# Patient Record
Sex: Female | Born: 1975 | Race: Black or African American | Hispanic: No | Marital: Single | Smoking: Never smoker
Health system: Southern US, Community
[De-identification: ages and names within clinical notes are randomized; demographics above are authoritative.]

## PROBLEM LIST (undated history)

## (undated) DIAGNOSIS — D259 Leiomyoma of uterus, unspecified: Secondary | ICD-10-CM

## (undated) DIAGNOSIS — O24419 Gestational diabetes mellitus in pregnancy, unspecified control: Secondary | ICD-10-CM

## (undated) DIAGNOSIS — K5790 Diverticulosis of intestine, part unspecified, without perforation or abscess without bleeding: Secondary | ICD-10-CM

## (undated) DIAGNOSIS — Z5189 Encounter for other specified aftercare: Secondary | ICD-10-CM

## (undated) DIAGNOSIS — E119 Type 2 diabetes mellitus without complications: Secondary | ICD-10-CM

## (undated) DIAGNOSIS — D649 Anemia, unspecified: Secondary | ICD-10-CM

## (undated) HISTORY — DX: Diverticulosis of intestine, part unspecified, without perforation or abscess without bleeding: K57.90

## (undated) HISTORY — PX: CERVICAL CERCLAGE: SHX1329

## (undated) HISTORY — DX: Type 2 diabetes mellitus without complications: E11.9

## (undated) HISTORY — PX: OTHER SURGICAL HISTORY: SHX169

## (undated) HISTORY — DX: Encounter for other specified aftercare: Z51.89

## (undated) HISTORY — PX: DILATION AND CURETTAGE OF UTERUS: SHX78

## (undated) HISTORY — PX: TOOTH EXTRACTION: SUR596

---

## 1998-02-13 ENCOUNTER — Other Ambulatory Visit: Admission: RE | Admit: 1998-02-13 | Discharge: 1998-02-13 | Payer: Self-pay | Admitting: Obstetrics

## 1999-04-04 ENCOUNTER — Other Ambulatory Visit: Admission: RE | Admit: 1999-04-04 | Discharge: 1999-04-04 | Payer: Self-pay | Admitting: Obstetrics

## 2000-04-13 ENCOUNTER — Other Ambulatory Visit: Admission: RE | Admit: 2000-04-13 | Discharge: 2000-04-13 | Payer: Self-pay | Admitting: Obstetrics

## 2001-07-22 ENCOUNTER — Inpatient Hospital Stay (HOSPITAL_COMMUNITY): Admission: AD | Admit: 2001-07-22 | Discharge: 2001-07-22 | Payer: Self-pay | Admitting: Obstetrics

## 2003-05-21 ENCOUNTER — Inpatient Hospital Stay (HOSPITAL_COMMUNITY): Admission: AD | Admit: 2003-05-21 | Discharge: 2003-05-21 | Payer: Self-pay | Admitting: Obstetrics

## 2003-06-04 ENCOUNTER — Inpatient Hospital Stay (HOSPITAL_COMMUNITY): Admission: AD | Admit: 2003-06-04 | Discharge: 2003-06-04 | Payer: Self-pay | Admitting: Obstetrics

## 2003-06-15 ENCOUNTER — Inpatient Hospital Stay (HOSPITAL_COMMUNITY): Admission: AD | Admit: 2003-06-15 | Discharge: 2003-06-17 | Payer: Self-pay | Admitting: Obstetrics

## 2003-06-30 ENCOUNTER — Inpatient Hospital Stay (HOSPITAL_COMMUNITY): Admission: AD | Admit: 2003-06-30 | Discharge: 2003-07-02 | Payer: Self-pay | Admitting: Obstetrics

## 2003-08-02 ENCOUNTER — Inpatient Hospital Stay (HOSPITAL_COMMUNITY): Admission: AD | Admit: 2003-08-02 | Discharge: 2003-08-07 | Payer: Self-pay | Admitting: Obstetrics

## 2003-08-06 ENCOUNTER — Encounter (INDEPENDENT_AMBULATORY_CARE_PROVIDER_SITE_OTHER): Payer: Self-pay | Admitting: *Deleted

## 2003-09-19 ENCOUNTER — Ambulatory Visit (HOSPITAL_COMMUNITY): Admission: RE | Admit: 2003-09-19 | Discharge: 2003-09-19 | Payer: Self-pay

## 2005-11-06 ENCOUNTER — Emergency Department (HOSPITAL_COMMUNITY): Admission: EM | Admit: 2005-11-06 | Discharge: 2005-11-07 | Payer: Self-pay | Admitting: Emergency Medicine

## 2006-01-18 ENCOUNTER — Ambulatory Visit (HOSPITAL_COMMUNITY): Admission: RE | Admit: 2006-01-18 | Discharge: 2006-01-18 | Payer: Self-pay | Admitting: Obstetrics

## 2006-05-20 ENCOUNTER — Inpatient Hospital Stay (HOSPITAL_COMMUNITY): Admission: AD | Admit: 2006-05-20 | Discharge: 2006-05-20 | Payer: Self-pay | Admitting: Obstetrics and Gynecology

## 2006-06-25 ENCOUNTER — Ambulatory Visit (HOSPITAL_COMMUNITY): Admission: RE | Admit: 2006-06-25 | Discharge: 2006-06-25 | Payer: Self-pay | Admitting: Obstetrics

## 2006-07-01 ENCOUNTER — Emergency Department (HOSPITAL_COMMUNITY): Admission: EM | Admit: 2006-07-01 | Discharge: 2006-07-01 | Payer: Self-pay | Admitting: Emergency Medicine

## 2006-07-13 ENCOUNTER — Ambulatory Visit (HOSPITAL_COMMUNITY): Admission: RE | Admit: 2006-07-13 | Discharge: 2006-07-13 | Payer: Self-pay | Admitting: Obstetrics

## 2006-10-01 ENCOUNTER — Inpatient Hospital Stay (HOSPITAL_COMMUNITY): Admission: AD | Admit: 2006-10-01 | Discharge: 2006-10-04 | Payer: Self-pay | Admitting: Obstetrics

## 2006-12-25 ENCOUNTER — Inpatient Hospital Stay (HOSPITAL_COMMUNITY): Admission: AD | Admit: 2006-12-25 | Discharge: 2006-12-28 | Payer: Self-pay | Admitting: Obstetrics

## 2006-12-25 ENCOUNTER — Encounter (INDEPENDENT_AMBULATORY_CARE_PROVIDER_SITE_OTHER): Payer: Self-pay | Admitting: Obstetrics

## 2007-01-19 ENCOUNTER — Inpatient Hospital Stay (HOSPITAL_COMMUNITY): Admission: AD | Admit: 2007-01-19 | Discharge: 2007-01-19 | Payer: Self-pay | Admitting: Obstetrics

## 2007-04-19 ENCOUNTER — Ambulatory Visit (HOSPITAL_COMMUNITY): Admission: RE | Admit: 2007-04-19 | Discharge: 2007-04-19 | Payer: Self-pay | Admitting: Obstetrics

## 2009-04-22 ENCOUNTER — Emergency Department (HOSPITAL_COMMUNITY): Admission: EM | Admit: 2009-04-22 | Discharge: 2009-04-22 | Payer: Self-pay | Admitting: Family Medicine

## 2010-01-15 ENCOUNTER — Ambulatory Visit (HOSPITAL_COMMUNITY)
Admission: RE | Admit: 2010-01-15 | Discharge: 2010-01-15 | Payer: Self-pay | Source: Home / Self Care | Attending: Obstetrics | Admitting: Obstetrics

## 2010-03-04 ENCOUNTER — Emergency Department (HOSPITAL_COMMUNITY)
Admission: EM | Admit: 2010-03-04 | Discharge: 2010-03-04 | Payer: Self-pay | Source: Home / Self Care | Admitting: Emergency Medicine

## 2010-06-17 NOTE — Discharge Summary (Signed)
[name], [name]               ACCOUNT NO.:  [id number]   MEDICAL RECORD NO.:  [id number]          PATIENT TYPE:  INP   LOCATION:  9101                          [facility]ILITY[facility]  [facility]   PHYSICIAN:  Kathreen Cosier, M.D.DATE OF BIRTH:  1975/08/07   DATE OF ADMISSION:  12/25/2006  DATE OF DISCHARGE:  12/28/2006                               DISCHARGE SUMMARY   HISTORY OF PRESENT ILLNESS:  The patient is a 35 year old gravida 4,  para 1-0-3-1, [facility] January 04, 2007.  She had a cerclage during this  pregnancy and she also had gestational diabetes.  She was on glyburide  2.5 h.s. her sugars were normal.  She was brought in for induction  because of her diabetes.  Cervix was 2-3 cm, 70% and vertex floating.  She was started on low-dose Pitocin and it was noted that she started  having the decelerations that ranged between 80-120.  She was delivered  by C-section.  Her cord pH was 7.06 and her baby weight 6 pounds 14  ounces from the OP position.  Female Apgar 2 and 7.  The placenta was  fundal and sent to pathology.  She had large posterior myoma.  The  patient did well postoperatively.  On day two, her CBG was 119 and she  was discharged home on postoperative day #3, ambulatory, on a regular  diet, to see me in one week for a CBG check.  Her hemoglobin was 10.4.  She was discharged home on the third postoperative day on ferrous  sulfate and Tylox.   DISCHARGE DIAGNOSIS:  Status post primary low transverse cesarean  section because of nonreassuring fetal heart rate tracing.           ______________________________  Kathreen Cosier, M.D.     BAM/MEDQ  D:  01/12/2007  T:  01/12/2007  Job:  [id number]

## 2010-06-17 NOTE — Op Note (Signed)
[name], [name] NO.:  [id number]   MEDICAL RECORD NO.:  [id number]          PATIENT TYPE:  INP   LOCATION:  9101                          [facility]   PHYSICIAN:  Kathreen Cosier, M.D.DATE OF BIRTH:  1975/07/23   DATE OF PROCEDURE:  12/25/2006  DATE OF DISCHARGE:                               OPERATIVE REPORT   PREOPERATIVE DIAGNOSES:  1. Failure to progress in labor.  2. Slow fetal heart rate prior to the C-section.   POSTOPERATIVE DIAGNOSES:  1. Failure to progress in labor.  2. Slow fetal heart rate prior to the C-section.  3. Large posterior myoma.   SURGEON:  Kathreen Cosier, M.D.   ANESTHESIA:  Epidural.   PROCEDURE:  The patient placed on the operating table in supine  position.  Abdomen prepped and draped.  Bladder emptied with Foley  catheter.  Transverse suprapubic incision made, carried down through  rectus fascia.  Fascia cleanly incised length of incision.  The recti  muscles retracted laterally.  Peritoneum incised longitudinally.  Transverse incision made in the visceral peritoneum above the bladder.  Bladder mobilized inferiorly.  Transverse lower uterine incision made.  The patient delivered from the LOA position of a female, Apgar 2 and 7,  weighing 6 pounds 14 ounces, cord pH of 7.06.  The team was in  attendance.  The fluid was clear.  The placenta was fundal, removed  manually and sent to pathology.  There was a large posterior myoma  impinging on the uterine cavity.  Uterine cavity cleaned with dry laps.  Uterine incision closed in one layer with continuous suture of #1  chromic.  Hemostasis satisfactory.  Bladder flap reattached with 2-0  chromic.  Uterus well contracted.  Tubes and ovaries normal.  Abdomen  closed in layers, peritoneum with continuous suture of O chromic.  Fascia with continuous suture of #1 Dexon and skin closed with  subcuticular stitch of 4-0 Monocryl.     ______________________________  Kathreen Cosier, M.D.     BAM/MEDQ  D:  12/25/2006  T:  12/27/2006  Job:  [id number]

## 2010-06-17 NOTE — H&P (Signed)
[name], [name] NO.:  [id number]   MEDICAL RECORD NO.:  [id number]          PATIENT TYPE:  INP   LOCATION:  9101                          [facility]   PHYSICIAN:  Kathreen Cosier, M.D.DATE OF BIRTH:  1975-04-01   DATE OF ADMISSION:  12/25/2006  DATE OF DISCHARGE:                              HISTORY & PHYSICAL   The patient is a 35 year old gravida 4, para 1-0-3-1, [facility] January 04, 2007.  She had a cerclage with this pregnancy ,and this was removed at  34 weeks.  She is also a gestational diabetic on glyburide 2.5, and her  sugars were normal.  She was brought in for induction, because of her  diabetes, cervix 2 to 3 cm, 70% with a vertex floating at 9:50 a.m.  She  was started on low-dose Pitocin, and at mid-day, she spontaneously  ruptured her membranes, and at 1:30 p.m. she was 4 cm, 80% vertex minus,  2 to -3 station.  IUPC was inserted that time, and she was continued on  Pitocin.  By 3:30 p.m. she was 7 cm, 100% vertex -2, with a compound  presentation with the arm presenting beside, on the head of the head.  The vertex was at zero station.  By 5:10 p.m. she started having some  variables with contractions which were mild.  She was started on amnio  infusion.  By 8:30 p.m. after having been 7 cm for 5 hours, in adequate  labor, it was decided she would be delivered by C-section for failure to  progress in labor, the fetal heart which was running a normal baseline  of 140.  When she was taken back to the operating room, dropped to 80  beats per minute and then resumed to 120 and then dropped again to 80  prior to the C-section.   PHYSICAL EXAM:  GENERAL:  Revealed a well-developed female in labor.  HEENT:  Negative.  LUNGS:  Clear.  HEART:  Regular rhythm.  No murmurs, no gallops.  BREASTS:  No masses.  ABDOMEN:  Current estimated fetal weight 7 pounds.  PELVIC:  As described above.  EXTREMITIES:  Negative.     ______________________________  Kathreen Cosier, M.D.     BAM/MEDQ  D:  12/25/2006  T:  12/26/2006  Job:  [id number]

## 2010-06-17 NOTE — H&P (Signed)
[name], [name]               ACCOUNT NO.:  [id number]   MEDICAL RECORD NO.:  [id number]          PATIENT TYPE:  INP   LOCATION:  9159                          [facility]  [facility]   PHYSICIAN:  Roseanna Rainbow, M.D.DATE OF BIRTH:  04-09-1975   DATE OF ADMISSION:  10/01/2006  DATE OF DISCHARGE:                              HISTORY & PHYSICAL   CHIEF COMPLAINT:  The patient is a 35 year old gravida 4, para 1-0-2-1  with an estimated date of confinement 12/3, with an intrauterine  pregnancy at 26+ weeks complaining of uterine contractions.   HISTORY OF PRESENT ILLNESS:  Please see the above. The patient had a  prophylactic cerclage placed in the second trimester of pregnancy. OB  risk factors: Cervical insufficiency, history of second-trimester lost  twin gestation with PPROM at 62 weeks' gestation. Fibroid uterus.  Gestational diabetes, diet controlled. The patient reports urinary  frequency, but she denies urgency or dysuria. She also denies abnormal  vaginal discharge or leakage of fluid. She denies any vaginal bleeding.  She reports compliance with pelvic rest.   PAST OBSTETRICAL HISTORY:  In 1994, there was an early spontaneous  abortion, in 1998, she was delivered of a female 6 pounds 7 ounces. Had  at 39 weeks vaginal delivery, no complications. In 2005, there was a  twin gestation complicated by PPROM and pre-term delivery at 21 weeks.   PAST GYN HISTORY:  Noncontributory.   PAST MEDICAL HISTORY:  Broken shoulder.   PAST SURGICAL HISTORY:  She denies.   SOCIAL HISTORY:  No tobacco, ethanol or drug use.   PHYSICAL EXAMINATION:  VITAL SIGNS: Stable, afebrile. Fetal heart  tracing reassuring. Tocodynamometer. Uterine contraction every 2  minutes.  PELVIC: On speculum exam the cervix appears closed and long. There is  scant white discharge. GC, Chlamydia, DNA probes were sent as well as a  wet prep. On digital exam the cervix feels closed and long. There is no  significant lower-uterine segment development.   ASSESSMENT:  Intrauterine pregnancy at 26+ weeks with cervical  insufficiency, a cerclage in situ with pre-term contractions, poor  obstetrical history, gestational diabetes diet controlled.   PLAN:  1. Admission.  2. Tocolysis.  3. Ultrasound.  4. Bed rest.  5. Betamethasone.  6. Parenteral antibiotics.  7. ADA diet.      Roseanna Rainbow, M.D.  Electronically Signed     LAJ/MEDQ  D:  10/01/2006  T:  10/01/2006  Job:  [id number]

## 2010-06-17 NOTE — Op Note (Signed)
[name], [name]               ACCOUNT NO.:  [id number]   MEDICAL RECORD NO.:  [id number]          PATIENT TYPE:  AMB   LOCATION:  SDC                           [facility]   PHYSICIAN:  Kathreen Cosier, M.D.DATE OF BIRTH:  1975-06-24   DATE OF PROCEDURE:  DATE OF DISCHARGE:                               OPERATIVE REPORT   PREOPERATIVE DIAGNOSIS:  Incompetent cervix.   PROCEDURE:  Cervical cerclage.   DESCRIPTION OF PROCEDURE:  After the spinal administered with the  patient lithotomy position, perineum and vagina prepped and draped,  bladder emptied with straight catheter.  Weighted speculum placed in the  vagina. Starting at 12 o'clock a #5 Mersilene band was used to place  McDonald cerclage.  This was done without difficulty and tied at 6  o'clock. The patient tolerated procedure well and was taken to recovery  room in good condition.           ______________________________  Kathreen Cosier, M.D.     BAM/MEDQ  D:  07/13/2006  T:  07/13/2006  Job:  [id number]

## 2010-06-19 ENCOUNTER — Other Ambulatory Visit (HOSPITAL_COMMUNITY): Payer: Self-pay | Admitting: Obstetrics

## 2010-06-20 NOTE — Discharge Summary (Signed)
NAMEROSHAWN, LACINA                         ACCOUNT NO.:  [id number]   MEDICAL RECORD NO.:  [id number]                   PATIENT TYPE:  INP   LOCATION:  9302                                 [facility]   PHYSICIAN:  Kathreen Cosier, M.D.           DATE OF BIRTH:  Mar 13, 1975   DATE OF ADMISSION:  08/02/2003  DATE OF DISCHARGE:                                 DISCHARGE SUMMARY   HOSPITAL COURSE:  The patient is a 35 year old gravida 3 para 1-0-1-1 with  [facility] December 14, 2003 pregnant with twins. The patient states that in day  prior to admission she had a gush of fluid and fluid was running down her  legs.  On admission pelvic exam, speculum exam revealed no fluid in the  vagina.  The cervix was 2 cm, 80%, and twin A was -3 station.  Ultrasound  revealed normal fluid around twin B and no fluid around twin A.  She was [redacted]  weeks pregnant, kept on bedrest.  She passed twin A on the morning of July 2  and the baby was nonviable, weight 435 g; it was a female.  She was kept on  bedrest with the explanation to not deliver twin B and try to see if twin B  would stay intrauterine until viable.  However, on the night of July 3 she  started contracting and started leaking fluid at 4:57 a.m. on July 4 and had  a normal delivery at 5:18 a.m., Apgars 0, of a female.  It was foul smelling.  The placentas delivered at 5:50 a.m. and sent to pathology.  The patient was  seen in consult by the psychiatrist on July 4 who felt that she was  depressed because of lack of support system.  He recommended Lexapro.  When  the patient was seen on July 5, postpartum day #1, hemoglobin was 10.6 and  white count 10.3.  The patient did not want to take Lexapro but she wanted  to try some Xanax.  She was discharged home on Cleocin 300 p.o. q.6h. for 7  days, Tylenol No. 3 for pain, and Xanax 0.5 t.i.d. p.r.n.   DISCHARGE DIAGNOSES:  1. Status post second trimester spontaneous abortion of twin gestation.  2.  Premature ruptured membranes.                                               Kathreen Cosier, M.D.    BAM/MEDQ  D:  08/07/2003  T:  08/07/2003  Job:  [id number]

## 2010-06-20 NOTE — Discharge Summary (Signed)
NAMEVAN, EHLERT               ACCOUNT NO.:  [id number]   MEDICAL RECORD NO.:  [id number]          PATIENT TYPE:  INP   LOCATION:  9159                          [facility]   PHYSICIAN:  Kathreen Cosier, M.D.DATE OF BIRTH:  02/11/1975   DATE OF ADMISSION:  10/01/2006  DATE OF DISCHARGE:  10/04/2006                               DISCHARGE SUMMARY   The patient is a 35 year old female at [redacted] weeks pregnant, [facility] January 04, 2007.  She was admitted because of premature contractions.   HOSPITAL COURSE:  She was given magnesium sulfate 4 gram loading and 2  grams per hour.  She was also diabetic, and was on Glyburide 2.5 mg  daily.  She did well and stopped contracting and was discharged home on  September 1, to see me in two weeks.   DISCHARGE DIAGNOSIS:  Status post premature contractions at 26 weeks.           ______________________________  Kathreen Cosier, M.D.     BAM/MEDQ  D:  11/03/2006  T:  11/03/2006  Job:  [id number]

## 2010-06-20 NOTE — Discharge Summary (Signed)
[name], [name]                           ACCOUNT NO.:  [id number]   MEDICAL RECORD NO.:  [id number]                   PATIENT TYPE:  INP   LOCATION:  9309                                 [facility]   PHYSICIAN:  Charles A. Clearance Coots, M.D.             DATE OF BIRTH:  08-Feb-1975   DATE OF ADMISSION:  06/15/2003  DATE OF DISCHARGE:  06/17/2003                                 DISCHARGE SUMMARY   ADMITTING DIAGNOSES:  1. Fourteen weeks gestation.  2. Lower abdominal pain.  3. Twins.   DISCHARGE DIAGNOSES:  1. Fourteen weeks gestation.  2. Lower abdominal pain.  3. Twins.  4. Status post bedrest and observation.  5. Discharged home undelivered at [redacted] weeks gestation much improved in good     condition.   REASON FOR ADMISSION:  A 35 year old G3 P1 black female at [redacted] weeks  gestation with twins complained of lower abdominal bilateral inguinal pains  and vomiting usually after taking her vitamins.  She had no significant  nausea.   PAST MEDICAL HISTORY:  Surgery:  None.  Illnesses:  None.   MEDICATIONS:  Prenatal vitamins.   ALLERGIES:  No known drug allergies.   SOCIAL HISTORY:  Single.  Negative tobacco, alcohol, or recreational drug  use.   FAMILY HISTORY:  Noncontributory.   PHYSICAL EXAMINATION:  GENERAL:  Well-nourished, well-developed black female  in no acute distress.  VITAL SIGNS:  Temperature 98.2, pulse 96, respiratory rate 18, blood  pressure 114/82.  HEENT:  Normal.  LUNGS:  Clear to auscultation bilaterally.  HEART:  Regular rate and rhythm.  ABDOMEN:  Soft, nontender.  PELVIC:  Cervix long, closed.   ADMITTING LABORATORY VALUES:  Hemoglobin 13.3, hematocrit 39.7.  Comprehensive metabolic panel was within normal limits except for a slightly  elevated AST of 72.  Urinalysis revealed a specific gravity of 1.020.  Wet  prep revealed moderate white blood cells.   HOSPITAL COURSE:  The patient was admitted and started on IV antibiotic  therapy and  supportive management.  She responded quite well to therapy and  by hospital day  #2 was feeling much better and was therefore discharged home.   DISCHARGE DISPOSITION:  1. Medications:  Continue prenatal vitamins, clindamycin 300 mg twice a day     for 7 days was prescribed.  2. Routine written obstetrical discharge instructions were given.  3. The patient is to follow up with Dr. Gaynell Face in the office on the     following Tuesday.                                               Charles A. Clearance Coots, M.D.    CAH/MEDQ  D:  06/22/2003  T:  06/22/2003  Job:  [id number]

## 2010-06-25 ENCOUNTER — Ambulatory Visit (HOSPITAL_COMMUNITY)
Admission: RE | Admit: 2010-06-25 | Discharge: 2010-06-25 | Disposition: A | Discharge status: Home / Self Care | Payer: Medicaid Other | Source: Ambulatory Visit | Attending: Obstetrics | Admitting: Obstetrics

## 2010-06-25 ENCOUNTER — Other Ambulatory Visit (HOSPITAL_COMMUNITY): Payer: Self-pay

## 2010-06-25 DIAGNOSIS — D251 Intramural leiomyoma of uterus: Secondary | ICD-10-CM | POA: Insufficient documentation

## 2010-06-25 DIAGNOSIS — D250 Submucous leiomyoma of uterus: Secondary | ICD-10-CM | POA: Insufficient documentation

## 2010-11-11 LAB — CBC
HCT: 29.8 — ABNORMAL LOW
HCT: 38.3
Hemoglobin: 10.4 — ABNORMAL LOW
Hemoglobin: 13.3
MCHC: 34.8
MCHC: 35
MCV: 89.8
MCV: 90.3
Platelets: 150
Platelets: 178
RBC: 3.3 — ABNORMAL LOW
RBC: 4.27
RDW: 14.6
RDW: 14.9
WBC: 5.1
WBC: 9.7

## 2010-11-11 LAB — RPR: RPR Ser Ql: NONREACTIVE

## 2010-11-14 LAB — CBC
HCT: 36.6
Hemoglobin: 12.6
MCHC: 34.3
MCV: 87.8
Platelets: 190
RBC: 4.17
RDW: 15.6 — ABNORMAL HIGH
WBC: 8.9

## 2010-11-14 LAB — URINALYSIS, DIPSTICK ONLY
Bilirubin Urine: NEGATIVE
Glucose, UA: NEGATIVE
Hgb urine dipstick: NEGATIVE
Ketones, ur: >80 — AB
Leukocytes, UA: NEGATIVE
Nitrite: NEGATIVE
Protein, ur: NEGATIVE
Specific Gravity, Urine: 1.02
Urobilinogen, UA: 0.2
pH: 6

## 2010-11-14 LAB — WET PREP, GENITAL
Clue Cells Wet Prep HPF POC: NONE SEEN
Trich, Wet Prep: NONE SEEN
Yeast Wet Prep HPF POC: NONE SEEN

## 2010-11-14 LAB — GC/CHLAMYDIA PROBE AMP, GENITAL
Chlamydia, DNA Probe: NEGATIVE
GC Probe Amp, Genital: NEGATIVE

## 2010-11-14 LAB — RPR: RPR Ser Ql: NONREACTIVE

## 2010-11-20 LAB — CBC
HCT: 33.3 — ABNORMAL LOW
Hemoglobin: 10.9 — ABNORMAL LOW
MCHC: 32.6
MCV: 81.7
Platelets: 214
RBC: 4.08
RDW: 19.4 — ABNORMAL HIGH
WBC: 6.4

## 2011-02-22 ENCOUNTER — Encounter (HOSPITAL_COMMUNITY): Payer: Self-pay | Admitting: *Deleted

## 2011-02-22 ENCOUNTER — Emergency Department (INDEPENDENT_AMBULATORY_CARE_PROVIDER_SITE_OTHER)
Admission: EM | Admit: 2011-02-22 | Discharge: 2011-02-22 | Disposition: A | Discharge status: Home / Self Care | Payer: Self-pay | Source: Home / Self Care

## 2011-02-22 DIAGNOSIS — J069 Acute upper respiratory infection, unspecified: Secondary | ICD-10-CM

## 2011-02-22 MED ORDER — SODIUM CHLORIDE 0.65 % NA SOLN: 1.0000 | NASAL | Status: DC | PRN

## 2011-02-22 NOTE — ED Provider Notes (Signed)
History     CSN: [id number]  Arrival date & time 02/22/11  1318   None     Chief Complaint  Patient presents with  . URI    (Consider location/radiation/quality/duration/timing/severity/associated sxs/prior treatment) HPI Comments: Mother here with 2 daughters with same sx for a week.   Patient is a 36 y.o. female presenting with URI. The history is provided by the patient.  URI The primary symptoms include headaches, sore throat and cough. Primary symptoms do not include fever, ear pain or wheezing. The current episode started 6 to 7 days ago. This is a new problem. The problem has been gradually improving.  The headache began more than 2 days ago.  The sore throat began more than 2 days ago. The sore throat has been gradually improving since its onset. The sore throat is mild in intensity.  The cough began 6 to 7 days ago. The cough is non-productive.  Symptoms associated with the illness include sinus pressure, congestion and rhinorrhea. The illness is not associated with chills.    Past Medical History  Diagnosis Date  . Asthma     Past Surgical History  Procedure Date  . Arm surgery   . Dilation and curettage of uterus   . Cesarean section     No family history on file.  History  Substance Use Topics  . Smoking status: Never Smoker   . Smokeless tobacco: Not on file  . Alcohol Use: No    OB History    Grav Para Term Preterm Abortions TAB SAB Ect Mult Living                  Review of Systems  Constitutional: Negative for fever and chills.  HENT: Positive for congestion, sore throat, rhinorrhea, postnasal drip and sinus pressure. Negative for ear pain.   Respiratory: Positive for cough. Negative for wheezing.   Cardiovascular: Negative for chest pain.  Neurological: Positive for headaches.    Allergies  Review of patient's allergies indicates no known allergies.  Home Medications   Current Outpatient Rx  Name Route Sig Dispense Refill  .  ALBUTEROL IN Inhalation Inhale into the lungs as needed.    . SODIUM CHLORIDE 0.65 % NA SOLN Nasal Place 1 spray into the nose as needed for congestion. 15 mL 12    BP 114/74  Pulse 77  Temp(Src) 98.9 F (37.2 C) (Oral)  Resp 16  SpO2 100%  LMP 02/15/2011  Physical Exam  Constitutional: She appears well-developed and well-nourished. No distress.  HENT:  Right Ear: Tympanic membrane, external ear and ear canal normal.  Left Ear: Tympanic membrane, external ear and ear canal normal.  Nose: Mucosal edema and rhinorrhea present.  Mouth/Throat: Oropharynx is clear and moist.  Cardiovascular: Normal rate and regular rhythm.   Pulmonary/Chest: Effort normal and breath sounds normal.    ED Course  Procedures (including critical care time)  Labs Reviewed - No data to display No results found.   1. Upper respiratory infection       MDM          Cathlyn Parsons, NP 02/22/11 2035

## 2011-02-22 NOTE — ED Notes (Signed)
C/O scratchy throat, runny nose, productive cough x 1.5 wks.  States has improved since last week.  Has been taking Delsym and using cough drops.

## 2011-02-24 NOTE — ED Provider Notes (Signed)
Medical screening examination/treatment/procedure(s) were performed by non-physician practitioner and as supervising physician I was immediately available for consultation/collaboration.   Jyles Sontag DOUGLAS MD.    Neomi Laidler Douglas Ikeya Brockel, MD 02/24/11 0815 

## 2012-03-29 ENCOUNTER — Inpatient Hospital Stay (HOSPITAL_COMMUNITY)
Admission: AD | Admit: 2012-03-29 | Discharge: 2012-03-29 | Disposition: A | Discharge status: Home / Self Care | Payer: Self-pay | Source: Ambulatory Visit | Attending: Obstetrics | Admitting: Obstetrics

## 2012-04-22 ENCOUNTER — Emergency Department (HOSPITAL_COMMUNITY)
Admission: EM | Admit: 2012-04-22 | Discharge: 2012-04-22 | Discharge status: Against Medical Advice | Payer: Self-pay | Attending: Emergency Medicine | Admitting: Emergency Medicine

## 2012-04-22 ENCOUNTER — Encounter (HOSPITAL_COMMUNITY): Payer: Self-pay | Admitting: Emergency Medicine

## 2012-04-22 DIAGNOSIS — J45909 Unspecified asthma, uncomplicated: Secondary | ICD-10-CM | POA: Insufficient documentation

## 2012-04-22 DIAGNOSIS — R109 Unspecified abdominal pain: Secondary | ICD-10-CM | POA: Insufficient documentation

## 2012-04-22 LAB — COMPREHENSIVE METABOLIC PANEL
ALT: 9 U/L (ref 0–35)
AST: 16 U/L (ref 0–37)
Albumin: 3.9 g/dL (ref 3.5–5.2)
Alkaline Phosphatase: 37 U/L — ABNORMAL LOW (ref 39–117)
BUN: 10 mg/dL (ref 6–23)
CO2: 24 mEq/L (ref 19–32)
Calcium: 9.3 mg/dL (ref 8.4–10.5)
Chloride: 106 mEq/L (ref 96–112)
Creatinine, Ser: 0.8 mg/dL (ref 0.50–1.10)
GFR calc Af Amer: >90 mL/min (ref >90)
GFR calc non Af Amer: >90 mL/min (ref >90)
Glucose, Bld: 87 mg/dL (ref 70–99)
Potassium: 3.3 mEq/L — ABNORMAL LOW (ref 3.5–5.1)
Sodium: 142 mEq/L (ref 135–145)
Total Bilirubin: 1 mg/dL (ref 0.3–1.2)
Total Protein: 7 g/dL (ref 6.0–8.3)

## 2012-04-22 LAB — CBC WITH DIFFERENTIAL/PLATELET
Basophils Absolute: 0 10*3/uL (ref 0.0–0.1)
Basophils Relative: 1 % (ref 0–1)
Eosinophils Absolute: 0.1 10*3/uL (ref 0.0–0.7)
Eosinophils Relative: 3 % (ref 0–5)
HCT: 23.8 % — ABNORMAL LOW (ref 36.0–46.0)
Hemoglobin: 6.7 g/dL — CL (ref 12.0–15.0)
Lymphocytes Relative: 49 % — ABNORMAL HIGH (ref 12–46)
Lymphs Abs: 2 10*3/uL (ref 0.7–4.0)
MCH: 18 pg — ABNORMAL LOW (ref 26.0–34.0)
MCHC: 28.2 g/dL — ABNORMAL LOW (ref 30.0–36.0)
MCV: 63.8 fL — ABNORMAL LOW (ref 78.0–100.0)
Monocytes Absolute: 0.2 10*3/uL (ref 0.1–1.0)
Monocytes Relative: 6 % (ref 3–12)
Neutro Abs: 1.6 10*3/uL — ABNORMAL LOW (ref 1.7–7.7)
Neutrophils Relative %: 41 % — ABNORMAL LOW (ref 43–77)
Platelets: 216 10*3/uL (ref 150–400)
RBC: 3.73 MIL/uL — ABNORMAL LOW (ref 3.87–5.11)
RDW: 18.1 % — ABNORMAL HIGH (ref 11.5–15.5)
WBC: 3.9 10*3/uL — ABNORMAL LOW (ref 4.0–10.5)

## 2012-04-22 NOTE — ED Notes (Signed)
Lab called to report critical Hgb of 6.7, pt no longer in department

## 2012-04-22 NOTE — ED Notes (Signed)
PT. REPORTS INTERMITTENT RIGHT ABDOMINAL PAIN FOR SEVERAL DAYS , DENIES NAUSEA/VOMITTING OR DIARRHEA .

## 2012-05-02 ENCOUNTER — Encounter (HOSPITAL_COMMUNITY): Payer: Self-pay

## 2012-05-02 ENCOUNTER — Inpatient Hospital Stay (HOSPITAL_COMMUNITY)
Admission: AD | Admit: 2012-05-02 | Discharge: 2012-05-02 | Disposition: A | Discharge status: Home / Self Care | Payer: Self-pay | Source: Ambulatory Visit | Attending: Obstetrics | Admitting: Obstetrics

## 2012-05-02 DIAGNOSIS — R1031 Right lower quadrant pain: Secondary | ICD-10-CM | POA: Insufficient documentation

## 2012-05-02 DIAGNOSIS — B9689 Other specified bacterial agents as the cause of diseases classified elsewhere: Secondary | ICD-10-CM | POA: Insufficient documentation

## 2012-05-02 DIAGNOSIS — N949 Unspecified condition associated with female genital organs and menstrual cycle: Secondary | ICD-10-CM | POA: Insufficient documentation

## 2012-05-02 DIAGNOSIS — A499 Bacterial infection, unspecified: Secondary | ICD-10-CM | POA: Insufficient documentation

## 2012-05-02 DIAGNOSIS — N390 Urinary tract infection, site not specified: Secondary | ICD-10-CM | POA: Insufficient documentation

## 2012-05-02 DIAGNOSIS — N760 Acute vaginitis: Secondary | ICD-10-CM | POA: Insufficient documentation

## 2012-05-02 HISTORY — DX: Gestational diabetes mellitus in pregnancy, unspecified control: O24.419

## 2012-05-02 HISTORY — DX: Leiomyoma of uterus, unspecified: D25.9

## 2012-05-02 LAB — URINE MICROSCOPIC-ADD ON

## 2012-05-02 LAB — URINALYSIS, ROUTINE W REFLEX MICROSCOPIC
Bilirubin Urine: NEGATIVE
Glucose, UA: NEGATIVE mg/dL
Hgb urine dipstick: NEGATIVE
Ketones, ur: NEGATIVE mg/dL
Nitrite: POSITIVE — AB
Protein, ur: 30 mg/dL — AB
Specific Gravity, Urine: >1.03 — ABNORMAL HIGH (ref 1.005–1.030)
Urobilinogen, UA: 1 mg/dL (ref 0.0–1.0)
pH: 6 (ref 5.0–8.0)

## 2012-05-02 LAB — GC/CHLAMYDIA PROBE AMP
CT Probe RNA: NEGATIVE
GC Probe RNA: NEGATIVE

## 2012-05-02 LAB — WET PREP, GENITAL
Trich, Wet Prep: NONE SEEN
Yeast Wet Prep HPF POC: NONE SEEN

## 2012-05-02 LAB — POCT PREGNANCY, URINE: Preg Test, Ur: NEGATIVE

## 2012-05-02 MED ORDER — METRONIDAZOLE 500 MG PO TABS: 500.0000 mg | ORAL_TABLET | Freq: Two times a day (BID) | ORAL | Status: DC

## 2012-05-02 MED ORDER — SULFAMETHOXAZOLE-TRIMETHOPRIM 800-160 MG PO TABS: 1.0000 | ORAL_TABLET | Freq: Two times a day (BID) | ORAL | Status: DC

## 2012-05-02 NOTE — MAU Note (Signed)
Right side/abdominal pain, comes & goes, not sure when it happens or when it started. Vaginal discharge off & on, doesn't know when started, looks yellow on panty liners. Strong smelling urine for a few weeks.

## 2012-05-02 NOTE — MAU Provider Note (Signed)
History     CSN: [id number]  Arrival date and time: 05/02/12 0211   None     Chief Complaint  Patient presents with  . Abdominal Pain  . Vaginal Discharge   HPI This is a 37 y.o. female who presents with c/o vague RLQ pain for a few weeks. No bleeding abnormalities. Has seen Dr Gaynell Face within the year but has not told him about this problem. Has some suspicion she might have a UTI.  Vaginal discharge off and on. Known hx small fibroids.   RN Note: Right side/abdominal pain, comes & goes, not sure when it happens or when it started. Vaginal discharge off & on, doesn't know when started, looks yellow on panty liners. Strong smelling urine for a few weeks.       OB History   Grav Para Term Preterm Abortions TAB SAB Ect Mult Living                  Past Medical History  Diagnosis Date  . Asthma     Past Surgical History  Procedure Laterality Date  . Arm surgery    . Dilation and curettage of uterus    . Cesarean section      No family history on file.  History  Substance Use Topics  . Smoking status: Never Smoker   . Smokeless tobacco: Not on file  . Alcohol Use: No    Allergies:  Allergies  Allergen Reactions  . Shellfish Allergy Itching and Swelling    Throat swells   . Other Itching and Swelling    Hazelnut    Prescriptions prior to admission  Medication Sig Dispense Refill  . ALBUTEROL IN Inhale into the lungs as needed.      . sodium chloride (OCEAN) 0.65 % nasal spray Place 1 spray into the nose as needed for congestion.  15 mL  12    Review of Systems  Constitutional: Negative for fever and malaise/fatigue.  Gastrointestinal: Positive for abdominal pain. Negative for nausea, vomiting, diarrhea and constipation.  Genitourinary: Negative for dysuria.       Strong smell to urine    Physical Exam   Blood pressure 129/70, pulse 79, temperature 98.6 F (37 C), temperature source Oral, resp. rate 16, height 5' 4.5" (1.638 m), weight 167 lb 9.6  oz (76.023 kg), last menstrual period 04/19/2012.  Physical Exam  Constitutional: She is oriented to person, place, and time. She appears well-developed and well-nourished. No distress.  HENT:  Head: Normocephalic.  Cardiovascular: Normal rate.   Respiratory: Effort normal.  GI: Soft. She exhibits no distension and no mass. There is tenderness (slight tenderness to lower pelvis). There is no rebound and no guarding.  Genitourinary: Vaginal discharge (frothy white/yellow) found.  Uterus small, irregular, c/w fibroids Cervix long/closed  Musculoskeletal: Normal range of motion. She exhibits no edema.  Neurological: She is alert and oriented to person, place, and time.  Skin: Skin is warm and dry.  Psychiatric: She has a normal mood and affect.    MAU Course  Procedures  MDM GC/Chlamydia, Wet prep, UA. Results for orders placed during the hospital encounter of 05/02/12 (from the past 72 hour(s))  URINALYSIS, ROUTINE W REFLEX MICROSCOPIC     Status: Abnormal   Collection Time    05/02/12  2:21 AM      Result Value Range   Color, Urine YELLOW  YELLOW   APPearance HAZY (*) CLEAR   Specific Gravity, Urine >1.030 (*) 1.005 - 1.030  pH 6.0  5.0 - 8.0   Glucose, UA NEGATIVE  NEGATIVE mg/dL   Hgb urine dipstick NEGATIVE  NEGATIVE   Bilirubin Urine NEGATIVE  NEGATIVE   Ketones, ur NEGATIVE  NEGATIVE mg/dL   Protein, ur 30 (*) NEGATIVE mg/dL   Urobilinogen, UA 1.0  0.0 - 1.0 mg/dL   Nitrite POSITIVE (*) NEGATIVE   Leukocytes, UA SMALL (*) NEGATIVE  URINE MICROSCOPIC-ADD ON     Status: Abnormal   Collection Time    05/02/12  2:21 AM      Result Value Range   Squamous Epithelial / LPF RARE  RARE   WBC, UA 3-6  <3 WBC/hpf   RBC / HPF 7-10  <3 RBC/hpf   Bacteria, UA FEW (*) RARE  POCT PREGNANCY, URINE     Status: None   Collection Time    05/02/12  2:30 AM      Result Value Range   Preg Test, Ur NEGATIVE  NEGATIVE   Comment:            THE SENSITIVITY OF THIS     METHODOLOGY IS  >24 mIU/mL  WET PREP, GENITAL     Status: Abnormal   Collection Time    05/02/12  3:05 AM      Result Value Range   Yeast Wet Prep HPF POC NONE SEEN  NONE SEEN   Trich, Wet Prep NONE SEEN  NONE SEEN   Clue Cells Wet Prep HPF POC MANY (*) NONE SEEN   WBC, Wet Prep HPF POC FEW (*) NONE SEEN   Comment: MANY BACTERIA SEEN    Assessment and Plan  A:  Pelvic Pain, probably due to UTI      Probable UTI      BV  P:  Discharge home       Rx Septra DS       Rx Flagyl      Follow up with Dr Raynelle Jan 05/02/2012, 2:41 AM

## 2012-05-03 LAB — URINE CULTURE: Colony Count: >=100000

## 2013-11-15 ENCOUNTER — Encounter (HOSPITAL_COMMUNITY): Payer: Self-pay | Admitting: Emergency Medicine

## 2013-11-15 ENCOUNTER — Emergency Department (HOSPITAL_COMMUNITY)
Admission: EM | Admit: 2013-11-15 | Discharge: 2013-11-15 | Disposition: A | Discharge status: Home / Self Care | Payer: Medicaid Other | Attending: Emergency Medicine | Admitting: Emergency Medicine

## 2013-11-15 ENCOUNTER — Emergency Department (HOSPITAL_COMMUNITY): Payer: Medicaid Other

## 2013-11-15 DIAGNOSIS — J45909 Unspecified asthma, uncomplicated: Secondary | ICD-10-CM | POA: Insufficient documentation

## 2013-11-15 DIAGNOSIS — S6991XA Unspecified injury of right wrist, hand and finger(s), initial encounter: Secondary | ICD-10-CM | POA: Diagnosis not present

## 2013-11-15 DIAGNOSIS — R55 Syncope and collapse: Secondary | ICD-10-CM | POA: Diagnosis not present

## 2013-11-15 DIAGNOSIS — S0093XA Contusion of unspecified part of head, initial encounter: Secondary | ICD-10-CM | POA: Insufficient documentation

## 2013-11-15 DIAGNOSIS — T07XXXA Unspecified multiple injuries, initial encounter: Secondary | ICD-10-CM

## 2013-11-15 DIAGNOSIS — Z79899 Other long term (current) drug therapy: Secondary | ICD-10-CM | POA: Insufficient documentation

## 2013-11-15 DIAGNOSIS — Z8742 Personal history of other diseases of the female genital tract: Secondary | ICD-10-CM | POA: Diagnosis not present

## 2013-11-15 DIAGNOSIS — S1091XA Abrasion of unspecified part of neck, initial encounter: Secondary | ICD-10-CM | POA: Insufficient documentation

## 2013-11-15 LAB — CYTOLOGY - PAP

## 2013-11-15 MED ORDER — HYDROCODONE-ACETAMINOPHEN 5-325 MG PO TABS
1.0000 | ORAL_TABLET | Freq: Once | ORAL | Status: AC
  Administered 2013-11-15: 1 via ORAL
  Filled 2013-11-15: qty 1

## 2013-11-15 MED ORDER — HYDROCODONE-ACETAMINOPHEN 5-325 MG PO TABS: 1.0000 | ORAL_TABLET | ORAL | Status: DC | PRN

## 2013-11-15 NOTE — ED Notes (Signed)
Declined W/C at D/C and was escorted to lobby by RN. 

## 2013-11-15 NOTE — Progress Notes (Signed)
Orthopedic Tech Progress Note Patient Details:  [name] 26-Feb-1975 [id number]  Ortho Devices Type of Ortho Device: Finger splint Ortho Device/Splint Location: rue Ortho Device/Splint Interventions: Application   Hildred Priest 11/15/2013, 10:32 PM
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
## 2013-11-15 NOTE — Discharge Instructions (Signed)
Abrasion An abrasion is a cut or scrape of the skin. Abrasions do not extend through all layers of the skin and most heal within 10 days. It is important to care for your abrasion properly to prevent infection. CAUSES  Most abrasions are caused by falling on, or gliding across, the ground or other surface. When your skin rubs on something, the outer and inner layer of skin rubs off, causing an abrasion. DIAGNOSIS  Your caregiver will be able to diagnose an abrasion during a physical exam.  TREATMENT  Your treatment depends on how large and deep the abrasion is. Generally, your abrasion will be cleaned with water and a mild soap to remove any dirt or debris. An antibiotic ointment may be put over the abrasion to prevent an infection. A bandage (dressing) may be wrapped around the abrasion to keep it from getting dirty.  You may need a tetanus shot if:  You cannot remember when you had your last tetanus shot.  You have never had a tetanus shot.  The injury broke your skin. If you get a tetanus shot, your arm may swell, get red, and feel warm to the touch. This is common and not a problem. If you need a tetanus shot and you choose not to have one, there is a rare chance of getting tetanus. Sickness from tetanus can be serious.  HOME CARE INSTRUCTIONS   If a dressing was applied, change it at least once a day or as directed by your caregiver. If the bandage sticks, soak it off with warm water.   Wash the area with water and a mild soap to remove all the ointment 2 times a day. Rinse off the soap and pat the area dry with a clean towel.   Reapply any ointment as directed by your caregiver. This will help prevent infection and keep the bandage from sticking. Use gauze over the wound and under the dressing to help keep the bandage from sticking.   Change your dressing right away if it becomes wet or dirty.   Only take over-the-counter or prescription medicines for pain, discomfort, or fever as  directed by your caregiver.   Follow up with your caregiver within 24-48 hours for a wound check, or as directed. If you were not given a wound-check appointment, look closely at your abrasion for redness, swelling, or pus. These are signs of infection. SEEK IMMEDIATE MEDICAL CARE IF:   You have increasing pain in the wound.   You have redness, swelling, or tenderness around the wound.   You have pus coming from the wound.   You have a fever or persistent symptoms for more than 2-3 days.  You have a fever and your symptoms suddenly get worse.  You have a bad smell coming from the wound or dressing.  MAKE SURE YOU:   Understand these instructions.  Will watch your condition.  Will get help right away if you are not doing well or get worse. Document Released: 10/29/2004 Document Revised: 01/06/2012 Document Reviewed: 12/23/2010 [facility] Patient Information 2015 [place], [place]. This information is not intended to replace advice given to you by your health care provider. Make sure you discuss any questions you have with your health care provider.  Assault, General Assault includes any behavior, whether intentional or reckless, which results in bodily injury to another person and/or damage to property. Included in this would be any behavior, intentional or reckless, that by its nature would be understood (interpreted) by a reasonable person as  intent to harm another person or to damage his/her property. Threats may be oral or written. They may be communicated through regular mail, computer, fax, or phone. These threats may be direct or implied. FORMS OF ASSAULT INCLUDE:  Physically assaulting a person. This includes physical threats to inflict physical harm as well as:  Slapping.  Hitting.  Poking.  Kicking.  Punching.  Pushing.  Arson.  Sabotage.  Equipment vandalism.  Damaging or destroying property.  Throwing or hitting objects.  Displaying a weapon or an  object that appears to be a weapon in a threatening manner.  Carrying a firearm of any kind.  Using a weapon to harm someone.  Using greater physical size/strength to intimidate another.  Making intimidating or threatening gestures.  Bullying.  Hazing.  Intimidating, threatening, hostile, or abusive language directed toward another person.  It communicates the intention to engage in violence against that person. And it leads a reasonable person to expect that violent behavior may occur.  Stalking another person. IF IT HAPPENS AGAIN:  Immediately call for emergency help (911 in [place]  If someone poses clear and immediate danger to you, seek legal authorities to have a protective or restraining order put in place.  Less threatening assaults can at least be reported to authorities. STEPS TO TAKE IF A SEXUAL ASSAULT HAS HAPPENED  Go to an area of safety. This may include a shelter or staying with a friend. Stay away from the area where you have been attacked. A large percentage of sexual assaults are caused by a friend, relative or associate.  If medications were given by your caregiver, take them as directed for the full length of time prescribed.  Only take over-the-counter or prescription medicines for pain, discomfort, or fever as directed by your caregiver.  If you have come in contact with a sexual disease, find out if you are to be tested again. If your caregiver is concerned about the HIV/AIDS virus, he/she may require you to have continued testing for several months.  For the protection of your privacy, test results can not be given over the phone. Make sure you receive the results of your test. If your test results are not back during your visit, make an appointment with your caregiver to find out the results. Do not assume everything is normal if you have not heard from your caregiver or the medical facility. It is important for you to follow up on all of your test  results.  File appropriate papers with authorities. This is important in all assaults, even if it has occurred in a family or by a friend. SEEK MEDICAL CARE IF:  You have new problems because of your injuries.  You have problems that may be because of the medicine you are taking, such as:  Rash.  Itching.  Swelling.  Trouble breathing.  You develop belly (abdominal) pain, feel sick to your stomach (nausea) or are vomiting.  You begin to run a temperature.  You need supportive care or referral to a rape crisis center. These are centers with trained personnel who can help you get through this ordeal. SEEK IMMEDIATE MEDICAL CARE IF:  You are afraid of being threatened, beaten, or abused. In [place] call 911.  You receive new injuries related to abuse.  You develop severe pain in any area injured in the assault or have any change in your condition that concerns you.  You faint or lose consciousness.  You develop chest pain or shortness of breath. Document  Released: 01/19/2005 Document Revised: 04/13/2011 Document Reviewed: 09/07/2007 [facility] Patient Information 2015 [place], [place]. This information is not intended to replace advice given to you by your health care provider. Make sure you discuss any questions you have with your health care provider. Tendon Injury Tendons are strong, cordlike structures that connect muscle to bone. Tendons are made up of woven fibers, like a rope. A tendon injury is a tear (rupture) of the tendon. The rupture may be partial (only a few of the fibers in your tendon rupture) or complete (your entire tendon ruptures). CAUSES  Tendon injuries can be caused by high-stress activities, such as sports. They also can be caused by a repetitive injury or by a single injury from an excessive, rapid force. SYMPTOMS  Symptoms of tendon injury include pain when you move the joint close to the tendon. Other symptoms are swelling, redness, and warmth. DIAGNOSIS    Tendon injuries often can be diagnosed by physical exam. However, sometimes an X-ray exam or advanced imaging, such as magnetic resonance imaging (MRI), is necessary to determine the extent of the injury. TREATMENT  Partial tendon ruptures often can be treated with immobilization. A splint, bandage, or removable brace usually is used to immobilize the injured tendon. Most injured tendons need to be immobilized for 1-2 months before they are completely healed. Complete tendon ruptures may require surgical reattachment. Document Released: 02/27/2004 Document Revised: 01/08/2011 Document Reviewed: 04/12/2011 [facility] Patient Information 2015 [place], [place]. This information is not intended to replace advice given to you by your health care provider. Make sure you discuss any questions you have with your health care provider. Cryotherapy Cryotherapy means treatment with cold. Ice or gel packs can be used to reduce both pain and swelling. Ice is the most helpful within the first 24 to 48 hours after an injury or flare-up from overusing a muscle or joint. Sprains, strains, spasms, burning pain, shooting pain, and aches can all be eased with ice. Ice can also be used when recovering from surgery. Ice is effective, has very few side effects, and is safe for most people to use. PRECAUTIONS  Ice is not a safe treatment option for people with:  Raynaud phenomenon. This is a condition affecting small blood vessels in the extremities. Exposure to cold may cause your problems to return.  Cold hypersensitivity. There are many forms of cold hypersensitivity, including:  Cold urticaria. Red, itchy hives appear on the skin when the tissues begin to warm after being iced.  Cold erythema. This is a red, itchy rash caused by exposure to cold.  Cold hemoglobinuria. Red blood cells break down when the tissues begin to warm after being iced. The hemoglobin that carry oxygen are passed into the urine because they cannot  combine with blood proteins fast enough.  Numbness or altered sensitivity in the area being iced. If you have any of the following conditions, do not use ice until you have discussed cryotherapy with your caregiver:  Heart conditions, such as arrhythmia, angina, or chronic heart disease.  High blood pressure.  Healing wounds or open skin in the area being iced.  Current infections.  Rheumatoid arthritis.  Poor circulation.  Diabetes. Ice slows the blood flow in the region it is applied. This is beneficial when trying to stop inflamed tissues from spreading irritating chemicals to surrounding tissues. However, if you expose your skin to cold temperatures for too long or without the proper protection, you can damage your skin or nerves. Watch for signs of skin  damage due to cold. HOME CARE INSTRUCTIONS Follow these tips to use ice and cold packs safely.  Place a dry or damp towel between the ice and skin. A damp towel will cool the skin more quickly, so you may need to shorten the time that the ice is used.  For a more rapid response, add gentle compression to the ice.  Ice for no more than 10 to 20 minutes at a time. The bonier the area you are icing, the less time it will take to get the benefits of ice.  Check your skin after 5 minutes to make sure there are no signs of a poor response to cold or skin damage.  Rest 20 minutes or more between uses.  Once your skin is numb, you can end your treatment. You can test numbness by very lightly touching your skin. The touch should be so light that you do not see the skin dimple from the pressure of your fingertip. When using ice, most people will feel these normal sensations in this order: cold, burning, aching, and numbness.  Do not use ice on someone who cannot communicate their responses to pain, such as small children or people with dementia. HOW TO MAKE AN ICE PACK Ice packs are the most common way to use ice therapy. Other methods  include ice massage, ice baths, and cryosprays. Muscle creams that cause a cold, tingly feeling do not offer the same benefits that ice offers and should not be used as a substitute unless recommended by your caregiver. To make an ice pack, do one of the following:  Place crushed ice or a bag of frozen vegetables in a sealable plastic bag. Squeeze out the excess air. Place this bag inside another plastic bag. Slide the bag into a pillowcase or place a damp towel between your skin and the bag.  Mix 3 parts water with 1 part rubbing alcohol. Freeze the mixture in a sealable plastic bag. When you remove the mixture from the freezer, it will be slushy. Squeeze out the excess air. Place this bag inside another plastic bag. Slide the bag into a pillowcase or place a damp towel between your skin and the bag. SEEK MEDICAL CARE IF:  You develop white spots on your skin. This may give the skin a blotchy (mottled) appearance.  Your skin turns blue or pale.  Your skin becomes waxy or hard.  Your swelling gets worse. MAKE SURE YOU:   Understand these instructions.  Will watch your condition.  Will get help right away if you are not doing well or get worse. Document Released: 09/15/2010 Document Revised: 06/05/2013 Document Reviewed: 09/15/2010 [facility] Patient Information 2015 [place], [place]. This information is not intended to replace advice given to you by your health care provider. Make sure you discuss any questions you have with your health care provider.
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
## 2013-11-15 NOTE — ED Provider Notes (Signed)
Medical screening examination/treatment/procedure(s) were performed by non-physician practitioner and as supervising physician I was immediately available for consultation/collaboration.   EKG Interpretation None       Orlie Dakin, MD 11/15/13 2348

## 2013-11-20 ENCOUNTER — Other Ambulatory Visit (HOSPITAL_COMMUNITY): Payer: Self-pay | Admitting: Obstetrics

## 2013-11-20 DIAGNOSIS — Z1231 Encounter for screening mammogram for malignant neoplasm of breast: Secondary | ICD-10-CM

## 2013-11-28 ENCOUNTER — Ambulatory Visit (HOSPITAL_COMMUNITY): Payer: Medicaid Other | Attending: Obstetrics

## 2013-12-04 ENCOUNTER — Encounter (HOSPITAL_COMMUNITY): Payer: Self-pay | Admitting: Emergency Medicine

## 2014-01-23 ENCOUNTER — Ambulatory Visit (HOSPITAL_COMMUNITY)
Admission: RE | Admit: 2014-01-23 | Discharge: 2014-01-23 | Disposition: A | Discharge status: Home / Self Care | Payer: Medicaid Other | Source: Ambulatory Visit | Attending: Obstetrics | Admitting: Obstetrics

## 2014-01-23 DIAGNOSIS — Z1231 Encounter for screening mammogram for malignant neoplasm of breast: Secondary | ICD-10-CM

## 2014-03-10 ENCOUNTER — Inpatient Hospital Stay (HOSPITAL_COMMUNITY)
Admission: AD | Admit: 2014-03-10 | Discharge: 2014-03-10 | Disposition: A | Discharge status: Home / Self Care | Payer: Medicaid Other | Source: Ambulatory Visit | Attending: Obstetrics | Admitting: Obstetrics

## 2014-03-10 ENCOUNTER — Encounter (HOSPITAL_COMMUNITY): Payer: Self-pay | Admitting: *Deleted

## 2014-03-10 ENCOUNTER — Inpatient Hospital Stay (HOSPITAL_COMMUNITY): Payer: Medicaid Other

## 2014-03-10 DIAGNOSIS — R1031 Right lower quadrant pain: Secondary | ICD-10-CM | POA: Diagnosis not present

## 2014-03-10 DIAGNOSIS — N390 Urinary tract infection, site not specified: Secondary | ICD-10-CM | POA: Diagnosis not present

## 2014-03-10 DIAGNOSIS — D259 Leiomyoma of uterus, unspecified: Secondary | ICD-10-CM

## 2014-03-10 DIAGNOSIS — R109 Unspecified abdominal pain: Secondary | ICD-10-CM | POA: Diagnosis present

## 2014-03-10 LAB — WET PREP, GENITAL
Trich, Wet Prep: NONE SEEN
Yeast Wet Prep HPF POC: NONE SEEN

## 2014-03-10 LAB — CBC
HCT: 27.9 % — ABNORMAL LOW (ref 36.0–46.0)
Hemoglobin: 8.1 g/dL — ABNORMAL LOW (ref 12.0–15.0)
MCH: 20.5 pg — ABNORMAL LOW (ref 26.0–34.0)
MCHC: 29 g/dL — ABNORMAL LOW (ref 30.0–36.0)
MCV: 70.6 fL — ABNORMAL LOW (ref 78.0–100.0)
Platelets: 210 10*3/uL (ref 150–400)
RBC: 3.95 MIL/uL (ref 3.87–5.11)
RDW: 16.7 % — ABNORMAL HIGH (ref 11.5–15.5)
WBC: 11.3 10*3/uL — ABNORMAL HIGH (ref 4.0–10.5)

## 2014-03-10 LAB — URINALYSIS, ROUTINE W REFLEX MICROSCOPIC
Bilirubin Urine: NEGATIVE
Glucose, UA: NEGATIVE mg/dL
Ketones, ur: NEGATIVE mg/dL
Nitrite: POSITIVE — AB
Protein, ur: NEGATIVE mg/dL
Specific Gravity, Urine: 1.025 (ref 1.005–1.030)
Urobilinogen, UA: 1 mg/dL (ref 0.0–1.0)
pH: 6 (ref 5.0–8.0)

## 2014-03-10 LAB — URINE MICROSCOPIC-ADD ON

## 2014-03-10 LAB — POCT PREGNANCY, URINE: Preg Test, Ur: NEGATIVE

## 2014-03-10 MED ORDER — IBUPROFEN 800 MG PO TABS: 800.0000 mg | ORAL_TABLET | Freq: Three times a day (TID) | ORAL | Status: DC

## 2014-03-10 MED ORDER — NITROFURANTOIN MONOHYD MACRO 100 MG PO CAPS: 100.0000 mg | ORAL_CAPSULE | Freq: Two times a day (BID) | ORAL | Status: DC

## 2014-03-10 MED ORDER — NITROFURANTOIN MONOHYD MACRO 100 MG PO CAPS
100.0000 mg | ORAL_CAPSULE | Freq: Once | ORAL | Status: AC
  Administered 2014-03-10: 100 mg via ORAL
  Filled 2014-03-10: qty 1

## 2014-03-10 MED ORDER — KETOROLAC TROMETHAMINE 60 MG/2ML IM SOLN
60.0000 mg | Freq: Once | INTRAMUSCULAR | Status: AC
  Administered 2014-03-10: 60 mg via INTRAMUSCULAR
  Filled 2014-03-10: qty 2

## 2014-03-10 NOTE — MAU Provider Note (Signed)
History     CSN: [id number]  Arrival date and time: 03/10/14 1733   First Provider Initiated Contact with Patient 03/10/14 1808      Chief Complaint  Patient presents with  . Abdominal Pain   HPI Pt is not pregnant [id number], pt of Dr. Marcheta Grammes with c/o of sudden onset of right lower quadrant and diffuse lower abd pain. Pt denies nausea or vomiting.  Pt has known fibroids.  Pt is sexually active with new partner of less than one year.  Pt is not using anything for contraception.  Pt has vaginal discharge with odor.   Pt has RCM that she says are not that heavy. Pt has not taken anything for the pain but does not want anything at this time.  Past Medical History  Diagnosis Date  . Asthma   . Uterine fibroid   . Gestational diabetes     Past Surgical History  Procedure Laterality Date  . Arm surgery    . Dilation and curettage of uterus    . Cesarean section      Family History  Problem Relation Age of Onset  . Hypertension Mother   . Hypertension Brother   . Hypertension Maternal Uncle     History  Substance Use Topics  . Smoking status: Never Smoker   . Smokeless tobacco: Not on file  . Alcohol Use: No    Allergies:  Allergies  Allergen Reactions  . Shellfish Allergy Itching and Swelling    Throat swells   . Other Itching and Swelling    Hazelnut    Prescriptions prior to admission  Medication Sig Dispense Refill Last Dose  . albuterol (PROVENTIL HFA;VENTOLIN HFA) 108 (90 BASE) MCG/ACT inhaler Inhale 1 puff into the lungs every 6 (six) hours as needed for wheezing or shortness of breath.   rescue  . HYDROcodone-acetaminophen (NORCO/VICODIN) 5-325 MG per tablet Take 1-2 tablets by mouth every 4 (four) hours as needed for moderate pain. (Patient not taking: Reported on 03/10/2014) 15 tablet 0   . sodium chloride (OCEAN) 0.65 % nasal spray Place 1 spray into the nose as needed for congestion. (Patient not taking: Reported on 03/10/2014) 15 mL 12     Review of  Systems  Constitutional: Negative for fever and chills.  Gastrointestinal: Positive for abdominal pain. Negative for nausea, vomiting, diarrhea and constipation.  Genitourinary: Negative for dysuria and urgency.   Physical Exam   Blood pressure 126/75, pulse 93, temperature 99.5 F (37.5 C), resp. rate 18, last menstrual period 02/22/2014.  Physical Exam  Nursing note and vitals reviewed. Constitutional: She is oriented to person, place, and time. She appears well-developed and well-nourished. No distress.  HENT:  Head: Normocephalic.  Eyes: Pupils are equal, round, and reactive to light.  Neck: Normal range of motion. Neck supple.  Cardiovascular: Normal rate.   Respiratory: Effort normal.  GI: Soft.  Genitourinary:  Mod amount of watery brown discharge in vault; cervix parous, NT; uterus enlarged to 18-20 week size filling pelvic cavity- firm irregular, NT  Musculoskeletal: Normal range of motion.  Neurological: She is alert and oriented to person, place, and time.  Skin: Skin is warm and dry.  Psychiatric: She has a normal mood and affect.    MAU Course  Procedures Results for orders placed or performed during the hospital encounter of 03/10/14 (from the past 24 hour(s))  Urinalysis, Routine w reflex microscopic     Status: Abnormal   Collection Time: 03/10/14  5:45 PM  Result Value  Ref Range   Color, Urine YELLOW YELLOW   APPearance CLEAR CLEAR   Specific Gravity, Urine 1.025 1.005 - 1.030   pH 6.0 5.0 - 8.0   Glucose, UA NEGATIVE NEGATIVE mg/dL   Hgb urine dipstick TRACE (A) NEGATIVE   Bilirubin Urine NEGATIVE NEGATIVE   Ketones, ur NEGATIVE NEGATIVE mg/dL   Protein, ur NEGATIVE NEGATIVE mg/dL   Urobilinogen, UA 1.0 0.0 - 1.0 mg/dL   Nitrite POSITIVE (A) NEGATIVE   Leukocytes, UA LARGE (A) NEGATIVE  Urine microscopic-add on     Status: Abnormal   Collection Time: 03/10/14  5:45 PM  Result Value Ref Range   Squamous Epithelial / LPF FEW (A) RARE   WBC, UA TOO  NUMEROUS TO COUNT <3 WBC/hpf   RBC / HPF 0-2 <3 RBC/hpf   Bacteria, UA MANY (A) RARE   Urine-Other MUCOUS PRESENT   Pregnancy, urine POC     Status: None   Collection Time: 03/10/14  6:03 PM  Result Value Ref Range   Preg Test, Ur NEGATIVE NEGATIVE  CBC     Status: Abnormal   Collection Time: 03/10/14  6:20 PM  Result Value Ref Range   WBC 11.3 (H) 4.0 - 10.5 K/uL   RBC 3.95 3.87 - 5.11 MIL/uL   Hemoglobin 8.1 (L) 12.0 - 15.0 g/dL   HCT 27.9 (L) 36.0 - 46.0 %   MCV 70.6 (L) 78.0 - 100.0 fL   MCH 20.5 (L) 26.0 - 34.0 pg   MCHC 29.0 (L) 30.0 - 36.0 g/dL   RDW 16.7 (H) 11.5 - 15.5 %   Platelets 210 150 - 400 K/uL  Wet prep, genital     Status: Abnormal   Collection Time: 03/10/14  6:20 PM  Result Value Ref Range   Yeast Wet Prep HPF POC NONE SEEN NONE SEEN   Trich, Wet Prep NONE SEEN NONE SEEN   Clue Cells Wet Prep HPF POC FEW (A) NONE SEEN   WBC, Wet Prep HPF POC FEW (A) NONE SEEN  urine culture pending Toradol 60mg  IM given in MAU US Transvaginal Non-ob  03/10/2014   CLINICAL DATA:  Right lower quadrant pelvic pain. History of fibroids.  EXAM: TRANSABDOMINAL AND TRANSVAGINAL ULTRASOUND OF PELVIS  TECHNIQUE: Both transabdominal and transvaginal ultrasound examinations of the pelvis were performed. Transabdominal technique was performed for global imaging of the pelvis including uterus, ovaries, adnexal regions, and pelvic cul-de-sac. It was necessary to proceed with endovaginal exam following the transabdominal exam to visualize the endometrium and ovaries.  COMPARISON:  06/25/2010  FINDINGS: Uterus  Measurements: 16.8 x 13.2 x 10.1 cm. Anteverted, anteflexed. Multiple fibroids are noted as follows:  Dominant midbody submucosal/ intramural central fibroid measuring 8.5 x 7.8 x 6.5 cm.  Right lateral intramural fibroid, 3.9 x 3.8 x 3.6 cm.  Posterior uterine fundal intramural fibroid, 3.9 x 3.5 x 3.1 cm.  Endometrium  Thickness: Obscured by presumed fibroid.  Not discretely measurable.   Right ovary  Measurements: 4.3 x 4.0 x 3.2 cm. Normal appearance/no adnexal mass. Seen transabdominally only  Left ovary  Measurements: 4.7 x 2.5 x 1.9 cm. Normal appearance/no adnexal mass. Seen transabdominally only.  Other findings  No free fluid.  IMPRESSION: Uterine fibroids as above, the largest of which is located within the central uterus with a probable dominant submucosal component. However, other endometrial abnormalities such as hyperplasia/ neoplasia or less likely polyp could appear similar. Consider pelvic MRI with contrast on a nonemergent outpatient basis for better delineation of fibroid location  and to determine whether the patient is a candidate for noninvasive treatment options.   Electronically Signed   By: Conchita Paris M.D.   On: 03/10/2014 20:26   US Pelvis Complete  03/10/2014   CLINICAL DATA:  Right lower quadrant pelvic pain. History of fibroids.  EXAM: TRANSABDOMINAL AND TRANSVAGINAL ULTRASOUND OF PELVIS  TECHNIQUE: Both transabdominal and transvaginal ultrasound examinations of the pelvis were performed. Transabdominal technique was performed for global imaging of the pelvis including uterus, ovaries, adnexal regions, and pelvic cul-de-sac. It was necessary to proceed with endovaginal exam following the transabdominal exam to visualize the endometrium and ovaries.  COMPARISON:  06/25/2010  FINDINGS: Uterus  Measurements: 16.8 x 13.2 x 10.1 cm. Anteverted, anteflexed. Multiple fibroids are noted as follows:  Dominant midbody submucosal/ intramural central fibroid measuring 8.5 x 7.8 x 6.5 cm.  Right lateral intramural fibroid, 3.9 x 3.8 x 3.6 cm.  Posterior uterine fundal intramural fibroid, 3.9 x 3.5 x 3.1 cm.  Endometrium  Thickness: Obscured by presumed fibroid.  Not discretely measurable.  Right ovary  Measurements: 4.3 x 4.0 x 3.2 cm. Normal appearance/no adnexal mass. Seen transabdominally only  Left ovary  Measurements: 4.7 x 2.5 x 1.9 cm. Normal appearance/no adnexal mass.  Seen transabdominally only.  Other findings  No free fluid.  IMPRESSION: Uterine fibroids as above, the largest of which is located within the central uterus with a probable dominant submucosal component. However, other endometrial abnormalities such as hyperplasia/ neoplasia or less likely polyp could appear similar. Consider pelvic MRI with contrast on a nonemergent outpatient basis for better delineation of fibroid location and to determine whether the patient is a candidate for noninvasive treatment options.   Electronically Signed   By: Conchita Paris M.D.   On: 03/10/2014 20:26  Macrobid one dose in MAU Discussed with dr. Ruthann Cancer- will have pt f/u with him next week  Assessment and Plan  UTI- Macrobid BID for 10days Fibroids- f/u with dr. Ruthann Cancer- Ibuprofen 800mg  Rx Sherolyn Buba 03/10/2014, 6:09 PM

## 2014-03-10 NOTE — MAU Note (Signed)
Pt reports she started having RLQ pain last night. Stated she could hardly move last night with it. Pain a little better today but it still hurts.

## 2014-03-12 LAB — GC/CHLAMYDIA PROBE AMP (~~LOC~~) NOT AT ARMC
Chlamydia: NEGATIVE
Neisseria Gonorrhea: NEGATIVE

## 2014-03-13 LAB — URINE CULTURE
Colony Count: >=100000
Special Requests: NORMAL

## 2014-03-28 ENCOUNTER — Other Ambulatory Visit: Payer: Self-pay | Admitting: Obstetrics

## 2014-03-29 NOTE — Addendum Note (Signed)
Addended by: Gracy Racer A on: 03/29/2014 07:07 AM   Modules accepted: Orders

## 2014-04-06 ENCOUNTER — Emergency Department (INDEPENDENT_AMBULATORY_CARE_PROVIDER_SITE_OTHER)
Admission: EM | Admit: 2014-04-06 | Discharge: 2014-04-06 | Disposition: A | Discharge status: Home / Self Care | Payer: Medicaid Other | Source: Home / Self Care | Attending: Family Medicine | Admitting: Family Medicine

## 2014-04-06 ENCOUNTER — Encounter (HOSPITAL_COMMUNITY): Payer: Self-pay | Admitting: Emergency Medicine

## 2014-04-06 DIAGNOSIS — M79662 Pain in left lower leg: Secondary | ICD-10-CM
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
## 2014-04-06 NOTE — ED Notes (Signed)
See provider's note

## 2014-05-11 NOTE — H&P (Signed)
NAME:  [name], [name]                    ACCOUNT NO.:  MEDICAL RECORD NO.:  [id number]  LOCATION:                                 FACILITY:  PHYSICIAN:  Frederico Hamman, M.D.DATE OF BIRTH:  [birth year]  DATE OF ADMISSION: DATE OF DISCHARGE:                             HISTORY & PHYSICAL   Patient is a 39 year old, gravida 4, para 2-0-2-2, who had a long history of myomas, and she has been treated with iron which she does not take on a consistent basis.  She is anemic.  She has a period once a month, heavy, and the patient says she wants to have more children so she is in for a myomectomy.  PAST HISTORY:  Anemia with iron treatment; Pap smear, atypical cells.  SURGICAL HISTORY:  Negative.  SOCIAL HISTORY:  Negative.  SYSTEM REVIEW:  Noncontributory.  PHYSICAL EXAM:  GENERAL:  Well-developed female in no distress. HEENT:  Negative. LUNGS:  Clear to P and A.  HEART:  Regular rhythm.  No murmurs, no gallops. BREASTS:  Negative. ABDOMEN:  Mass arising from the pelvis with multiple myomas on ultrasound and her Pap smear is negative.  Vagina, external genitalia normal. EXTREMITIES:  Negative.          ______________________________ Frederico Hamman, M.D.     BAM/MEDQ  D:  05/11/2014  T:  05/11/2014  Job:  [id number]

## 2014-05-16 ENCOUNTER — Inpatient Hospital Stay (HOSPITAL_COMMUNITY): Admission: RE | Admit: 2014-05-16 | Payer: Medicaid Other | Source: Ambulatory Visit | Admitting: Obstetrics

## 2014-05-16 SURGERY — MYOMECTOMY, ABDOMINAL APPROACH
Anesthesia: General

## 2014-08-21 LAB — CYTOLOGY - PAP

## 2014-11-01 ENCOUNTER — Inpatient Hospital Stay (HOSPITAL_COMMUNITY)
Admission: AD | Admit: 2014-11-01 | Discharge: 2014-11-01 | Disposition: A | Discharge status: Home / Self Care | Payer: Medicaid Other | Source: Ambulatory Visit | Attending: Obstetrics | Admitting: Obstetrics

## 2014-11-01 ENCOUNTER — Encounter (HOSPITAL_COMMUNITY): Payer: Self-pay | Admitting: *Deleted

## 2014-11-01 DIAGNOSIS — B373 Candidiasis of vulva and vagina: Secondary | ICD-10-CM | POA: Diagnosis not present

## 2014-11-01 DIAGNOSIS — B9689 Other specified bacterial agents as the cause of diseases classified elsewhere: Secondary | ICD-10-CM

## 2014-11-01 DIAGNOSIS — N760 Acute vaginitis: Secondary | ICD-10-CM | POA: Insufficient documentation

## 2014-11-01 DIAGNOSIS — A499 Bacterial infection, unspecified: Secondary | ICD-10-CM | POA: Diagnosis not present

## 2014-11-01 DIAGNOSIS — B3731 Acute candidiasis of vulva and vagina: Secondary | ICD-10-CM

## 2014-11-01 DIAGNOSIS — N898 Other specified noninflammatory disorders of vagina: Secondary | ICD-10-CM | POA: Diagnosis present

## 2014-11-01 LAB — WET PREP, GENITAL
Trich, Wet Prep: NONE SEEN
Yeast Wet Prep HPF POC: NONE SEEN

## 2014-11-01 LAB — URINALYSIS, ROUTINE W REFLEX MICROSCOPIC
Bilirubin Urine: NEGATIVE
Glucose, UA: NEGATIVE mg/dL
Ketones, ur: NEGATIVE mg/dL
Leukocytes, UA: NEGATIVE
Nitrite: NEGATIVE
Protein, ur: 100 mg/dL — AB
Specific Gravity, Urine: >1.03 — ABNORMAL HIGH (ref 1.005–1.030)
Urobilinogen, UA: 0.2 mg/dL (ref 0.0–1.0)
pH: 6 (ref 5.0–8.0)

## 2014-11-01 LAB — URINE MICROSCOPIC-ADD ON

## 2014-11-01 LAB — POCT PREGNANCY, URINE: Preg Test, Ur: NEGATIVE

## 2014-11-01 MED ORDER — FLUCONAZOLE 150 MG PO TABS: 150.0000 mg | ORAL_TABLET | ORAL | Status: DC

## 2014-11-01 MED ORDER — METRONIDAZOLE 500 MG PO TABS: 500.0000 mg | ORAL_TABLET | Freq: Two times a day (BID) | ORAL | Status: DC

## 2014-11-01 NOTE — Discharge Instructions (Signed)
Bacterial Vaginosis °Bacterial vaginosis is a vaginal infection that occurs when the normal balance of bacteria in the vagina is disrupted. It results from an overgrowth of certain bacteria. This is the most common vaginal infection in women of childbearing age. Treatment is important to prevent complications, especially in pregnant women, as it can cause a premature delivery. °CAUSES  °Bacterial vaginosis is caused by an increase in harmful bacteria that are normally present in smaller amounts in the vagina. Several different kinds of bacteria can cause bacterial vaginosis. However, the reason that the condition develops is not fully understood. °RISK FACTORS °Certain activities or behaviors can put you at an increased risk of developing bacterial vaginosis, including: °· Having a new sex partner or multiple sex partners. °· Douching. °· Using an intrauterine device (IUD) for contraception. °Women do not get bacterial vaginosis from toilet seats, bedding, swimming pools, or contact with objects around them. °SIGNS AND SYMPTOMS  °Some women with bacterial vaginosis have no signs or symptoms. Common symptoms include: °· Grey vaginal discharge. °· A fishlike odor with discharge, especially after sexual intercourse. °· Itching or burning of the vagina and vulva. °· Burning or pain with urination. °DIAGNOSIS  °Your health care provider will take a medical history and examine the vagina for signs of bacterial vaginosis. A sample of vaginal fluid may be taken. Your health care provider will look at this sample under a microscope to check for bacteria and abnormal cells. A vaginal pH test may also be done.  °TREATMENT  °Bacterial vaginosis may be treated with antibiotic medicines. These may be given in the form of a pill or a vaginal cream. A second round of antibiotics may be prescribed if the condition comes back after treatment.  °HOME CARE INSTRUCTIONS  °· Only take over-the-counter or prescription medicines as  directed by your health care provider. °· If antibiotic medicine was prescribed, take it as directed. Make sure you finish it even if you start to feel better. °· Do not have sex until treatment is completed. °· Tell all sexual partners that you have a vaginal infection. They should see their health care provider and be treated if they have problems, such as a mild rash or itching. °· Practice safe sex by using condoms and only having one sex partner. °SEEK MEDICAL CARE IF:  °· Your symptoms are not improving after 3 days of treatment. °· You have increased discharge or pain. °· You have a fever. °MAKE SURE YOU:  °· Understand these instructions. °· Will watch your condition. °· Will get help right away if you are not doing well or get worse. °FOR MORE INFORMATION  °Centers for Disease Control and Prevention, Division of STD Prevention: www.cdc.gov/std °American Sexual Health Association (ASHA): www.ashastd.org  °Document Released: 01/19/2005 Document Revised: 11/09/2012 Document Reviewed: 08/31/2012 °ExitCare® Patient Information ©2015 [employer]. This information is not intended to replace advice given to you by your health care provider. Make sure you discuss any questions you have with your health care provider. ° °Vaginitis °Vaginitis is an inflammation of the vagina. It is most often caused by a change in the normal balance of the bacteria and yeast that live in the vagina. This change in balance causes an overgrowth of certain bacteria or yeast, which causes the inflammation. There are different types of vaginitis, but the most common types are: °· Bacterial vaginosis. °· Yeast infection (candidiasis). °· Trichomoniasis vaginitis. This is a sexually transmitted infection (STI). °· Viral vaginitis. °· Atropic vaginitis. °· Allergic vaginitis. °  CAUSES  °The cause depends on the type of vaginitis. Vaginitis can be caused by: °· Bacteria (bacterial vaginosis). °· Yeast (yeast infection). °· A parasite  (trichomoniasis vaginitis) °· A virus (viral vaginitis). °· Low hormone levels (atrophic vaginitis). Low hormone levels can occur during pregnancy, breastfeeding, or after menopause. °· Irritants, such as bubble baths, scented tampons, and feminine sprays (allergic vaginitis). °Other factors can change the normal balance of the yeast and bacteria that live in the vagina. These include: °· Antibiotic medicines. °· Poor hygiene. °· Diaphragms, vaginal sponges, spermicides, birth control pills, and intrauterine devices (IUD). °· Sexual intercourse. °· Infection. °· Uncontrolled diabetes. °· A weakened immune system. °SYMPTOMS  °Symptoms can vary depending on the cause of the vaginitis. Common symptoms include: °· Abnormal vaginal discharge. °¨ The discharge is white, gray, or yellow with bacterial vaginosis. °¨ The discharge is thick, white, and cheesy with a yeast infection. °¨ The discharge is frothy and yellow or greenish with trichomoniasis. °· A bad vaginal odor. °¨ The odor is fishy with bacterial vaginosis. °· Vaginal itching, pain, or swelling. °· Painful intercourse. °· Pain or burning when urinating. °Sometimes, there are no symptoms. °TREATMENT  °Treatment will vary depending on the type of infection.  °· Bacterial vaginosis and trichomoniasis are often treated with antibiotic creams or pills. °· Yeast infections are often treated with antifungal medicines, such as vaginal creams or suppositories. °· Viral vaginitis has no cure, but symptoms can be treated with medicines that relieve discomfort. Your sexual partner should be treated as well. °· Atrophic vaginitis may be treated with an estrogen cream, pill, suppository, or vaginal ring. If vaginal dryness occurs, lubricants and moisturizing creams may help. You may be told to avoid scented soaps, sprays, or douches. °· Allergic vaginitis treatment involves quitting the use of the product that is causing the problem. Vaginal creams can be used to treat the  symptoms. °HOME CARE INSTRUCTIONS  °· Take all medicines as directed by your caregiver. °· Keep your genital area clean and dry. Avoid soap and only rinse the area with water. °· Avoid douching. It can remove the healthy bacteria in the vagina. °· Do not use tampons or have sexual intercourse until your vaginitis has been treated. Use sanitary pads while you have vaginitis. °· Wipe from front to back. This avoids the spread of bacteria from the rectum to the vagina. °· Let air reach your genital area. °¨ Wear cotton underwear to decrease moisture buildup. °¨ Avoid wearing underwear while you sleep until your vaginitis is gone. °¨ Avoid tight pants and underwear or nylons without a cotton panel. °¨ Take off wet clothing (especially bathing suits) as soon as possible. °· Use mild, non-scented products. Avoid using irritants, such as: °¨ Scented feminine sprays. °¨ Fabric softeners. °¨ Scented detergents. °¨ Scented tampons. °¨ Scented soaps or bubble baths. °· Practice safe sex and use condoms. Condoms may prevent the spread of trichomoniasis and viral vaginitis. °SEEK MEDICAL CARE IF:  °· You have abdominal pain. °· You have a fever or persistent symptoms for more than 2-3 days. °· You have a fever and your symptoms suddenly get worse. °Document Released: 11/16/2006 Document Revised: 10/14/2011 Document Reviewed: 07/02/2011 °ExitCare® Patient Information ©2015 [employer]. This information is not intended to replace advice given to you by your health care provider. Make sure you discuss any questions you have with your health care provider. ° °

## 2014-11-01 NOTE — MAU Provider Note (Signed)
Chief Complaint: Vaginal Itching   First Provider Initiated Contact with Patient 11/01/14 1031     SUBJECTIVE HPI: [name] is a 39 y.o. [id number] female who presents to [facility] Admissions reporting vaginal discharge. Started several weeks ago. Was seen in the office, had pelvic exam done and swabs obtained. Treated with a three-day yeast medicine. Doesn't know the name. No relief of symptoms. Call the office and was prescribed ketoconazole cream to use on her vulva. Still no relief in symptoms. Aim to maternity admissions because she is continuing to have vaginal discharge. Did not notify Dr. Ruthann Cancer of ongoing symptoms.   Location: Vagina Quality: Vaginal discharge and irritation Severity: 0/10 on pain scale Duration: Several weeks Context: None Timing: Constant Modifying factors: No improvement with 2 different medications. Associated signs and symptoms: Negative for fever, chills, abdominal pain, intermenstrual bleeding, vaginal odor or dyspareunia.  Has douched and used external and internal vaginal cleansing products in the past few months. Sexually active and consistently uses condoms.  Past Medical History  Diagnosis Date  . Asthma   . Uterine fibroid   . Gestational diabetes    OB History  Gravida Para Term Preterm AB SAB TAB Ectopic Multiple Living  4 3 2 1 1 1    2     # Outcome Date GA Lbr Len/2nd Weight Sex Delivery Anes PTL Lv  4 SAB           3 Term     F CS-LTranv   Y     Comments: compound presentation  2 Term      Vag-Spont   Y  1 Preterm  [redacted]w[redacted]d       ND     Comments: twins     Past Surgical History  Procedure Laterality Date  . Arm surgery    . Dilation and curettage of uterus    . Cesarean section     Social History   Social History  . Marital Status: Single    Spouse Name: N/A  . Number of Children: N/A  . Years of Education: N/A   Occupational History  . Not on file.   Social History Main Topics  . Smoking status: Never Smoker   .  Smokeless tobacco: Not on file  . Alcohol Use: No  . Drug Use: No  . Sexual Activity: Yes    Birth Control/ Protection: None, Condom   Other Topics Concern  . Not on file   Social History Narrative   No current facility-administered medications on file prior to encounter.   Current Outpatient Prescriptions on File Prior to Encounter  Medication Sig Dispense Refill  . albuterol (PROVENTIL HFA;VENTOLIN HFA) 108 (90 BASE) MCG/ACT inhaler Inhale 1 puff into the lungs every 6 (six) hours as needed for wheezing or shortness of breath.    Marland Kitchen HYDROcodone-acetaminophen (NORCO/VICODIN) 5-325 MG per tablet Take 1-2 tablets by mouth every 4 (four) hours as needed for moderate pain. (Patient not taking: Reported on 03/10/2014) 15 tablet 0  . ibuprofen (ADVIL,MOTRIN) 800 MG tablet Take 1 tablet (800 mg total) by mouth 3 (three) times daily. (Patient not taking: Reported on 11/01/2014) 21 tablet 0  . nitrofurantoin, macrocrystal-monohydrate, (MACROBID) 100 MG capsule Take 1 capsule (100 mg total) by mouth 2 (two) times daily. (Patient not taking: Reported on 11/01/2014) 10 capsule 0  . sodium chloride (OCEAN) 0.65 % nasal spray Place 1 spray into the nose as needed for congestion. (Patient not taking: Reported on 03/10/2014) 15 mL 12  Allergies  Allergen Reactions  . Shellfish Allergy Itching and Swelling    Throat swells   . Other Itching and Swelling    Hazelnut    I have reviewed the past Medical Hx, Surgical Hx, Social Hx, Allergies and Medications.   Review of Systems  Constitutional: Negative for fever and chills.  Gastrointestinal: Negative for abdominal pain.  Genitourinary: Positive for vaginal discharge. Negative for dysuria, vaginal bleeding and vaginal pain.    OBJECTIVE Patient Vitals for the past 24 hrs:  BP Temp Temp src Pulse Resp Height Weight  11/01/14 0955 119/62 mmHg 98.7 F (37.1 C) Oral 75 18 5' 5.5" (1.664 m) 170 lb 12.8 oz (77.474 kg)   Constitutional: Well-developed,  well-nourished female in no acute distress.  Cardiovascular: normal rate Respiratory: normal rate and effort.  GI: Abd soft, non-tender. MS: Extremities nontender, no edema, normal ROM Neurologic: Alert and oriented x 4.  GU: Neg CVAT.  SPECULUM EXAM: NEFG, combination of thick, white, odorless, curd-like discharge and thin, watery, yellow discharge, no blood noted, cervix clean except for increased vascularity at 10:00.  BIMANUAL: cervix closed; uterus normal size, no adnexal tenderness or masses. No CMT.  LAB RESULTS Results for orders placed or performed during the hospital encounter of 11/01/14 (from the past 24 hour(s))  Urinalysis, Routine w reflex microscopic (not at [facility])     Status: Abnormal   Collection Time: 11/01/14  9:57 AM  Result Value Ref Range   Color, Urine YELLOW YELLOW   APPearance CLEAR CLEAR   Specific Gravity, Urine >1.030 (H) 1.005 - 1.030   pH 6.0 5.0 - 8.0   Glucose, UA NEGATIVE NEGATIVE mg/dL   Hgb urine dipstick TRACE (A) NEGATIVE   Bilirubin Urine NEGATIVE NEGATIVE   Ketones, ur NEGATIVE NEGATIVE mg/dL   Protein, ur 100 (A) NEGATIVE mg/dL   Urobilinogen, UA 0.2 0.0 - 1.0 mg/dL   Nitrite NEGATIVE NEGATIVE   Leukocytes, UA NEGATIVE NEGATIVE  Urine microscopic-add on     Status: Abnormal   Collection Time: 11/01/14  9:57 AM  Result Value Ref Range   Squamous Epithelial / LPF FEW (A) RARE   RBC / HPF 0-2 <3 RBC/hpf   Bacteria, UA RARE RARE  Pregnancy, urine POC     Status: None   Collection Time: 11/01/14 10:01 AM  Result Value Ref Range   Preg Test, Ur NEGATIVE NEGATIVE  Wet prep, genital     Status: Abnormal   Collection Time: 11/01/14 10:40 AM  Result Value Ref Range   Yeast Wet Prep HPF POC NONE SEEN NONE SEEN   Trich, Wet Prep NONE SEEN NONE SEEN   Clue Cells Wet Prep HPF POC MODERATE (A) NONE SEEN   WBC, Wet Prep HPF POC FEW (A) NONE SEEN    IMAGING No results found.  MAU COURSE Wet prep, GC/chlamydia cultures, UA, UPT.  Clinical  diagnosis of yeast infection. Offered patient Diflucan. Patient nervous about taking medications without ask a Dr. Ruthann Cancer. Called Dr. Ruthann Cancer, but he was in a C-section. Will prescribe Diflucan for yeast and Flagyl for BV and have patient call his office to discuss medications if she is still not comfortable starting them.  MDM 39 year old female with vaginal discharge is not resolved with 2 different yeast infection treatment. No evidence of PID. Will try different treatment for yeast and also treat for BV. No emergent condition apparent at this time.  ASSESSMENT 1. BV (bacterial vaginosis)   2. Vaginal yeast infection     PLAN Discharge  home in stable condition. Pelvic rest 1 week      Follow-up Information    Follow up with MARSHALL,BERNARD A, MD.   Specialty:  Obstetrics and Gynecology   Why:  a needed if no improvement in 1 week    Contact information:   [address] [place] [address] [phone]        Medication List    STOP taking these medications        HYDROcodone-acetaminophen 5-325 MG tablet  Commonly known as:  NORCO/VICODIN     ibuprofen 800 MG tablet  Commonly known as:  ADVIL,MOTRIN     ketoconazole 2 % cream  Commonly known as:  NIZORAL     nitrofurantoin (macrocrystal-monohydrate) 100 MG capsule  Commonly known as:  MACROBID     sodium chloride 0.65 % nasal spray  Commonly known as:  OCEAN      TAKE these medications        albuterol 108 (90 BASE) MCG/ACT inhaler  Commonly known as:  PROVENTIL HFA;VENTOLIN HFA  Inhale 1 puff into the lungs every 6 (six) hours as needed for wheezing or shortness of breath.     fluconazole 150 MG tablet  Commonly known as:  DIFLUCAN  Take 1 tablet (150 mg total) by mouth every 3 (three) days. For three doses     metroNIDAZOLE 500 MG tablet  Commonly known as:  FLAGYL  Take 1 tablet (500 mg total) by mouth 2 (two) times daily.       [place], [place] 11/01/2014  11:49  AM

## 2014-11-01 NOTE — MAU Note (Signed)
Pt reports vaginal discomfort itching and odor for several weeks. Went to Dr. Ruthann Cancer  And sis not find anything did 3 day yeast treatment. Still having sypmtoms and then given some ketoconazol cream on the outside. Still feeling symptomatic.

## 2014-11-02 ENCOUNTER — Ambulatory Visit (HOSPITAL_COMMUNITY): Admission: RE | Admit: 2014-11-02 | Payer: Medicaid Other | Source: Ambulatory Visit

## 2014-11-02 ENCOUNTER — Other Ambulatory Visit (HOSPITAL_COMMUNITY): Payer: Self-pay | Admitting: Obstetrics

## 2014-11-02 ENCOUNTER — Ambulatory Visit (HOSPITAL_COMMUNITY)
Admission: RE | Admit: 2014-11-02 | Discharge: 2014-11-02 | Disposition: A | Discharge status: Home / Self Care | Payer: Medicaid Other | Source: Ambulatory Visit | Attending: Obstetrics | Admitting: Obstetrics

## 2014-11-02 DIAGNOSIS — Z1231 Encounter for screening mammogram for malignant neoplasm of breast: Secondary | ICD-10-CM

## 2014-11-02 LAB — GC/CHLAMYDIA PROBE AMP (~~LOC~~) NOT AT ARMC
Chlamydia: NEGATIVE
Neisseria Gonorrhea: NEGATIVE

## 2015-05-07 LAB — CYTOLOGY - PAP: Pap Smear: NEGATIVE

## 2015-05-09 ENCOUNTER — Other Ambulatory Visit (HOSPITAL_COMMUNITY): Payer: Self-pay | Admitting: Obstetrics

## 2015-05-09 DIAGNOSIS — D259 Leiomyoma of uterus, unspecified: Secondary | ICD-10-CM

## 2015-05-13 ENCOUNTER — Encounter: Payer: Self-pay | Admitting: Hematology and Oncology

## 2015-05-16 ENCOUNTER — Ambulatory Visit (HOSPITAL_COMMUNITY): Payer: Medicaid Other

## 2015-05-23 ENCOUNTER — Ambulatory Visit (HOSPITAL_COMMUNITY)
Admission: RE | Admit: 2015-05-23 | Discharge: 2015-05-23 | Disposition: A | Discharge status: Home / Self Care | Payer: Medicaid Other | Source: Ambulatory Visit | Attending: Obstetrics | Admitting: Obstetrics

## 2015-05-23 DIAGNOSIS — D252 Subserosal leiomyoma of uterus: Secondary | ICD-10-CM | POA: Insufficient documentation

## 2015-05-23 DIAGNOSIS — N83202 Unspecified ovarian cyst, left side: Secondary | ICD-10-CM | POA: Diagnosis not present

## 2015-05-23 DIAGNOSIS — D250 Submucous leiomyoma of uterus: Secondary | ICD-10-CM | POA: Insufficient documentation

## 2015-05-23 DIAGNOSIS — D259 Leiomyoma of uterus, unspecified: Secondary | ICD-10-CM

## 2015-05-27 ENCOUNTER — Encounter: Payer: Self-pay | Admitting: Hematology and Oncology

## 2015-05-27 ENCOUNTER — Ambulatory Visit (HOSPITAL_BASED_OUTPATIENT_CLINIC_OR_DEPARTMENT_OTHER): Payer: Medicaid Other | Admitting: Hematology and Oncology

## 2015-05-27 ENCOUNTER — Ambulatory Visit (HOSPITAL_BASED_OUTPATIENT_CLINIC_OR_DEPARTMENT_OTHER): Payer: Medicaid Other

## 2015-05-27 ENCOUNTER — Telehealth: Payer: Self-pay | Admitting: Hematology and Oncology

## 2015-05-27 VITALS — BP 143/62 | HR 68 | Temp 98.2°F | Resp 18 | Ht 65.5 in | Wt 175.0 lb

## 2015-05-27 DIAGNOSIS — D509 Iron deficiency anemia, unspecified: Secondary | ICD-10-CM | POA: Diagnosis present

## 2015-05-27 LAB — COMPREHENSIVE METABOLIC PANEL
ALT: 11 U/L (ref 0–55)
AST: 17 U/L (ref 5–34)
Albumin: 3.7 g/dL (ref 3.5–5.0)
Alkaline Phosphatase: 44 U/L (ref 40–150)
Anion Gap: 7 mEq/L (ref 3–11)
BUN: 9.9 mg/dL (ref 7.0–26.0)
CO2: 29 mEq/L (ref 22–29)
Calcium: 9.8 mg/dL (ref 8.4–10.4)
Chloride: 105 mEq/L (ref 98–109)
Creatinine: 0.8 mg/dL (ref 0.6–1.1)
EGFR: >90 mL/min/{1.73_m2} (ref >90)
Glucose: 108 mg/dl (ref 70–140)
Potassium: 4 mEq/L (ref 3.5–5.1)
Sodium: 141 mEq/L (ref 136–145)
Total Bilirubin: 0.76 mg/dL (ref 0.20–1.20)
Total Protein: 7.3 g/dL (ref 6.4–8.3)

## 2015-05-27 LAB — CBC & DIFF AND RETIC
BASO%: 1.7 % (ref 0.0–2.0)
Basophils Absolute: 0.1 10*3/uL (ref 0.0–0.1)
EOS%: 4.9 % (ref 0.0–7.0)
Eosinophils Absolute: 0.2 10*3/uL (ref 0.0–0.5)
HCT: 32.5 % — ABNORMAL LOW (ref 34.8–46.6)
HGB: 9.2 g/dL — ABNORMAL LOW (ref 11.6–15.9)
Immature Retic Fract: 7 % (ref 1.60–10.00)
LYMPH%: 43.1 % (ref 14.0–49.7)
MCH: 21.4 pg — ABNORMAL LOW (ref 25.1–34.0)
MCHC: 28.2 g/dL — ABNORMAL LOW (ref 31.5–36.0)
MCV: 75.8 fL — ABNORMAL LOW (ref 79.5–101.0)
MONO#: 0.2 10*3/uL (ref 0.1–0.9)
MONO%: 7.2 % (ref 0.0–14.0)
NEUT#: 1.5 10*3/uL (ref 1.5–6.5)
NEUT%: 43.1 % (ref 38.4–76.8)
Platelets: 349 10*3/uL (ref 145–400)
RBC: 4.29 10*6/uL (ref 3.70–5.45)
RDW: 32.1 % — ABNORMAL HIGH (ref 11.2–14.5)
Retic %: 3.22 % — ABNORMAL HIGH (ref 0.70–2.10)
Retic Ct Abs: 138.14 10*3/uL — ABNORMAL HIGH (ref 33.70–90.70)
WBC: 3.5 10*3/uL — ABNORMAL LOW (ref 3.9–10.3)
lymph#: 1.5 10*3/uL (ref 0.9–3.3)

## 2015-05-27 LAB — LACTATE DEHYDROGENASE: LDH: 188 U/L (ref 125–245)

## 2015-05-27 MED ORDER — FERROUS SULFATE 325 (65 FE) MG PO TBEC: 325.0000 mg | DELAYED_RELEASE_TABLET | Freq: Two times a day (BID) | ORAL | Status: DC

## 2015-05-27 NOTE — Assessment & Plan Note (Addendum)
Differential diagnosis of the severe microcytic anemia is iron deficiency versus sideroblastic anemia versus anemia of chronic disease. In order to be thorough I would like to get  Hemoglobin electrophoresis, iron studies, D-63 folic acid, reticulocyte count. If there are no clear-cut evidence of iron deficiency, then we might have to perform a bone marrow biopsy for evaluation of sideroblastic anemia.  Lab work reviewed: Today's hemoglobin has improved from 4.9 on 05/08/2015 to 9.2 and MCV 75. We are waiting for today's signs studies. If she is profoundly iron deficient, I might consider giving her IV iron therapy. If she is only mildly iron deficient, then oral iron therapy would be adequate.  I called and discussed the case with Dr. Ruthann Cancer referring physician and discussed the plan. Return to clinic in 3 months for follow-up blood work and evaluation.

## 2015-05-27 NOTE — Telephone Encounter (Signed)
Patient sent back to lab. No f/u given at this time.

## 2015-05-27 NOTE — Progress Notes (Signed)
[facility] CONSULT NOTE  Patient Care Team: Frederico Hamman, MD as PCP - General (Obstetrics and Gynecology)  CHIEF COMPLAINTS/PURPOSE OF CONSULTATION:  Microcytic anemia  HISTORY OF PRESENTING ILLNESS:  [name] 40 y.o. female is here because of severe anemia diagnosis. It appears that the patient has had chronic anemia going back to several years. At least in our records patient was found to have anemia in 2008 with a hemoglobin of 10.5, in March 2014 hemoglobin was 6.7, in February 2016 hemoglobin was 8.1, and on 05/08/2015 the hemoglobin was 4.9. She was taking oral iron intermittently. Since the new diagnosis of anemia she is now taking it religiously. She reports that she does not have any shortness of breath or palpitations or dizziness. She does have chronic mild fatigue. She denies any blood in the stool or any heavy menstrual cycles. She has not had any colonoscopy or upper endoscopy.  I reviewed her records extensively and collaborated the history with the patient.  MEDICAL HISTORY:  Past Medical History  Diagnosis Date  . Asthma   . Uterine fibroid   . Gestational diabetes     SURGICAL HISTORY: Past Surgical History  Procedure Laterality Date  . Arm surgery    . Dilation and curettage of uterus    . Cesarean section      SOCIAL HISTORY: Social History   Social History  . Marital Status: Single    Spouse Name: N/A  . Number of Children: N/A  . Years of Education: N/A   Occupational History  . Not on file.   Social History Main Topics  . Smoking status: Never Smoker   . Smokeless tobacco: Not on file  . Alcohol Use: No  . Drug Use: No  . Sexual Activity: Yes    Birth Control/ Protection: None, Condom   Other Topics Concern  . Not on file   Social History Narrative    FAMILY HISTORY: Family History  Problem Relation Age of Onset  . Hypertension Mother   . Hypertension Brother   . Hypertension Maternal Uncle      ALLERGIES:  is allergic to shellfish allergy and other.  MEDICATIONS:  Current Outpatient Prescriptions  Medication Sig Dispense Refill  . ferrous sulfate 325 (65 FE) MG EC tablet Take 1 tablet (325 mg total) by mouth 2 (two) times daily.  3   No current facility-administered medications for this visit.    REVIEW OF SYSTEMS:   Constitutional: Denies fevers, chills or abnormal night sweats, complains of fatigue Eyes: Denies blurriness of vision, double vision or watery eyes Ears, nose, mouth, throat, and face: Denies mucositis or sore throat Respiratory: Denies cough, dyspnea or wheezes Cardiovascular: Denies palpitation, chest discomfort or lower extremity swelling Gastrointestinal:  Denies nausea, heartburn or change in bowel habits Skin: Denies abnormal skin rashes Lymphatics: Denies new lymphadenopathy or easy bruising Neurological:Denies numbness, tingling or new weaknesses Behavioral/Psych: Mood is stable, no new changes   All other systems were reviewed with the patient and are negative.  PHYSICAL EXAMINATION: ECOG PERFORMANCE STATUS: 1 - Symptomatic but completely ambulatory  Filed Vitals:   05/27/15 1350  BP: 143/62  Pulse: 68  Temp: 98.2 F (36.8 C)  Resp: 18   Filed Weights   05/27/15 1350  Weight: 175 lb (79.379 kg)    GENERAL:alert, no distress and comfortable SKIN: skin color, texture, turgor are normal, no rashes or significant lesions EYES: normal, conjunctiva are pink and non-injected, sclera clear OROPHARYNX:no exudate, no erythema  and lips, buccal mucosa, and tongue normal  NECK: supple, thyroid normal size, non-tender, without nodularity LYMPH:  no palpable lymphadenopathy in the cervical, axillary or inguinal LUNGS: clear to auscultation and percussion with normal breathing effort HEART: regular rate & rhythm and no murmurs and no lower extremity edema ABDOMEN:abdomen soft, non-tender and normal bowel sounds, no  hepatosplenomegaly Musculoskeletal:no cyanosis of digits and no clubbing  PSYCH: alert & oriented x 3 with fluent speech NEURO: no focal motor/sensory deficits  LABORATORY DATA:  I have reviewed the data as listed Lab Results  Component Value Date   WBC 3.5* 05/27/2015   HGB 9.2* 05/27/2015   HCT 32.5* 05/27/2015   MCV 75.8* 05/27/2015   PLT 349 05/27/2015   Lab Results  Component Value Date   NA 141 05/27/2015   K 4.0 05/27/2015   CL 106 04/22/2012   CO2 29 05/27/2015   RADIOGRAPHIC STUDIES: I have personally reviewed the radiological reports and agreed with the findings in the report.  ASSESSMENT AND PLAN:  Microcytic hypochromic anemia Differential diagnosis of the severe microcytic anemia is iron deficiency versus sideroblastic anemia versus anemia of chronic disease. In order to be thorough I would like to get  Hemoglobin electrophoresis, iron studies, F-42 folic acid, reticulocyte count. If there are no clear-cut evidence of iron deficiency, then we might have to perform a bone marrow biopsy for evaluation of sideroblastic anemia.  Lab work reviewed: Today's hemoglobin has improved from 4.9 on 05/08/2015 to 9.2 and MCV 75. We are waiting for today's iron studies. If she is profoundly iron deficient, I might consider giving her IV iron therapy. If she is only mildly iron deficient, then oral iron therapy would be adequate. Reticulocyte count is appropriately elevated suggesting good bone marrow recovery.  I called and discussed the case with Dr. Ruthann Cancer, referring physician and discussed the plan. I would like to consult gastroenterology for evaluation of chronic recurrent iron deficiency anemia.  Return to clinic in 3 months for follow-up blood work and evaluation.   All questions were answered. The patient knows to call the clinic with any problems, questions or concerns.    Rulon Eisenmenger, MD 05/27/2015

## 2015-05-28 ENCOUNTER — Telehealth: Payer: Self-pay | Admitting: Hematology and Oncology

## 2015-05-28 LAB — HEMOGLOBINOPATHY EVALUATION
HGB C: 0 %
HGB S: 0 %
Hemoglobin A2 Quantitation: 2.2 % (ref 0.7–3.1)
Hemoglobin F Quantitation: 0.7 % (ref 0.0–2.0)
Hgb A: 97.1 % (ref 94.0–98.0)

## 2015-05-28 LAB — IRON AND TIBC
%SAT: 54 % (ref 21–57)
Iron: 194 ug/dL — ABNORMAL HIGH (ref 41–142)
TIBC: 358 ug/dL (ref 236–444)
UIBC: 164 ug/dL (ref 120–384)

## 2015-05-28 LAB — FOLATE: Folate: >20 ng/mL

## 2015-05-28 LAB — HAPTOGLOBIN: Haptoglobin: 69 mg/dL (ref 34–200)

## 2015-05-28 LAB — VITAMIN B12: Vitamin B12: 867 pg/mL (ref 211–946)

## 2015-05-28 LAB — FERRITIN: Ferritin: 11 ng/ml (ref 9–269)

## 2015-05-28 NOTE — Telephone Encounter (Signed)
Spoke with patient to confirm 3 month fu appt 7/24 per pof

## 2015-08-04 ENCOUNTER — Encounter (HOSPITAL_COMMUNITY): Payer: Self-pay

## 2015-08-04 ENCOUNTER — Emergency Department (HOSPITAL_COMMUNITY)
Admission: EM | Admit: 2015-08-04 | Discharge: 2015-08-04 | Disposition: A | Discharge status: Home / Self Care | Payer: Medicaid Other | Attending: Emergency Medicine | Admitting: Emergency Medicine

## 2015-08-04 DIAGNOSIS — N946 Dysmenorrhea, unspecified: Secondary | ICD-10-CM | POA: Diagnosis not present

## 2015-08-04 DIAGNOSIS — J45909 Unspecified asthma, uncomplicated: Secondary | ICD-10-CM | POA: Insufficient documentation

## 2015-08-04 DIAGNOSIS — R1013 Epigastric pain: Secondary | ICD-10-CM

## 2015-08-04 DIAGNOSIS — E876 Hypokalemia: Secondary | ICD-10-CM | POA: Diagnosis not present

## 2015-08-04 LAB — COMPREHENSIVE METABOLIC PANEL
ALT: 16 U/L (ref 14–54)
AST: 38 U/L (ref 15–41)
Albumin: 3.4 g/dL — ABNORMAL LOW (ref 3.5–5.0)
Alkaline Phosphatase: 39 U/L (ref 38–126)
Anion gap: 7 (ref 5–15)
BUN: 7 mg/dL (ref 6–20)
CO2: 24 mmol/L (ref 22–32)
Calcium: 9.4 mg/dL (ref 8.9–10.3)
Chloride: 109 mmol/L (ref 101–111)
Creatinine, Ser: 0.67 mg/dL (ref 0.44–1.00)
GFR calc Af Amer: >60 mL/min (ref >60)
GFR calc non Af Amer: >60 mL/min (ref >60)
Glucose, Bld: 131 mg/dL — ABNORMAL HIGH (ref 65–99)
Potassium: 3.4 mmol/L — ABNORMAL LOW (ref 3.5–5.1)
Sodium: 140 mmol/L (ref 135–145)
Total Bilirubin: 0.5 mg/dL (ref 0.3–1.2)
Total Protein: 6.4 g/dL — ABNORMAL LOW (ref 6.5–8.1)

## 2015-08-04 LAB — CBC WITH DIFFERENTIAL/PLATELET
Basophils Absolute: 0 10*3/uL (ref 0.0–0.1)
Basophils Relative: 1 %
Eosinophils Absolute: 0.2 10*3/uL (ref 0.0–0.7)
Eosinophils Relative: 3 %
HCT: 40.2 % (ref 36.0–46.0)
Hemoglobin: 13 g/dL (ref 12.0–15.0)
Lymphocytes Relative: 28 %
Lymphs Abs: 1.2 10*3/uL (ref 0.7–4.0)
MCH: 28.8 pg (ref 26.0–34.0)
MCHC: 32.3 g/dL (ref 30.0–36.0)
MCV: 88.9 fL (ref 78.0–100.0)
Monocytes Absolute: 0.2 10*3/uL (ref 0.1–1.0)
Monocytes Relative: 5 %
Neutro Abs: 2.8 10*3/uL (ref 1.7–7.7)
Neutrophils Relative %: 63 %
Platelets: 180 10*3/uL (ref 150–400)
RBC: 4.52 MIL/uL (ref 3.87–5.11)
RDW: 14.4 % (ref 11.5–15.5)
WBC: 4.4 10*3/uL (ref 4.0–10.5)

## 2015-08-04 LAB — I-STAT TROPONIN, ED
Troponin i, poc: 0 ng/mL (ref 0.00–0.08)
Troponin i, poc: 0 ng/mL (ref 0.00–0.08)

## 2015-08-04 LAB — I-STAT BETA HCG BLOOD, ED (MC, WL, AP ONLY): I-stat hCG, quantitative: <5 m[IU]/mL (ref <5)

## 2015-08-04 LAB — LIPASE, BLOOD: Lipase: 29 U/L (ref 11–51)

## 2015-08-04 MED ORDER — POTASSIUM CHLORIDE CRYS ER 20 MEQ PO TBCR
40.0000 meq | EXTENDED_RELEASE_TABLET | Freq: Once | ORAL | Status: AC
Start: 2015-08-04 — End: 2015-08-04
  Administered 2015-08-04: 40 meq via ORAL
  Filled 2015-08-04: qty 2

## 2015-08-04 MED ORDER — ACETAMINOPHEN 325 MG PO TABS
650.0000 mg | ORAL_TABLET | Freq: Once | ORAL | Status: AC
  Administered 2015-08-04: 650 mg via ORAL
  Filled 2015-08-04: qty 2

## 2015-08-04 MED ORDER — GI COCKTAIL ~~LOC~~
30.0000 mL | Freq: Once | ORAL | Status: AC
  Administered 2015-08-04: 30 mL via ORAL
  Filled 2015-08-04: qty 30

## 2015-08-04 NOTE — Discharge Instructions (Signed)
Stay on clear liquids such as juices Jell-O or broth for the next 24 hours. Take Prilosec OTC as needed for discomfort. If discomfort in your abdomen continues, it should be be looked into further. Call the [facility] to get a primary care physician. Return if concerned for any reason

## 2015-08-04 NOTE — ED Provider Notes (Addendum)
CSN: [id number]     Arrival date & time 08/04/15  0808 History   First MD Initiated Contact with Patient 08/04/15 [phone]     Chief Complaint  Patient presents with  . epigastric pain      (Consider location/radiation/quality/duration/timing/severity/associated sxs/prior Treatment) HPI ComPlains of epigastric pain onset upon awakening 7:30 AM today. Discomfort is constant. Not made better or worse by anything. No other associated symptoms. No treatment prior to coming here. Nothing makes symptoms better or worse. Moderate at present has never had similar discomfort before. Last ate a chicken wrap and chips at 10:30 PM yesterday. No other associated symptoms. Last bowel movement yesterday, normal. Currently on menses. No urinary symptoms no fever no dyspnea. No nausea or vomiting Past Medical History  Diagnosis Date  . Asthma   . Uterine fibroid   . Gestational diabetes    Past Surgical History  Procedure Laterality Date  . Arm surgery    . Dilation and curettage of uterus    . Cesarean section     Family History  Problem Relation Age of Onset  . Hypertension Mother   . Hypertension Brother   . Hypertension Maternal Uncle    Social History  Substance Use Topics  . Smoking status: Never Smoker   . Smokeless tobacco: None  . Alcohol Use: No   OB History    Gravida Para Term Preterm AB TAB SAB Ectopic Multiple Living   4 3 2 1 1  1   2      Review of Systems  Constitutional: Negative.   HENT: Negative.   Respiratory: Negative.   Cardiovascular: Negative.   Gastrointestinal: Positive for abdominal pain.  Genitourinary:       Currently on menses  Musculoskeletal: Negative.   Skin: Negative.   Neurological: Negative.   Psychiatric/Behavioral: Negative.   All other systems reviewed and are negative.     Allergies  Shellfish allergy and Other  Home Medications   Prior to Admission medications   Medication Sig Start Date End Date Taking? Authorizing Provider  ferrous  sulfate 325 (65 FE) MG EC tablet Take 1 tablet (325 mg total) by mouth 2 (two) times daily. 05/27/15   Nicholas Lose, MD   BP 137/81 mmHg  Pulse 80  Temp(Src) 98.6 F (37 C)  Resp 18  Ht 5\' 4"  (1.626 m)  Wt 176 lb 3.2 oz (79.924 kg)  BMI 30.23 kg/m2  SpO2 100% Physical Exam  Constitutional: She appears well-developed and well-nourished. No distress.  HENT:  Head: Normocephalic and atraumatic.  Eyes: Conjunctivae are normal. Pupils are equal, round, and reactive to light.  Neck: Neck supple. No tracheal deviation present. No thyromegaly present.  Cardiovascular: Normal rate and regular rhythm.   No murmur heard. Pulmonary/Chest: Effort normal and breath sounds normal.  Abdominal: Soft. Bowel sounds are normal. She exhibits no distension. There is tenderness.  Minimally tender at epigastrium, right upper quadrant and left upper quadrant no guarding rigidity or rebound negative Murphy sign  Musculoskeletal: Normal range of motion. She exhibits no edema or tenderness.  Neurological: She is alert. Coordination normal.  Skin: Skin is warm and dry. No rash noted.  Psychiatric: She has a normal mood and affect.  Nursing note and vitals reviewed.   ED Course  Procedures (including critical care time) Labs Review Labs Reviewed - No data to display  Imaging Review No results found. I have personally reviewed and evaluated these images and lab results as part of my medical decision-making.  EKG Interpretation   Date/Time:  Sunday August 04 2015 08:12:03 EDT Ventricular Rate:  91 PR Interval:  166 QRS Duration: 82 QT Interval:  370 QTC Calculation: 455 R Axis:   49 Text Interpretation:  Normal sinus rhythm with sinus arrhythmia Right  atrial enlargement Borderline ECG No old tracing to compare Confirmed by  Shyonna Carlin  MD, Walden Statz [phone]) on 08/04/2015 8:31:07 AM     9:20 AM epigastric discomfort improved after treatment with GI cocktail. She now complains of menstrual cramps. Tylenol  ordered. 12:15 PM patient asymptomatic. Resting comfortably. Results for orders placed or performed during the hospital encounter of 08/04/15  Comprehensive metabolic panel  Result Value Ref Range   Sodium 140 135 - 145 mmol/L   Potassium 3.4 (L) 3.5 - 5.1 mmol/L   Chloride 109 101 - 111 mmol/L   CO2 24 22 - 32 mmol/L   Glucose, Bld 131 (H) 65 - 99 mg/dL   BUN 7 6 - 20 mg/dL   Creatinine, Ser 0.67 0.44 - 1.00 mg/dL   Calcium 9.4 8.9 - 10.3 mg/dL   Total Protein 6.4 (L) 6.5 - 8.1 g/dL   Albumin 3.4 (L) 3.5 - 5.0 g/dL   AST 38 15 - 41 U/L   ALT 16 14 - 54 U/L   Alkaline Phosphatase 39 38 - 126 U/L   Total Bilirubin 0.5 0.3 - 1.2 mg/dL   GFR calc non Af Amer >60 >60 mL/min   GFR calc Af Amer >60 >60 mL/min   Anion gap 7 5 - 15  CBC with Differential/Platelet  Result Value Ref Range   WBC 4.4 4.0 - 10.5 K/uL   RBC 4.52 3.87 - 5.11 MIL/uL   Hemoglobin 13.0 12.0 - 15.0 g/dL   HCT 40.2 36.0 - 46.0 %   MCV 88.9 78.0 - 100.0 fL   MCH 28.8 26.0 - 34.0 pg   MCHC 32.3 30.0 - 36.0 g/dL   RDW 14.4 11.5 - 15.5 %   Platelets 180 150 - 400 K/uL   Neutrophils Relative % 63 %   Neutro Abs 2.8 1.7 - 7.7 K/uL   Lymphocytes Relative 28 %   Lymphs Abs 1.2 0.7 - 4.0 K/uL   Monocytes Relative 5 %   Monocytes Absolute 0.2 0.1 - 1.0 K/uL   Eosinophils Relative 3 %   Eosinophils Absolute 0.2 0.0 - 0.7 K/uL   Basophils Relative 1 %   Basophils Absolute 0.0 0.0 - 0.1 K/uL  Lipase, blood  Result Value Ref Range   Lipase 29 11 - 51 U/L  I-stat troponin, ED  Result Value Ref Range   Troponin i, poc 0.00 0.00 - 0.08 ng/mL   Comment 3          I-Stat Beta hCG blood, ED ([facility], [facility], [facility] only)  Result Value Ref Range   I-stat hCG, quantitative <5.0 <5 mIU/mL   Comment 3          I-Stat Troponin, ED (not at [facility])  Result Value Ref Range   Troponin i, poc 0.00 0.00 - 0.08 ng/mL   Comment 3           No results found.  MDM  She received potassium chloride 40 mg prior to discharge. Plan referral [place]. Clear liquid diet for 24 hours. Prilosec OTC as needed.Heart score equals 1 Referral [place] Diagnosis #1 epigastric pain #2 dysmenorrhea #3 hypokalemia Final diagnoses:  None  Orlie Dakin, MD 08/04/15 1234  Orlie Dakin, MD 08/04/15 [phone]

## 2015-08-04 NOTE — ED Notes (Signed)
Patient complains of right sided epigastric pain since awakening this am 0730, nausea with same. States that she felt shortness of breath with same. NAD

## 2015-08-23 ENCOUNTER — Other Ambulatory Visit: Payer: Self-pay

## 2015-08-23 DIAGNOSIS — D509 Iron deficiency anemia, unspecified: Secondary | ICD-10-CM

## 2015-08-26 ENCOUNTER — Other Ambulatory Visit: Payer: Medicaid Other

## 2015-08-26 ENCOUNTER — Ambulatory Visit: Payer: Medicaid Other | Admitting: Hematology and Oncology

## 2015-08-26 NOTE — Assessment & Plan Note (Deleted)
Microcytic hypochromic anemia: Lab work done in April 2017 revealed 1. Hemoglobin electrophoresis: Normal  2. CBC with differential revealed a hemoglobin of 9.2, MCV 75.8, RDW 32.1, absolute reticulocyte count 138 with normal platelets and normal white blood cell count  3. Normal CMP 4. LDH 188, haptoglobin 69 5. Iron studies revealed TIBC 358, iron saturation 54%, ferritin 11 (suspicious for recently taken oral iron supplement) 6. 0000000 [id number], folic acid greater than 20

## 2015-12-30 ENCOUNTER — Inpatient Hospital Stay (HOSPITAL_COMMUNITY): Payer: Medicaid Other

## 2015-12-30 ENCOUNTER — Emergency Department (HOSPITAL_COMMUNITY): Payer: Medicaid Other

## 2015-12-30 ENCOUNTER — Inpatient Hospital Stay (HOSPITAL_COMMUNITY)
Admission: AD | Admit: 2015-12-30 | Discharge: 2016-01-01 | DRG: 872 | Disposition: A | Discharge status: Home / Self Care | Payer: Medicaid Other | Source: Ambulatory Visit | Attending: Internal Medicine | Admitting: Internal Medicine

## 2015-12-30 ENCOUNTER — Encounter (HOSPITAL_COMMUNITY): Payer: Self-pay | Admitting: *Deleted

## 2015-12-30 ENCOUNTER — Ambulatory Visit: Payer: Self-pay

## 2015-12-30 DIAGNOSIS — A599 Trichomoniasis, unspecified: Secondary | ICD-10-CM | POA: Diagnosis present

## 2015-12-30 DIAGNOSIS — A419 Sepsis, unspecified organism: Secondary | ICD-10-CM | POA: Diagnosis present

## 2015-12-30 DIAGNOSIS — Z91013 Allergy to seafood: Secondary | ICD-10-CM | POA: Diagnosis not present

## 2015-12-30 DIAGNOSIS — D649 Anemia, unspecified: Secondary | ICD-10-CM

## 2015-12-30 DIAGNOSIS — Z8249 Family history of ischemic heart disease and other diseases of the circulatory system: Secondary | ICD-10-CM

## 2015-12-30 DIAGNOSIS — R509 Fever, unspecified: Secondary | ICD-10-CM

## 2015-12-30 DIAGNOSIS — D509 Iron deficiency anemia, unspecified: Secondary | ICD-10-CM | POA: Diagnosis present

## 2015-12-30 DIAGNOSIS — Z91018 Allergy to other foods: Secondary | ICD-10-CM | POA: Diagnosis not present

## 2015-12-30 DIAGNOSIS — Z8632 Personal history of gestational diabetes: Secondary | ICD-10-CM

## 2015-12-30 DIAGNOSIS — J45909 Unspecified asthma, uncomplicated: Secondary | ICD-10-CM | POA: Diagnosis present

## 2015-12-30 DIAGNOSIS — N920 Excessive and frequent menstruation with regular cycle: Secondary | ICD-10-CM | POA: Diagnosis present

## 2015-12-30 DIAGNOSIS — D259 Leiomyoma of uterus, unspecified: Secondary | ICD-10-CM | POA: Diagnosis present

## 2015-12-30 DIAGNOSIS — R1030 Lower abdominal pain, unspecified: Secondary | ICD-10-CM

## 2015-12-30 DIAGNOSIS — Z79899 Other long term (current) drug therapy: Secondary | ICD-10-CM | POA: Diagnosis not present

## 2015-12-30 DIAGNOSIS — K8020 Calculus of gallbladder without cholecystitis without obstruction: Secondary | ICD-10-CM

## 2015-12-30 DIAGNOSIS — I10 Essential (primary) hypertension: Secondary | ICD-10-CM | POA: Diagnosis present

## 2015-12-30 DIAGNOSIS — IMO0001 Reserved for inherently not codable concepts without codable children: Secondary | ICD-10-CM

## 2015-12-30 HISTORY — DX: Anemia, unspecified: D64.9

## 2015-12-30 LAB — CBC WITH DIFFERENTIAL/PLATELET
Basophils Absolute: 0 10*3/uL (ref 0.0–0.1)
Basophils Absolute: 0 10*3/uL (ref 0.0–0.1)
Basophils Relative: 0 %
Basophils Relative: 0 %
Eosinophils Absolute: 0 10*3/uL (ref 0.0–0.7)
Eosinophils Absolute: 0 10*3/uL (ref 0.0–0.7)
Eosinophils Relative: 0 %
Eosinophils Relative: 0 %
HCT: 24.7 % — ABNORMAL LOW (ref 36.0–46.0)
HCT: 22.5 % — ABNORMAL LOW (ref 36.0–46.0)
Hemoglobin: 6.8 g/dL — CL (ref 12.0–15.0)
Hemoglobin: 6.1 g/dL — CL (ref 12.0–15.0)
Lymphocytes Relative: 4 %
Lymphocytes Relative: 11 %
Lymphs Abs: 0.4 10*3/uL — ABNORMAL LOW (ref 0.7–4.0)
Lymphs Abs: 1.3 10*3/uL (ref 0.7–4.0)
MCH: 19.3 pg — ABNORMAL LOW (ref 26.0–34.0)
MCH: 18.7 pg — ABNORMAL LOW (ref 26.0–34.0)
MCHC: 27.5 g/dL — ABNORMAL LOW (ref 30.0–36.0)
MCHC: 27.1 g/dL — ABNORMAL LOW (ref 30.0–36.0)
MCV: 70 fL — ABNORMAL LOW (ref 78.0–100.0)
MCV: 68.8 fL — ABNORMAL LOW (ref 78.0–100.0)
Monocytes Absolute: 0.5 10*3/uL (ref 0.1–1.0)
Monocytes Absolute: 0.6 10*3/uL (ref 0.1–1.0)
Monocytes Relative: 5 %
Monocytes Relative: 5 %
Neutro Abs: 10.9 10*3/uL — ABNORMAL HIGH (ref 1.7–7.7)
Neutro Abs: 9.8 10*3/uL — ABNORMAL HIGH (ref 1.7–7.7)
Neutrophils Relative %: 92 %
Neutrophils Relative %: 84 %
Platelets: 413 10*3/uL — ABNORMAL HIGH (ref 150–400)
Platelets: 340 10*3/uL (ref 150–400)
RBC: 3.53 MIL/uL — ABNORMAL LOW (ref 3.87–5.11)
RBC: 3.27 MIL/uL — ABNORMAL LOW (ref 3.87–5.11)
RDW: 19.1 % — ABNORMAL HIGH (ref 11.5–15.5)
RDW: 18.4 % — ABNORMAL HIGH (ref 11.5–15.5)
WBC: 11.9 10*3/uL — ABNORMAL HIGH (ref 4.0–10.5)
WBC: 11.7 10*3/uL — ABNORMAL HIGH (ref 4.0–10.5)

## 2015-12-30 LAB — I-STAT CG4 LACTIC ACID, ED: Lactic Acid, Venous: 1.97 mmol/L (ref 0.5–1.9)

## 2015-12-30 LAB — COMPREHENSIVE METABOLIC PANEL
ALT: 12 U/L — ABNORMAL LOW (ref 14–54)
AST: 20 U/L (ref 15–41)
Albumin: 3.5 g/dL (ref 3.5–5.0)
Alkaline Phosphatase: 35 U/L — ABNORMAL LOW (ref 38–126)
Anion gap: 10 (ref 5–15)
BUN: 10 mg/dL (ref 6–20)
CO2: 23 mmol/L (ref 22–32)
Calcium: 8.8 mg/dL — ABNORMAL LOW (ref 8.9–10.3)
Chloride: 102 mmol/L (ref 101–111)
Creatinine, Ser: 0.68 mg/dL (ref 0.44–1.00)
GFR calc Af Amer: >60 mL/min (ref >60)
GFR calc non Af Amer: >60 mL/min (ref >60)
Glucose, Bld: 148 mg/dL — ABNORMAL HIGH (ref 65–99)
Potassium: 3.3 mmol/L — ABNORMAL LOW (ref 3.5–5.1)
Sodium: 135 mmol/L (ref 135–145)
Total Bilirubin: 1 mg/dL (ref 0.3–1.2)
Total Protein: 6.7 g/dL (ref 6.5–8.1)

## 2015-12-30 LAB — APTT: aPTT: 22 seconds — ABNORMAL LOW (ref 24–36)

## 2015-12-30 LAB — WET PREP, GENITAL
Sperm: NONE SEEN
Trich, Wet Prep: NONE SEEN
Yeast Wet Prep HPF POC: NONE SEEN

## 2015-12-30 LAB — URINALYSIS, ROUTINE W REFLEX MICROSCOPIC
Bilirubin Urine: NEGATIVE
Glucose, UA: NEGATIVE mg/dL
Hgb urine dipstick: NEGATIVE
Ketones, ur: NEGATIVE mg/dL
Leukocytes, UA: NEGATIVE
Nitrite: NEGATIVE
Protein, ur: NEGATIVE mg/dL
Specific Gravity, Urine: 1.01 (ref 1.005–1.030)
pH: 6.5 (ref 5.0–8.0)

## 2015-12-30 LAB — LIPASE, BLOOD: Lipase: 20 U/L (ref 11–51)

## 2015-12-30 LAB — POCT PREGNANCY, URINE: Preg Test, Ur: NEGATIVE

## 2015-12-30 LAB — INFLUENZA PANEL BY PCR (TYPE A & B)
Influenza A By PCR: NEGATIVE
Influenza B By PCR: NEGATIVE

## 2015-12-30 LAB — PROTIME-INR
INR: 1.01
Prothrombin Time: 13.3 seconds (ref 11.4–15.2)

## 2015-12-30 LAB — PROCALCITONIN: Procalcitonin: 14.6 ng/mL

## 2015-12-30 LAB — LACTIC ACID, PLASMA
Lactic Acid, Venous: 2.9 mmol/L (ref 0.5–1.9)
Lactic Acid, Venous: 1.1 mmol/L (ref 0.5–1.9)

## 2015-12-30 MED ORDER — METRONIDAZOLE 500 MG PO TABS
500.0000 mg | ORAL_TABLET | Freq: Three times a day (TID) | ORAL | Status: DC
  Administered 2015-12-31 – 2016-01-01 (×3): 500 mg via ORAL
  Filled 2015-12-30 (×4): qty 1

## 2015-12-30 MED ORDER — SODIUM CHLORIDE 0.9 % IV BOLUS (SEPSIS)
1000.0000 mL | Freq: Once | INTRAVENOUS | Status: AC
  Administered 2015-12-30: 1000 mL via INTRAVENOUS

## 2015-12-30 MED ORDER — ACETAMINOPHEN 325 MG PO TABS
650.0000 mg | ORAL_TABLET | Freq: Four times a day (QID) | ORAL | Status: DC | PRN
  Administered 2016-01-01: 650 mg via ORAL
  Filled 2015-12-30: qty 2

## 2015-12-30 MED ORDER — SODIUM CHLORIDE 0.9 % IV BOLUS (SEPSIS)
1000.0000 mL | Freq: Once | INTRAVENOUS | Status: AC
  Administered 2015-12-31: 1000 mL via INTRAVENOUS

## 2015-12-30 MED ORDER — IOPAMIDOL (ISOVUE-300) INJECTION 61%
100.0000 mL | Freq: Once | INTRAVENOUS | Status: AC | PRN
  Administered 2015-12-30: 100 mL via INTRAVENOUS

## 2015-12-30 MED ORDER — ONDANSETRON HCL 4 MG/2ML IJ SOLN: 4.0000 mg | Freq: Four times a day (QID) | INTRAMUSCULAR | Status: DC | PRN

## 2015-12-30 MED ORDER — SODIUM CHLORIDE 0.9 % IV SOLN
INTRAVENOUS | Status: DC
  Administered 2015-12-30 (×2): via INTRAVENOUS

## 2015-12-30 MED ORDER — VANCOMYCIN HCL IN DEXTROSE 1-5 GM/200ML-% IV SOLN
1000.0000 mg | Freq: Three times a day (TID) | INTRAVENOUS | Status: DC
  Administered 2015-12-31 – 2016-01-01 (×3): 1000 mg via INTRAVENOUS
  Filled 2015-12-30 (×3): qty 200

## 2015-12-30 MED ORDER — ONDANSETRON HCL 4 MG PO TABS: 4.0000 mg | ORAL_TABLET | Freq: Four times a day (QID) | ORAL | Status: DC | PRN

## 2015-12-30 MED ORDER — PIPERACILLIN-TAZOBACTAM 3.375 G IVPB 30 MIN
3.3750 g | Freq: Once | INTRAVENOUS | Status: AC
Start: 2015-12-30 — End: 2015-12-30
  Administered 2015-12-30: 3.375 g via INTRAVENOUS
  Filled 2015-12-30: qty 50

## 2015-12-30 MED ORDER — SODIUM CHLORIDE 0.9 % IV SOLN
INTRAVENOUS | Status: AC
  Administered 2015-12-31 (×2): via INTRAVENOUS

## 2015-12-30 MED ORDER — VANCOMYCIN HCL IN DEXTROSE 1-5 GM/200ML-% IV SOLN
1000.0000 mg | Freq: Once | INTRAVENOUS | Status: AC
  Administered 2015-12-30: 1000 mg via INTRAVENOUS
  Filled 2015-12-30: qty 200

## 2015-12-30 MED ORDER — FERROUS SULFATE 325 (65 FE) MG PO TABS
325.0000 mg | ORAL_TABLET | Freq: Two times a day (BID) | ORAL | Status: DC
Start: 2015-12-30 — End: 2016-01-01
  Administered 2015-12-31 – 2016-01-01 (×2): 325 mg via ORAL
  Filled 2015-12-30 (×4): qty 1

## 2015-12-30 MED ORDER — IOPAMIDOL (ISOVUE-300) INJECTION 61%
INTRAVENOUS | Status: AC
  Filled 2015-12-30: qty 100

## 2015-12-30 MED ORDER — ACETAMINOPHEN 650 MG RE SUPP: 650.0000 mg | Freq: Four times a day (QID) | RECTAL | Status: DC | PRN

## 2015-12-30 MED ORDER — HYDROCODONE-ACETAMINOPHEN 5-325 MG PO TABS: 1.0000 | ORAL_TABLET | Freq: Four times a day (QID) | ORAL | Status: DC | PRN

## 2015-12-30 MED ORDER — SODIUM CHLORIDE 0.9 % IJ SOLN
INTRAMUSCULAR | Status: AC
  Filled 2015-12-30: qty 50

## 2015-12-30 MED ORDER — HYDROMORPHONE HCL 1 MG/ML IJ SOLN
1.0000 mg | Freq: Once | INTRAMUSCULAR | Status: AC
  Administered 2015-12-30: 1 mg via INTRAVENOUS
  Filled 2015-12-30: qty 1

## 2015-12-30 MED ORDER — PIPERACILLIN-TAZOBACTAM 3.375 G IVPB
3.3750 g | Freq: Three times a day (TID) | INTRAVENOUS | Status: DC
Start: 2015-12-31 — End: 2015-12-31
  Administered 2015-12-31: 3.375 g via INTRAVENOUS
  Filled 2015-12-30: qty 50

## 2015-12-30 MED ORDER — IBUPROFEN 200 MG PO TABS
600.0000 mg | ORAL_TABLET | Freq: Once | ORAL | Status: AC
  Administered 2015-12-30: 600 mg via ORAL
  Filled 2015-12-30: qty 3

## 2015-12-30 MED ORDER — ACETAMINOPHEN 325 MG PO TABS
650.0000 mg | ORAL_TABLET | Freq: Once | ORAL | Status: AC
  Administered 2015-12-30: 650 mg via ORAL
  Filled 2015-12-30: qty 2

## 2015-12-30 MED ORDER — ALBUTEROL SULFATE HFA 108 (90 BASE) MCG/ACT IN AERS: 2.0000 | INHALATION_SPRAY | Freq: Four times a day (QID) | RESPIRATORY_TRACT | Status: DC | PRN

## 2015-12-30 NOTE — MAU Note (Signed)
Lab called critical value of Hemoglobin 6.8; hematocrit is 24.7; M Williams CNM notified.

## 2015-12-30 NOTE — MAU Note (Signed)
Pt presents to MAU with complaints of lower abdominal discomfort. Reports chills started this morning with body aches. Denies any abnormal vaginal discharge. Irregular cycles

## 2015-12-30 NOTE — ED Notes (Signed)
[facility]t possible [facility].  Pt c/o abdominal pain and fever.  No medications were given for fever.  No antibiotics were given.     Blood work and urine sample completed at [facility].  WBC 11.9 and Hgb 6.8.

## 2015-12-30 NOTE — ED Provider Notes (Signed)
[name] DEPT Provider Note   CSN: [id number] Arrival date & time: 12/30/15  1204     History   Chief Complaint Chief Complaint  Patient presents with  . Abdominal Pain    HPI   Blood pressure 138/59, pulse 114, temperature 102 F (38.9 C), resp. rate 18, height 5\' 3"  (1.6 m), weight 83 kg, last menstrual period 10/30/2015, SpO2 100 %.  [name] is a 40 y.o. female complaining of severe lower abdominal pain with more frequent normally formed stool onset yesterday. Patient also reports some mild vaginal bleeding, she was seen and evaluated at [facility] hospital, found to be febrile, pelvic exam performed which was not consistent with PID, lactic acid was found to be elevated and patient was transferred to the emergency department for further evaluation of sepsis. Patient denies cough, rhinorrhea, sore throat, headache, cervicalgia, chest pain, shortness of breath, cough, increasing peripheral edema. She has her appendix and gallbladder. She denies abnormal vaginal discharge, dysuria, hematuria. She had single episode of emesis earlier in the day. She states her daughter was sick with a bellyache 1 week ago she denies fever nausea vomiting or diarrhea. No recent trips, no rashes. No medication at her baseline, she's never required blood transfusion in the past. An eyes history of IV drug use, HIV, diabetes or immunosuppression.    Past Medical History:  Diagnosis Date  . Anemia   . Asthma   . Gestational diabetes   . Uterine fibroid     Patient Active Problem List   Diagnosis Date Noted  . Microcytic hypochromic anemia 05/27/2015    Past Surgical History:  Procedure Laterality Date  . arm surgery    . CESAREAN SECTION    . DILATION AND CURETTAGE OF UTERUS    . TOOTH EXTRACTION      OB History    Gravida Para Term Preterm AB Living   4 3 2 1 1 2    SAB TAB Ectopic Multiple Live Births   1       3       Home Medications    Prior to Admission medications     Medication Sig Start Date End Date Taking? Authorizing Provider  ferrous sulfate 325 (65 FE) MG EC tablet Take 1 tablet (325 mg total) by mouth 2 (two) times daily. 05/27/15   Nicholas Lose, MD    Family History Family History  Problem Relation Age of Onset  . Hypertension Mother   . Hypertension Brother   . Hypertension Maternal Uncle     Social History Social History  Substance Use Topics  . Smoking status: Never Smoker  . Smokeless tobacco: Never Used  . Alcohol use No     Allergies   Shellfish allergy and Other   Review of Systems Review of Systems  10 systems reviewed and found to be negative, except as noted in the HPI.   Physical Exam Updated Vital Signs BP 138/59   Pulse 114   Temp 102 F (38.9 C)   Resp 18   Ht 5\' 3"  (1.6 m)   Wt 83 kg   LMP 10/30/2015   SpO2 100%   BMI 32.42 kg/m   Physical Exam  Constitutional: She is oriented to person, place, and time. She appears well-developed and well-nourished. No distress.  HENT:  Head: Normocephalic and atraumatic.  Right Ear: External ear normal.  Left Ear: External ear normal.  Mouth/Throat: Oropharynx is clear and moist. No oropharyngeal exudate.  No drooling or stridor. Posterior  pharynx mildly erythematous no significant tonsillar hypertrophy. No exudate. Soft palate rises symmetrically. No TTP or induration under tongue.   No tenderness to palpation of frontal or bilateral maxillary sinuses.  Mild mucosal edema in the nares with scant rhinorrhea.  Bilateral tympanic membranes with normal architecture and good light reflex.    Eyes: Conjunctivae and EOM are normal. Pupils are equal, round, and reactive to light.  Neck: Normal range of motion. Neck supple.  FROM to C-spine. Pt can touch chin to chest without discomfort. No TTP of midline cervical spine.   Cardiovascular: Normal rate, regular rhythm and intact distal pulses.   Mild tachycardia with no murmur  Pulmonary/Chest: Effort normal and  breath sounds normal. No stridor. No respiratory distress. She has no wheezes. She has no rales. She exhibits no tenderness.  Abdominal: Soft. She exhibits distension. She exhibits no mass. There is tenderness. There is no rebound and no guarding. No hernia.  Protuberant abdomen, mild tenderness palpation in the bilateral lower quadrants. No guarding or rebound.  Musculoskeletal: Normal range of motion.  Neurological: She is alert and oriented to person, place, and time.  Skin: Capillary refill takes less than 2 seconds. She is not diaphoretic.  Psychiatric: She has a normal mood and affect.  Nursing note and vitals reviewed.    ED Treatments / Results  Labs (all labs ordered are listed, but only abnormal results are displayed) Labs Reviewed  WET PREP, GENITAL - Abnormal; Notable for the following:       Result Value   Clue Cells Wet Prep HPF POC PRESENT (*)    WBC, Wet Prep HPF POC FEW (*)    All other components within normal limits  CBC WITH DIFFERENTIAL/PLATELET - Abnormal; Notable for the following:    WBC 11.9 (*)    RBC 3.53 (*)    Hemoglobin 6.8 (*)    HCT 24.7 (*)    MCV 70.0 (*)    MCH 19.3 (*)    MCHC 27.5 (*)    RDW 19.1 (*)    Platelets 413 (*)    Neutro Abs 10.9 (*)    Lymphs Abs 0.4 (*)    All other components within normal limits  COMPREHENSIVE METABOLIC PANEL - Abnormal; Notable for the following:    Potassium 3.3 (*)    Glucose, Bld 148 (*)    Calcium 8.8 (*)    ALT 12 (*)    Alkaline Phosphatase 35 (*)    All other components within normal limits  LACTIC ACID, PLASMA - Abnormal; Notable for the following:    Lactic Acid, Venous 2.9 (*)    All other components within normal limits  APTT - Abnormal; Notable for the following:    aPTT 22 (*)    All other components within normal limits  I-STAT CG4 LACTIC ACID, ED - Abnormal; Notable for the following:    Lactic Acid, Venous 1.97 (*)    All other components within normal limits  CULTURE, BLOOD (ROUTINE  X 2)  CULTURE, BLOOD (ROUTINE X 2)  URINE CULTURE  URINALYSIS, ROUTINE W REFLEX MICROSCOPIC (NOT AT [facility])  PROCALCITONIN  PROTIME-INR  INFLUENZA PANEL BY PCR (TYPE A & B, H1N1)  LIPASE, BLOOD  HIV ANTIBODY (ROUTINE TESTING)  POCT PREGNANCY, URINE  I-STAT CG4 LACTIC ACID, ED  TYPE AND SCREEN  GC/CHLAMYDIA PROBE AMP ([place]) NOT AT [facility]    EKG  EKG Interpretation None       Radiology Dg Chest [facility] 1 View  Result  Date: 12/30/2015 CLINICAL DATA:  Sepsis, 102(F)degree fever, Pt presents to MAU with complaints of lower abdominal discomfort. Reports chills started this morning with body aches. Nonsmoker, no priors EXAM: PORTABLE CHEST 1 VIEW COMPARISON:  None. FINDINGS: Heart size is accentuated by the AP sitting position of the patient. Lungs are free of focal consolidations and pleural effusions. No pulmonary edema. IMPRESSION: No evidence for acute cardiopulmonary abnormality. Electronically Signed   By: Nolon Nations M.D.   On: 12/30/2015 15:00    Procedures Procedures (including critical care time)  Medications Ordered in ED Medications  0.9 %  sodium chloride infusion ( Intravenous New Bag/Given 12/30/15 1550)  piperacillin-tazobactam (ZOSYN) IVPB 3.375 g (not administered)  vancomycin (VANCOCIN) IVPB 1000 mg/200 mL premix (not administered)  sodium chloride 0.9 % bolus 1,000 mL (not administered)    And  sodium chloride 0.9 % bolus 1,000 mL (not administered)    And  sodium chloride 0.9 % bolus 1,000 mL (not administered)  HYDROmorphone (DILAUDID) injection 1 mg (1 mg Intravenous Given 12/30/15 1713)     Initial Impression / Assessment and Plan / ED Course  I have reviewed the triage vital signs and the nursing notes.  Pertinent labs & imaging results that were available during my care of the patient were reviewed by me and considered in my medical decision making (see chart for details).  Clinical Course     Vitals:   12/30/15 1240 12/30/15 1242  12/30/15 1606 12/30/15 1751  BP: 164/86  124/67 138/59  Pulse: 116  111 114  Resp: 18  18 18   Temp: 102.9 F (39.4 C)  101.9 F (38.8 C) 102 F (38.9 C)  TempSrc: Oral  Oral   SpO2:  100%  100%  Weight: 83 kg     Height: 5\' 3"  (1.6 m)       Medications  0.9 %  sodium chloride infusion ( Intravenous New Bag/Given 12/30/15 1550)  piperacillin-tazobactam (ZOSYN) IVPB 3.375 g (not administered)  vancomycin (VANCOCIN) IVPB 1000 mg/200 mL premix (not administered)  sodium chloride 0.9 % bolus 1,000 mL (not administered)    And  sodium chloride 0.9 % bolus 1,000 mL (not administered)    And  sodium chloride 0.9 % bolus 1,000 mL (not administered)  acetaminophen (TYLENOL) tablet 650 mg (not administered)  HYDROmorphone (DILAUDID) injection 1 mg (1 mg Intravenous Given 12/30/15 1713)    MKENZIE TACKER is 40 y.o. [facility] fever of unclear origin, she's reporting Vaginal bleeding, normally formed normally colored stool with slight frequency and stool production. She had a single episode of emesis earlier in the day. Patient has been febrile to 102.9, she is tachycardic. Pelvic exam was performed at [facility] hospital and was non-concerning for PID. Patient has her appendix and gallbladder, abdominal exam is nonsurgical, will start broad-spectrum antibiotics and obtain CT abdomen pelvis. She is anemic with hemoglobin of 6.8. She's never required a transfusion but has no religious objections to transfusions. She is not reporting any shortness of breath palpitations or chest pain, will hold transfusion. Mild leukocytosis of 11.9. Blood work with improving lactic acidemia to 1.9. 30 mL per KG bolus ordered.  Repeat abdominal exam remains unchanged. CT without acute abnormality, very large fibroids with possible degeneration. Unclear source of her fever.  Case discussed with triad hospitalist Dr. Hal Hope who requested I speak with OB/GYN to see if a  degenerative fibroid could become necrotic and possibly the source of her infection.  OB/GYN  consult from Dr. Rip Harbour appreciated: States that although not impossible to be highly unlikely that a degenerative fibroid could cause a fever of 102 with tachycardia and present a sepsis-like picture.        Final Clinical Impressions(s) / ED Diagnoses   Final diagnoses:  Fever, unknown origin  Lower abdominal pain    New Prescriptions New Prescriptions   No medications on file     Monico Blitz, PA-C 12/30/15 2046    Davonna Belling, MD 12/31/15 [phone]

## 2015-12-30 NOTE — MAU Provider Note (Signed)
Chief Complaint:  Abdominal Pain   First Provider Initiated Contact with Patient 12/30/15 1402      HPI: [name] is a 40 y.o. [id number] who presents to maternity admissions reporting lower abdominal pain today.  Not as much pain yesterday. Has had some irregular light bleeding over the past 2 weeks, only spotting now.  Known large fibroids x 4.  Also has chills and aches since this morning.  Last IC over a month ago, she does not suspect STDs. She reports vaginal bleeding, but no vaginal itching/burning, urinary symptoms, h/a, dizziness, n/v.    Also had 4 stools today but they were not loose. A little dysuria but no flank pain. One family member has some diarrhea.   Abdominal Pain  This is a recurrent problem. The current episode started in the past 7 days. The onset quality is gradual. The problem occurs constantly. The problem has been unchanged. The pain is located in the LLQ, RLQ and suprapubic region. The pain is moderate. The quality of the pain is aching, dull and cramping. The abdominal pain does not radiate. Associated symptoms include diarrhea (No LOOSE stools but had 4 BMs today), a fever and myalgias. Pertinent negatives include no constipation, frequency (But has some pain with urination), headaches, nausea or vomiting. Nothing aggravates the pain. The pain is relieved by nothing. She has tried nothing for the symptoms.   RN Note: Pt presents to MAU with complaints of lower abdominal discomfort. Reports chills started this morning with body aches. Denies any abnormal vaginal discharge. Irregular cycles  Past Medical History: Past Medical History:  Diagnosis Date  . Anemia   . Asthma   . Gestational diabetes   . Uterine fibroid     Past obstetric history: OB History  Gravida Para Term Preterm AB Living  4 3 2 1 1 2   SAB TAB Ectopic Multiple Live Births  1       3    # Outcome Date GA Lbr Len/2nd Weight Sex Delivery Anes PTL Lv  4 SAB           3 Term     F  CS-LTranv   LIV     Birth Comments: compound presentation  2 Term      Vag-Spont   LIV  1 Preterm  106w0d       ND     Birth Comments: twins      Past Surgical History: Past Surgical History:  Procedure Laterality Date  . arm surgery    . CESAREAN SECTION    . DILATION AND CURETTAGE OF UTERUS    . TOOTH EXTRACTION      Family History: Family History  Problem Relation Age of Onset  . Hypertension Mother   . Hypertension Brother   . Hypertension Maternal Uncle     Social History: Social History  Substance Use Topics  . Smoking status: Never Smoker  . Smokeless tobacco: Never Used  . Alcohol use No    Allergies:  Allergies  Allergen Reactions  . Shellfish Allergy Itching and Swelling    Throat swells   . Other Itching and Swelling    Hazelnut    Meds:  Prescriptions Prior to Admission  Medication Sig Dispense Refill Last Dose  . ferrous sulfate 325 (65 FE) MG EC tablet Take 1 tablet (325 mg total) by mouth 2 (two) times daily.  3 08/04/2015 at Unknown time    I have reviewed patient's Past Medical Hx, Surgical Hx, Family  Hx, Social Hx, medications and allergies.  ROS:  Review of Systems  Constitutional: Positive for fever.  HENT: Positive for sneezing. Negative for congestion, sinus pressure and sore throat.   Eyes: Negative for photophobia.  Respiratory: Negative for shortness of breath.   Gastrointestinal: Positive for abdominal pain and diarrhea (No LOOSE stools but had 4 BMs today). Negative for constipation, nausea and vomiting.  Genitourinary: Positive for pelvic pain and vaginal bleeding. Negative for frequency (But has some pain with urination) and vaginal discharge.  Musculoskeletal: Positive for myalgias. Negative for back pain.  Neurological: Negative for headaches.   Other systems negative     Physical Exam  Patient Vitals for the past 24 hrs:  BP Temp Temp src Pulse Resp SpO2 Height Weight  12/30/15 1242 - - - - - 100 % - -  12/30/15 1240  164/86 102.9 F (39.4 C) Oral 116 18 - 5\' 3"  (1.6 m) 183 lb (83 kg)    Constitutional: Well-developed, well-nourished female in no acute distress, but ill-appearing.  Cardiovascular: tachycardia (115-120) normal rhythm, no ectopy audible, S1 & S2 heard, no murmur Respiratory: normal effort, no distress. Lungs CTAB with no wheezes or crackles GI: Abd soft, tender over lower abdomen.  Nondistended, but uterus is enlarged.  No rebound, No guarding.  Bowel Sounds audible  MS: Extremities nontender, no edema, normal ROM Neurologic: Alert and oriented x 4.   Grossly nonfocal. GU: Neg CVAT. Skin:  Warm and Dry Psych:  Affect appropriate.  PELVIC EXAM: scant pinkish red discharge, vaginal walls and external genitalia normal Bimanual exam: Cervix firm, anterior, neg CMT, uterus tender, enlarged to 20 week size, firm,  adnexa without tenderness, enlargement, or mass    Labs: Results for orders placed or performed during the hospital encounter of 12/30/15 (from the past 24 hour(s))  Urinalysis, Routine w reflex microscopic (not at [facility])     Status: None   Collection Time: 12/30/15 12:58 PM  Result Value Ref Range   Color, Urine YELLOW YELLOW   APPearance CLEAR CLEAR   Specific Gravity, Urine 1.010 1.005 - 1.030   pH 6.5 5.0 - 8.0   Glucose, UA NEGATIVE NEGATIVE mg/dL   Hgb urine dipstick NEGATIVE NEGATIVE   Bilirubin Urine NEGATIVE NEGATIVE   Ketones, ur NEGATIVE NEGATIVE mg/dL   Protein, ur NEGATIVE NEGATIVE mg/dL   Nitrite NEGATIVE NEGATIVE   Leukocytes, UA NEGATIVE NEGATIVE  Pregnancy, urine POC     Status: None   Collection Time: 12/30/15  1:16 PM  Result Value Ref Range   Preg Test, Ur NEGATIVE NEGATIVE  Wet prep, genital     Status: Abnormal   Collection Time: 12/30/15  2:02 PM  Result Value Ref Range   Yeast Wet Prep HPF POC NONE SEEN NONE SEEN   Trich, Wet Prep NONE SEEN NONE SEEN   Clue Cells Wet Prep HPF POC PRESENT (A) NONE SEEN   WBC, Wet Prep HPF POC FEW (A) NONE SEEN    Sperm NONE SEEN   CBC with Differential     Status: Abnormal   Collection Time: 12/30/15  2:12 PM  Result Value Ref Range   WBC 11.9 (H) 4.0 - 10.5 K/uL   RBC 3.53 (L) 3.87 - 5.11 MIL/uL   Hemoglobin 6.8 (LL) 12.0 - 15.0 g/dL   HCT 24.7 (L) 36.0 - 46.0 %   MCV 70.0 (L) 78.0 - 100.0 fL   MCH 19.3 (L) 26.0 - 34.0 pg   MCHC 27.5 (L) 30.0 - 36.0 g/dL  RDW 19.1 (H) 11.5 - 15.5 %   Platelets 413 (H) 150 - 400 K/uL   Neutrophils Relative % 92 %   Neutro Abs 10.9 (H) 1.7 - 7.7 K/uL   Lymphocytes Relative 4 %   Lymphs Abs 0.4 (L) 0.7 - 4.0 K/uL   Monocytes Relative 5 %   Monocytes Absolute 0.5 0.1 - 1.0 K/uL   Eosinophils Relative 0 %   Eosinophils Absolute 0.0 0.0 - 0.7 K/uL   Basophils Relative 0 %   Basophils Absolute 0.0 0.0 - 0.1 K/uL  Protime-INR     Status: None   Collection Time: 12/30/15  2:12 PM  Result Value Ref Range   Prothrombin Time 13.3 11.4 - 15.2 seconds   INR 1.01   APTT     Status: Abnormal   Collection Time: 12/30/15  2:12 PM  Result Value Ref Range   aPTT 22 (L) 24 - 36 seconds    Imaging:  Previous US in April 2017 Dominant fibroids include:  --8.1 x 7.5 x 7.4 cm submucosal/intracavitary fibroid in the uterine body, previously 8.5 cm x 7.8 x 6.5 cm  --2.8 x 2.8 x 2.5 cm subserosal calcified uterine fibroid in the anterior uterine fundus  --4.7 x 4.5 x 4.1 cm partially calcified/degenerating subserosal fibroid in the right uterine fundus  --4.4 x 3.5 x 3.6 cm calcified subserosal fibroid in the right posterior uterine fundus  --6.2 x 5.7 x 5.4 cm subserosal fibroid in the anterior left uterine body Dg Chest Port 1 View  Result Date: 12/30/2015 CLINICAL DATA:  Sepsis, 102(F)degree fever, Pt presents to MAU with complaints of lower abdominal discomfort. Reports chills started this morning with body aches. Nonsmoker, no priors EXAM: PORTABLE CHEST 1 VIEW COMPARISON:  None. FINDINGS: Heart size is accentuated by the AP sitting position of the  patient. Lungs are free of focal consolidations and pleural effusions. No pulmonary edema. IMPRESSION: No evidence for acute cardiopulmonary abnormality. Electronically Signed   By: Nolon Nations M.D.   On: 12/30/2015 15:00    MAU Course/MDM: I have ordered labs as follows: Per Sepsis protocol Imaging ordered: CXR per protocol Results reviewed.   Consult Dr Rip Harbour.  He recommends getting initial workup done including GYN eval and UA, then transferring to other ED for further evaluation and treatment of possible sepsis.  He states there is no evidence of PID or other obvious GYN source of fever.    .    WBC is elevated as is her Neutrophil count.   Platelets normal.  PT is slightly low, PTT normal with normal INR.  Urine is clear. Wet prep shows some clue cells, which would not cause fever.  CXR negative  Pt stable at time of transfer  Assessment: Abdominal pain Fever Rule out sepsis Known preexisting fibroids and dysfunctional uterine bleeding  Plan: Discussed with Dr Rip Harbour who recommends transfer to [facility] Laverta [place] accepts care IV started Antibiotics have not been given Lactate and other lab results pending [facility] to transfer  Hansel Feinstein CNM, MSN Certified Nurse-Midwife 12/30/2015 2:02 PM  While waitiing for [facility], Lactic Acid and Procalcitonin resulted   Ref. Range 12/30/2015 14:12  Lactic Acid, Venous Latest Ref Range: 0.5 - 1.9 mmol/L 2.9 (HH)  Procalcitonin Latest Units: ng/mL 14.60   Seabron Spates, CNM

## 2015-12-30 NOTE — ED Notes (Signed)
Bed: [id number] Expected date:  Expected time:  Means of arrival:  Comments: Pt from [facility]

## 2015-12-30 NOTE — Progress Notes (Signed)
Pharmacy Antibiotic Note  [name] is a 40 y.o. female admitted on 12/30/2015 with abdominal pain and fever.  Pharmacy has been consulted for Vancomycin and Zosyn dosing for sepsis.  No antibiotic allergies noted.  First doses ordered in ED.  CrCl ~ 93 ml/min CG (N >100)  Plan: Zosyn 3.375g IV q8h (infuse over 4 hours) Vancomycin 1g IV q8h F/u renal function, cultures, VT at Css as warranted, clinical course  Height: 5\' 3"  (160 cm) Weight: 183 lb (83 kg) IBW/kg (Calculated) : 52.4  Temp (24hrs), Avg:102.3 F (39.1 C), Min:101.9 F (38.8 C), Max:102.9 F (39.4 C)   Recent Labs Lab 12/30/15 1412  WBC 11.9*  CREATININE 0.68  LATICACIDVEN 2.9*    Estimated Creatinine Clearance: 93.4 mL/min (by C-G formula based on SCr of 0.68 mg/dL).    Allergies  Allergen Reactions  . Shellfish Allergy Itching and Swelling    Throat swells   . Other Itching and Swelling    Hazelnut    Antimicrobials this admission: 11/27 Vancomycin >>  11/27 Zosyn >>   Dose adjustments this admission:  Microbiology results: 11/27 BCx: collected 11/27 UCx: collected 11/27 Wet prep: Clue cells present, few WBC  11/27 Influenza A/B negative  Thank you for allowing pharmacy to be a part of this patient's care.  Ralene Bathe, PharmD, BCPS 12/30/2015, 6:20 PM  Pager: [phone]

## 2015-12-30 NOTE — H&P (Signed)
History and Physical    [name] [id number] DOB: Jul 19, 1975 DOA: 12/30/2015  PCP: Frederico Hamman, MD (Inactive)  Patient coming from: Home.  Chief Complaint: Lower abdominal pain.  HPI: [name] is a 40 y.o. female with History of asthma presents to the ER because of lower abdominal pain. Patient first presented to the ER at [facility]. Patient was found to be febrile with temperatures around 103F and patient was transferred to [facility]. CT of the abdomen and pelvis shows large fibroids with one of them showing degeneration. Chest x-ray UA are unremarkable. Source of fever is not clear. Procalcitonin is elevated. Patient is being admitted for sepsis from unknown source. Wet prep shows clue cells. Patient's labs also show hemoglobin of around 6 and patient has known history of anemia but has not been so low. Patient states recently has been having menorrhagia.Denies any bleeding per rectum. Denies using any NSAIDs or Goody powder.  CT scan also shows gallstones but patient denies any nausea vomiting or diarrhea.  ED Course: Fluid bolus was given for sepsis protocol blood cultures were sent and patient was placed on empiric antibiotics. Influenza PCR is negative.  Review of Systems: As per HPI, rest all negative.   Past Medical History:  Diagnosis Date  . Anemia   . Asthma   . Gestational diabetes   . Uterine fibroid     Past Surgical History:  Procedure Laterality Date  . arm surgery    . CESAREAN SECTION    . DILATION AND CURETTAGE OF UTERUS    . TOOTH EXTRACTION       reports that she has never smoked. She has never used smokeless tobacco. She reports that she does not drink alcohol or use drugs.  Allergies  Allergen Reactions  . Shellfish Allergy Itching and Swelling    Throat swells   . Other Itching and Swelling    Hazelnut    Family History  Problem Relation Age of Onset  . Hypertension Mother   . Hypertension Brother     . Hypertension Maternal Uncle     Prior to Admission medications   Medication Sig Start Date End Date Taking? Authorizing Provider  albuterol (PROVENTIL HFA;VENTOLIN HFA) 108 (90 Base) MCG/ACT inhaler Inhale 2 puffs into the lungs every 6 (six) hours as needed for wheezing or shortness of breath.   Yes Historical Provider, MD  ferrous sulfate 325 (65 FE) MG EC tablet Take 1 tablet (325 mg total) by mouth 2 (two) times daily. 05/27/15  Yes Nicholas Lose, MD  HYDROcodone-acetaminophen (NORCO/VICODIN) 5-325 MG tablet Take 1 tablet by mouth every 6 (six) hours as needed for moderate pain.   Yes Historical Provider, MD  amoxicillin (AMOXIL) 500 MG capsule Take 500 mg by mouth 3 (three) times daily.    Historical Provider, MD    Physical Exam: Vitals:   12/30/15 1938 12/30/15 1939 12/30/15 2036 12/30/15 2110  BP: 128/61 128/61 118/63 112/75  Pulse: 120 110 111 112  Resp: 20 16 15 16   Temp:  100.7 F (38.2 C)    TempSrc:  Oral    SpO2: 100% 100% 100% 100%  Weight:      Height:          Constitutional: Moderately built and nourished. Vitals:   12/30/15 1938 12/30/15 1939 12/30/15 2036 12/30/15 2110  BP: 128/61 128/61 118/63 112/75  Pulse: 120 110 111 112  Resp: 20 16 15 16   Temp:  100.7 F (38.2 C)  TempSrc:  Oral    SpO2: 100% 100% 100% 100%  Weight:      Height:       Eyes: Anicteric. Pallor present. ENMT: No discharge from the ears eyes nose or mouth. Neck: No mass felt. No neck rigidity. Respiratory: No rhonchi or crepitations. Cardiovascular: S1-S2 heard. No murmurs appreciated. Abdomen: Soft Mildly distended with some tenderness in the lower quadrants. Musculoskeletal: No edema. No joint effusion. Skin: No rash. Neurologic:Alert awake oriented to time place and person. Moves all extremities. Psychiatric: Appears normal.   Labs on Admission: I have personally reviewed following labs and imaging studies  CBC:  Recent Labs Lab 12/30/15 1412  WBC 11.9*   NEUTROABS 10.9*  HGB 6.8*  HCT 24.7*  MCV 70.0*  PLT 123XX123*   Basic Metabolic Panel:  Recent Labs Lab 12/30/15 1412  NA 135  K 3.3*  CL 102  CO2 23  GLUCOSE 148*  BUN 10  CREATININE 0.68  CALCIUM 8.8*   GFR: Estimated Creatinine Clearance: 93.4 mL/min (by C-G formula based on SCr of 0.68 mg/dL). Liver Function Tests:  Recent Labs Lab 12/30/15 1412  AST 20  ALT 12*  ALKPHOS 35*  BILITOT 1.0  PROT 6.7  ALBUMIN 3.5    Recent Labs Lab 12/30/15 1800  LIPASE 20   No results for input(s): AMMONIA in the last 168 hours. Coagulation Profile:  Recent Labs Lab 12/30/15 1412  INR 1.01   Cardiac Enzymes: No results for input(s): CKTOTAL, CKMB, CKMBINDEX, TROPONINI in the last 168 hours. BNP (last 3 results) No results for input(s): PROBNP in the last 8760 hours. HbA1C: No results for input(s): HGBA1C in the last 72 hours. CBG: No results for input(s): GLUCAP in the last 168 hours. Lipid Profile: No results for input(s): CHOL, HDL, LDLCALC, TRIG, CHOLHDL, LDLDIRECT in the last 72 hours. Thyroid Function Tests: No results for input(s): TSH, T4TOTAL, FREET4, T3FREE, THYROIDAB in the last 72 hours. Anemia Panel: No results for input(s): VITAMINB12, FOLATE, FERRITIN, TIBC, IRON, RETICCTPCT in the last 72 hours. Urine analysis:    Component Value Date/Time   COLORURINE YELLOW 12/30/2015 [place] 12/30/2015 1258   LABSPEC 1.010 12/30/2015 1258   PHURINE 6.5 12/30/2015 1258   GLUCOSEU NEGATIVE 12/30/2015 1258   HGBUR NEGATIVE 12/30/2015 1258   [place] 12/30/2015 1258   KETONESUR NEGATIVE 12/30/2015 1258   PROTEINUR NEGATIVE 12/30/2015 1258   UROBILINOGEN 0.2 11/01/2014 0957   NITRITE NEGATIVE 12/30/2015 1258   LEUKOCYTESUR NEGATIVE 12/30/2015 1258   Sepsis Labs: @LABRCNTIP (procalcitonin:4,lacticidven:4) ) Recent Results (from the past 240 hour(s))  Wet prep, genital     Status: Abnormal   Collection Time: 12/30/15  2:02 PM   Result Value Ref Range Status   Yeast Wet Prep HPF POC NONE SEEN NONE SEEN Final   Trich, Wet Prep NONE SEEN NONE SEEN Final   Clue Cells Wet Prep HPF POC PRESENT (A) NONE SEEN Final   WBC, Wet Prep HPF POC FEW (A) NONE SEEN Final    Comment: MANY BACTERIA SEEN   Sperm NONE SEEN  Final     Radiological Exams on Admission: Ct Abdomen Pelvis W Contrast  Result Date: 12/30/2015 CLINICAL DATA:  Lower abdominal pain irregular bleeding EXAM: CT ABDOMEN AND PELVIS WITH CONTRAST TECHNIQUE: Multidetector CT imaging of the abdomen and pelvis was performed using the standard protocol following bolus administration of intravenous contrast. CONTRAST:  125mL ISOVUE-300 IOPAMIDOL (ISOVUE-300) INJECTION 61% COMPARISON:  Ultrasound 05/23/2015 FINDINGS: Lower chest: No acute consolidation or  pleural effusion at the lung base. Heart size normal. Hepatobiliary: Multiple calcified stones within the gallbladder. No wall thickening. Minimal intra hepatic biliary dilatation. Small focal hypodensity within the central right hepatic lobe could relate to a mildly dilated duct or a sub cm nonspecific hypodense lesion. Extrahepatic common bowel duct does not appear enlarged. Pancreas: Unremarkable. No pancreatic ductal dilatation or surrounding inflammatory changes. Spleen: Upper normal in size, measuring 12 cm. No focal abnormality. Adrenals/Urinary Tract: Adrenal glands are unremarkable. Kidneys are normal, without renal calculi, focal lesion, or hydronephrosis. Bladder is unremarkable. Stomach/Bowel: Stomach is within normal limits. Appendix appears normal. No evidence of bowel wall thickening, distention, or inflammatory changes. Multiple colon diverticula. Vascular/Lymphatic: No significant vascular findings are present. No enlarged abdominal or pelvic lymph nodes. Reproductive: Uterus is markedly enlarged and contains multiple masses, some of which are calcified, consistent with fibroids. Dominant mass is spleen slightly to  the right of midline and measures 8.6 by 9.1 cm on axial images and mildly hypodense but contains internal areas of increased density, possibly related to mild hemorrhagic degeneration. Endometrial stripe is obscured by multiple masses. No adnexal masses are visualized. Other: No free air or free fluid. Musculoskeletal: Bilateral SI joint sclerosis. No acute osseous abnormality. IMPRESSION: 1. Massively enlarged uterus containing multiple masses, consistent with history of fibroids. Dominant lesion is somewhat hypodense but contains internal areas of increased density, possibly related to hemorrhagic degeneration. 2. No evidence for acute appendicitis 3. Borderline enlarged spleen 4. Multiple gallstones. Mild intra hepatic biliary dilatation, suggest correlation with laboratory values. 5. 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 Electronically Signed   By: Donavan Foil M.D.   On: 12/30/2015 19:24   Dg Chest Port 1 View  Result Date: 12/30/2015 CLINICAL DATA:  Sepsis, 102(F)degree fever, Pt presents to MAU with complaints of lower abdominal discomfort. Reports chills started this morning with body aches. Nonsmoker, no priors EXAM: PORTABLE CHEST 1 VIEW COMPARISON:  None. FINDINGS: Heart size is accentuated by the AP sitting position of the patient. Lungs are free of focal consolidations and pleural effusions. No pulmonary edema. IMPRESSION: No evidence for acute cardiopulmonary abnormality. Electronically Signed   By: Nolon Nations M.D.   On: 12/30/2015 15:00     Assessment/Plan Principal Problem:   Sepsis ([place]) Active Problems:   Microcytic hypochromic anemia   Fibroid uterus    1. Sepsis - source not clear. I have discussed with Dr. Rip Harbour, on-call gynecologist about the patient's CT scan finding showing degenerative fibroids. Dr. Rip Harbour at this time feels patient's sepsis could not be from that. Follow blood cultures and at this time patient has been placed on vancomycin  and Zosyn for sepsis and Flagyl for Gardnerella Vaginalis.Since CT scan also shows gallstones I have ordered a HIDA scan to rule out cholecystitis. 2. Microcytic hypochromic anemia - probably secondary to menorrhagia and fibroids. Follow CBC and check anemia panel. Patient at this time is reluctant to get blood transfusion but may agree if there is any further drop in hemoglobin. 3. Fibroid uterus - Dr. Rip Harbour on call gynecologist with whom I discussed at [facility] health has advised to follow-up with them at discharge. 4. History of asthma presently not wheezing.   DVT prophylaxis: SCDs. Code Status: Full code.  Family Communication: Patient's mother.  Disposition Plan: Home.  Consults called: Discuss with gynecologist.  Admission status: Inpatient.    Rise Patience MD [facility] Pager [phone].  If 7PM-7AM, please contact night-coverage [email or web address] Password [facility]  12/30/2015, 10:59  PM

## 2015-12-30 NOTE — ED Notes (Signed)
All acknowledged orders were ordered and completed at [facility].

## 2015-12-31 ENCOUNTER — Inpatient Hospital Stay (HOSPITAL_COMMUNITY): Payer: Medicaid Other

## 2015-12-31 ENCOUNTER — Telehealth: Payer: Self-pay | Admitting: *Deleted

## 2015-12-31 LAB — URINE CULTURE: Culture: NO GROWTH

## 2015-12-31 LAB — CBC
HCT: 21.9 % — ABNORMAL LOW (ref 36.0–46.0)
Hemoglobin: 5.9 g/dL — CL (ref 12.0–15.0)
MCH: 19 pg — ABNORMAL LOW (ref 26.0–34.0)
MCHC: 26.9 g/dL — ABNORMAL LOW (ref 30.0–36.0)
MCV: 70.6 fL — ABNORMAL LOW (ref 78.0–100.0)
Platelets: 319 10*3/uL (ref 150–400)
RBC: 3.1 MIL/uL — ABNORMAL LOW (ref 3.87–5.11)
RDW: 18.8 % — ABNORMAL HIGH (ref 11.5–15.5)
WBC: 10.8 10*3/uL — ABNORMAL HIGH (ref 4.0–10.5)

## 2015-12-31 LAB — VITAMIN B12: Vitamin B-12: 396 pg/mL (ref 180–914)

## 2015-12-31 LAB — RETICULOCYTES
RBC.: 3.07 MIL/uL — ABNORMAL LOW (ref 3.87–5.11)
Retic Count, Absolute: 55.3 10*3/uL (ref 19.0–186.0)
Retic Ct Pct: 1.8 % (ref 0.4–3.1)

## 2015-12-31 LAB — IRON AND TIBC
Iron: 6 ug/dL — ABNORMAL LOW (ref 28–170)
Saturation Ratios: 2 % — ABNORMAL LOW (ref 10.4–31.8)
TIBC: 291 ug/dL (ref 250–450)
UIBC: 285 ug/dL

## 2015-12-31 LAB — COMPREHENSIVE METABOLIC PANEL
ALT: 10 U/L — ABNORMAL LOW (ref 14–54)
AST: 24 U/L (ref 15–41)
Albumin: 3.1 g/dL — ABNORMAL LOW (ref 3.5–5.0)
Alkaline Phosphatase: 30 U/L — ABNORMAL LOW (ref 38–126)
Anion gap: 4 — ABNORMAL LOW (ref 5–15)
BUN: 9 mg/dL (ref 6–20)
CO2: 24 mmol/L (ref 22–32)
Calcium: 7.7 mg/dL — ABNORMAL LOW (ref 8.9–10.3)
Chloride: 111 mmol/L (ref 101–111)
Creatinine, Ser: 0.54 mg/dL (ref 0.44–1.00)
GFR calc Af Amer: >60 mL/min (ref >60)
GFR calc non Af Amer: >60 mL/min (ref >60)
Glucose, Bld: 103 mg/dL — ABNORMAL HIGH (ref 65–99)
Potassium: 3.4 mmol/L — ABNORMAL LOW (ref 3.5–5.1)
Sodium: 139 mmol/L (ref 135–145)
Total Bilirubin: 1.5 mg/dL — ABNORMAL HIGH (ref 0.3–1.2)
Total Protein: 5.7 g/dL — ABNORMAL LOW (ref 6.5–8.1)

## 2015-12-31 LAB — PREPARE RBC (CROSSMATCH)

## 2015-12-31 LAB — FOLATE: Folate: 18.8 ng/mL (ref >5.9)

## 2015-12-31 LAB — HIV ANTIBODY (ROUTINE TESTING W REFLEX): HIV Screen 4th Generation wRfx: NONREACTIVE

## 2015-12-31 LAB — ABO/RH: ABO/RH(D): A POS

## 2015-12-31 LAB — FERRITIN: Ferritin: 16 ng/mL (ref 11–307)

## 2015-12-31 LAB — LACTIC ACID, PLASMA: Lactic Acid, Venous: 0.9 mmol/L (ref 0.5–1.9)

## 2015-12-31 MED ORDER — TECHNETIUM TC 99M MEBROFENIN IV KIT
6.9000 | PACK | Freq: Once | INTRAVENOUS | Status: AC | PRN
  Administered 2015-12-31: 6.9 via INTRAVENOUS

## 2015-12-31 MED ORDER — ALBUTEROL SULFATE (2.5 MG/3ML) 0.083% IN NEBU: 2.5000 mg | INHALATION_SOLUTION | Freq: Four times a day (QID) | RESPIRATORY_TRACT | Status: DC | PRN

## 2015-12-31 MED ORDER — SODIUM CHLORIDE 0.9 % IV SOLN: 10.0000 mL/h | Freq: Once | INTRAVENOUS | Status: DC

## 2015-12-31 MED ORDER — MORPHINE SULFATE (PF) 4 MG/ML IV SOLN
INTRAVENOUS | Status: AC
  Administered 2015-12-31: 3.3 mg via INTRAVENOUS
  Filled 2015-12-31: qty 1

## 2015-12-31 MED ORDER — MORPHINE SULFATE (PF) 4 MG/ML IV SOLN
3.3000 mg | Freq: Once | INTRAVENOUS | Status: AC
  Administered 2015-12-31: 3.3 mg via INTRAVENOUS

## 2015-12-31 NOTE — ED Notes (Signed)
Pt to HIDA scan.

## 2015-12-31 NOTE — Telephone Encounter (Signed)
Message forwarded to front staff to call patient to offer appointment with MD

## 2015-12-31 NOTE — ED Notes (Signed)
Report given [facility] Signs. Pt to be transferred 4E from HIDA scan by radiology.

## 2015-12-31 NOTE — Progress Notes (Signed)
[name] [id number] DOB: 1975/08/15 DOA: 12/30/2015 PCP: Frederico Hamman, MD (Inactive)  Brief narrative:  40 y/o Multigravid ? Known fibroids htn Asthma  [facility] 6, Meeting SIRS criterion  Past medical history-As per Problem list Chart reviewed as below-   Consultants:  GYN  Procedures:  none  Antibiotics:  vanc  Flagyll   Subjective    Well has had some spotting Usual periods are not heavy  Last 2 weeks has had bleeding +/- Not sexually active presently No dysuria, no vag d/c No cough cold   Objective      Objective: Vitals:   12/31/15 0900 12/31/15 1004 12/31/15 1300 12/31/15 1321  BP: 109/63 114/64 127/70   Pulse: 88 90 96   Resp: 17 16 18    Temp:  98.5 F (36.9 C) 98.3 F (36.8 C)   TempSrc:  Oral Other (Comment)   SpO2: 100% 100% 100%   Weight:    83.1 kg (183 lb 4.8 oz)  Height:    5\' 4"  (1.626 m)    Intake/Output Summary (Last 24 hours) at 12/31/15 1441 Last data filed at 12/31/15 1007  Gross per 24 hour  Intake          3659.12 ml  Output             1000 ml  Net          2659.12 ml    Exam:  General: eomi ncat Cardiovascular: s1 s 2no m/r/g Respiratory: clear no added sound   Abdomen: uterus 20 wk size, non tender abd Skin no LE edmea  Data Reviewed: Basic Metabolic Panel:  Recent Labs Lab 12/30/15 1412 12/31/15 0450  NA 135 139  K 3.3* 3.4*  CL 102 111  CO2 23 24  GLUCOSE 148* 103*  BUN 10 9  CREATININE 0.68 0.54  CALCIUM 8.8* 7.7*   Liver Function Tests:  Recent Labs Lab 12/30/15 1412 12/31/15 0450  AST 20 24  ALT 12* 10*  ALKPHOS 35* 30*  BILITOT 1.0 1.5*  PROT 6.7 5.7*  ALBUMIN 3.5 3.1*    Recent Labs Lab 12/30/15 1800  LIPASE 20   No results for input(s): AMMONIA in the last 168 hours. CBC:  Recent Labs Lab 12/30/15 1412 12/30/15 2305 12/31/15 0450  WBC 11.9* 11.7* 10.8*  NEUTROABS 10.9* 9.8*  --   HGB 6.8* 6.1* 5.9*  HCT 24.7* 22.5* 21.9*    MCV 70.0* 68.8* 70.6*  PLT 413* 340 319   Cardiac Enzymes: No results for input(s): CKTOTAL, CKMB, CKMBINDEX, TROPONINI in the last 168 hours. BNP: Invalid input(s): POCBNP CBG: No results for input(s): GLUCAP in the last 168 hours.  Recent Results (from the past 240 hour(s))  Urine culture     Status: None   Collection Time: 12/30/15 12:58 PM  Result Value Ref Range Status   Specimen Description URINE, CLEAN CATCH  Final   Special Requests NONE  Final   Culture NO GROWTH Performed at [facility]   Final   Report Status 12/31/2015 FINAL  Final  Wet prep, genital     Status: Abnormal   Collection Time: 12/30/15  2:02 PM  Result Value Ref Range Status   Yeast Wet Prep HPF POC NONE SEEN NONE SEEN Final   Trich, Wet Prep NONE SEEN NONE SEEN Final   Clue Cells Wet Prep HPF POC PRESENT (A) NONE SEEN Final   WBC, Wet Prep HPF POC FEW (A) NONE SEEN Final  Comment: MANY BACTERIA SEEN   Sperm NONE SEEN  Final  Culture, blood (x 2)     Status: None (Preliminary result)   Collection Time: 12/30/15  2:12 PM  Result Value Ref Range Status   Specimen Description BLOOD RIGHT ANTECUBITAL  Final   Special Requests BOTTLES DRAWN AEROBIC AND ANAEROBIC 5CC  Final   Culture   Final    NO GROWTH < 24 HOURS Performed at [facility]    Report Status PENDING  Incomplete  Culture, blood (x 2)     Status: None (Preliminary result)   Collection Time: 12/30/15  2:12 PM  Result Value Ref Range Status   Specimen Description BLOOD LEFT ANTECUBITAL  Final   Special Requests BOTTLES DRAWN AEROBIC AND ANAEROBIC 5CC  Final   Culture   Final    NO GROWTH < 24 HOURS Performed at [facility]    Report Status PENDING  Incomplete     Studies:              All Imaging reviewed and is as per above notation   Scheduled Meds: . sodium chloride  10 mL/hr Intravenous Once  . ferrous sulfate  325 mg Oral BID  . metroNIDAZOLE  500 mg Oral Q8H  . piperacillin-tazobactam (ZOSYN)  IV   3.375 g Intravenous Q8H  . vancomycin  1,000 mg Intravenous Q8H   Continuous Infusions: . sodium chloride 125 mL/hr at 12/31/15 0604     Assessment/Plan:   1. Sepsis - source not clear.Dr. Raliegh Ip discussed with Dr. Rip Harbour, on-call gynecologist about the patient's CT scan finding showing degenerative fibroids.Follow blood cultures and at this time patient has been placed on vancomycin and flagyll for sepsis--> Gardnerela sepsis unlikely as is a commnesal and causes vaginits HIDA scan to rules out cholecystitis.  If no source of afebrile would d/c all abx 11/28--could have been trapped blood causing transient fever 2. Microcytic hypochromic anemia - probably secondary to menorrhagia and fibroids. Follow CBC and check anemia panel. Needs 2 U PRBC-labs am 3. Fibroid uterus - Dr. Rip Harbour on call gynecologist with whom I discussed at [facility] health has advised to follow-up with them at discharge. 4. History of asthma presently not wheezing.     Verneita Griffes, MD  [facility] Pager [phone] 12/31/2015, 2:41 PM    LOS: 1 day

## 2015-12-31 NOTE — ED Notes (Signed)
Pt not in room and still at scan.

## 2015-12-31 NOTE — Telephone Encounter (Signed)
-----   Message from Seabron Spates, CNM sent at 12/30/2015  4:21 PM EST ----- Regarding: Needs GYN followup  VERY large fibroids LOW hemoglobin with abnormal bleeding  Needs Endometrial biopsy and evaluation of fibroids Was a [facility] patient  See if we can get her an MD appointment for anywhere from  2-5 weeks from now  Thanks marie

## 2015-12-31 NOTE — ED Notes (Signed)
Nuclear Medicine states the pt will be another hour and will be able to transport pt up to floor after the scan.  Floor called and Gregary Signs, RN will be taking report.

## 2016-01-01 DIAGNOSIS — D259 Leiomyoma of uterus, unspecified: Secondary | ICD-10-CM

## 2016-01-01 DIAGNOSIS — A419 Sepsis, unspecified organism: Principal | ICD-10-CM

## 2016-01-01 DIAGNOSIS — D509 Iron deficiency anemia, unspecified: Secondary | ICD-10-CM

## 2016-01-01 DIAGNOSIS — A599 Trichomoniasis, unspecified: Secondary | ICD-10-CM

## 2016-01-01 LAB — TYPE AND SCREEN
ABO/RH(D): A POS
Antibody Screen: NEGATIVE
Unit division: 0
Unit division: 0

## 2016-01-01 LAB — CBC
HCT: 28.1 % — ABNORMAL LOW (ref 36.0–46.0)
Hemoglobin: 8.3 g/dL — ABNORMAL LOW (ref 12.0–15.0)
MCH: 21.5 pg — ABNORMAL LOW (ref 26.0–34.0)
MCHC: 29.5 g/dL — ABNORMAL LOW (ref 30.0–36.0)
MCV: 72.8 fL — ABNORMAL LOW (ref 78.0–100.0)
Platelets: 279 10*3/uL (ref 150–400)
RBC: 3.86 MIL/uL — ABNORMAL LOW (ref 3.87–5.11)
RDW: 19.4 % — ABNORMAL HIGH (ref 11.5–15.5)
WBC: 8.1 10*3/uL (ref 4.0–10.5)

## 2016-01-01 LAB — GC/CHLAMYDIA PROBE AMP (~~LOC~~) NOT AT ARMC
Chlamydia: NEGATIVE
Neisseria Gonorrhea: NEGATIVE

## 2016-01-01 LAB — PROCALCITONIN: Procalcitonin: 12.02 ng/mL

## 2016-01-01 MED ORDER — METRONIDAZOLE 500 MG PO TABS
2000.0000 mg | ORAL_TABLET | Freq: Once | ORAL | Status: AC
  Administered 2016-01-01: 2000 mg via ORAL
  Filled 2016-01-01: qty 4

## 2016-01-01 NOTE — Discharge Summary (Signed)
Discharge Summary  [name] [id number] DOB: 09-Jul-1975  PCP: Patient has no PCP. Referred to OB/GYN for follow-up Admit date: 12/30/2015 Discharge date: 01/01/2016  Time spent: Greater than 30 minutes   Recommendations for Outpatient Follow-up:  1. Patient will follow-up with OB/GYN. Her previous GYN doctor has since retired. She is given a referral for new GYN   Discharge Diagnoses:  Active Hospital Problems   Diagnosis Date Noted  . Sepsis ([place]) 12/30/2015  . Fibroid uterus 12/30/2015  . Microcytic hypochromic anemia 05/27/2015  Trichomoniasis  Resolved Hospital Problems   Diagnosis Date Noted Date Resolved  No resolved problems to display.    Discharge Condition: Improved, being discharged home   Diet recommendation: Regular diet   Vitals:   01/01/16 0507 01/01/16 1344  BP: 131/71 (!) 122/57  Pulse: 91 74  Resp: 16 18  Temp: 97.7 F (36.5 C) 98.8 F (37.1 C)    History of present illness:  40 year old female past mental history of obesity and asthma presented to [facility] emergency room on 11/27 with complaints of abdominal pain found to be febrile with temperatures of 103. CT of abdomen and pelvis noted large fibroids showing degeneration. Pro calcitonin elevated, lactic acid level normal. Chest x-ray and urinalysis unremarkable. Patient had a wet prep net inclusive cells. Her hemoglobin is also noted to be around 6 with an MCV in the 70s.. Patient denied any NSAIDs or goody powder use or bleeding per rectum. He did state that she been having menorrhagia. Influenza PCR negative, blood cultures sent and patient placed on empiric and about accident given a fluid bolus.  Hospital Course:  Principal Problem:   SIRS from unknown source: patient started on empiric antibodies, but no source noted. Blood cultures unrevealing. Patient has since been afebrile. Suspect cause may be secondary to  trapped menstrual blood causing transient fever. Pro calcitonin  levels then and an abdominal source would be unreliable. Lactic acid level normal. hida scan ruled out cholecystitis.  Active Problems:   Microcytic hypochromic anemia secondary uterine fibroids: Recurrent problem. Referred patient to OB/GYN. No signs of GI bleed. This is all secondary to bleeding fibroids. Continue on iron. Patient transfused 2 units packed red blood cells   Fibroid uterus: As above. Referred to OB/GYN as outpatient  Trichomonas: Treated with Flagyl 2 g prior to discharge   Procedures:  None   Consultations:  Case discussed with OB/GYN   Discharge Exam: BP (!) 122/57 (BP Location: Left Arm)   Pulse 74   Temp 98.8 F (37.1 C) (Oral)   Resp 18   Ht 5\' 4"  (1.626 m)   Wt 83.1 kg (183 lb 4.8 oz)   LMP 10/30/2015   SpO2 100%   BMI 31.46 kg/m   General: Alert and oriented 3, no acute distress   Cardiovascular: Regular rate and rhythm, S1-S2   Respiratory: Clear to auscultation bilaterally   Discharge Instructions You were cared for by a hospitalist during your hospital stay. If you have any questions about your discharge medications or the care you received while you were in the hospital after you are discharged, you can call the unit and asked to speak with the hospitalist on call if the hospitalist that took care of you is not available. Once you are discharged, your primary care physician will handle any further medical issues. Please note that NO REFILLS for any discharge medications will be authorized once you are discharged, as it is imperative that you return to your primary care  physician (or establish a relationship with a primary care physician if you do not have one) for your aftercare needs so that they can reassess your need for medications and monitor your lab values.  Discharge Instructions    Diet - low sodium heart healthy    Complete by:  As directed    Increase activity slowly    Complete by:  As directed        Medication List    STOP  taking these medications   amoxicillin 500 MG capsule Commonly known as:  AMOXIL     TAKE these medications   albuterol 108 (90 Base) MCG/ACT inhaler Commonly known as:  PROVENTIL HFA;VENTOLIN HFA Inhale 2 puffs into the lungs every 6 (six) hours as needed for wheezing or shortness of breath.   ferrous sulfate 325 (65 FE) MG EC tablet Take 1 tablet (325 mg total) by mouth 2 (two) times daily.   HYDROcodone-acetaminophen 5-325 MG tablet Commonly known as:  NORCO/VICODIN Take 1 tablet by mouth every 6 (six) hours as needed for moderate pain.      Allergies  Allergen Reactions  . Shellfish Allergy Itching and Swelling    Throat swells   . Other Itching and Swelling    Hazelnut   Follow-up Information    [facility]. Schedule an appointment as soon as possible for a visit in 2 week(s).   Specialty:  Obstetrics and Gynecology Contact information: [address] [id number] [place] [phone]           The results of significant diagnostics from this hospitalization (including imaging, microbiology, ancillary and laboratory) are listed below for reference.    Significant Diagnostic Studies: Nm Hepatobiliary Liver Func  Result Date: 12/31/2015 CLINICAL DATA:  Sepsis.  Gallstones. EXAM: NUCLEAR MEDICINE HEPATOBILIARY IMAGING TECHNIQUE: Sequential images of the abdomen were obtained out to 60 minutes following intravenous administration of radiopharmaceutical. RADIOPHARMACEUTICALS:  5.0 mCi Tc-58m Choletec IV, this 5 x 1 0.9 millicuries technetium 31 M following administration of 3.3 mg of morphine. COMPARISON:  CT 12/30/2015. FINDINGS: Liver, biliary system, bowel visualize rapidly. The gallbladder does not visualize 50 minutes. Morphine was administered and the patient re- injected with radiopharmaceutical. The gallbladder then visualized after 25 minutes. IMPRESSION: Gallbladder visualizes following morphine administration. No  evidence of cystic duct obstruction/ acute cholecystitis. Electronically Signed   By: Marcello Moores  Register   On: 12/31/2015 13:27   Ct Abdomen Pelvis W Contrast  Result Date: 12/30/2015 CLINICAL DATA:  Lower abdominal pain irregular bleeding EXAM: CT ABDOMEN AND PELVIS WITH CONTRAST TECHNIQUE: Multidetector CT imaging of the abdomen and pelvis was performed using the standard protocol following bolus administration of intravenous contrast. CONTRAST:  150mL ISOVUE-300 IOPAMIDOL (ISOVUE-300) INJECTION 61% COMPARISON:  Ultrasound 05/23/2015 FINDINGS: Lower chest: No acute consolidation or pleural effusion at the lung base. Heart size normal. Hepatobiliary: Multiple calcified stones within the gallbladder. No wall thickening. Minimal intra hepatic biliary dilatation. Small focal hypodensity within the central right hepatic lobe could relate to a mildly dilated duct or a sub cm nonspecific hypodense lesion. Extrahepatic common bowel duct does not appear enlarged. Pancreas: Unremarkable. No pancreatic ductal dilatation or surrounding inflammatory changes. Spleen: Upper normal in size, measuring 12 cm. No focal abnormality. Adrenals/Urinary Tract: Adrenal glands are unremarkable. Kidneys are normal, without renal calculi, focal lesion, or hydronephrosis. Bladder is unremarkable. Stomach/Bowel: Stomach is within normal limits. Appendix appears normal. No evidence of bowel wall thickening, distention, or inflammatory changes. Multiple colon diverticula. Vascular/Lymphatic: No significant  vascular findings are present. No enlarged abdominal or pelvic lymph nodes. Reproductive: Uterus is markedly enlarged and contains multiple masses, some of which are calcified, consistent with fibroids. Dominant mass is spleen slightly to the right of midline and measures 8.6 by 9.1 cm on axial images and mildly hypodense but contains internal areas of increased density, possibly related to mild hemorrhagic degeneration. Endometrial stripe  is obscured by multiple masses. No adnexal masses are visualized. Other: No free air or free fluid. Musculoskeletal: Bilateral SI joint sclerosis. No acute osseous abnormality. IMPRESSION: 1. Massively enlarged uterus containing multiple masses, consistent with history of fibroids. Dominant lesion is somewhat hypodense but contains internal areas of increased density, possibly related to hemorrhagic degeneration. 2. No evidence for acute appendicitis 3. Borderline enlarged spleen 4. Multiple gallstones. Mild intra hepatic biliary dilatation, suggest correlation with laboratory values. 5. 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 Electronically Signed   By: Donavan Foil M.D.   On: 12/30/2015 19:24   Dg Chest Port 1 View  Result Date: 12/30/2015 CLINICAL DATA:  Sepsis and gallstones. Chills, fever, and body aches today. EXAM: PORTABLE CHEST 1 VIEW COMPARISON:  12/30/2015 FINDINGS: Shallow inspiration. Normal heart size and pulmonary vascularity. No focal airspace disease or consolidation in the lungs. No blunting of costophrenic angles. No pneumothorax. Mediastinal contours appear intact. IMPRESSION: No active disease. Electronically Signed   By: Lucienne Capers M.D.   On: 12/30/2015 23:38   Dg Chest Port 1 View  Result Date: 12/30/2015 CLINICAL DATA:  Sepsis, 102(F)degree fever, Pt presents to [facility] with complaints of lower abdominal discomfort. Reports chills started this morning with body aches. Nonsmoker, no priors EXAM: PORTABLE CHEST 1 VIEW COMPARISON:  None. FINDINGS: Heart size is accentuated by the AP sitting position of the patient. Lungs are free of focal consolidations and pleural effusions. No pulmonary edema. IMPRESSION: No evidence for acute cardiopulmonary abnormality. Electronically Signed   By: Nolon Nations M.D.   On: 12/30/2015 15:00    Microbiology: Recent Results (from the past 240 hour(s))  Urine culture     Status: None   Collection Time: 12/30/15 12:58  PM  Result Value Ref Range Status   Specimen Description URINE, CLEAN CATCH  Final   Special Requests NONE  Final   Culture NO GROWTH Performed at [facility]   Final   Report Status 12/31/2015 FINAL  Final  Wet prep, genital     Status: Abnormal   Collection Time: 12/30/15  2:02 PM  Result Value Ref Range Status   Yeast Wet Prep HPF POC NONE SEEN NONE SEEN Final   Trich, Wet Prep NONE SEEN NONE SEEN Final   Clue Cells Wet Prep HPF POC PRESENT (A) NONE SEEN Final   WBC, Wet Prep HPF POC FEW (A) NONE SEEN Final    Comment: MANY BACTERIA SEEN   Sperm NONE SEEN  Final  Culture, blood (x 2)     Status: None (Preliminary result)   Collection Time: 12/30/15  2:12 PM  Result Value Ref Range Status   Specimen Description BLOOD RIGHT ANTECUBITAL  Final   Special Requests BOTTLES DRAWN AEROBIC AND ANAEROBIC 5CC  Final   Culture   Final    NO GROWTH 2 DAYS Performed at [facility]    Report Status PENDING  Incomplete  Culture, blood (x 2)     Status: None (Preliminary result)   Collection Time: 12/30/15  2:12 PM  Result Value Ref Range Status   Specimen Description  BLOOD LEFT ANTECUBITAL  Final   Special Requests BOTTLES DRAWN AEROBIC AND ANAEROBIC 5CC  Final   Culture   Final    NO GROWTH 2 DAYS Performed at [facility]    Report Status PENDING  Incomplete     Labs: Basic Metabolic Panel:  Recent Labs Lab 12/30/15 1412 12/31/15 0450  NA 135 139  K 3.3* 3.4*  CL 102 111  CO2 23 24  GLUCOSE 148* 103*  BUN 10 9  CREATININE 0.68 0.54  CALCIUM 8.8* 7.7*   Liver Function Tests:  Recent Labs Lab 12/30/15 1412 12/31/15 0450  AST 20 24  ALT 12* 10*  ALKPHOS 35* 30*  BILITOT 1.0 1.5*  PROT 6.7 5.7*  ALBUMIN 3.5 3.1*    Recent Labs Lab 12/30/15 1800  LIPASE 20   No results for input(s): AMMONIA in the last 168 hours. CBC:  Recent Labs Lab 12/30/15 1412 12/30/15 2305 12/31/15 0450 01/01/16 0544  WBC 11.9* 11.7* 10.8* 8.1  NEUTROABS  10.9* 9.8*  --   --   HGB 6.8* 6.1* 5.9* 8.3*  HCT 24.7* 22.5* 21.9* 28.1*  MCV 70.0* 68.8* 70.6* 72.8*  PLT 413* 340 319 279   Cardiac Enzymes: No results for input(s): CKTOTAL, CKMB, CKMBINDEX, TROPONINI in the last 168 hours. BNP: BNP (last 3 results) No results for input(s): BNP in the last 8760 hours.  ProBNP (last 3 results) No results for input(s): PROBNP in the last 8760 hours.  CBG: No results for input(s): GLUCAP in the last 168 hours.     Signed:  Annita Brod, MD [facility] 01/01/2016, 3:27 PM

## 2016-01-01 NOTE — Discharge Instructions (Signed)
Trichomoniasis °Trichomoniasis is an infection caused by an organism called Trichomonas. The infection can affect both women and men. In women, the outer female genitalia and the vagina are affected. In men, the penis is mainly affected, but the prostate and other reproductive organs can also be involved. Trichomoniasis is a sexually transmitted infection (STI) and is most often passed to another person through sexual contact.  °RISK FACTORS °Having unprotected sexual intercourse. °Having sexual intercourse with an infected partner. °SIGNS AND SYMPTOMS  °Symptoms of trichomoniasis in women include: °Abnormal gray-green frothy vaginal discharge. °Itching and irritation of the vagina. °Itching and irritation of the area outside the vagina. °Symptoms of trichomoniasis in men include:  °Penile discharge with or without pain. °Pain during urination. This results from inflammation of the urethra. °DIAGNOSIS  °Trichomoniasis may be found during a Pap test or physical exam. Your health care provider may use one of the following methods to help diagnose this infection: °Testing the pH of the vagina with a test tape. °Using a vaginal swab test that checks for the Trichomonas organism. A test is available that provides results within a few minutes. °Examining a urine sample. °Testing vaginal secretions. °Your health care provider may test you for other STIs, including HIV. °TREATMENT  °You may be given medicine to fight the infection. Women should inform their health care provider if they could be or are pregnant. Some medicines used to treat the infection should not be taken during pregnancy. °Your health care provider may recommend over-the-counter medicines or creams to decrease itching or irritation. °Your sexual partner will need to be treated if infected. °Your health care provider may test you for infection again 3 months after treatment. °HOME CARE INSTRUCTIONS  °Take medicines only as directed by your health care  provider. °Take over-the-counter medicine for itching or irritation as directed by your health care provider. °Do not have sexual intercourse while you have the infection. °Women should not douche or wear tampons while they have the infection. °Discuss your infection with your partner. Your partner may have gotten the infection from you, or you may have gotten it from your partner. °Have your sex partner get examined and treated if necessary. °Practice safe, informed, and protected sex. °See your health care provider for other STI testing. °SEEK MEDICAL CARE IF:  °You still have symptoms after you finish your medicine. °You develop abdominal pain. °You have pain when you urinate. °You have bleeding after sexual intercourse. °You develop a rash. °Your medicine makes you sick or makes you throw up (vomit). °MAKE SURE YOU: °Understand these instructions. °Will watch your condition. °Will get help right away if you are not doing well or get worse. °This information is not intended to replace advice given to you by your health care provider. Make sure you discuss any questions you have with your health care provider. °Document Released: 07/15/2000 Document Revised: 02/09/2014 Document Reviewed: 10/31/2012 °Elsevier Interactive Patient Education © 2017 [employer] °  °

## 2016-01-01 NOTE — Care Management Note (Signed)
Case Management Note  Patient Details  Name: [name] MRN: [id number] Date of Birth: 1975-02-24  Subjective/Objective:  40 y/o f admitted w/Sepsis. Hx: uterine fibroids. From home.                  Action/Plan:d/c plan home.   Expected Discharge Date:   (unknown)               Expected Discharge Plan:  Home/Self Care  In-House Referral:     Discharge planning Services  CM Consult  Post Acute Care Choice:    Choice offered to:     DME Arranged:    DME Agency:     HH Arranged:    [place] Agency:     Status of Service:  Completed, signed off  If discussed at [employer] of Stay Meetings, dates discussed:    Additional Comments:  Dessa Phi, RN 01/01/2016, 2:10 PM

## 2016-01-04 LAB — CULTURE, BLOOD (ROUTINE X 2)
Culture: NO GROWTH
Culture: NO GROWTH

## 2016-01-08 ENCOUNTER — Telehealth: Payer: Self-pay

## 2016-01-08 DIAGNOSIS — D649 Anemia, unspecified: Secondary | ICD-10-CM

## 2016-01-08 MED ORDER — FERROUS SULFATE 325 (65 FE) MG PO TBEC: 325.0000 mg | DELAYED_RELEASE_TABLET | Freq: Two times a day (BID) | ORAL | 3 refills | Status: DC

## 2016-01-08 NOTE — Telephone Encounter (Signed)
Returned call to pharmacy, and advised that rx would be e-prescribed, per proivder.

## 2016-01-20 ENCOUNTER — Encounter: Payer: Self-pay | Admitting: Obstetrics & Gynecology

## 2016-01-20 ENCOUNTER — Ambulatory Visit (INDEPENDENT_AMBULATORY_CARE_PROVIDER_SITE_OTHER): Payer: Medicaid Other | Admitting: Obstetrics & Gynecology

## 2016-01-20 VITALS — BP 126/77 | HR 67 | Wt 177.4 lb

## 2016-01-20 DIAGNOSIS — D509 Iron deficiency anemia, unspecified: Secondary | ICD-10-CM | POA: Diagnosis not present

## 2016-01-20 DIAGNOSIS — Z3202 Encounter for pregnancy test, result negative: Secondary | ICD-10-CM

## 2016-01-20 DIAGNOSIS — D251 Intramural leiomyoma of uterus: Secondary | ICD-10-CM | POA: Diagnosis present

## 2016-01-20 LAB — POCT PREGNANCY, URINE: Preg Test, Ur: NEGATIVE

## 2016-01-20 MED ORDER — PRENATAL VITAMINS 0.8 MG PO TABS: 1.0000 | ORAL_TABLET | Freq: Every day | ORAL | 12 refills | Status: DC

## 2016-01-20 MED ORDER — MEGESTROL ACETATE 40 MG PO TABS: 40.0000 mg | ORAL_TABLET | Freq: Two times a day (BID) | ORAL | 3 refills | Status: DC

## 2016-01-20 NOTE — Progress Notes (Signed)
Patient ID: [name], female   DOB: October 25, 1975, 40 y.o.   MRN: [id number]  Chief Complaint  Patient presents with  . Fibroids    HPI [name] is a 40 y.o. female.  [id number] Patient's last menstrual period was 01/14/2016. She was a patient of Dr Ruthann Cancer and was scheduled for myomectomy last year but was cancelled due to anemia. She still has abdominal pain and heavy menses and returns to discuss management. She states she was not offered hysterectomy by her doctor. She uses condoms for [facility]. HPI  Past Medical History:  Diagnosis Date  . Anemia   . Asthma   . Gestational diabetes   . Uterine fibroid     Past Surgical History:  Procedure Laterality Date  . arm surgery    . CESAREAN SECTION    . DILATION AND CURETTAGE OF UTERUS    . TOOTH EXTRACTION      Family History  Problem Relation Age of Onset  . Hypertension Mother   . Hypertension Brother   . Hypertension Maternal Uncle     Social History Social History  Substance Use Topics  . Smoking status: Never Smoker  . Smokeless tobacco: Never Used  . Alcohol use No    Allergies  Allergen Reactions  . Shellfish Allergy Itching and Swelling    Throat swells   . Other Itching and Swelling    Hazelnut    Current Outpatient Prescriptions  Medication Sig Dispense Refill  . albuterol (PROVENTIL HFA;VENTOLIN HFA) 108 (90 Base) MCG/ACT inhaler Inhale 2 puffs into the lungs every 6 (six) hours as needed for wheezing or shortness of breath.    . ferrous sulfate 325 (65 FE) MG EC tablet Take 1 tablet (325 mg total) by mouth 2 (two) times daily.  3  . megestrol (MEGACE) 40 MG tablet Take 1 tablet (40 mg total) by mouth 2 (two) times daily. 60 tablet 3  . Prenatal Multivit-Min-Fe-FA (PRENATAL VITAMINS) 0.8 MG tablet Take 1 tablet by mouth daily. 30 tablet 12   No current facility-administered medications for this visit.     Review of Systems Review of Systems  Gastrointestinal: Positive for abdominal distention  and abdominal pain.  Genitourinary: Positive for menstrual problem, pelvic pain and vaginal bleeding.    Blood pressure 126/77, pulse 67, weight 177 lb 6.4 oz (80.5 kg), last menstrual period 01/14/2016.  Physical Exam Physical Exam  Constitutional: She appears well-developed. No distress.  Became tearful during discussion of treatment options and her plans regarding fertility    Data Reviewed CLINICAL DATA:  Uterine fibroids, history of C-section, status post D and Celsius  EXAM: TRANSABDOMINAL AND TRANSVAGINAL ULTRASOUND OF PELVIS  TECHNIQUE: Both transabdominal and transvaginal ultrasound examinations of the pelvis were performed. Transabdominal technique was performed for global imaging of the pelvis including uterus, ovaries, adnexal regions, and pelvic cul-de-sac. It was necessary to proceed with endovaginal exam following the transabdominal exam to attempt to visualize the endometrium.  COMPARISON:  None  FINDINGS: Uterus  Measurements: 17.3 x 11.1 x 15.3 cm. Numerous uterine fibroids. Dominant fibroids include:  --8.1 x 7.5 x 7.4 cm submucosal/intracavitary fibroid in the uterine body, previously 8.5 cm x 7.8 x 6.5 cm  --2.8 x 2.8 x 2.5 cm subserosal calcified uterine fibroid in the anterior uterine fundus  --4.7 x 4.5 x 4.1 cm partially calcified/degenerating subserosal fibroid in the right uterine fundus  --4.4 x 3.5 x 3.6 cm calcified subserosal fibroid in the right posterior uterine fundus  --6.2 x  5.7 x 5.4 cm subserosal fibroid in the anterior left uterine body  Endometrium  Not discretely visualized/distorted due to multiple uterine fibroids.  Right ovary  Measurements: 3.5 x 2.7 x 2.7 cm. Normal appearance/no adnexal mass.  Left ovary  Measurements: 4.4 x 2.9 x 3.4 cm. 2.3 cm simple cyst/follicle, physiologic.  Other findings  No abnormal free fluid.  IMPRESSION: Numerous uterine fibroids, including a dominant 8.1  cm submucosal/intracavitary fibroid in the uterine body, grossly unchanged.   Electronically Signed   By: Julian Hy M.D.   On: 05/23/2015 09:40  Assessment   sx fibroid uterus  H/o anemia and transfusion  Unsure about her plans for future fertility  Plan    Information on fibroid, myomectomy and hysterectomy was given and she will return to discuss options for therapy. I offered Lupron but will defer for now. EMbx deferred.  Megace 40 mg ordered       Emeterio Reeve 01/20/2016, 4:37 PM

## 2016-01-20 NOTE — Patient Instructions (Addendum)
Abdominal Hysterectomy Abdominal hysterectomy is a surgical procedure to remove your womb (uterus). Your uterus is the muscular organ that contains a developing baby. This surgery is done for many reasons. You may need an abdominal hysterectomy if you have cancer, growths (tumors), long-term pain, or bleeding. You may also have this procedure if your uterus has slipped down into your vagina (uterine prolapse). Depending on why you need an abdominal hysterectomy, you may also have other reproductive organs removed. These could include the part of your vagina that connects with your uterus (cervix), the organs that make eggs (ovaries), and the tubes that connect the ovaries to the uterus (fallopian tubes). Tell a health care provider about:  Any allergies you have.  All medicines you are taking, including vitamins, herbs, eye drops, creams, and over-the-counter medicines.  Any problems you or family members have had with anesthetic medicines.  Any blood disorders you have.  Any surgeries you have had.  Any medical conditions you have. What are the risks? Generally, this is a safe procedure. However, as with any procedure, problems can occur. Infection is the most common problem after an abdominal hysterectomy. Other possible problems include:  Bleeding.  Formation of blood clots that may break free and travel to your lungs.  Injury to other organs near your uterus.  Nerve injury causing nerve pain.  Decreased interest in sex or pain during sexual intercourse. What happens before the procedure?  Abdominal hysterectomy is a major surgical procedure. It can affect the way you feel about yourself. Talk to your health care provider about the physical and emotional changes hysterectomy may cause.  You may need to have blood work and X-rays done before surgery.  Quit smoking if you smoke. Ask your health care provider for help if you are struggling to quit.  Stop taking medicines that  thin your blood as directed by your health care provider.  You may be instructed to take antibiotic medicines or laxatives before surgery.  Do not eat or drink anything for 6-8 hours before surgery.  Take your regular medicines with a small sip of water.  Bathe or shower the night or morning before surgery. What happens during the procedure?  Abdominal hysterectomy is done in the operating room at the hospital.  In most cases, you will be given a medicine that makes you go to sleep (general anesthetic).  The surgeon will make a cut (incision) through the skin in your lower belly.  The incision may be about 5-7 inches long. It may go side-to-side or up-and-down.  The surgeon will move aside the body tissue that covers your uterus. The surgeon will then carefully take out your uterus along with any of your other reproductive organs that need to be removed.  Bleeding will be controlled with clamps or sutures.  The surgeon will close your incision with sutures or metal clips. What happens after the procedure?  You will have some pain immediately after the procedure.  You will be given pain medicine in the recovery room.  You will be taken to your hospital room when you have recovered from the anesthesia.  You may need to stay in the hospital for 2-5 days.  You will be given instructions for recovery at home. This information is not intended to replace advice given to you by your health care provider. Make sure you discuss any questions you have with your health care provider. Document Released: 01/24/2013 Document Revised: 06/27/2015 Document Reviewed: 11/11/2012 Elsevier Interactive Patient Education  2017  [employer]  Uterine Fibroids Uterine fibroids are tissue masses (tumors). They are also called leiomyomas. They can develop inside of a woman's womb (uterus). They can grow very large. Fibroids are not cancerous (benign). Most fibroids do not require medical  treatment. Follow these instructions at home:  Keep all follow-up visits as told by your doctor. This is important.  Take medicines only as told by your doctor.  If you were prescribed a hormone treatment, take the hormone medicines exactly as told.  Do not take aspirin. It can cause bleeding.  Ask your doctor about taking iron pills and increasing the amount of dark green, leafy vegetables in your diet. These actions can help to boost your blood iron levels.  Pay close attention to your period. Tell your doctor about any changes, such as:  Increased blood flow. This may require you to use more pads or tampons than usual per month.  A change in the number of days that your period lasts per month.  A change in symptoms that come with your period, such as back pain or cramping in your belly area (abdomen). Contact a doctor if:  You have pain in your back or the area between your hip bones (pelvic area) that is not controlled by medicines.  You have pain in your abdomen that is not controlled with medicines.  You have an increase in bleeding between and during periods.  You soak tampons or pads in a half hour or less.  You feel lightheaded.  You feel extra tired.  You feel weak. Get help right away if:  You pass out (faint).  You have a sudden increase in pelvic pain. This information is not intended to replace advice given to you by your health care provider. Make sure you discuss any questions you have with your health care provider. Document Released: 02/21/2010 Document Revised: 09/20/2015 Document Reviewed: 07/18/2013 Elsevier Interactive Patient Education  2017 Elsevier Inc.  Myomectomy Myomectomy is surgery to remove a noncancerous tumor (myoma) from the uterus. Myomas are tumors made up of fibrous tissue. They are often called fibroid tumors. Fibroid tumors can range from the size of a pea to the size of a grapefruit. In a myomectomy, the fibroid tumor is removed  without removing the uterus. Because these tumors are rarely cancerous, this surgery is usually done only if the tumor is growing or causing symptoms such as pain, pressure, bleeding, or pain with intercourse. LET [facility] CARE PROVIDER KNOW ABOUT:  Any allergies you have.  All medicines you are taking, including vitamins, herbs, eye drops, creams, and over-the-counter medicines.  Previous problems you or members of your family have had with the use of anesthetics.  Any blood disorders you have.  Previous surgeries you have had.  Medical conditions you have. RISKS AND COMPLICATIONS  Generally, this is a safe procedure. However, as with any procedure, complications can occur. Possible complications include:  Excessive bleeding.  Infection.  Injury to nearby organs.  Blood clots in the legs, chest, or brain.  Scar tissue on other organs and in the pelvis. This may require another surgery to remove the scar tissue. BEFORE THE PROCEDURE  Ask your health care provider about changing or stopping your regular medicines. Avoid taking aspirin or blood thinners as directed by your health care provider.  Do not  eat or drink anything after midnight on the night before surgery.  If you smoke, do not  smoke for 2 weeks before the surgery.  Do not  drink alcohol the day before the surgery.  Arrange for someone to drive you home after the procedure or after your hospital stay. Also arrange for someone to help you with activities during your recovery. PROCEDURE You will be given medicine to make you sleep through the procedure (general anesthetic). Any of the following methods may be used to perform a myomectomy:  Small monitors will be put on your body. They are used to check your heart, blood pressure, and oxygen level.  An IV access tube will be put into one of your veins. Medicine will be able to flow directly into your body through this IV tube.  You might be given a medicine to  help you relax (sedative).  You will be given a medicine to make you sleep (general anesthetic). A breathing tube will be placed into your lungs during the procedure.  A thin, flexible tube (catheter) will be inserted into your bladder to collect urine.  Any of the following methods may be used to perform a myomectomy:  Hysteroscopic myomectomy-This method may be used when the fibroid tumor is inside the cavity of the uterus. A long, thin tube that is like a telescope (hysteroscope) is inserted inside the uterus. A saline solution is put into your uterus. This expands the uterus and allows the surgeon to see the fibroids. Tools are passed through the hysteroscope to remove the fibroid tumor in pieces.  Laparoscopic myomectomy-A few small cuts (incisions) are made in the lower abdomen. A thin, lighted tube with a tiny camera on the end (laparoscope) is inserted through one of the incisions. This gives the surgeon a good view of the area. The fibroid tumor is removed through the other incisions. The incisions are then closed with stitches (sutures) or staples.  Abdominal myomectomy-This method is used when the fibroid tumor cannot be removed with a hysteroscope or laparoscope. The surgery is performed through a larger surgical incision in the abdomen. The fibroid tumor is removed through this incision. The incision is closed with sutures or staples. AFTER THE PROCEDURE  If you had a laparoscopic or hysteroscopic myomectomy, you may be able to go home the same day, or you may need to stay in the hospital overnight.  If you had an abdominal myomectomy, you may need to stay in the hospital for a few days.  Your IV access tube and catheter will be removed in 1-2 days.  You may be given medicine for pain or to help you sleep.  You may be given an antibiotic medicine, if needed. This information is not intended to replace advice given to you by your health care provider. Make sure you discuss any  questions you have with your health care provider. Document Released: 11/16/2006 Document Revised: 11/09/2012 Document Reviewed: 08/31/2012 Elsevier Interactive Patient Education  2017 [employer].

## 2016-03-24 ENCOUNTER — Encounter: Payer: Self-pay | Admitting: Obstetrics and Gynecology

## 2016-03-24 ENCOUNTER — Ambulatory Visit: Payer: Medicaid Other | Admitting: Obstetrics and Gynecology

## 2016-03-24 NOTE — Progress Notes (Signed)
Patient did not keep GYN appointment for 03/24/2016.  Yanette Tripoli, Jr MD Attending [facility] (Faculty Practice)   

## 2017-09-27 ENCOUNTER — Ambulatory Visit (HOSPITAL_COMMUNITY)
Admission: EM | Admit: 2017-09-27 | Discharge: 2017-09-27 | Disposition: A | Discharge status: Home / Self Care | Payer: Medicaid Other | Attending: Family Medicine | Admitting: Family Medicine

## 2017-09-27 ENCOUNTER — Encounter (HOSPITAL_COMMUNITY): Payer: Self-pay | Admitting: Emergency Medicine

## 2017-09-27 DIAGNOSIS — R197 Diarrhea, unspecified: Secondary | ICD-10-CM | POA: Insufficient documentation

## 2017-09-27 DIAGNOSIS — Z3202 Encounter for pregnancy test, result negative: Secondary | ICD-10-CM | POA: Diagnosis not present

## 2017-09-27 DIAGNOSIS — R112 Nausea with vomiting, unspecified: Secondary | ICD-10-CM | POA: Insufficient documentation

## 2017-09-27 DIAGNOSIS — R350 Frequency of micturition: Secondary | ICD-10-CM | POA: Insufficient documentation

## 2017-09-27 DIAGNOSIS — R1013 Epigastric pain: Secondary | ICD-10-CM | POA: Diagnosis not present

## 2017-09-27 DIAGNOSIS — Z79899 Other long term (current) drug therapy: Secondary | ICD-10-CM | POA: Insufficient documentation

## 2017-09-27 DIAGNOSIS — N898 Other specified noninflammatory disorders of vagina: Secondary | ICD-10-CM | POA: Diagnosis present

## 2017-09-27 LAB — POCT URINALYSIS DIP (DEVICE)
Bilirubin Urine: NEGATIVE
Glucose, UA: NEGATIVE mg/dL
Ketones, ur: NEGATIVE mg/dL
Leukocytes, UA: NEGATIVE
Nitrite: NEGATIVE
Protein, ur: >=300 mg/dL — AB
Specific Gravity, Urine: 1.02 (ref 1.005–1.030)
Urobilinogen, UA: 1 mg/dL (ref 0.0–1.0)
pH: 8.5 — ABNORMAL HIGH (ref 5.0–8.0)

## 2017-09-27 LAB — POCT PREGNANCY, URINE: Preg Test, Ur: NEGATIVE

## 2017-09-27 MED ORDER — ONDANSETRON 4 MG PO TBDP: 4.0000 mg | ORAL_TABLET | Freq: Three times a day (TID) | ORAL | 0 refills | Status: DC | PRN

## 2017-09-27 MED ORDER — ONDANSETRON 4 MG PO TBDP
ORAL_TABLET | ORAL | Status: AC
  Filled 2017-09-27: qty 1

## 2017-09-27 MED ORDER — ONDANSETRON 4 MG PO TBDP
4.0000 mg | ORAL_TABLET | Freq: Once | ORAL | Status: AC
  Administered 2017-09-27: 4 mg via ORAL

## 2017-09-27 NOTE — ED Triage Notes (Signed)
Pt here nausea and vomiting x 2 days with some vaginal discharge

## 2017-09-27 NOTE — Discharge Instructions (Signed)
Urine negative for infection, pregnancy. Zofran for nausea and vomiting as needed. Keep hydrated, you urine should be clear to pale yellow in color. Bland diet, advance as tolerated. Cytology sent, you will be contacted with any positive results that requires further treatment. Refrain from sexual activity for the next 7 days.  If experiencing worsening symptoms, worsening abdominal pain, nausea/vomiting not controlled by medication, fever, unwilling to jump up and down/walk due to pain, go to emergency department for further evaluation.

## 2017-09-27 NOTE — ED Notes (Signed)
Not in treatment room

## 2017-09-27 NOTE — ED Provider Notes (Signed)
[place]    CSN: [id number] Arrival date & time: 09/27/17  1128     History   Chief Complaint Chief Complaint  Patient presents with  . Emesis  . Vaginal Discharge    HPI [name] is a 42 y.o. female.   42 year old female comes in for 2-day history of epigastric pain, nausea, vomiting, diarrhea.  States abdominal pain is intermittent, at the epigastric region, cramping in sensation, worse with emesis.  She has had 6-8 episodes of nonbilious nonbloody emesis.  Had one episode of loose stools.  Denies fever, chills, night sweats.  Denies other URI symptoms such as cough, congestion, sore throat.  States has had urinary frequency, but unsure if it is associated with more fluid intake.  Denies dysuria, hematuria.  Has also had intermittent vaginal discharge without vaginal itching or pain.  States she had not been sexually active for a while, but was recently sexually active with one female partner without condom use.  LMP 09/03/2017.  She has been able to tolerate fluids, but has been avoiding eating.  She denies history of pancreatitis.  Denies any new medication.  History of C-section, no other abdominal surgeries.     Past Medical History:  Diagnosis Date  . Anemia   . Asthma   . Gestational diabetes   . Uterine fibroid     Patient Active Problem List   Diagnosis Date Noted  . Sepsis ([place]) 12/30/2015  . Fibroid uterus 12/30/2015  . Microcytic hypochromic anemia 05/27/2015    Past Surgical History:  Procedure Laterality Date  . arm surgery    . CERVICAL CERCLAGE    . CESAREAN SECTION    . DILATION AND CURETTAGE OF UTERUS    . TOOTH EXTRACTION      OB History    Gravida  4   Para  3   Term  2   Preterm  1   AB  1   Living  2     SAB  1   TAB      Ectopic      Multiple  1   Live Births  3            Home Medications    Prior to Admission medications   Medication Sig Start Date End Date Taking? Authorizing Provider    albuterol (PROVENTIL HFA;VENTOLIN HFA) 108 (90 Base) MCG/ACT inhaler Inhale 2 puffs into the lungs every 6 (six) hours as needed for wheezing or shortness of breath.    [provider]  ferrous sulfate 325 (65 FE) MG EC tablet Take 1 tablet (325 mg total) by mouth 2 (two) times daily. 01/08/16   Woodroe Mode, MD  megestrol (MEGACE) 40 MG tablet Take 1 tablet (40 mg total) by mouth 2 (two) times daily. 01/20/16   Woodroe Mode, MD  ondansetron (ZOFRAN ODT) 4 MG disintegrating tablet Take 1 tablet (4 mg total) by mouth every 8 (eight) hours as needed for nausea or vomiting. 09/27/17   Tasia Catchings, Amy V, PA-C  Prenatal Multivit-Min-Fe-FA (PRENATAL VITAMINS) 0.8 MG tablet Take 1 tablet by mouth daily. 01/20/16   Woodroe Mode, MD    Family History Family History  Problem Relation Age of Onset  . Hypertension Mother   . Hypertension Brother   . Hypertension Maternal Uncle     Social History Social History   Tobacco Use  . Smoking status: Never Smoker  . Smokeless tobacco: Never Used  Substance Use Topics  .  Alcohol use: No  . Drug use: No     Allergies   Shellfish allergy and Other   Review of Systems Review of Systems  Reason unable to perform ROS: See HPI as above.     Physical Exam Triage Vital Signs ED Triage Vitals [09/27/17 1201]  Enc Vitals Group     BP 140/64     Pulse Rate 80     Resp 16     Temp 98.3 F (36.8 C)     Temp Source Oral     SpO2 100 %     Weight      Height      Head Circumference      Peak Flow      Pain Score      Pain Loc      Pain Edu?      Excl. in [place]?    No data found.  Updated Vital Signs BP 140/64 (BP Location: Left Arm)   Pulse 80   Temp 98.3 F (36.8 C) (Oral)   Resp 16   SpO2 100%   Physical Exam  Constitutional: She is oriented to person, place, and time. She appears well-developed and well-nourished. No distress.  HENT:  Head: Normocephalic and atraumatic.  Eyes: Pupils are equal, round, and reactive to  light. Conjunctivae are normal.  Cardiovascular: Normal rate, regular rhythm and normal heart sounds. Exam reveals no gallop and no friction rub.  No murmur heard. Pulmonary/Chest: Effort normal and breath sounds normal. She has no wheezes. She has no rales.  Abdominal: Soft. Bowel sounds are normal. There is no tenderness. There is no rigidity, no rebound, no guarding and no CVA tenderness.  Uterus able to be palpated on abdominal exam, patient states history of fibroids, size is at baseline.   Neurological: She is alert and oriented to person, place, and time.  Skin: Skin is warm and dry.  Psychiatric: She has a normal mood and affect. Her behavior is normal. Judgment normal.     UC Treatments / Results  Labs (all labs ordered are listed, but only abnormal results are displayed) Labs Reviewed  POCT URINALYSIS DIP (DEVICE) - Abnormal; Notable for the following components:      Result Value   Hgb urine dipstick TRACE (*)    pH 8.5 (*)    Protein, ur >=300 (*)    All other components within normal limits  POCT PREGNANCY, URINE  CERVICOVAGINAL ANCILLARY ONLY    EKG None  Radiology No results found.  Procedures Procedures (including critical care time)  Medications Ordered in UC Medications  ondansetron (ZOFRAN-ODT) disintegrating tablet 4 mg (4 mg Oral Given 09/27/17 1240)    Initial Impression / Assessment and Plan / UC Course  I have reviewed the triage vital signs and the nursing notes.  Pertinent labs & imaging results that were available during my care of the patient were reviewed by me and considered in my medical decision making (see chart for details).    No alarming signs on exam. Urine negative for UTI/pregnancy. Patient with mild improvement of symptoms after zofran. Will have patient continue. Bland diet, advance as tolerated. Push fluids. Cytology sent. Return precautions given. Patient expresses understanding and agrees to plan.  Final Clinical  Impressions(s) / UC Diagnoses   Final diagnoses:  Nausea vomiting and diarrhea  Abdominal pain, epigastric  Vaginal discharge   ED Prescriptions    Medication Sig Dispense Auth. Provider   ondansetron (ZOFRAN ODT) 4 MG disintegrating tablet Take  1 tablet (4 mg total) by mouth every 8 (eight) hours as needed for nausea or vomiting. 15 tablet Tobin Chad, [place] 09/27/17 1327

## 2017-09-28 LAB — CERVICOVAGINAL ANCILLARY ONLY
Bacterial vaginitis: POSITIVE — AB
Candida vaginitis: NEGATIVE
Chlamydia: NEGATIVE
Neisseria Gonorrhea: NEGATIVE
Trichomonas: NEGATIVE

## 2017-09-30 ENCOUNTER — Telehealth (HOSPITAL_COMMUNITY): Payer: Self-pay

## 2017-09-30 MED ORDER — METRONIDAZOLE 500 MG PO TABS: 500.0000 mg | ORAL_TABLET | Freq: Two times a day (BID) | ORAL | 0 refills | Status: DC

## 2018-05-02 ENCOUNTER — Other Ambulatory Visit: Payer: Self-pay | Admitting: Obstetrics and Gynecology

## 2018-05-03 ENCOUNTER — Other Ambulatory Visit: Payer: Self-pay | Admitting: Obstetrics and Gynecology

## 2018-05-03 DIAGNOSIS — D259 Leiomyoma of uterus, unspecified: Secondary | ICD-10-CM

## 2018-07-04 ENCOUNTER — Ambulatory Visit
Admission: RE | Admit: 2018-07-04 | Discharge: 2018-07-04 | Disposition: A | Discharge status: Home / Self Care | Payer: Medicaid Other | Source: Ambulatory Visit | Attending: Obstetrics and Gynecology | Admitting: Obstetrics and Gynecology

## 2018-07-04 DIAGNOSIS — D259 Leiomyoma of uterus, unspecified: Secondary | ICD-10-CM

## 2018-08-09 ENCOUNTER — Other Ambulatory Visit: Payer: Self-pay | Admitting: Obstetrics and Gynecology

## 2018-08-09 DIAGNOSIS — Z1231 Encounter for screening mammogram for malignant neoplasm of breast: Secondary | ICD-10-CM

## 2018-08-16 ENCOUNTER — Other Ambulatory Visit: Payer: Self-pay | Admitting: Obstetrics and Gynecology

## 2018-08-16 DIAGNOSIS — D219 Benign neoplasm of connective and other soft tissue, unspecified: Secondary | ICD-10-CM

## 2018-09-08 ENCOUNTER — Other Ambulatory Visit: Payer: Medicaid Other

## 2018-09-09 ENCOUNTER — Other Ambulatory Visit: Payer: Medicaid Other

## 2018-09-14 ENCOUNTER — Ambulatory Visit (HOSPITAL_COMMUNITY)
Admission: EM | Admit: 2018-09-14 | Discharge: 2018-09-14 | Disposition: A | Discharge status: Home / Self Care | Payer: Self-pay | Attending: Family Medicine | Admitting: Family Medicine

## 2018-09-14 ENCOUNTER — Encounter (HOSPITAL_COMMUNITY): Payer: Self-pay

## 2018-09-14 ENCOUNTER — Other Ambulatory Visit: Payer: Self-pay

## 2018-09-14 DIAGNOSIS — H9203 Otalgia, bilateral: Secondary | ICD-10-CM

## 2018-09-14 MED ORDER — FLUTICASONE PROPIONATE 50 MCG/ACT NA SUSP: 1.0000 | Freq: Every day | NASAL | 0 refills | Status: DC

## 2018-09-14 MED ORDER — IBUPROFEN 600 MG PO TABS: 600.0000 mg | ORAL_TABLET | Freq: Four times a day (QID) | ORAL | 0 refills | Status: DC | PRN

## 2018-09-14 NOTE — ED Triage Notes (Signed)
Pt states she thinks she has a very bad ear infection in both ears but more on the right. This has been going for 3 or 4 days.

## 2018-09-14 NOTE — Discharge Instructions (Signed)
Your ear exam was normal  Begin flonase nasal spray 1-2 each nostril daily consistently over the next 1-2 weeks  Use anti-inflammatories for pain/swelling. You may take up to 800 mg Ibuprofen every 8 hours with food. You may supplement Ibuprofen with Tylenol [phone] mg every 8 hours.   Follow up if pain not resolving or worsening

## 2018-09-15 NOTE — ED Provider Notes (Signed)
[facility]    CSN: [id number] Arrival date & time: 09/14/18  1738      History   Chief Complaint Chief Complaint  Patient presents with  . Otalgia    HPI [name] is a 43 y.o. female history of asthma, presenting today for evaluation of ear pain.  Patient states that over the past week she has had some discomfort in her ears, more prominent on right.  Over the past 3 to 4 days her symptoms have worsened.  Denies changes in hearing, denies muffled sensation.  She notes some mild discomfort just anterior to her ear.  Denies fevers.  Denies drainage, but has felt as if her ears are "wet".  Denies associated congestion or sore throat.  Has been eating and drinking like normal.  Denies any neck swelling or swollen lymph nodes.  Denies cough, congestion.  HPI  Past Medical History:  Diagnosis Date  . Anemia   . Asthma   . Gestational diabetes   . Uterine fibroid     Patient Active Problem List   Diagnosis Date Noted  . Sepsis ([place]) 12/30/2015  . Fibroid uterus 12/30/2015  . Microcytic hypochromic anemia 05/27/2015    Past Surgical History:  Procedure Laterality Date  . arm surgery    . CERVICAL CERCLAGE    . CESAREAN SECTION    . DILATION AND CURETTAGE OF UTERUS    . TOOTH EXTRACTION      OB History    Gravida  4   Para  3   Term  2   Preterm  1   AB  1   Living  2     SAB  1   TAB      Ectopic      Multiple  1   Live Births  3            Home Medications    Prior to Admission medications   Medication Sig Start Date End Date Taking? Authorizing Provider  albuterol (PROVENTIL HFA;VENTOLIN HFA) 108 (90 Base) MCG/ACT inhaler Inhale 2 puffs into the lungs every 6 (six) hours as needed for wheezing or shortness of breath.    [provider]  ferrous sulfate 325 (65 FE) MG EC tablet Take 1 tablet (325 mg total) by mouth 2 (two) times daily. 01/08/16   Woodroe Mode, MD  fluticasone (FLONASE) 50 MCG/ACT nasal spray Place  1-2 sprays into both nostrils daily for 7 days. 09/14/18 09/21/18  Sequan Auxier C, PA-C  ibuprofen (ADVIL) 600 MG tablet Take 1 tablet (600 mg total) by mouth every 6 (six) hours as needed. 09/14/18   Fender Herder C, PA-C  megestrol (MEGACE) 40 MG tablet Take 1 tablet (40 mg total) by mouth 2 (two) times daily. 01/20/16   Woodroe Mode, MD  metroNIDAZOLE (FLAGYL) 500 MG tablet Take 1 tablet (500 mg total) by mouth 2 (two) times daily. 09/30/17   Raylene Everts, MD  ondansetron (ZOFRAN ODT) 4 MG disintegrating tablet Take 1 tablet (4 mg total) by mouth every 8 (eight) hours as needed for nausea or vomiting. 09/27/17   Tasia Catchings, Amy V, PA-C  Prenatal Multivit-Min-Fe-FA (PRENATAL VITAMINS) 0.8 MG tablet Take 1 tablet by mouth daily. 01/20/16   Woodroe Mode, MD    Family History Family History  Problem Relation Age of Onset  . Hypertension Mother   . Hypertension Brother   . Hypertension Maternal Uncle     Social History Social History  Tobacco Use  . Smoking status: Never Smoker  . Smokeless tobacco: Never Used  Substance Use Topics  . Alcohol use: No  . Drug use: No     Allergies   Shellfish allergy and Other   Review of Systems Review of Systems  Constitutional: Negative for activity change, appetite change, chills, fatigue and fever.  HENT: Positive for ear pain. Negative for congestion, rhinorrhea, sinus pressure, sore throat and trouble swallowing.   Eyes: Negative for discharge and redness.  Respiratory: Negative for cough, chest tightness and shortness of breath.   Cardiovascular: Negative for chest pain.  Gastrointestinal: Negative for abdominal pain, diarrhea, nausea and vomiting.  Musculoskeletal: Negative for myalgias.  Skin: Negative for rash.  Neurological: Negative for dizziness, light-headedness and headaches.     Physical Exam Triage Vital Signs ED Triage Vitals  Enc Vitals Group     BP 09/14/18 1806 119/61     Pulse Rate 09/14/18 1806 88     Resp  09/14/18 1806 16     Temp 09/14/18 1806 99.1 F (37.3 C)     Temp Source 09/14/18 1806 Oral     SpO2 09/14/18 1806 100 %     Weight 09/14/18 1804 162 lb (73.5 kg)     Height --      Head Circumference --      Peak Flow --      Pain Score 09/14/18 1803 7     Pain Loc --      Pain Edu? --      Excl. in [place]? --    No data found.  Updated Vital Signs BP 119/61 (BP Location: Right Arm)   Pulse 88   Temp 99.1 F (37.3 C) (Oral)   Resp 16   Wt 162 lb (73.5 kg)   LMP 09/01/2018   SpO2 100%   BMI 27.81 kg/m   Visual Acuity Right Eye Distance:   Left Eye Distance:   Bilateral Distance:    Right Eye Near:   Left Eye Near:    Bilateral Near:     Physical Exam Vitals signs and nursing note reviewed.  Constitutional:      General: She is not in acute distress.    Appearance: She is well-developed.  HENT:     Head: Normocephalic and atraumatic.     Ears:     Comments: Bilateral ears without tenderness to palpation of external auricle, tragus and mastoid, EAC's without erythema or swelling, TM's with good bony landmarks and cone of light. Non erythematous.     Nose:     Comments: Nasal mucosa pink, mildly swollen turbinates, no rhinorrhea    Mouth/Throat:     Comments: Oral mucosa pink and moist, no tonsillar enlargement or exudate. Posterior pharynx patent and nonerythematous, no uvula deviation or swelling. Normal phonation.  Eyes:     Conjunctiva/sclera: Conjunctivae normal.  Neck:     Musculoskeletal: Neck supple.     Comments: No palpable lymphadenopathy Cardiovascular:     Rate and Rhythm: Normal rate and regular rhythm.     Heart sounds: No murmur.  Pulmonary:     Effort: Pulmonary effort is normal. No respiratory distress.     Breath sounds: Normal breath sounds.     Comments: Breathing comfortably at rest, CTABL, no wheezing, rales or other adventitious sounds auscultated  Abdominal:     Palpations: Abdomen is soft.     Tenderness: There is no abdominal  tenderness.  Skin:    General: Skin is warm  and dry.  Neurological:     Mental Status: She is alert.      UC Treatments / Results  Labs (all labs ordered are listed, but only abnormal results are displayed) Labs Reviewed - No data to display  EKG   Radiology No results found.  Procedures Procedures (including critical care time)  Medications Ordered in UC Medications - No data to display  Initial Impression / Assessment and Plan / UC Course  I have reviewed the triage vital signs and the nursing notes.  Pertinent labs & imaging results that were available during my care of the patient were reviewed by me and considered in my medical decision making (see chart for details).     Ear exam unremarkable, no sign of infection.  No palpable lymph nodes or sign of sialadenitis.  Oropharynx unremarkable.  This time will treat for possible eustachian tube dysfunction as well as to have continue taking anti-inflammatories.  Possible TMJ.  Flonase and ibuprofen provided.Discussed strict return precautions. Patient verbalized understanding and is agreeable with plan.  Final Clinical Impressions(s) / UC Diagnoses   Final diagnoses:  Otalgia of both ears     Discharge Instructions     Your ear exam was normal  Begin flonase nasal spray 1-2 each nostril daily consistently over the next 1-2 weeks  Use anti-inflammatories for pain/swelling. You may take up to 800 mg Ibuprofen every 8 hours with food. You may supplement Ibuprofen with Tylenol [phone] mg every 8 hours.   Follow up if pain not resolving or worsening   ED Prescriptions    Medication Sig Dispense Auth. Provider   fluticasone (FLONASE) 50 MCG/ACT nasal spray Place 1-2 sprays into both nostrils daily for 7 days. 1 g Ailsa Mireles C, PA-C   ibuprofen (ADVIL) 600 MG tablet Take 1 tablet (600 mg total) by mouth every 6 (six) hours as needed. 30 tablet Macaila Tahir, [place] C, PA-C     Controlled Substance Prescriptions  [place] Controlled Substance Registry consulted? Not Applicable   Janith Lima, [place] 09/15/18 [phone]

## 2018-11-05 ENCOUNTER — Other Ambulatory Visit: Payer: Self-pay

## 2018-11-05 ENCOUNTER — Encounter (HOSPITAL_COMMUNITY): Payer: Self-pay | Admitting: Emergency Medicine

## 2018-11-05 DIAGNOSIS — J45909 Unspecified asthma, uncomplicated: Secondary | ICD-10-CM | POA: Insufficient documentation

## 2018-11-05 DIAGNOSIS — Z79899 Other long term (current) drug therapy: Secondary | ICD-10-CM | POA: Insufficient documentation

## 2018-11-05 DIAGNOSIS — J019 Acute sinusitis, unspecified: Secondary | ICD-10-CM | POA: Insufficient documentation

## 2018-11-05 DIAGNOSIS — B9689 Other specified bacterial agents as the cause of diseases classified elsewhere: Secondary | ICD-10-CM | POA: Insufficient documentation

## 2018-11-05 NOTE — ED Triage Notes (Signed)
Pt reports right sided headache with ear pain for the last several weeks. Pt reports being seen at urgent care several weeks ago and they suggested sinusitis.

## 2018-11-06 ENCOUNTER — Emergency Department (HOSPITAL_COMMUNITY)
Admission: EM | Admit: 2018-11-06 | Discharge: 2018-11-06 | Disposition: A | Discharge status: Home / Self Care | Payer: Medicaid Other | Attending: Emergency Medicine | Admitting: Emergency Medicine

## 2018-11-06 DIAGNOSIS — B9689 Other specified bacterial agents as the cause of diseases classified elsewhere: Secondary | ICD-10-CM

## 2018-11-06 MED ORDER — AMOXICILLIN-POT CLAVULANATE 875-125 MG PO TABS: 1.0000 | ORAL_TABLET | Freq: Two times a day (BID) | ORAL | 0 refills | Status: AC

## 2018-11-06 NOTE — Discharge Instructions (Addendum)
Thank you for allowing me to care for you today in the Emergency Department.   Take 1 tablet of Augmentin 2 times daily for the next 10 days.  Make sure to complete the entire course of antibiotics even if your symptoms are improved.  Use Flonase as previously prescribed.  I have attached information on how to perform sinus rinses.  These may significantly help with the headache and pressure that you have been feeling.  Call the number on the discharge paperwork to get established with a primary care provider for follow-up if your symptoms do not improve.  You can continue to use 650 mg of Tylenol once every 6 hours as needed for headaches.  Return to the emergency department if you develop high fevers with worsening headache, changes in your vision, new numbness or weakness, no fever, unable to move your neck, or other new, concerning symptoms.

## 2018-11-06 NOTE — ED Provider Notes (Signed)
[facility] DEPT Provider Note   CSN: [id number] Arrival date & time: 11/05/18  2059     History   Chief Complaint Chief Complaint  Patient presents with  . Otalgia  . Headache    HPI [name] is a 43 y.o. female with a history of anemia, gestational diabetes, and uterine fibroids who presents the emergency department with a chief complaint of bilateral ear pain.  The patient endorses that she has been having constant bilateral ear pain accompanied by bilateral frontal headache that she characterizes as pressure-like for the last few weeks.  She reports the ear pain has been worsening since onset.  She reports some postnasal drip and rhinorrhea.  She was seen by urgent care the same several weeks ago and was discharged with Flonase.  She has not started taking the medication, but has been taking Tylenol and ibuprofen for her headaches with minimal improvement.  She denies visual changes, numbness, weakness, slurred speech, neck pain or stiffness, fever, chills, chest pain, shortness of breath, rash, abdominal pain, nausea, vomiting, or diarrhea.     The history is provided by the patient. No language interpreter was used.  Headache Associated symptoms: congestion, drainage, ear pain and sinus pressure   Associated symptoms: no abdominal pain, no back pain, no diarrhea, no dizziness, no fever, no hearing loss, no nausea, no neck pain, no neck stiffness, no seizures, no sore throat, no vomiting and no weakness     Past Medical History:  Diagnosis Date  . Anemia   . Asthma   . Gestational diabetes   . Uterine fibroid     Patient Active Problem List   Diagnosis Date Noted  . Sepsis ([place]) 12/30/2015  . Fibroid uterus 12/30/2015  . Microcytic hypochromic anemia 05/27/2015    Past Surgical History:  Procedure Laterality Date  . arm surgery    . CERVICAL CERCLAGE    . CESAREAN SECTION    . DILATION AND CURETTAGE OF UTERUS    . TOOTH  EXTRACTION       OB History    Gravida  4   Para  3   Term  2   Preterm  1   AB  1   Living  2     SAB  1   TAB      Ectopic      Multiple  1   Live Births  3            Home Medications    Prior to Admission medications   Medication Sig Start Date End Date Taking? Authorizing Provider  albuterol (PROVENTIL HFA;VENTOLIN HFA) 108 (90 Base) MCG/ACT inhaler Inhale 2 puffs into the lungs every 6 (six) hours as needed for wheezing or shortness of breath.    [provider]  amoxicillin-clavulanate (AUGMENTIN) 875-125 MG tablet Take 1 tablet by mouth every 12 (twelve) hours for 10 days. 11/06/18 11/16/18  ,  A, PA-C  ferrous sulfate 325 (65 FE) MG EC tablet Take 1 tablet (325 mg total) by mouth 2 (two) times daily. 01/08/16   Woodroe Mode, MD  fluticasone (FLONASE) 50 MCG/ACT nasal spray Place 1-2 sprays into both nostrils daily for 7 days. 09/14/18 09/21/18  Wieters, Hallie C, PA-C  ibuprofen (ADVIL) 600 MG tablet Take 1 tablet (600 mg total) by mouth every 6 (six) hours as needed. 09/14/18   Wieters, Hallie C, PA-C  megestrol (MEGACE) 40 MG tablet Take 1 tablet (40 mg total) by mouth  2 (two) times daily. 01/20/16   Woodroe Mode, MD  metroNIDAZOLE (FLAGYL) 500 MG tablet Take 1 tablet (500 mg total) by mouth 2 (two) times daily. 09/30/17   Raylene Everts, MD  ondansetron (ZOFRAN ODT) 4 MG disintegrating tablet Take 1 tablet (4 mg total) by mouth every 8 (eight) hours as needed for nausea or vomiting. 09/27/17   Tasia Catchings, Amy V, PA-C  Prenatal Multivit-Min-Fe-FA (PRENATAL VITAMINS) 0.8 MG tablet Take 1 tablet by mouth daily. 01/20/16   Woodroe Mode, MD    Family History Family History  Problem Relation Age of Onset  . Hypertension Mother   . Hypertension Brother   . Hypertension Maternal Uncle     Social History Social History   Tobacco Use  . Smoking status: Never Smoker  . Smokeless tobacco: Never Used  Substance Use Topics  . Alcohol use:  No  . Drug use: No     Allergies   Shellfish allergy and Other   Review of Systems Review of Systems  Constitutional: Negative for activity change, chills and fever.  HENT: Positive for congestion, ear pain, postnasal drip, sinus pressure and sinus pain. Negative for ear discharge, facial swelling, hearing loss, sore throat, trouble swallowing and voice change.   Respiratory: Negative for shortness of breath.   Cardiovascular: Negative for chest pain.  Gastrointestinal: Negative for abdominal pain, diarrhea, nausea and vomiting.  Genitourinary: Negative for dysuria.  Musculoskeletal: Negative for back pain, neck pain and neck stiffness.  Skin: Negative for rash.  Allergic/Immunologic: Negative for immunocompromised state.  Neurological: Positive for headaches. Negative for dizziness, seizures, syncope and weakness.  Psychiatric/Behavioral: Negative for confusion.     Physical Exam Updated Vital Signs BP (!) 143/82 (BP Location: Right Arm)   Pulse 83   Temp 99.3 F (37.4 C) (Oral)   Resp 16   Wt 76.2 kg   LMP 10/24/2018   SpO2 99%   BMI 28.82 kg/m   Physical Exam Vitals signs and nursing note reviewed.  Constitutional:      General: She is not in acute distress.    Comments: Well-appearing  HENT:     Head: Normocephalic.     Comments: Purulent effusion to the bilateral TMs.  No mastoid tenderness bilaterally.  Bilateral canals are unremarkable.  Tender to palpation over the bilateral frontal sinuses.  No maxillary sinus tenderness bilateral.  Turbinates are edematous.    Right Ear: Ear canal normal.     Left Ear: Ear canal normal.     Mouth/Throat:     Mouth: Mucous membranes are moist.     Pharynx: Oropharynx is clear. Uvula midline. No pharyngeal swelling, oropharyngeal exudate, posterior oropharyngeal erythema or uvula swelling.  Eyes:     General: No scleral icterus.       Right eye: No discharge.     Conjunctiva/sclera: Conjunctivae normal.  Neck:      Musculoskeletal: Normal range of motion and neck supple.     Comments: No meningismus Cardiovascular:     Rate and Rhythm: Normal rate and regular rhythm.     Heart sounds: No murmur. No friction rub. No gallop.   Pulmonary:     Effort: Pulmonary effort is normal. No respiratory distress.  Abdominal:     General: There is no distension.     Palpations: Abdomen is soft.  Skin:    General: Skin is warm.     Findings: No rash.  Neurological:     Mental Status: She is alert.  Psychiatric:  Behavior: Behavior normal.      ED Treatments / Results  Labs (all labs ordered are listed, but only abnormal results are displayed) Labs Reviewed - No data to display  EKG None  Radiology No results found.  Procedures Procedures (including critical care time)  Medications Ordered in ED Medications - No data to display   Initial Impression / Assessment and Plan / ED Course  I have reviewed the triage vital signs and the nursing notes.  Pertinent labs & imaging results that were available during my care of the patient were reviewed by me and considered in my medical decision making (see chart for details).        43 year old female with a history of anemia, gestational diabetes, and uterine fibroids presenting with bilateral ear pain, sinus pain and pressure, and a frontal pressure-like headache for the last few weeks accompanied by some nasal congestion, postnasal drip and rhinorrhea.  Vital signs are normal.  On exam, she has bilateral purulent effusions to the TMs.  She has tenderness to palpation to the bilateral frontal sinuses.  Given the length of symptoms, will treat with Augmentin for acute bacterial sinusitis.  Doubt meningitis, septal cellulitis, or COVID-19.  Also recommended nasal saline rinses.  All questions answered.  ER return precautions given.  She is hemodynamically stable and in no acute distress.  Safe for discharge home with outpatient follow-up as needed.   Final Clinical Impressions(s) / ED Diagnoses   Final diagnoses:  Acute bacterial sinusitis    ED Discharge Orders         Ordered    amoxicillin-clavulanate (AUGMENTIN) 875-125 MG tablet  Every 12 hours     11/06/18 0130           ,  A, PA-C 11/06/18 [place], Ankit, MD 11/06/18 [phone]

## 2018-11-06 NOTE — ED Notes (Signed)
ED Provider at bedside. 

## 2018-11-15 ENCOUNTER — Ambulatory Visit (HOSPITAL_COMMUNITY)
Admission: EM | Admit: 2018-11-15 | Discharge: 2018-11-15 | Disposition: A | Discharge status: Home / Self Care | Payer: Medicaid Other

## 2018-11-15 ENCOUNTER — Encounter (HOSPITAL_COMMUNITY): Payer: Self-pay

## 2018-11-15 ENCOUNTER — Other Ambulatory Visit: Payer: Self-pay

## 2018-11-15 DIAGNOSIS — B9689 Other specified bacterial agents as the cause of diseases classified elsewhere: Secondary | ICD-10-CM

## 2018-11-15 DIAGNOSIS — J0110 Acute frontal sinusitis, unspecified: Secondary | ICD-10-CM

## 2018-11-15 MED ORDER — DOXYCYCLINE HYCLATE 100 MG PO CAPS: 100.0000 mg | ORAL_CAPSULE | Freq: Two times a day (BID) | ORAL | 0 refills | Status: DC

## 2018-11-15 NOTE — ED Triage Notes (Signed)
Pr states she has migraine and the pain feel like a band squeezing her head. Pt also states bilateral ear pain and pressure in her eyes x 4 weeks. Pt is taking Tylenol is helping with the pain.

## 2018-11-15 NOTE — Discharge Instructions (Addendum)
Stop taking the current antibiotic Augmentin.    Take the new antibiotic doxycycline as directed.    Continue to use the Flonase.    If your symptoms are not improving, follow-up with the ENT listed below.    Establish a primary care provider such as the one listed below.

## 2018-11-15 NOTE — ED Provider Notes (Addendum)
[place]    CSN: [id number] Arrival date & time: 11/15/18  1320      History   Chief Complaint Chief Complaint  Patient presents with   Migraine   Otalgia    HPI [name] is a 43 y.o. female.   Patient presents with ongoing bilateral ear pain.  She also reports a headache which she describes as "pressure" across her forehead and rates as 10/10.  She was seen here on 09/14/2018 for similar symptoms and started on Flonase.  She was seen at [facility] ED on 11/06/2018 and started on Augmentin for sinusitis.  She is taking Tylenol for the discomfort and reports good relief with this.  She denies fever, chills, sore throat, cough, shortness of breath, vomiting, diarrhea, or other symptoms.  LMP: 2 weeks.    The history is provided by the patient.    Past Medical History:  Diagnosis Date   Anemia    Asthma    Gestational diabetes    Uterine fibroid     Patient Active Problem List   Diagnosis Date Noted   Sepsis ([place]) 12/30/2015   Fibroid uterus 12/30/2015   Microcytic hypochromic anemia 05/27/2015    Past Surgical History:  Procedure Laterality Date   arm surgery     CERVICAL CERCLAGE     CESAREAN SECTION     DILATION AND CURETTAGE OF UTERUS     TOOTH EXTRACTION      OB History    Gravida  4   Para  3   Term  2   Preterm  1   AB  1   Living  2     SAB  1   TAB      Ectopic      Multiple  1   Live Births  3            Home Medications    Prior to Admission medications   Medication Sig Start Date End Date Taking? Authorizing Provider  acetaminophen (TYLENOL) 500 MG tablet Take 500 mg by mouth every 6 (six) hours as needed.   Yes [provider]  albuterol (PROVENTIL HFA;VENTOLIN HFA) 108 (90 Base) MCG/ACT inhaler Inhale 2 puffs into the lungs every 6 (six) hours as needed for wheezing or shortness of breath.    [provider]  amoxicillin-clavulanate (AUGMENTIN) 875-125 MG tablet Take 1  tablet by mouth every 12 (twelve) hours for 10 days. 11/06/18 11/16/18  McDonald, Mia A, PA-C  doxycycline (VIBRAMYCIN) 100 MG capsule Take 1 capsule (100 mg total) by mouth 2 (two) times daily. 11/15/18   Sharion Balloon, NP  ferrous sulfate 325 (65 FE) MG EC tablet Take 1 tablet (325 mg total) by mouth 2 (two) times daily. 01/08/16   Woodroe Mode, MD  fluticasone (FLONASE) 50 MCG/ACT nasal spray Place 1-2 sprays into both nostrils daily for 7 days. 09/14/18 09/21/18  Wieters, Hallie C, PA-C  ibuprofen (ADVIL) 600 MG tablet Take 1 tablet (600 mg total) by mouth every 6 (six) hours as needed. 09/14/18   Wieters, Hallie C, PA-C  megestrol (MEGACE) 40 MG tablet Take 1 tablet (40 mg total) by mouth 2 (two) times daily. 01/20/16   Woodroe Mode, MD  metroNIDAZOLE (FLAGYL) 500 MG tablet Take 1 tablet (500 mg total) by mouth 2 (two) times daily. 09/30/17   Raylene Everts, MD  ondansetron (ZOFRAN ODT) 4 MG disintegrating tablet Take 1 tablet (4 mg total) by mouth every 8 (  eight) hours as needed for nausea or vomiting. 09/27/17   Tasia Catchings, Amy V, PA-C  Prenatal Multivit-Min-Fe-FA (PRENATAL VITAMINS) 0.8 MG tablet Take 1 tablet by mouth daily. 01/20/16   Woodroe Mode, MD    Family History Family History  Problem Relation Age of Onset   Hypertension Mother    Hypertension Brother    Hypertension Maternal Uncle     Social History Social History   Tobacco Use   Smoking status: Never Smoker   Smokeless tobacco: Never Used  Substance Use Topics   Alcohol use: No   Drug use: No     Allergies   Shellfish allergy and Other   Review of Systems Review of Systems  Constitutional: Negative for chills and fever.  HENT: Positive for ear pain. Negative for sore throat.   Eyes: Negative for pain and visual disturbance.  Respiratory: Negative for cough and shortness of breath.   Cardiovascular: Negative for chest pain and palpitations.  Gastrointestinal: Negative for abdominal pain and vomiting.    Genitourinary: Negative for dysuria and hematuria.  Musculoskeletal: Negative for arthralgias and back pain.  Skin: Negative for color change and rash.  Neurological: Positive for headaches. Negative for dizziness, seizures, syncope, facial asymmetry, speech difficulty, weakness and numbness.  All other systems reviewed and are negative.    Physical Exam Triage Vital Signs ED Triage Vitals  Enc Vitals Group     BP      Pulse      Resp      Temp      Temp src      SpO2      Weight      Height      Head Circumference      Peak Flow      Pain Score      Pain Loc      Pain Edu?      Excl. in [place]?    No data found.  Updated Vital Signs BP 119/88 (BP Location: Left Arm)    Pulse 75    Temp 98.3 F (36.8 C) (Temporal)    Resp 16    LMP 10/24/2018    SpO2 100%   Visual Acuity Right Eye Distance:   Left Eye Distance:   Bilateral Distance:    Right Eye Near:   Left Eye Near:    Bilateral Near:     Physical Exam Vitals signs and nursing note reviewed.  Constitutional:      General: She is not in acute distress.    Appearance: She is well-developed. She is not ill-appearing.     Comments: Well-appearing, NAD.  HENT:     Head: Normocephalic and atraumatic.     Right Ear: Tympanic membrane and ear canal normal.     Left Ear: Tympanic membrane and ear canal normal.     Nose: Nose normal.     Mouth/Throat:     Mouth: Mucous membranes are moist.     Pharynx: Oropharynx is clear.  Eyes:     Conjunctiva/sclera: Conjunctivae normal.  Neck:     Musculoskeletal: Neck supple.  Cardiovascular:     Rate and Rhythm: Normal rate and regular rhythm.     Heart sounds: No murmur.  Pulmonary:     Effort: Pulmonary effort is normal. No respiratory distress.     Breath sounds: Normal breath sounds.  Abdominal:     Palpations: Abdomen is soft.     Tenderness: There is no abdominal tenderness. There is no guarding  or rebound.  Skin:    General: Skin is warm and dry.     Findings:  No rash.  Neurological:     General: No focal deficit present.     Mental Status: She is alert and oriented to person, place, and time.      UC Treatments / Results  Labs (all labs ordered are listed, but only abnormal results are displayed) Labs Reviewed - No data to display  EKG   Radiology No results found.  Procedures Procedures (including critical care time)  Medications Ordered in UC Medications - No data to display  Initial Impression / Assessment and Plan / UC Course  I have reviewed the triage vital signs and the nursing notes.  Pertinent labs & imaging results that were available during my care of the patient were reviewed by me and considered in my medical decision making (see chart for details).    Acute sinusitis.  Treating with doxycycline.  Instructed patient to stop taking the Augmentin.  Patient is well-appearing and her exam is unremarkable.  Instructed her to continue using Flonase.  Discussed that she should follow-up with an ENT if her symptoms persist.  Also discussed that she needs to establish a primary care provider and suggestion given.  Patient agrees to plan of care.     Final Clinical Impressions(s) / UC Diagnoses   Final diagnoses:  Acute non-recurrent frontal sinusitis     Discharge Instructions     Stop taking the current antibiotic Augmentin.    Take the new antibiotic doxycycline as directed.    Continue to use the Flonase.    If your symptoms are not improving, follow-up with the ENT listed below.    Establish a primary care provider such as the one listed below.          ED Prescriptions    Medication Sig Dispense Auth. Provider   doxycycline (VIBRAMYCIN) 100 MG capsule Take 1 capsule (100 mg total) by mouth 2 (two) times daily. 20 capsule Sharion Balloon, NP     PDMP not reviewed this encounter.   Sharion Balloon, NP 11/15/18 1443    Sharion Balloon, NP 11/15/18 [phone]

## 2019-03-29 ENCOUNTER — Ambulatory Visit
Admission: RE | Admit: 2019-03-29 | Discharge: 2019-03-29 | Disposition: A | Discharge status: Home / Self Care | Payer: Medicaid Other | Source: Ambulatory Visit | Attending: Obstetrics and Gynecology | Admitting: Obstetrics and Gynecology

## 2019-03-29 DIAGNOSIS — D219 Benign neoplasm of connective and other soft tissue, unspecified: Secondary | ICD-10-CM

## 2019-03-29 MED ORDER — GADOBENATE DIMEGLUMINE 529 MG/ML IV SOLN
15.0000 mL | Freq: Once | INTRAVENOUS | Status: AC | PRN
  Administered 2019-03-29: 15 mL via INTRAVENOUS

## 2019-06-12 ENCOUNTER — Observation Stay (HOSPITAL_COMMUNITY)
Admission: AD | Admit: 2019-06-12 | Discharge: 2019-06-13 | Disposition: A | Discharge status: Home / Self Care | Payer: Medicaid Other | Source: Ambulatory Visit | Attending: Obstetrics and Gynecology | Admitting: Obstetrics and Gynecology

## 2019-06-12 DIAGNOSIS — Z20822 Contact with and (suspected) exposure to covid-19: Secondary | ICD-10-CM | POA: Insufficient documentation

## 2019-06-12 DIAGNOSIS — J45909 Unspecified asthma, uncomplicated: Secondary | ICD-10-CM | POA: Insufficient documentation

## 2019-06-12 DIAGNOSIS — Z79899 Other long term (current) drug therapy: Secondary | ICD-10-CM | POA: Insufficient documentation

## 2019-06-12 DIAGNOSIS — K5730 Diverticulosis of large intestine without perforation or abscess without bleeding: Secondary | ICD-10-CM | POA: Insufficient documentation

## 2019-06-12 DIAGNOSIS — D500 Iron deficiency anemia secondary to blood loss (chronic): Principal | ICD-10-CM | POA: Insufficient documentation

## 2019-06-12 DIAGNOSIS — N920 Excessive and frequent menstruation with regular cycle: Secondary | ICD-10-CM | POA: Insufficient documentation

## 2019-06-12 DIAGNOSIS — D259 Leiomyoma of uterus, unspecified: Secondary | ICD-10-CM | POA: Insufficient documentation

## 2019-06-12 LAB — CBC
HCT: 18.2 % — ABNORMAL LOW (ref 36.0–46.0)
Hemoglobin: 4.4 g/dL — CL (ref 12.0–15.0)
MCH: 17.1 pg — ABNORMAL LOW (ref 26.0–34.0)
MCHC: 24.2 g/dL — ABNORMAL LOW (ref 30.0–36.0)
MCV: 70.8 fL — ABNORMAL LOW (ref 80.0–100.0)
Platelets: 531 10*3/uL — ABNORMAL HIGH (ref 150–400)
RBC: 2.57 MIL/uL — ABNORMAL LOW (ref 3.87–5.11)
RDW: 22.5 % — ABNORMAL HIGH (ref 11.5–15.5)
WBC: 6.1 10*3/uL (ref 4.0–10.5)
nRBC: 0.3 % — ABNORMAL HIGH (ref 0.0–0.2)

## 2019-06-12 LAB — IRON AND TIBC
Iron: 8 ug/dL — ABNORMAL LOW (ref 28–170)
Saturation Ratios: 2 % — ABNORMAL LOW (ref 10.4–31.8)
TIBC: 395 ug/dL (ref 250–450)
UIBC: 387 ug/dL

## 2019-06-12 LAB — RETICULOCYTES
Immature Retic Fract: 19.8 % — ABNORMAL HIGH (ref 2.3–15.9)
RBC.: 2.57 MIL/uL — ABNORMAL LOW (ref 3.87–5.11)
Retic Count, Absolute: 59.6 10*3/uL (ref 19.0–186.0)
Retic Ct Pct: 2.3 % (ref 0.4–3.1)

## 2019-06-12 LAB — FERRITIN: Ferritin: 2 ng/mL — ABNORMAL LOW (ref 11–307)

## 2019-06-12 LAB — ABO/RH: ABO/RH(D): A POS

## 2019-06-12 LAB — PREPARE RBC (CROSSMATCH)

## 2019-06-12 MED ORDER — ONDANSETRON HCL 4 MG/2ML IJ SOLN: 4.0000 mg | Freq: Four times a day (QID) | INTRAMUSCULAR | Status: DC | PRN

## 2019-06-12 MED ORDER — MENTHOL 3 MG MT LOZG: 1.0000 | LOZENGE | OROMUCOSAL | Status: DC | PRN

## 2019-06-12 MED ORDER — ONDANSETRON HCL 4 MG PO TABS: 4.0000 mg | ORAL_TABLET | Freq: Four times a day (QID) | ORAL | Status: DC | PRN

## 2019-06-12 MED ORDER — GUAIFENESIN 100 MG/5ML PO SOLN
15.0000 mL | ORAL | Status: DC | PRN
  Filled 2019-06-12: qty 15

## 2019-06-12 MED ORDER — LACTATED RINGERS IV SOLN: INTRAVENOUS | Status: DC

## 2019-06-12 MED ORDER — SIMETHICONE 80 MG PO CHEW: 80.0000 mg | CHEWABLE_TABLET | Freq: Four times a day (QID) | ORAL | Status: DC | PRN

## 2019-06-12 MED ORDER — DIPHENHYDRAMINE HCL 25 MG PO CAPS
25.0000 mg | ORAL_CAPSULE | Freq: Once | ORAL | Status: AC
  Administered 2019-06-12: 25 mg via ORAL
  Filled 2019-06-12: qty 1

## 2019-06-12 MED ORDER — ACETAMINOPHEN 325 MG PO TABS
650.0000 mg | ORAL_TABLET | Freq: Once | ORAL | Status: AC
  Administered 2019-06-12: 650 mg via ORAL
  Filled 2019-06-12: qty 2

## 2019-06-12 MED ORDER — SODIUM CHLORIDE 0.9% IV SOLUTION: Freq: Once | INTRAVENOUS | Status: AC

## 2019-06-12 NOTE — H&P (Addendum)
[name] is an 44 y.o. female G24P4 SF with symptomatic  Anemia presents for blood transfusion due to office CBC showed hgb 5.1. pt has known large  Fibroid uterus  With a 17 cm intracavitary fibroid) with associated menorrhagia  Pertinent Gynecological History: Menses: heavy flow Bleeding: menorrhagia Contraception: none DES exposure: denies Blood transfusions: transfusion 2017 Sexually transmitted diseases: no past history Previous GYN Procedures: DNC  Last mammogram: normal Date: 2020 Last pap: normal Date:2021 OB History: G4P4   Menstrual History: Menarche age: n/a No LMP recorded.    Past Medical History:  Diagnosis Date  . Anemia   . Asthma   . Gestational diabetes   . Uterine fibroid     Past Surgical History:  Procedure Laterality Date  . arm surgery    . CERVICAL CERCLAGE    . CESAREAN SECTION    . DILATION AND CURETTAGE OF UTERUS    . TOOTH EXTRACTION      Family History  Problem Relation Age of Onset  . Hypertension Mother   . Hypertension Brother   . Hypertension Maternal Uncle     Social History:  reports that she has never smoked. She has never used smokeless tobacco. She reports that she does not drink alcohol or use drugs.  Allergies:  Allergies  Allergen Reactions  . Shellfish Allergy Itching and Swelling    Throat swells   . Other Itching and Swelling    Hazelnut    Medications Prior to Admission  Medication Sig Dispense Refill Last Dose  . acetaminophen (TYLENOL) 500 MG tablet Take 500 mg by mouth every 6 (six) hours as needed for mild pain.      Marland Kitchen albuterol (PROVENTIL HFA;VENTOLIN HFA) 108 (90 Base) MCG/ACT inhaler Inhale 2 puffs into the lungs every 6 (six) hours as needed for wheezing or shortness of breath.     . doxycycline (VIBRAMYCIN) 100 MG capsule Take 1 capsule (100 mg total) by mouth 2 (two) times daily. 20 capsule 0   . ferrous sulfate 325 (65 FE) MG EC tablet Take 1 tablet (325 mg total) by mouth 2 (two) times daily.  3    . fluticasone (FLONASE) 50 MCG/ACT nasal spray Place 1-2 sprays into both nostrils daily for 7 days. 1 g 0   . ibuprofen (ADVIL) 600 MG tablet Take 1 tablet (600 mg total) by mouth every 6 (six) hours as needed. 30 tablet 0   . megestrol (MEGACE) 40 MG tablet Take 1 tablet (40 mg total) by mouth 2 (two) times daily. 60 tablet 3   . metroNIDAZOLE (FLAGYL) 500 MG tablet Take 1 tablet (500 mg total) by mouth 2 (two) times daily. 14 tablet 0   . ondansetron (ZOFRAN ODT) 4 MG disintegrating tablet Take 1 tablet (4 mg total) by mouth every 8 (eight) hours as needed for nausea or vomiting. 15 tablet 0   . Prenatal Multivit-Min-Fe-FA (PRENATAL VITAMINS) 0.8 MG tablet Take 1 tablet by mouth daily. 30 tablet 12     Review of Systems  Constitutional: Positive for fatigue.  Respiratory: Positive for shortness of breath.   Cardiovascular: Negative for chest pain.  Gastrointestinal: Positive for nausea.  Genitourinary: Positive for menstrual problem.    There were no vitals taken for this visit. Physical Exam  Constitutional: She is oriented to person, place, and time. She appears well-developed and well-nourished.  pale  HENT:  Head: Atraumatic.  Eyes: EOM are normal.  Pale conjunctiva  Cardiovascular: Regular rhythm.  Respiratory: Breath sounds normal.  GI:  Soft. Uterus @ umb   Genitourinary:    Genitourinary Comments: deferred   Musculoskeletal:     Cervical back: Neck supple.  Neurological: She is alert and oriented to person, place, and time.  Skin: Skin is warm and dry.    MR PELVIS W WO CONTRAST  Result Date: 03/29/2019 CLINICAL DATA:  Enlarged uterus. Fibroids. Evaluate for endometrial mass or adenomyosis. EXAM: MRI PELVIS WITHOUT AND WITH CONTRAST TECHNIQUE: Multiplanar multisequence MR imaging of the pelvis was performed both before and after administration of intravenous contrast. CONTRAST:  50mL MULTIHANCE GADOBENATE DIMEGLUMINE 529 MG/ML IV SOLN COMPARISON:  Ultrasound on  07/04/2018 FINDINGS: Lower Urinary Tract: No bladder or urethral abnormality identified. Bowel:  Sigmoid diverticulosis, without evidence of diverticulitis. Vascular/Lymphatic: No pathologically enlarged lymph nodes or other significant abnormality. Reproductive: -- Uterus: Measures 20.3 x 10.2 by 18.7 cm (volume = 2030 cm^3). Numerous fibroids are seen which involve the uterus diffusely. The largest fibroid is centrally located in the uterus and has a large intracavitary component extending from the fundus into the endocervical canal. This fibroid measures 17.4 x 6.6 by 13.0 cm. Multiple other smaller fibroids are seen which are intramural or subserosal in location, with largest of these in the left lateral corpus measuring 4.1 cm. No radiographic evidence of uterine adenomyosis. -- Intracavitary fibroids: Central uterine fibroid measuring 17.4 x 6.6 x 13.0 cm is predominantly intracavitary in location and extends into the upper endocervical canal. -- Pedunculated fibroids: None. -- Fibroid contrast enhancement: Multiple fibroids including the largest central fibroids show diffuse contrast enhancement. Several smaller fibroids show lack of contrast enhancement. -- Right ovary:  Appears normal.  No mass identified. -- Left ovary:  Appears normal.  No mass identified. Other: Trace amount of free fluid noted. Musculoskeletal:  Unremarkable. IMPRESSION: Markedly enlarged uterus with diffuse involvement by fibroids. Largest fibroid measuring 17.4 cm in the central uterus has a large intracavitary component which extends into the upper endocervical canal. Normal appearance of both ovaries.  No adnexal mass identified. Electronically Signed   By: Marlaine Hind M.D.   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 4 U PRBC & 2U FFP . Labs for iron deficiency. Will probably benefit from IV iron at the conclusion of the  transfusion. Risk of blood transfusion reviewed with pt. Pt had been counseled on her surgical options  Karim Aiello A Mekhi Sonn 06/12/2019, 8:19 PM   Addendum CBC    Component Value Date/Time   WBC 6.1 06/12/2019 2102   RBC 2.57 (L) 06/12/2019 2102   RBC 2.57 (L) 06/12/2019 2102   HGB 4.4 (LL) 06/12/2019 2102   HGB 9.2 (L) 05/27/2015 1441   HCT 18.2 (L) 06/12/2019 2102   HCT 32.5 (L) 05/27/2015 1441   PLT 531 (H) 06/12/2019 2102   PLT 349 05/27/2015 1441   MCV 70.8 (L) 06/12/2019 2102   MCV 75.8 (L) 05/27/2015 1441   MCH 17.1 (L) 06/12/2019 2102   MCHC 24.2 (L) 06/12/2019 2102   RDW 22.5 (H) 06/12/2019 2102   RDW 32.1 (H) 05/27/2015 1441   LYMPHSABS 1.3 12/30/2015 2305   LYMPHSABS 1.5 05/27/2015 1441   MONOABS 0.6 12/30/2015 2305   MONOABS 0.2 05/27/2015 1441   EOSABS 0.0 12/30/2015 2305   EOSABS 0.2 05/27/2015 1441   BASOSABS 0.0 12/30/2015 2305   BASOSABS 0.1 05/27/2015 1441

## 2019-06-13 LAB — SARS CORONAVIRUS 2 (TAT 6-24 HRS): SARS Coronavirus 2: NEGATIVE

## 2019-06-13 LAB — CBC
HCT: 33 % — ABNORMAL LOW (ref 36.0–46.0)
Hemoglobin: 9.9 g/dL — ABNORMAL LOW (ref 12.0–15.0)
MCH: 23.9 pg — ABNORMAL LOW (ref 26.0–34.0)
MCHC: 30 g/dL (ref 30.0–36.0)
MCV: 79.5 fL — ABNORMAL LOW (ref 80.0–100.0)
Platelets: 494 10*3/uL — ABNORMAL HIGH (ref 150–400)
RBC: 4.15 MIL/uL (ref 3.87–5.11)
RDW: 22.4 % — ABNORMAL HIGH (ref 11.5–15.5)
WBC: 8.6 10*3/uL (ref 4.0–10.5)
nRBC: 0.3 % — ABNORMAL HIGH (ref 0.0–0.2)

## 2019-06-13 LAB — HCG, QUANTITATIVE, PREGNANCY: hCG, Beta Chain, Quant, S: <1 m[IU]/mL (ref <5)

## 2019-06-13 MED ORDER — SODIUM CHLORIDE 0.9 % IV SOLN
510.0000 mg | INTRAVENOUS | Status: DC
  Administered 2019-06-13: 510 mg via INTRAVENOUS
  Filled 2019-06-13: qty 17

## 2019-06-13 MED ORDER — IRON POLYSACCH CMPLX-B12-FA 150-0.025-1 MG PO CAPS: 1.0000 | ORAL_CAPSULE | Freq: Every day | ORAL | 11 refills | Status: DC

## 2019-06-13 NOTE — Progress Notes (Signed)
HD # 2  S: feels better but fatigue. Notes legs were tired Still on cycle O: BP 134/86 (BP Location: Right Arm)   Pulse 73   Temp 99.5 F (37.5 C) (Oral)   Resp 18   Ht 5\' 4"  (1.626 m)   Wt 73.5 kg   SpO2 100%   BMI 27.82 kg/m  Cor RRR Lungs clear to A Abd: uterus 2FB above umb Extr no edema  CBC Latest Ref Rng & Units 06/13/2019 06/12/2019 01/01/2016  WBC 4.0 - 10.5 K/uL 8.6 6.1 8.1  Hemoglobin 12.0 - 15.0 g/dL 9.9(L) 4.4(LL) 8.3(L)  Hematocrit 36.0 - 46.0 % 33.0(L) 18.2(L) 28.1(L)  Platelets 150 - 400 K/uL 494(H) 531(H) 279  IMP: symptomatic uterine fibroid Improved severe iron deficiency anemia due to chronic blood loss Disc with pt and her daughter again regarding need to make a surgical decision regarding her fibroid. Reviewed this is not the first time she has had to be transfused. She will need to make decision regarding future fertility . Recommend hysterectomy as discussed in the office yesterday if complete family. Not a candidate for [place] P) d/c home. Cont with iron supplement. Repeat 2nd IV iron infusion next wk To call with decision regarding surgery Will pick up samples of iron at the office today

## 2019-06-13 NOTE — Discharge Instructions (Signed)
Pick up iron tablets at office

## 2019-06-13 NOTE — Progress Notes (Signed)
Patient discharged to home with instructions. 

## 2019-06-13 NOTE — Discharge Summary (Signed)
Physician Discharge Summary  Patient ID: [name] MRN: [id number] DOB/AGE: [birth year] 46 y.o.  Admit date: 06/12/2019 Discharge date: 06/13/2019  Admission Diagnoses: iron deficiency anemia due to chronic blood loss, symptomatic uterine fibroid  Discharge Diagnoses: same Principal Problem:   Iron deficiency anemia due to chronic blood loss Active Problems:   Menorrhagia with regular cycle   Discharged Condition: stable  Hospital Course: pt was placed under observation. She was transfused with 4 units of PRBC, 2u FFP and IV iron infusion  Consults: None  Significant Diagnostic Studies: labs:  CBC Latest Ref Rng & Units 06/13/2019 06/12/2019 01/01/2016  WBC 4.0 - 10.5 K/uL 8.6 6.1 8.1  Hemoglobin 12.0 - 15.0 g/dL 9.9(L) 4.4(LL) 8.3(L)  Hematocrit 36.0 - 46.0 % 33.0(L) 18.2(L) 28.1(L)  Platelets 150 - 400 K/uL 494(H) 531(H) 279     Treatments: IV iron, Blood transfusion, FFP  Discharge Exam: Blood pressure 134/86, pulse 73, temperature 99.5 F (37.5 C), temperature source Oral, resp. rate 18, height 5\' 4"  (1.626 m), weight 73.5 kg, SpO2 100 %. General appearance: alert, cooperative, and no distress Resp: clear to auscultation bilaterally Cardio: regular rate and rhythm, S1, S2 normal, no murmur, click, rub or gallop GI: uterus 2FB above umb, nontender, soft Pelvic: deferred Extremities: no edema, redness or tenderness in the calves or thighs  Disposition: Discharge disposition: 01-Home or Self Care       Discharge Instructions     Activity as tolerated - No restrictions   Complete by: As directed    Call MD for:  difficulty breathing, headache or visual disturbances   Complete by: As directed    Call MD for:  temperature >100.4   Complete by: As directed    Diet general   Complete by: As directed       Allergies as of 06/13/2019       Reactions   Shellfish Allergy Itching, Swelling   Throat swells    Other Itching, Swelling   Hazelnut         Medication List     STOP taking these medications    doxycycline 100 MG capsule Commonly known as: VIBRAMYCIN   ferrous sulfate 325 (65 FE) MG EC tablet   ibuprofen 600 MG tablet Commonly known as: ADVIL   megestrol 40 MG tablet Commonly known as: MEGACE   metroNIDAZOLE 500 MG tablet Commonly known as: FLAGYL   ondansetron 4 MG disintegrating tablet Commonly known as: Zofran ODT   Prenatal Vitamins 0.8 MG tablet       TAKE these medications    acetaminophen 500 MG tablet Commonly known as: TYLENOL Take 500 mg by mouth every 6 (six) hours as needed for mild pain.   albuterol 108 (90 Base) MCG/ACT inhaler Commonly known as: VENTOLIN HFA Inhale 2 puffs into the lungs every 6 (six) hours as needed for wheezing or shortness of breath.   fluticasone 50 MCG/ACT nasal spray Commonly known as: FLONASE Place 1-2 sprays into both nostrils daily for 7 days.   Iron Polysacch Cmplx-B12-FA 150-0.025-1 MG Caps Take 1 capsule by mouth daily.       Follow-up Information     Servando Salina, MD Follow up in 2 week(s).   Specialty: Obstetrics and Gynecology Contact information: [address] [place] [address] [phone]            Signed: Alanda Slim A Lillien Petronio 06/13/2019, 3:53 PM

## 2019-06-14 ENCOUNTER — Other Ambulatory Visit: Payer: Self-pay | Admitting: Obstetrics and Gynecology

## 2019-06-14 LAB — TYPE AND SCREEN
ABO/RH(D): A POS
Antibody Screen: NEGATIVE
Unit division: 0
Unit division: 0
Unit division: 0
Unit division: 0

## 2019-06-14 LAB — BPAM RBC
Blood Product Expiration Date: 202106052359
Blood Product Expiration Date: 202106052359
Blood Product Expiration Date: 202106052359
Blood Product Expiration Date: 202106062359
ISSUE DATE / TIME: 202105102250
ISSUE DATE / TIME: 202105110150
ISSUE DATE / TIME: 202105110447
ISSUE DATE / TIME: 202105110939
Unit Type and Rh: 6200
Unit Type and Rh: 6200
Unit Type and Rh: 6200
Unit Type and Rh: 6200

## 2019-06-14 LAB — BPAM FFP
Blood Product Expiration Date: 202105112359
Blood Product Expiration Date: 202105152359
ISSUE DATE / TIME: 202105102251
ISSUE DATE / TIME: 202105110150
Unit Type and Rh: 6200
Unit Type and Rh: 6200

## 2019-06-14 LAB — PREPARE FRESH FROZEN PLASMA
Unit division: 0
Unit division: 0

## 2019-06-20 ENCOUNTER — Ambulatory Visit (HOSPITAL_COMMUNITY)
Admission: RE | Admit: 2019-06-20 | Discharge: 2019-06-20 | Disposition: A | Discharge status: Home / Self Care | Payer: Medicaid Other | Source: Ambulatory Visit | Attending: Obstetrics and Gynecology | Admitting: Obstetrics and Gynecology

## 2019-06-20 DIAGNOSIS — D500 Iron deficiency anemia secondary to blood loss (chronic): Secondary | ICD-10-CM | POA: Insufficient documentation

## 2019-06-20 MED ORDER — SODIUM CHLORIDE 0.9 % IV SOLN
510.0000 mg | Freq: Once | INTRAVENOUS | Status: AC
  Administered 2019-06-20: 510 mg via INTRAVENOUS
  Filled 2019-06-20: qty 17

## 2019-10-17 ENCOUNTER — Other Ambulatory Visit: Payer: Self-pay | Admitting: Obstetrics and Gynecology

## 2019-10-24 ENCOUNTER — Other Ambulatory Visit (HOSPITAL_COMMUNITY): Payer: Self-pay | Admitting: *Deleted

## 2019-10-24 NOTE — Discharge Instructions (Signed)

## 2019-10-25 ENCOUNTER — Other Ambulatory Visit: Payer: Self-pay

## 2019-10-25 ENCOUNTER — Encounter (HOSPITAL_COMMUNITY)
Admission: RE | Admit: 2019-10-25 | Discharge: 2019-10-25 | Disposition: A | Discharge status: Home / Self Care | Payer: 59 | Source: Ambulatory Visit | Attending: Obstetrics and Gynecology | Admitting: Obstetrics and Gynecology

## 2019-10-25 DIAGNOSIS — D649 Anemia, unspecified: Secondary | ICD-10-CM | POA: Insufficient documentation

## 2019-10-25 MED ORDER — SODIUM CHLORIDE 0.9 % IV SOLN
510.0000 mg | INTRAVENOUS | Status: DC
  Administered 2019-10-25: 510 mg via INTRAVENOUS
  Filled 2019-10-25: qty 17

## 2019-11-01 ENCOUNTER — Other Ambulatory Visit: Payer: Self-pay

## 2019-11-01 ENCOUNTER — Encounter (HOSPITAL_COMMUNITY)
Admission: RE | Admit: 2019-11-01 | Discharge: 2019-11-01 | Disposition: A | Discharge status: Home / Self Care | Payer: 59 | Source: Ambulatory Visit | Attending: Obstetrics and Gynecology | Admitting: Obstetrics and Gynecology

## 2019-11-01 DIAGNOSIS — D649 Anemia, unspecified: Secondary | ICD-10-CM | POA: Diagnosis not present

## 2019-11-01 MED ORDER — SODIUM CHLORIDE 0.9 % IV SOLN
510.0000 mg | INTRAVENOUS | Status: AC
  Administered 2019-11-01: 510 mg via INTRAVENOUS
  Filled 2019-11-01: qty 17

## 2020-04-17 ENCOUNTER — Ambulatory Visit
Admission: EM | Admit: 2020-04-17 | Discharge: 2020-04-17 | Disposition: A | Discharge status: Home / Self Care | Payer: 59 | Attending: Family Medicine | Admitting: Family Medicine

## 2020-04-17 ENCOUNTER — Other Ambulatory Visit: Payer: Self-pay

## 2020-04-17 DIAGNOSIS — J3089 Other allergic rhinitis: Secondary | ICD-10-CM | POA: Diagnosis not present

## 2020-04-17 DIAGNOSIS — M25511 Pain in right shoulder: Secondary | ICD-10-CM | POA: Diagnosis not present

## 2020-04-17 DIAGNOSIS — R600 Localized edema: Secondary | ICD-10-CM | POA: Diagnosis not present

## 2020-04-17 DIAGNOSIS — H6983 Other specified disorders of Eustachian tube, bilateral: Secondary | ICD-10-CM | POA: Diagnosis not present

## 2020-04-17 MED ORDER — FLUTICASONE PROPIONATE 50 MCG/ACT NA SUSP
1.0000 | Freq: Two times a day (BID) | NASAL | 2 refills | Status: DC
Start: 2020-04-17 — End: 2021-07-04

## 2020-04-17 MED ORDER — CETIRIZINE HCL 10 MG PO TABS: 10.0000 mg | ORAL_TABLET | Freq: Every day | ORAL | 2 refills | Status: DC

## 2020-04-17 MED ORDER — PREDNISONE 50 MG PO TABS
ORAL_TABLET | ORAL | 0 refills | Status: DC
Start: 2020-04-17 — End: 2020-08-13

## 2020-04-17 NOTE — ED Provider Notes (Signed)
[facility]    CSN: [id number] Arrival date & time: 04/17/20  1304      History   Chief Complaint Chief Complaint  Patient presents with  . Otalgia    HPI [name] is a 45 y.o. female.   Patient presenting today with 1 week history of bilateral ear pain, pressure, popping, crackling as well as rhinorrhea, sinus pressure, itchy eyes, sneezing.  She has known history of seasonal allergies but is not on any regimen for this.  Has not tried anything over-the-counter for this.  Denies fevers, chills, chest pain, shortness of breath, cough, sick contacts. She also is concerned about some right shoulder soreness that radiates from scapular region to her neck and down to pectoral muscle.  Worse with waking up in the morning so she thinks that her mattress and pillow need to be changed.  Has tried heat and massage with minimal relief. She is also had chronic bilateral lower leg swelling for which she used to wear compression stockings at work when standing for long periods of time but has not for some time now.  Denies redness, pain, point tenderness, heat, fevers.  No chest pain, shortness of breath, palpitations, hemoptysis.  No history of DVT.     Past Medical History:  Diagnosis Date  . Anemia   . Asthma   . Gestational diabetes   . Uterine fibroid     Patient Active Problem List   Diagnosis Date Noted  . Iron deficiency anemia due to chronic blood loss 06/12/2019  . Menorrhagia with regular cycle 06/12/2019  . Sepsis ([place]) 12/30/2015  . Fibroid uterus 12/30/2015    Past Surgical History:  Procedure Laterality Date  . arm surgery    . CERVICAL CERCLAGE    . CESAREAN SECTION    . DILATION AND CURETTAGE OF UTERUS    . TOOTH EXTRACTION      OB History    Gravida  4   Para  3   Term  2   Preterm  1   AB  1   Living  2     SAB  1   IAB      Ectopic      Multiple  1   Live Births  3            Home Medications    Prior to  Admission medications   Medication Sig Start Date End Date Taking? Authorizing Provider  cetirizine (ZYRTEC ALLERGY) 10 MG tablet Take 1 tablet (10 mg total) by mouth daily. 04/17/20  Yes Volney American, PA-C  fluticasone [facility]) 50 MCG/ACT nasal spray Place 1 spray into both nostrils in the morning and at bedtime. 04/17/20  Yes Volney American, PA-C  predniSONE (DELTASONE) 50 MG tablet Take 1 tab daily with breakfast 04/17/20  Yes Volney American, PA-C  acetaminophen (TYLENOL) 500 MG tablet Take 500 mg by mouth every 6 (six) hours as needed for mild pain.     [provider]  albuterol (PROVENTIL HFA;VENTOLIN HFA) 108 (90 Base) MCG/ACT inhaler Inhale 2 puffs into the lungs every 6 (six) hours as needed for wheezing or shortness of breath.    [provider]  fluticasone (FLONASE) 50 MCG/ACT nasal spray Place 1-2 sprays into both nostrils daily for 7 days. 09/14/18 09/21/18  Wieters, Hallie C, PA-C  Iron Polysacch Cmplx-B12-FA 150-0.025-1 MG CAPS Take 1 capsule by mouth daily. 06/13/19   Servando Salina, MD    Family History Family  History  Problem Relation Age of Onset  . Hypertension Mother   . Hypertension Brother   . Hypertension Maternal Uncle     Social History Social History   Tobacco Use  . Smoking status: Never Smoker  . Smokeless tobacco: Never Used  Substance Use Topics  . Alcohol use: No  . Drug use: No     Allergies   Shellfish allergy and Other   Review of Systems Review of Systems Per HPI  Physical Exam Triage Vital Signs ED Triage Vitals  Enc Vitals Group     BP 04/17/20 1426 129/83     Pulse Rate 04/17/20 1426 74     Resp 04/17/20 1426 20     Temp 04/17/20 1426 98.8 F (37.1 C)     Temp src --      SpO2 04/17/20 1426 98 %     Weight --      Height --      Head Circumference --      Peak Flow --      Pain Score 04/17/20 1424 9     Pain Loc --      Pain Edu? --      Excl. in [place]? --    No data  found.  Updated Vital Signs BP 129/83   Pulse 74   Temp 98.8 F (37.1 C)   Resp 20   LMP 04/07/2020 (Approximate)   SpO2 98%   Visual Acuity Right Eye Distance:   Left Eye Distance:   Bilateral Distance:    Right Eye Near:   Left Eye Near:    Bilateral Near:     Physical Exam Vitals and nursing note reviewed.  Constitutional:      Appearance: Normal appearance. She is not ill-appearing.  HENT:     Head: Atraumatic.     Ears:     Comments: Bilateral middle ear effusion    Nose: Rhinorrhea present.     Mouth/Throat:     Mouth: Mucous membranes are moist.     Pharynx: Oropharynx is clear. Posterior oropharyngeal erythema present.  Eyes:     Extraocular Movements: Extraocular movements intact.     Comments: Conjunctiva mildly erythematous diffusely  Cardiovascular:     Rate and Rhythm: Normal rate and regular rhythm.     Heart sounds: Normal heart sounds.  Pulmonary:     Effort: Pulmonary effort is normal.     Breath sounds: Normal breath sounds.  Musculoskeletal:        General: Tenderness present. Normal range of motion.     Cervical back: Normal range of motion and neck supple.     Right lower leg: No edema.     Left lower leg: No edema.     Comments: Mild tenderness to palpation right trapezius  Skin:    General: Skin is warm and dry.     Findings: No erythema.  Neurological:     Mental Status: She is alert and oriented to person, place, and time.  Psychiatric:        Mood and Affect: Mood normal.        Thought Content: Thought content normal.        Judgment: Judgment normal.      UC Treatments / Results  Labs (all labs ordered are listed, but only abnormal results are displayed) Labs Reviewed - No data to display  EKG   Radiology No results found.  Procedures Procedures (including critical care time)  Medications Ordered in UC  Medications - No data to display  Initial Impression / Assessment and Plan / UC Course  I have reviewed the  triage vital signs and the nursing notes.  Pertinent labs & imaging results that were available during my care of the patient were reviewed by me and considered in my medical decision making (see chart for details).     Patient here with numerous concerns.  She is working on getting a primary care and knows that she needs to follow-up for many of these things with them for recheck.  We will treat her allergy exacerbation with short burst of prednisone, and start Flonase and Zyrtec regimen daily for allergy control.  Discussed heat, changing her pillow, massage, stretches for her neck and shoulder soreness and compression stockings daily while at work for her bilateral lower leg edema.  Reduce sodium diet, increased water intake, exercise also reviewed.  Follow-up with PCP for recheck. Final Clinical Impressions(s) / UC Diagnoses   Final diagnoses:  Dysfunction of both eustachian tubes  Seasonal allergic rhinitis due to other allergic trigger  Lower leg edema  Acute pain of right shoulder     Discharge Instructions     Follow-up with primary care in the next few weeks for recheck of all of the things we talked about today    ED Prescriptions    Medication Sig Dispense Auth. Provider   predniSONE (DELTASONE) 50 MG tablet Take 1 tab daily with breakfast 3 tablet Volney American, PA-C   cetirizine (ZYRTEC ALLERGY) 10 MG tablet Take 1 tablet (10 mg total) by mouth daily. 30 tablet Volney American, PA-C   fluticasone [facility]) 50 MCG/ACT nasal spray Place 1 spray into both nostrils in the morning and at bedtime. 16 g Volney American, [place]     PDMP not reviewed this encounter.   Volney American, [place] 04/17/20 1524

## 2020-04-17 NOTE — ED Triage Notes (Signed)
Pt presents with c/o left ear pain  For past week

## 2020-04-17 NOTE — Discharge Instructions (Signed)
Follow-up with primary care in the next few weeks for recheck of all of the things we talked about today

## 2020-05-31 ENCOUNTER — Other Ambulatory Visit: Payer: Self-pay | Admitting: Family

## 2020-05-31 DIAGNOSIS — Z1231 Encounter for screening mammogram for malignant neoplasm of breast: Secondary | ICD-10-CM

## 2020-06-13 ENCOUNTER — Encounter: Payer: Self-pay | Admitting: Emergency Medicine

## 2020-06-13 ENCOUNTER — Other Ambulatory Visit: Payer: Self-pay

## 2020-06-13 ENCOUNTER — Ambulatory Visit
Admission: EM | Admit: 2020-06-13 | Discharge: 2020-06-13 | Disposition: A | Discharge status: Home / Self Care | Payer: 59 | Attending: Family Medicine | Admitting: Family Medicine

## 2020-06-13 DIAGNOSIS — K0889 Other specified disorders of teeth and supporting structures: Secondary | ICD-10-CM

## 2020-06-13 MED ORDER — AMOXICILLIN-POT CLAVULANATE 875-125 MG PO TABS
1.0000 | ORAL_TABLET | Freq: Two times a day (BID) | ORAL | 0 refills | Status: DC
Start: 2020-06-13 — End: 2020-08-13

## 2020-06-13 MED ORDER — HYDROCODONE-ACETAMINOPHEN 5-325 MG PO TABS: 1.0000 | ORAL_TABLET | Freq: Four times a day (QID) | ORAL | 0 refills | Status: DC | PRN

## 2020-06-13 NOTE — ED Provider Notes (Signed)
[place]   [id number] 06/13/20 Arrival Time: 2423  ASSESSMENT & PLAN:  1. Pain, dental    No sign of abscess requiring I&D at this time. Discussed.  Meds ordered this encounter  Medications  . amoxicillin-clavulanate (AUGMENTIN) 875-125 MG tablet    Sig: Take 1 tablet by mouth every 12 (twelve) hours.    Dispense:  20 tablet    Refill:  0  . HYDROcodone-acetaminophen (NORCO/VICODIN) 5-325 MG tablet    Sig: Take 1 tablet by mouth every 6 (six) hours as needed for moderate pain or severe pain.    Dispense:  8 tablet    Refill:  0    [place] Controlled Substances Registry consulted for this patient. I feel the risk/benefit ratio today is favorable for proceeding with this prescription for a controlled substance. Medication sedation precautions given.  Dental resource written instructions given. She will schedule dental evaluation as soon as possible if not improving over the next 24-48 hours.  Reviewed expectations re: course of current medical issues. Questions answered. Outlined signs and symptoms indicating need for more acute intervention. Patient verbalized understanding. After Visit Summary given.   SUBJECTIVE:  [name] is a 45 y.o. female who reports gradual onset of left upper dental pain described as aching/throbbing. Present for 1-1.5 weeks. Fever: absent. Tolerating PO intake but reports pain with chewing. Normal swallowing. She does not see a dentist regularly. No neck swelling or pain. OTC analgesics without relief.   OBJECTIVE: Vitals:   06/13/20 1011  BP: 127/67  Pulse: 91  Resp: 18  Temp: 98.4 F (36.9 C)  TempSrc: Oral  SpO2: 98%    General appearance: alert; no distress HENT: normocephalic; atraumatic; dentition: fair; left upper gums without areas of fluctuance, drainage, or bleeding and with reported pain; normal jaw movement without difficulty Neck: supple without LAD; FROM; trachea midline Lungs: normal respirations; unlabored;  speaks full sentences without difficulty Skin: warm and dry Psychological: alert and cooperative; normal mood and affect  Allergies  Allergen Reactions  . Shellfish Allergy Itching and Swelling    Throat swells   . Other Itching and Swelling    [place]    Past Medical History:  Diagnosis Date  . Anemia   . Asthma   . Gestational diabetes   . Uterine fibroid    Social History   Socioeconomic History  . Marital status: Single    Spouse name: Not on file  . Number of children: Not on file  . Years of education: Not on file  . Highest education level: Not on file  Occupational History  . Not on file  Tobacco Use  . Smoking status: Never Smoker  . Smokeless tobacco: Never Used  Substance and Sexual Activity  . Alcohol use: No  . Drug use: No  . Sexual activity: Yes    Birth control/protection: Condom  Other Topics Concern  . Not on file  Social History Narrative  . Not on file   Social Determinants of Health   Financial Resource Strain: Not on file  Food Insecurity: Not on file  Transportation Needs: Not on file  Physical Activity: Not on file  Stress: Not on file  Social Connections: Not on file  Intimate Partner Violence: Not on file   Family History  Problem Relation Age of Onset  . Hypertension Mother   . Hypertension Brother   . Hypertension Maternal Uncle    Past Surgical History:  Procedure Laterality Date  . arm surgery    .  CERVICAL CERCLAGE    . CESAREAN SECTION    . DILATION AND CURETTAGE OF UTERUS    . TOOTH EXTRACTION       Vanessa Kick, MD 06/13/20 1023

## 2020-06-13 NOTE — ED Triage Notes (Signed)
Pt here for left sided dental and ear pain x 3 days

## 2020-06-13 NOTE — Discharge Instructions (Addendum)

## 2020-06-26 ENCOUNTER — Other Ambulatory Visit: Payer: Self-pay

## 2020-06-26 ENCOUNTER — Emergency Department (HOSPITAL_BASED_OUTPATIENT_CLINIC_OR_DEPARTMENT_OTHER)
Admission: EM | Admit: 2020-06-26 | Discharge: 2020-06-26 | Disposition: A | Discharge status: Home / Self Care | Payer: 59 | Attending: Emergency Medicine | Admitting: Emergency Medicine

## 2020-06-26 DIAGNOSIS — R000 Tachycardia, unspecified: Secondary | ICD-10-CM | POA: Insufficient documentation

## 2020-06-26 DIAGNOSIS — D500 Iron deficiency anemia secondary to blood loss (chronic): Secondary | ICD-10-CM | POA: Diagnosis not present

## 2020-06-26 DIAGNOSIS — J45909 Unspecified asthma, uncomplicated: Secondary | ICD-10-CM | POA: Insufficient documentation

## 2020-06-26 DIAGNOSIS — D649 Anemia, unspecified: Secondary | ICD-10-CM | POA: Diagnosis present

## 2020-06-26 LAB — CBC WITH DIFFERENTIAL/PLATELET
Abs Immature Granulocytes: 0 10*3/uL (ref 0.00–0.07)
Basophils Absolute: 0.1 10*3/uL (ref 0.0–0.1)
Basophils Relative: 2 %
Eosinophils Absolute: 0.1 10*3/uL (ref 0.0–0.5)
Eosinophils Relative: 4 %
HCT: 24.9 % — ABNORMAL LOW (ref 36.0–46.0)
Hemoglobin: 6.3 g/dL — CL (ref 12.0–15.0)
Immature Granulocytes: 0 %
Lymphocytes Relative: 46 %
Lymphs Abs: 1.1 10*3/uL (ref 0.7–4.0)
MCH: 17.1 pg — ABNORMAL LOW (ref 26.0–34.0)
MCHC: 25.3 g/dL — ABNORMAL LOW (ref 30.0–36.0)
MCV: 67.7 fL — ABNORMAL LOW (ref 80.0–100.0)
Monocytes Absolute: 0.2 10*3/uL (ref 0.1–1.0)
Monocytes Relative: 7 %
Neutro Abs: 1 10*3/uL — ABNORMAL LOW (ref 1.7–7.7)
Neutrophils Relative %: 41 %
Platelets: 161 10*3/uL (ref 150–400)
RBC: 3.68 MIL/uL — ABNORMAL LOW (ref 3.87–5.11)
RDW: 20.6 % — ABNORMAL HIGH (ref 11.5–15.5)
Smear Review: NORMAL
WBC: 2.5 10*3/uL — ABNORMAL LOW (ref 4.0–10.5)
nRBC: 0 % (ref 0.0–0.2)

## 2020-06-26 LAB — BASIC METABOLIC PANEL
Anion gap: 8 (ref 5–15)
BUN: 9 mg/dL (ref 6–20)
CO2: 24 mmol/L (ref 22–32)
Calcium: 8.7 mg/dL — ABNORMAL LOW (ref 8.9–10.3)
Chloride: 107 mmol/L (ref 98–111)
Creatinine, Ser: 0.61 mg/dL (ref 0.44–1.00)
GFR, Estimated: >60 mL/min (ref >60)
Glucose, Bld: 103 mg/dL — ABNORMAL HIGH (ref 70–99)
Potassium: 3.8 mmol/L (ref 3.5–5.1)
Sodium: 139 mmol/L (ref 135–145)

## 2020-06-26 LAB — PREPARE RBC (CROSSMATCH)

## 2020-06-26 MED ORDER — MEGESTROL ACETATE 40 MG PO TABS: 40.0000 mg | ORAL_TABLET | Freq: Two times a day (BID) | ORAL | 0 refills | Status: AC

## 2020-06-26 MED ORDER — SODIUM CHLORIDE 0.9 % IV SOLN
10.0000 mL/h | Freq: Once | INTRAVENOUS | Status: AC
  Administered 2020-06-26: 10 mL/h via INTRAVENOUS

## 2020-06-26 NOTE — ED Provider Notes (Signed)
[place] EMERGENCY DEPT Provider Note   CSN: [id number] Arrival date & time: 06/26/20  4098     History CC:  Anemia  [name] is a 45 y.o. female with a history of iron deficiency anemia, menorrhagia [place], uterine fibroids, presented ED with concern for anemia.  The patient says her doctor called her on Friday and told her to come to the nearest ER because she was anemic.  She does not recall lower hemoglobin once.  She reports she is not actively on her menstrual cycle.  However she does continue to have menstrual cycles once a month, bleeds very heavily for about 5 days during these.  She is not currently taking iron now, although she has in the past.  She is also needed iron transfusions and blood transfusions in the past.  She does not currently have an OB/GYN provider.  She does not take any kind of birth control or medications to alleviate her menstrual cycles.  She denies any new symptoms for the past several days.  She reports she has chronic fatigue and dyspnea.  She has chronic lightheadedness.  Nothing new about any of these recently.  HPI     Past Medical History:  Diagnosis Date  . Anemia   . Asthma   . Gestational diabetes   . Uterine fibroid     Patient Active Problem List   Diagnosis Date Noted  . Iron deficiency anemia due to chronic blood loss 06/12/2019  . Menorrhagia with regular cycle 06/12/2019  . Sepsis ([place]) 12/30/2015  . Fibroid uterus 12/30/2015    Past Surgical History:  Procedure Laterality Date  . arm surgery    . CERVICAL CERCLAGE    . CESAREAN SECTION    . DILATION AND CURETTAGE OF UTERUS    . TOOTH EXTRACTION       OB History    Gravida  4   Para  3   Term  2   Preterm  1   AB  1   Living  2     SAB  1   IAB      Ectopic      Multiple  1   Live Births  3           Family History  Problem Relation Age of Onset  . Hypertension Mother   . Hypertension Brother   . Hypertension Maternal  Uncle     Social History   Tobacco Use  . Smoking status: Never Smoker  . Smokeless tobacco: Never Used  Substance Use Topics  . Alcohol use: No  . Drug use: No    Home Medications Prior to Admission medications   Medication Sig Start Date End Date Taking? Authorizing Provider  albuterol (PROVENTIL HFA;VENTOLIN HFA) 108 (90 Base) MCG/ACT inhaler Inhale 2 puffs into the lungs every 6 (six) hours as needed for wheezing or shortness of breath.    [provider]  amoxicillin-clavulanate (AUGMENTIN) 875-125 MG tablet Take 1 tablet by mouth every 12 (twelve) hours. 06/13/20   Vanessa Kick, MD  cetirizine (ZYRTEC ALLERGY) 10 MG tablet Take 1 tablet (10 mg total) by mouth daily. 04/17/20   Volney American, PA-C  fluticasone [facility]) 50 MCG/ACT nasal spray Place 1-2 sprays into both nostrils daily for 7 days. 09/14/18 09/21/18  Wieters, Hallie C, PA-C  fluticasone (FLONASE) 50 MCG/ACT nasal spray Place 1 spray into both nostrils in the morning and at bedtime. 04/17/20   Volney American, PA-C  HYDROcodone-acetaminophen (  NORCO/VICODIN) 5-325 MG tablet Take 1 tablet by mouth every 6 (six) hours as needed for moderate pain or severe pain. 06/13/20   Vanessa Kick, MD  Iron Polysacch Cmplx-B12-FA 150-0.025-1 MG CAPS Take 1 capsule by mouth daily. 06/13/19   Servando Salina, MD  predniSONE (DELTASONE) 50 MG tablet Take 1 tab daily with breakfast Patient not taking: Reported on 06/13/2020 04/17/20   Volney American, PA-C    Allergies    Shellfish allergy and Other  Review of Systems   Review of Systems  Constitutional: Negative for chills and fever.  Eyes: Negative for pain and visual disturbance.  Respiratory: Positive for shortness of breath. Negative for cough.   Cardiovascular: Negative for chest pain and palpitations.  Gastrointestinal: Negative for abdominal pain and vomiting.  Genitourinary: Negative for dysuria and hematuria.  Musculoskeletal: Negative for  arthralgias, back pain and myalgias.  Skin: Negative for color change and rash.  Neurological: Positive for light-headedness and numbness. Negative for syncope.  All other systems reviewed and are negative.   Physical Exam Updated Vital Signs BP 131/77   Pulse 80   Temp 99.8 F (37.7 C) (Oral)   Resp 16   Ht 5\' 3"  (1.6 m)   Wt 81.6 kg   SpO2 100%   BMI 31.89 kg/m   Physical Exam Constitutional:      General: She is not in acute distress. HENT:     Head: Normocephalic and atraumatic.     Comments: Conjunctival pallor Eyes:     Conjunctiva/sclera: Conjunctivae normal.     Pupils: Pupils are equal, round, and reactive to light.  Cardiovascular:     Rate and Rhythm: Regular rhythm. Tachycardia present.     Comments: HR 102 Pulmonary:     Effort: Pulmonary effort is normal. No respiratory distress.  Abdominal:     General: There is no distension.     Tenderness: There is no abdominal tenderness.  Skin:    General: Skin is warm and dry.  Neurological:     General: No focal deficit present.     Mental Status: She is alert. Mental status is at baseline.  Psychiatric:        Mood and Affect: Mood normal.        Behavior: Behavior normal.     ED Results / Procedures / Treatments   Labs (all labs ordered are listed, but only abnormal results are displayed) Labs Reviewed  CBC WITH DIFFERENTIAL/PLATELET - Abnormal; Notable for the following components:      Result Value   WBC 2.5 (*)    RBC 3.68 (*)    Hemoglobin 6.3 (*)    HCT 24.9 (*)    MCV 67.7 (*)    MCH 17.1 (*)    MCHC 25.3 (*)    RDW 20.6 (*)    Neutro Abs 1.0 (*)    All other components within normal limits  BASIC METABOLIC PANEL - Abnormal; Notable for the following components:   Glucose, Bld 103 (*)    Calcium 8.7 (*)    All other components within normal limits    EKG None  Radiology No results found.  Procedures Procedures   Medications Ordered in ED Medications - No data to display  ED  Course  I have reviewed the triage vital signs and the nursing notes.  Pertinent labs & imaging results that were available during my care of the patient were reviewed by me and considered in my medical decision making (see chart for details).  Patient presents to the ED with suspected iron deficiency anemia, hemoglobin noted to be 6.3 today.  This is a chronic issue for her, very likely related to heavy menstrual cycles.  She is not actively on her cycle, and is hemodynamically stable. I do not believe there is an acute bleeding risk.  In the past hemoglobin has been the lowest of 4.3.  She has required blood transfusions and iron transfusions.  She is currently not receiving any iron at home.  I reviewed her labs and test today. Her daughter was present at the bedside and will transport her to [facility]. I discussed with the patient need for OB/GYN follow-up after her infusion today.  The patient will need a type and screen drawn when she arrives at [facility].  I was informed that we are not able to draw or process a type and screen at [facility].  The patient will need prescriptions for iron tablets and Colace sent to her pharmacy at the time of discharge.  I was unable to send the prescriptions due to an Epic error while at [facility] ED.  Clinical Course as of 06/26/20 1104  Wed Jun 26, 2020  1051 I reassessed the patient.  She remains hemodynamically stable.  Her hemoglobin is 6.3, although I suspect this is largely chronic.  Her vital signs are stable.  Her daughter is now present at bedside.  I do believe that she is stable to go by private vehicle directly to [facility] hospital for blood transfusion.  I spoke to Dr Maryan Rued at [place] who is aware of patient arriving by vehicle.  She will need a type and screen upon her arrival.   [MT]    Clinical Course User Index [MT] Selassie Spatafore, Carola Rhine, MD    Final Clinical Impression(s) / ED Diagnoses Final diagnoses:  Iron deficiency  anemia due to chronic blood loss    Rx / DC Orders ED Discharge Orders    None       Wyvonnia Dusky, MD 06/26/20 1104

## 2020-06-26 NOTE — ED Notes (Addendum)
RN called [facility], [facility], to notify patient is coming to their facility POV, by daughter, for Blood Transfusion.   Dr. Maryan Rued is accepting physician.

## 2020-06-26 NOTE — ED Triage Notes (Signed)
Pt came c/o of needing a blood transfusion. Pt saw her doctor on Friday and they called her to tell her she needed to go to the closest ER. NAD noted.

## 2020-06-26 NOTE — Discharge Instructions (Addendum)
I have printed out the information for Dr. Garwin Brothers.  Although she is no longer at your practice she is practicing as an OB/GYN in [facility].  Please follow-up with her.  I have also discussed with on-call OB/GYN who recommended medication Megace.  Please take as prescribed until you follow-up with Dr. Garwin Brothers.  You received 1 unit transfusion of red blood cells today.  Please have your labs rechecked by your primary care provider within the next week.  You may return to the ER for any new or concerning symptoms.

## 2020-06-26 NOTE — ED Provider Notes (Signed)
Patient here from dropper him to the emergency room transferred for a blood transfusion.  I had a lengthy discussion with patient about blood transfusion she consents to infusion.  She understands risks and benefits of this.  Patient has a hemoglobin of 6.3.  She is having symptoms of fatigue.  She has normal platelets.  BMP unremarkable.  Will obtain type and screen and provide with 1 unit of PRBC.  Discussed with patient follow-up with OB/GYN.  She does not have an OB/GYN currently.  Discussed with on-call OB/GYN Dr. Mancel Bale who states that Dr. Garwin Brothers is still in practice.  Patient was not aware of this.  Will provide patient with the information for Dr. Garwin Brothers current practice.  Dr. Mancel Bale also recommended Megace 40 mg twice daily until follow-up with OB/GYN.  Will prescribe this to patient and prescription so it is available to her if she would like to fill.   2:09 PM I discussed with bedside RN, PRBC is ready.  RN will obtain and transfuse.  Patient is aware of plan.  She will be discharged after transfusion.  She is agreeable to plan. Chauncey Cruel will reassess and (likely) DC after transfusion.    Pati Gallo [place], [place] 06/26/20 1500    Blanchie Dessert, MD 06/26/20 1527

## 2020-06-26 NOTE — ED Notes (Signed)
CRITICAL VALUE STICKER  CRITICAL VALUE:Hemoglobin 6.3  RECEIVER (on-site recipient of call):Shawnie Pons, RN  DATE & TIME NOTIFIED: 03-29-2020 1026  MESSENGER (representative from lab):  MD NOTIFIED: Dr. Langston Masker  TIME OF NOTIFICATION:1027  RESPONSE:

## 2020-06-26 NOTE — ED Provider Notes (Signed)
  Physical Exam  BP 122/67 (BP Location: Right Arm)   Pulse 69   Temp 99 F (37.2 C) (Oral)   Resp 18   Ht 5\' 3"  (1.6 m)   Wt 81.6 kg   SpO2 100%   BMI 31.89 kg/m   Physical Exam  ED Course/Procedures   Clinical Course as of 06/26/20 1824  Wed Jun 26, 2020  1051 I reassessed the patient.  She remains hemodynamically stable.  Her hemoglobin is 6.3, although I suspect this is largely chronic.  Her vital signs are stable.  Her daughter is now present at bedside.  I do believe that she is stable to go by private vehicle directly to [facility] for blood transfusion.  I spoke to Dr Maryan Rued at [facility] who is aware of patient arriving by vehicle.  She will need a type and screen upon her arrival.   [MT]    Clinical Course User Index [MT] Trifan, Carola Rhine, MD    Procedures  MDM  Patient care assumed from Waldo PA at shift change, please see his note for full HPI.  Briefly, patient here for symptomatic anemia and source to her fibroids.  Prior care received by Dr. Garwin Brothers.  OB/GYN on-call was consulted Dr. Mancel Bale, who recommended Megace prescription. Plan is for complete transfusion of 1 unit along with disposition home.  04:30 PM patient was teary-eyed during our conversation, she has multiple questions about the Megace and side effects.  I will personally print off a pamphlet for listed side effects and adverse effects.  She is going to call Dr. Garwin Brothers practice today in order to schedule an appointment to be seen.  We discussed that she will need to weigh in risks and benefits of taking this medication.  Daughter at the bedside is agreeable of this.  6:22 PM blood transfusion has been completed, patient is without any reaction.  I will patient stable for discharge.  Patient was given a handout from [employer] for education.  Return precautions discussed at length.  Portions of this note were generated with [employer]. Dictation errors may occur despite best  attempts at proofreading.       Janeece Fitting, PA-C 06/26/20 Milus Mallick, MD 07/02/20 [phone]

## 2020-06-27 LAB — BPAM RBC
Blood Product Expiration Date: 202206242359
ISSUE DATE / TIME: 202205251546
Unit Type and Rh: 6200

## 2020-06-27 LAB — TYPE AND SCREEN
ABO/RH(D): A POS
Antibody Screen: NEGATIVE
Unit division: 0

## 2020-06-28 ENCOUNTER — Other Ambulatory Visit: Payer: Self-pay | Admitting: Urgent Care

## 2020-06-28 DIAGNOSIS — R102 Pelvic and perineal pain: Secondary | ICD-10-CM

## 2020-07-05 DIAGNOSIS — E559 Vitamin D deficiency, unspecified: Secondary | ICD-10-CM | POA: Insufficient documentation

## 2020-07-22 ENCOUNTER — Other Ambulatory Visit: Payer: Self-pay

## 2020-07-22 ENCOUNTER — Ambulatory Visit
Admission: RE | Admit: 2020-07-22 | Discharge: 2020-07-22 | Disposition: A | Discharge status: Home / Self Care | Payer: 59 | Source: Ambulatory Visit | Attending: Urgent Care | Admitting: Urgent Care

## 2020-07-22 DIAGNOSIS — R102 Pelvic and perineal pain: Secondary | ICD-10-CM

## 2020-07-22 MED ORDER — IOPAMIDOL (ISOVUE-300) INJECTION 61%
100.0000 mL | Freq: Once | INTRAVENOUS | Status: AC | PRN
  Administered 2020-07-22: 100 mL via INTRAVENOUS

## 2020-08-13 ENCOUNTER — Other Ambulatory Visit: Payer: Self-pay

## 2020-08-13 ENCOUNTER — Encounter: Payer: Self-pay | Admitting: Family Medicine

## 2020-08-13 ENCOUNTER — Ambulatory Visit (INDEPENDENT_AMBULATORY_CARE_PROVIDER_SITE_OTHER): Payer: 59 | Admitting: Family Medicine

## 2020-08-13 VITALS — BP 153/105 | HR 81 | Wt 182.3 lb

## 2020-08-13 DIAGNOSIS — F419 Anxiety disorder, unspecified: Secondary | ICD-10-CM | POA: Diagnosis not present

## 2020-08-13 DIAGNOSIS — D500 Iron deficiency anemia secondary to blood loss (chronic): Secondary | ICD-10-CM

## 2020-08-13 DIAGNOSIS — N920 Excessive and frequent menstruation with regular cycle: Secondary | ICD-10-CM

## 2020-08-13 DIAGNOSIS — F32A Depression, unspecified: Secondary | ICD-10-CM

## 2020-08-13 DIAGNOSIS — D251 Intramural leiomyoma of uterus: Secondary | ICD-10-CM

## 2020-08-13 NOTE — Assessment & Plan Note (Signed)
Related to her fibroids. Continue Megace.

## 2020-08-13 NOTE — Assessment & Plan Note (Signed)
Option of hysterectomy, myomectomy (would need to want more kids), sonata, UFE, Oriahnn, DepoLupron, IUD, continued progestins discussed, written and verbal information shared. She will take time to review all options and let us know which way she would like to go.

## 2020-08-13 NOTE — Patient Instructions (Addendum)
Sonata Treatment The Sonata Treatment is an incision-free solution, clinically proven to reduce fibroid symptoms. This treatment can address a wide range of fibroid types, sizes, and uterine locations. Multiple fibroids can be treated during a single procedure.  The fibroids are treated from inside the uterus, so there are no incisions (which means no scarring), no tissue needs to be cut or surgically removed - and the uterus is preserved.  [id number] The Sonata procedure typically takes less than one hour, depending on the number and size of fibroids treated.  On average, women return to normal activity in a couple of days. Most patients see improvement in heavy menstrual bleeding within 3 months.  How Sonata Works [employer] uses a handpiece with a miniature high-resolution ultrasound tip that allows the doctor to see the fibroids from inside the uterus during the procedure, and then treat each fibroid with a process called an "ablation."  [id number] Step 1 The doctor passes the [place] Treatment handpiece through the vagina and into the uterus.  [id number] Step 2 Ultrasound waves from the tip are used to locate the fibroid.  [id number] Step 3 The Sonata Treatment handpiece delivers radiofrequency energy to shrink the fibroid over time and reduce symptoms.  Possible Side Effects Common side effects include bleeding, spotting, cramping, post-ablation inflammatory symptoms, and/or discharge. Your doctor should discuss all potential risks in detail.  View complete indications for use and safety information below.

## 2020-08-13 NOTE — Assessment & Plan Note (Signed)
On iron repletion-change to qod dosing

## 2020-08-13 NOTE — Progress Notes (Signed)
   Subjective:    Patient ID: [name] is a 45 y.o. female presenting with Gynecologic Exam  on 08/13/2020  HPI: Has long h/o fibroids and enlarged uterus. Has seen Dr. Ruthann Cancer and Dr. Garwin Brothers for same. Has needed blood transfusion in the past. Cycles are monthly. Some intermenstrual bleeding. Reports endometrial biopsy with Dr. Garwin Brothers about 1 year ago. Has been offered hysterectomy, myomectomy in the past. Also offered DepoLupron.  Review of Systems  Constitutional:  Negative for chills and fever.  Respiratory:  Negative for shortness of breath.   Cardiovascular:  Negative for chest pain.  Gastrointestinal:  Negative for abdominal pain, nausea and vomiting.  Genitourinary:  Negative for dysuria.  Skin:  Negative for rash.     Objective:    BP (!) 153/105   Pulse 81   Wt 182 lb 4.8 oz (82.7 kg)   BMI 32.29 kg/m  Physical Exam Constitutional:      General: She is not in acute distress.    Appearance: She is well-developed.  HENT:     Head: Normocephalic and atraumatic.  Eyes:     General: No scleral icterus. Cardiovascular:     Rate and Rhythm: Normal rate.  Pulmonary:     Effort: Pulmonary effort is normal.  Abdominal:     Palpations: Abdomen is soft.  Musculoskeletal:     Cervical back: Neck supple.  Skin:    General: Skin is warm and dry.  Neurological:     Mental Status: She is alert and oriented to person, place, and time.        Assessment & Plan:   Problem List Items Addressed This Visit       Unprioritized   Fibroid uterus    Option of hysterectomy, myomectomy (would need to want more kids), sonata, UFE, Oriahnn, DepoLupron, IUD, continued progestins discussed, written and verbal information shared. She will take time to review all options and let us know which way she would like to go.       Iron deficiency anemia due to chronic blood loss    On iron repletion-change to qod dosing       Menorrhagia with regular cycle    Related to her  fibroids. Continue Megace.       Other Visit Diagnoses     Anxiety and depression    -  Primary   Relevant Orders   Ambulatory referral to [place]       Total time in review of prior notes, pathology, labs, history taking, review with patient, exam, note writing, discussion of options, including surgical, medical treatment options, radiology review, discussion of bleeding, hospitalizations and need for transfusions, plan for next steps, alternatives and risks of treatment: 39 minutes.  Return in about 4 weeks (around 09/10/2020) for needs MD.  Donnamae Jude 08/13/2020 12:00 PM

## 2020-08-19 ENCOUNTER — Encounter: Payer: Self-pay | Admitting: Obstetrics and Gynecology

## 2020-08-19 ENCOUNTER — Other Ambulatory Visit: Payer: Self-pay

## 2020-08-19 ENCOUNTER — Ambulatory Visit (INDEPENDENT_AMBULATORY_CARE_PROVIDER_SITE_OTHER): Payer: 59 | Admitting: Obstetrics and Gynecology

## 2020-08-19 VITALS — BP 150/90 | HR 86 | Ht 64.0 in | Wt 183.0 lb

## 2020-08-19 DIAGNOSIS — N920 Excessive and frequent menstruation with regular cycle: Secondary | ICD-10-CM

## 2020-08-19 DIAGNOSIS — D259 Leiomyoma of uterus, unspecified: Secondary | ICD-10-CM

## 2020-08-19 DIAGNOSIS — D500 Iron deficiency anemia secondary to blood loss (chronic): Secondary | ICD-10-CM | POA: Diagnosis not present

## 2020-08-19 LAB — FERRITIN: Ferritin: 5 ng/mL — ABNORMAL LOW (ref 16–232)

## 2020-08-19 LAB — CBC
HCT: 33.6 % — ABNORMAL LOW (ref 35.0–45.0)
Hemoglobin: 9.3 g/dL — ABNORMAL LOW (ref 11.7–15.5)
MCH: 20.2 pg — ABNORMAL LOW (ref 27.0–33.0)
MCHC: 27.7 g/dL — ABNORMAL LOW (ref 32.0–36.0)
MCV: 73 fL — ABNORMAL LOW (ref 80.0–100.0)
MPV: 9.5 fL (ref 7.5–12.5)
Platelets: 355 10*3/uL (ref 140–400)
RBC: 4.6 10*6/uL (ref 3.80–5.10)
RDW: 19 % — ABNORMAL HIGH (ref 11.0–15.0)
WBC: 4 10*3/uL (ref 3.8–10.8)

## 2020-08-19 MED ORDER — MEGESTROL ACETATE 40 MG PO TABS: 40.0000 mg | ORAL_TABLET | Freq: Two times a day (BID) | ORAL | 3 refills | Status: DC

## 2020-08-19 NOTE — Patient Instructions (Signed)
Uterine Artery Embolization for Fibroids, Care After This sheet gives you information about how to care for yourself after your procedure. Your health care provider may also give you more specific instructions. If you have problems or questions, contact your health careprovider. What can I expect after the procedure? After your procedure, it is common to have: Pelvic cramping. You will be given pain medicine. Nausea and vomiting. You may be given medicine to help relieve nausea. Follow these instructions at home: Incision care Follow instructions from your health care provider about how to take care of your incision. Make sure you: Wash your hands with soap and water before you change your bandage (dressing). If soap and water are not available, use hand sanitizer. Change your dressing as told by your health care provider. Check your incision area every day for signs of infection. Check for: More redness, swelling, or pain. More fluid or blood. Warmth. Pus or a bad smell. Medicines  Take over-the-counter and prescription medicines only as told by your health care provider. Do not take aspirin. It can cause bleeding. Do not drive for 24 hours if you were given a medicine to help you relax (sedative). Do not drive or use heavy machinery while taking prescription pain medicine.  General instructions Ask your health care provider when you can resume sexual activity. To prevent or treat constipation while you are taking prescription pain medicine, your health care provider may recommend that you: Drink enough fluid to keep your urine clear or pale yellow. Take over-the-counter or prescription medicines. Eat foods that are high in fiber, such as fresh fruits and vegetables, whole grains, and beans. Limit foods that are high in fat and processed sugars, such as fried and sweet foods. Contact a health care provider if: You have a fever. You have more redness, swelling, or pain around your  incision site. You have more fluid or blood coming from your incision site. Your incision feels warm to the touch. You have pus or a bad smell coming from your incision. You have a rash. You have uncontrolled nausea or you cannot eat or drink anything without vomiting. Get help right away if: You have trouble breathing. You have chest pain. You have severe abdominal pain. You have leg pain. You become dizzy and faint. Summary After your procedure, it is common to have pelvic cramping. You will be given pain medicine. Follow instructions from your health care provider about how to take care of your incision. Check your incision area every day for signs of infection. Take over-the-counter and prescription medicines only as told by your health care provider. This information is not intended to replace advice given to you by your health care provider. Make sure you discuss any questions you have with your healthcare provider. Document Revised: 01/01/2017 Document Reviewed: 04/23/2016 Elsevier Patient Education  2021 [place]. Uterine Fibroids  Uterine fibroids, also called leiomyomas, are noncancerous (benign) tumors that can grow in the uterus. They can cause heavy menstrual bleeding and pain. Fibroids may also grow in the fallopian tubes, cervix, or tissues (ligaments) near the uterus. You may have one or many fibroids. Fibroids vary in size, weight, and where they grow in the uterus. Some can become quite large. Most fibroids do notrequire medical treatment. What are the causes? The cause of this condition is not known. What increases the risk? You are more likely to develop this condition if you: Are in your 30s or 40s and have not gone through menopause. Have a family  history of this condition. Are of African American descent. Started your menstrual period at age 53 or younger. Have never given birth. Are overweight or obese. What are the signs or symptoms? Many women do not  have any symptoms. Symptoms of this condition may include: Heavy menstrual bleeding. Bleeding between menstrual periods. Pain and pressure in the pelvic area, between your hip bones. Pain during sex. Bladder problems, such as needing to urinate right away or more often than usual. Inability to have children (infertility). Failure to carry pregnancy to term (miscarriage). How is this diagnosed? This condition may be diagnosed based on: Your symptoms and medical history. A physical exam. A pelvic exam that includes feeling for any tumors. Imaging tests, such as ultrasound or MRI. How is this treated? Treatment for this condition may include follow-up visits with your health care provider to monitor your fibroids for any changes. Other treatment may include: Medicines, such as: Medicines to relieve pain, including aspirin and NSAIDs, such as ibuprofen or naproxen. Hormone therapy. Treatment may be given as a pill or an injection, or it may be inserted into the uterus using an intrauterine device (IUD). Surgery that would do one of the following: Remove the fibroids (myomectomy). This may be recommended if fibroids affect your fertility and you want to become pregnant. Remove the uterus (hysterectomy). Block the blood supply to the fibroids (uterine artery embolization). This can cause them to shrink and die. Follow these instructions at home: Medicines Take over-the-counter and prescription medicines only as told by your health care provider. Ask your health care provider if you should take iron pills or eat more iron-rich foods, such as dark green, leafy vegetables. Heavy menstrual bleeding can cause low iron levels. Managing pain If directed, apply heat to your back or abdomen to reduce pain. Use the heat source that your health care provider recommends, such as a moist heat pack or a heating pad. To apply heat: Place a towel between your skin and the heat source. Leave the heat on for  20-30 minutes. Remove the heat if your skin turns bright red. This is especially important if you are unable to feel pain, heat, or cold. You may have a greater risk of getting burned.  General instructions Pay close attention to your menstrual cycle. Tell your health care provider about any changes, such as: Heavier bleeding that requires you to change your pads or tampons more than usual. A change in the number of days that your menstrual period lasts. A change in symptoms that come with your menstrual period, such as back pain or cramps in your abdomen. Keep all follow-up visits. This is important, especially if your fibroids need to be monitored for any changes. Contact a health care provider if you: Have pelvic pain, back pain, or cramps in your abdomen that do not get better with medicine or heat. Develop new bleeding between menstrual periods. Have increased bleeding during or between menstrual periods. Feel more tired or weak than usual. Feel light-headed. Get help right away if you: Faint. Have pelvic pain that suddenly gets worse. Have severe vaginal bleeding that soaks a tampon or pad in 30 minutes or less. Summary Uterine fibroids are noncancerous (benign) tumors that can develop in the uterus. The exact cause of this condition is not known. Most fibroids do not require medical treatment unless they affect your ability to have children (fertility). Contact a health care provider if you have pelvic pain, back pain, or cramps in your abdomen that do  not get better with medicines. Get help right away if you faint, have pelvic pain that suddenly gets worse, or have severe vaginal bleeding. This information is not intended to replace advice given to you by your health care provider. Make sure you discuss any questions you have with your healthcare provider. Document Revised: 08/22/2019 Document Reviewed: 08/22/2019 Elsevier Patient Education  [place].

## 2020-08-19 NOTE — Progress Notes (Signed)
45 y.o. [id number] Single Black or African American Not Hispanic or Latino female here for bleeding. She states that she is unsure when the bleeding started but it has been going on for a while. She states that when she does have her period they are heavy. Was started on megace in May but is still having spotting to bleeding.   The patient has a known large fibroid uterus.  CT from 07/22/20: Reproductive: Marked enlargement of the uterus by multiple fibroids is again noted. The uterus measures approximately 21.4 x 12.5 x 19.1 cm (volume = 2680 cm^3), compared with 20.3 x 10.2 x 18.7 cm on MRI. The largest central fibroid is heterogeneous in density and measures up to 13.3 cm transverse on image 19/2, similar to previous MRI. There are several peripherally calcified fibroids. No significant pedunculated fibroids. No evidence of adnexal mass. The left ovary is not well visualized.   Other:  No other pelvic mass, ascites or free air.  No hydronephrosis.   She has had repeated issues with severe anemia, requiring several blood transfusions. She was in the ER in 5/22, Hgb was 6.3 gm/dl. She had a blood transfusion and was started on megace and iron.   Prior to 5/22 she was having monthly cycles for 7-9 days. Heavy for 3 days, she could saturate an ultra tampon with in one hour, +cramps.  Since starting megace, she is still having some bleeding. Can vary from spotting to heavy bleeding. At times she is changing a tampon in 2-3 hours. She can have a few days of bleeding in a row.   She c/o fatigue, frequent urination, occasional lower abdominal discomfort.  Not sexually active.   She reports having had a normal endometrial biopsy with Dr Garwin Brothers last year.     No LMP recorded.          Sexually active: No.  The current method of family planning is none.    Exercising: No.  The patient does not participate in regular exercise at present. Smoker:  no  Health Maintenance: Pap:  Thinks she had  one this year with her primary. 05/07/2015 Neg 08/21/14 ASCUS History of abnormal Pap:  yes Ascus, no surgery on her cervix  MMG:  11/02/14 density C Bi-rads 1 neg  BMD:   None  Colonoscopy: none  TDaP:  Unsure  Gardasil: NA    reports that she has never smoked. She has never used smokeless tobacco. She reports that she does not drink alcohol and does not use drugs. 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 2 daughters, 26 and 97.   Past Medical History:  Diagnosis Date   Anemia    Asthma    Gestational diabetes    Uterine fibroid     Past Surgical History:  Procedure Laterality Date   arm surgery     CERVICAL CERCLAGE     CESAREAN SECTION     DILATION AND CURETTAGE OF UTERUS     TOOTH EXTRACTION      Current Outpatient Medications  Medication Sig Dispense Refill   albuterol (PROVENTIL HFA;VENTOLIN HFA) 108 (90 Base) MCG/ACT inhaler Inhale 2 puffs into the lungs every 6 (six) hours as needed for wheezing or shortness of breath.     cetirizine (ZYRTEC ALLERGY) 10 MG tablet Take 1 tablet (10 mg total) by mouth daily. 30 tablet 2   fluticasone (FLONASE) 50 MCG/ACT nasal spray Place 1 spray into both nostrils in the morning and  at bedtime. 16 g 2   Iron Polysacch Cmplx-B12-FA 150-0.025-1 MG CAPS Take 1 capsule by mouth daily. 30 capsule 11   megestrol (MEGACE) 40 MG tablet Take 1 tablet by mouth 2 (two) times daily.     No current facility-administered medications for this visit.    Family History  Problem Relation Age of Onset   Hypertension Mother    Hypertension Brother    Breast cancer Maternal Aunt    Hypertension Maternal Uncle     Review of Systems  Genitourinary:  Positive for menstrual problem and vaginal bleeding.  Psychiatric/Behavioral:  Positive for dysphoric mood. The patient is nervous/anxious.   All other systems reviewed and are negative.  Exam:   BP (!) 150/90   Pulse 86   Ht 5\' 4"  (1.626 m)   Wt 183 lb (83 kg)   SpO2  100%   BMI 31.41 kg/m   Weight change: @WEIGHTCHANGE @ Height:   Height: 5\' 4"  (162.6 cm)  Ht Readings from Last 3 Encounters:  08/19/20 5\' 4"  (1.626 m)  06/26/20 5\' 3"  (1.6 m)  06/12/19 5\' 4"  (1.626 m)    General appearance: alert, cooperative and appears stated age Head: Normocephalic, without obvious abnormality, atraumatic Neck: no adenopathy, supple, symmetrical, trachea midline and thyroid normal to inspection and palpation Lungs: clear to auscultation bilaterally Cardiovascular: regular rate and rhythm Abdomen: soft, non-tender; 28 week sized uterus. Extremities: extremities normal, atraumatic, no cyanosis or edema Skin: Skin color, texture, turgor normal. No rashes or lesions Lymph nodes: Cervical, supraclavicular, and axillary nodes normal. No abnormal inguinal nodes palpated Neurologic: Grossly normal   Pelvic: External genitalia:  no lesions              Urethra:  normal appearing urethra with no masses, tenderness or lesions              Bartholins and Skenes: normal                 Vagina: normal appearing vagina with normal color and discharge, no lesions              Cervix: no lesions               Bimanual Exam:  Uterus:   ~28 week sized uterus, firm, some mobility, up out of the pelvis.               Adnexa:                  Gae Dry chaperoned for the exam.  1. Menorrhagia with regular cycle Patient has required several blood transfusions. Last year her hgb was down to 4.4 gm/dl. I discussed with her that it was very dangerous to hemorrhage to this degree. I strongly recommended that she do either medical or surgical treatment. - CBC - Ferritin - megestrol (MEGACE) 40 MG tablet; Take 1 tablet (40 mg total) by mouth 2 (two) times daily.  Dispense: 180 tablet; Refill: 3. Will continue megace for now   2. Uterine leiomyoma, unspecified location 28 week sized uterus, growing, associated with menorrhagia and severe anemia. -We discussed options of GNR[facility]  agonists and antagonists, continued megace, uterine artery embolization, and surgery. Given her current BP, she is not a candidate for OCP's and I doubt a mirena IUD would be helpful given the size of her uterus.  I would recommend prior to a hysterectomy that she use Oriahnn, lupron, or have a uterine artery embolization to try and shrink her fibroids.  She doesn't think that she wants a hysterectomy.  We discussed the option of myomectomy, but I would not recommend that unless she plans on future pregnancy. With her uterine size, myomectomy would likely be a longer surgery with higher blood loss.  I briefly discussed that at her age she would likely need donor egg to conceive. Given the size of her uterus I would not recommend she get pregnant now. I discussed that if she comes into the hospital hemorrhaging, she could end up needing an emergency hysterectomy which would likely be done through a laparotomy. If she takes medication to shrink her fibroids, I think it is possible to perform a laparoscopic hysterectomy.  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 Will continue megace for now  3. Iron deficiency anemia due to chronic blood loss - CBC - Ferritin  Over 60 minutes was spent in total patient care.   Addendum: information was given on Oriahnn, uterine artery embolization and fibroids.

## 2020-08-20 ENCOUNTER — Telehealth: Payer: Self-pay | Admitting: *Deleted

## 2020-08-20 DIAGNOSIS — D508 Other iron deficiency anemias: Secondary | ICD-10-CM

## 2020-08-20 DIAGNOSIS — D500 Iron deficiency anemia secondary to blood loss (chronic): Secondary | ICD-10-CM

## 2020-08-20 NOTE — Telephone Encounter (Signed)
-----   Message from Salvadore Dom, MD sent at 08/20/2020 12:07 PM EDT ----- Please let the patient know that she is still anemic, but better than in May. Her iron stores are very low.  Please set her up for a hematology consultation for IV iron and let her know we are doing this, oral iron isn't enough.  Can you also please put in a referral to interventional radiology for a consultation for uterine artery embolization. I would also like to start working on approval for [employer] (Diagnosis: fibroid uterus, menorrhagia, anemia), can you please send her chart to the correct person to work on that approval.

## 2020-08-20 NOTE — Telephone Encounter (Signed)
Patient informed with below note. She wanted to proceed with Hematology referral placed a [facility] they will call to schedule. However she did not want to proceed with UEA or  Oriahnn Rx Patient said she will research medication and UEA and get back with [place] if she wants to proceed.   Dr.Jertson I am not familiar with this medication, I need Rx directions once the Rx is sent to the pharmacy and ran under patient insurance the insurance will let me know if approval/denial.

## 2020-08-23 ENCOUNTER — Other Ambulatory Visit: Payer: Self-pay | Admitting: Family Medicine

## 2020-09-13 ENCOUNTER — Ambulatory Visit: Payer: Self-pay | Admitting: Obstetrics and Gynecology

## 2020-09-23 ENCOUNTER — Other Ambulatory Visit: Payer: Self-pay

## 2020-09-23 ENCOUNTER — Ambulatory Visit
Admission: EM | Admit: 2020-09-23 | Discharge: 2020-09-23 | Disposition: A | Discharge status: Home / Self Care | Payer: 59

## 2020-09-23 ENCOUNTER — Encounter: Payer: Self-pay | Admitting: Emergency Medicine

## 2020-09-23 DIAGNOSIS — M79605 Pain in left leg: Secondary | ICD-10-CM | POA: Diagnosis not present

## 2020-09-23 DIAGNOSIS — S39012A Strain of muscle, fascia and tendon of lower back, initial encounter: Secondary | ICD-10-CM

## 2020-09-23 DIAGNOSIS — M79602 Pain in left arm: Secondary | ICD-10-CM | POA: Diagnosis not present

## 2020-09-23 DIAGNOSIS — M79671 Pain in right foot: Secondary | ICD-10-CM

## 2020-09-23 DIAGNOSIS — S46912A Strain of unspecified muscle, fascia and tendon at shoulder and upper arm level, left arm, initial encounter: Secondary | ICD-10-CM

## 2020-09-23 NOTE — Discharge Instructions (Addendum)
Please alternate ice and heat application to back. Use ice application to remainder of areas of pain. Suspect that pain will resolve in the next 1-2 weeks. If pain does not resolve, please follow up with orthopedist provide contact information for further evaluation and management.
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
## 2020-10-10 ENCOUNTER — Emergency Department (HOSPITAL_COMMUNITY)
Admission: EM | Admit: 2020-10-10 | Discharge: 2020-10-11 | Disposition: A | Discharge status: Home / Self Care | Payer: Medicaid Other | Attending: Emergency Medicine | Admitting: Emergency Medicine

## 2020-10-10 ENCOUNTER — Emergency Department (HOSPITAL_COMMUNITY): Payer: Medicaid Other

## 2020-10-10 ENCOUNTER — Ambulatory Visit
Admission: EM | Admit: 2020-10-10 | Discharge: 2020-10-10 | Disposition: A | Discharge status: Home / Self Care | Payer: Medicaid Other | Attending: Urgent Care | Admitting: Urgent Care

## 2020-10-10 ENCOUNTER — Other Ambulatory Visit: Payer: Self-pay

## 2020-10-10 ENCOUNTER — Encounter (HOSPITAL_COMMUNITY): Payer: Self-pay

## 2020-10-10 DIAGNOSIS — D259 Leiomyoma of uterus, unspecified: Secondary | ICD-10-CM

## 2020-10-10 DIAGNOSIS — J45909 Unspecified asthma, uncomplicated: Secondary | ICD-10-CM | POA: Insufficient documentation

## 2020-10-10 DIAGNOSIS — Z7951 Long term (current) use of inhaled steroids: Secondary | ICD-10-CM | POA: Diagnosis not present

## 2020-10-10 DIAGNOSIS — Z8719 Personal history of other diseases of the digestive system: Secondary | ICD-10-CM | POA: Diagnosis present

## 2020-10-10 DIAGNOSIS — R109 Unspecified abdominal pain: Secondary | ICD-10-CM | POA: Diagnosis present

## 2020-10-10 DIAGNOSIS — N939 Abnormal uterine and vaginal bleeding, unspecified: Secondary | ICD-10-CM

## 2020-10-10 DIAGNOSIS — R140 Abdominal distension (gaseous): Secondary | ICD-10-CM | POA: Insufficient documentation

## 2020-10-10 DIAGNOSIS — D500 Iron deficiency anemia secondary to blood loss (chronic): Secondary | ICD-10-CM

## 2020-10-10 DIAGNOSIS — R1031 Right lower quadrant pain: Secondary | ICD-10-CM | POA: Insufficient documentation

## 2020-10-10 DIAGNOSIS — Z91040 Latex allergy status: Secondary | ICD-10-CM | POA: Diagnosis not present

## 2020-10-10 DIAGNOSIS — N9489 Other specified conditions associated with female genital organs and menstrual cycle: Secondary | ICD-10-CM | POA: Insufficient documentation

## 2020-10-10 DIAGNOSIS — R100 Acute abdomen: Secondary | ICD-10-CM | POA: Diagnosis present

## 2020-10-10 LAB — CBC WITH DIFFERENTIAL/PLATELET
Abs Immature Granulocytes: 0 10*3/uL (ref 0.00–0.07)
Basophils Absolute: 0 10*3/uL (ref 0.0–0.1)
Basophils Relative: 1 %
Eosinophils Absolute: 0.2 10*3/uL (ref 0.0–0.5)
Eosinophils Relative: 4 %
HCT: 26.3 % — ABNORMAL LOW (ref 36.0–46.0)
Hemoglobin: 6.9 g/dL — CL (ref 12.0–15.0)
Immature Granulocytes: 0 %
Lymphocytes Relative: 44 %
Lymphs Abs: 2.1 10*3/uL (ref 0.7–4.0)
MCH: 19 pg — ABNORMAL LOW (ref 26.0–34.0)
MCHC: 26.2 g/dL — ABNORMAL LOW (ref 30.0–36.0)
MCV: 72.5 fL — ABNORMAL LOW (ref 80.0–100.0)
Monocytes Absolute: 0.3 10*3/uL (ref 0.1–1.0)
Monocytes Relative: 7 %
Neutro Abs: 2 10*3/uL (ref 1.7–7.7)
Neutrophils Relative %: 44 %
Platelets: 394 10*3/uL (ref 150–400)
RBC: 3.63 MIL/uL — ABNORMAL LOW (ref 3.87–5.11)
RDW: 16.4 % — ABNORMAL HIGH (ref 11.5–15.5)
WBC: 4.6 10*3/uL (ref 4.0–10.5)
nRBC: 0 % (ref 0.0–0.2)

## 2020-10-10 LAB — URINALYSIS, ROUTINE W REFLEX MICROSCOPIC
Bacteria, UA: NONE SEEN
Bilirubin Urine: NEGATIVE
Glucose, UA: NEGATIVE mg/dL
Ketones, ur: NEGATIVE mg/dL
Leukocytes,Ua: NEGATIVE
Nitrite: NEGATIVE
Protein, ur: NEGATIVE mg/dL
RBC / HPF: >50 RBC/hpf — ABNORMAL HIGH (ref 0–5)
Specific Gravity, Urine: 1.02 (ref 1.005–1.030)
pH: 5 (ref 5.0–8.0)

## 2020-10-10 LAB — POCT URINALYSIS DIP (MANUAL ENTRY)
Bilirubin, UA: NEGATIVE
Glucose, UA: NEGATIVE mg/dL
Ketones, POC UA: NEGATIVE mg/dL
Leukocytes, UA: NEGATIVE
Nitrite, UA: NEGATIVE
Protein Ur, POC: NEGATIVE mg/dL
Spec Grav, UA: 1.025 (ref 1.010–1.025)
Urobilinogen, UA: 1 E.U./dL
pH, UA: 6 (ref 5.0–8.0)

## 2020-10-10 LAB — COMPREHENSIVE METABOLIC PANEL
ALT: 8 U/L (ref 0–44)
AST: 12 U/L — ABNORMAL LOW (ref 15–41)
Albumin: 3.8 g/dL (ref 3.5–5.0)
Alkaline Phosphatase: 44 U/L (ref 38–126)
Anion gap: 9 (ref 5–15)
BUN: 9 mg/dL (ref 6–20)
CO2: 25 mmol/L (ref 22–32)
Calcium: 9 mg/dL (ref 8.9–10.3)
Chloride: 106 mmol/L (ref 98–111)
Creatinine, Ser: 0.71 mg/dL (ref 0.44–1.00)
GFR, Estimated: >60 mL/min (ref >60)
Glucose, Bld: 99 mg/dL (ref 70–99)
Potassium: 3.6 mmol/L (ref 3.5–5.1)
Sodium: 140 mmol/L (ref 135–145)
Total Bilirubin: 0.9 mg/dL (ref 0.3–1.2)
Total Protein: 7.2 g/dL (ref 6.5–8.1)

## 2020-10-10 LAB — I-STAT BETA HCG BLOOD, ED (MC, WL, AP ONLY): I-stat hCG, quantitative: <5 m[IU]/mL (ref <5)

## 2020-10-10 LAB — LIPASE, BLOOD: Lipase: 33 U/L (ref 11–51)

## 2020-10-10 LAB — PREPARE RBC (CROSSMATCH)

## 2020-10-10 MED ORDER — SODIUM CHLORIDE 0.9 % IV SOLN
10.0000 mL/h | Freq: Once | INTRAVENOUS | Status: AC
  Administered 2020-10-10: 10 mL/h via INTRAVENOUS

## 2020-10-10 MED ORDER — KETOROLAC TROMETHAMINE 60 MG/2ML IM SOLN
60.0000 mg | Freq: Once | INTRAMUSCULAR | Status: AC
  Administered 2020-10-10: 60 mg via INTRAMUSCULAR

## 2020-10-10 MED ORDER — IOHEXOL 350 MG/ML SOLN
80.0000 mL | Freq: Once | INTRAVENOUS | Status: AC | PRN
  Administered 2020-10-10: 80 mL via INTRAVENOUS

## 2020-10-10 NOTE — ED Provider Notes (Signed)
Emergency Medicine Provider Triage Evaluation Note  [name] , a 45 y.o. female  was evaluated in triage.  Pt complains of right lower quadrant abdominal pain over the last 3 days.  Was seen at urgent care but was sent here for further evaluation of possible appendicitis.  She reports associated distention, nausea, and chills.  Review of Systems  Positive:  Negative: Chest pain, shortness of breath, diarrhea, vomiting, fever  Physical Exam  BP 138/90 (BP Location: Left Arm)   Pulse 89   Temp 99.2 F (37.3 C) (Oral)   Resp 16   Ht '5\' 4"'$  (1.626 m)   Wt 83 kg   LMP 09/23/2020 (Approximate)   SpO2 96%   BMI 31.41 kg/m  Gen:   Awake, no distress   Resp:  Normal effort, clear to auscultation bilaterally MSK:   Moves extremities without difficulty  Other:  Moderate tenderness in the right lower quadrant.  There is moderate generalized abdominal distention  Medical Decision Making  Medically screening exam initiated at 3:39 PM.  Appropriate orders placed.  [name] was informed that the remainder of the evaluation will be completed by another provider, this initial triage assessment does not replace that evaluation, and the importance of remaining in the ED until their evaluation is complete.     Myna Bright [place], PA-C 10/10/20 1540    Milton Ferguson, MD 10/12/20 1334

## 2020-10-10 NOTE — Discharge Instructions (Signed)
Please report to the emergency room now for further evaluation to rule out an acute abdomen, diverticulitis. You have a distended abdomen with severe pain, history of diverticulosis and need this evaluation in the ER including CT scan of the abdomen and pelvis.

## 2020-10-10 NOTE — ED Notes (Signed)
Urine specimen/UA obtained, placed in triage urine specimen bin, holding for order.

## 2020-10-10 NOTE — ED Triage Notes (Signed)
Pt sent by UC for RLQ abd pain x3 days with nausea. Toradol at [facility] with relief.

## 2020-10-10 NOTE — ED Triage Notes (Signed)
Pt c/o RLQ abd pain described as 9/10 throbbing sharp onset x3 days ago. Denies constipation or change in bm. Tried heat and ice at home without relief.

## 2020-10-10 NOTE — ED Provider Notes (Signed)
[place]   MRN: [id number] DOB: Feb 26, 1975  Subjective:   [name] is a 45 y.o. female presenting for 3-day history of acute onset worsening severe abdominal pain worse over the right lower quadrant.  Patient has also noted that her abdomen is really distended.  She is having significant pain with any movement including walking.  She does feel somewhat comfortable laying down.  Has had some mild blood in her stool recently as well.  Denies fever, nausea, vomiting, constipation.  She does have a history of diverticulosis and an enlarged uterus and uterine fibroids.  However denies a history of diverticulitis.  No current facility-administered medications for this encounter.  Current Outpatient Medications:    albuterol (PROVENTIL HFA;VENTOLIN HFA) 108 (90 Base) MCG/ACT inhaler, Inhale 2 puffs into the lungs every 6 (six) hours as needed for wheezing or shortness of breath., Disp: , Rfl:    cetirizine (ZYRTEC ALLERGY) 10 MG tablet, Take 1 tablet (10 mg total) by mouth daily., Disp: 30 tablet, Rfl: 2   fluticasone (FLONASE) 50 MCG/ACT nasal spray, Place 1 spray into both nostrils in the morning and at bedtime., Disp: 16 g, Rfl: 2   Iron Polysacch Cmplx-B12-FA 150-0.025-1 MG CAPS, Take 1 capsule by mouth daily., Disp: 30 capsule, Rfl: 11   megestrol (MEGACE) 40 MG tablet, Take 1 tablet (40 mg total) by mouth 2 (two) times daily., Disp: 180 tablet, Rfl: 3   Allergies  Allergen Reactions   Shellfish Allergy Itching and Swelling    Throat swells    Latex    Other Itching and Swelling    Hazelnut    Past Medical History:  Diagnosis Date   Anemia    Asthma    Gestational diabetes    Uterine fibroid      Past Surgical History:  Procedure Laterality Date   arm surgery     CERVICAL CERCLAGE     CESAREAN SECTION     DILATION AND CURETTAGE OF UTERUS     TOOTH EXTRACTION      Family History  Problem Relation Age of Onset   Hypertension Mother    Hypertension  Brother    Breast cancer Maternal Aunt    Hypertension Maternal Uncle     Social History   Tobacco Use   Smoking status: Never   Smokeless tobacco: Never  Substance Use Topics   Alcohol use: No   Drug use: No    ROS   Objective:   Vitals: BP 131/85 (BP Location: Right Arm)   Pulse 94   Temp 98.8 F (37.1 C) (Oral)   Resp 18   LMP 09/23/2020 (Approximate)   SpO2 99%   Physical Exam Constitutional:      General: She is not in acute distress.    Appearance: Normal appearance. She is well-developed. She is not ill-appearing, toxic-appearing or diaphoretic.  HENT:     Head: Normocephalic and atraumatic.     Nose: Nose normal.     Mouth/Throat:     Mouth: Mucous membranes are moist.     Pharynx: Oropharynx is clear.  Eyes:     General: No scleral icterus.       Right eye: No discharge.        Left eye: No discharge.     Extraocular Movements: Extraocular movements intact.     Conjunctiva/sclera: Conjunctivae normal.     Pupils: Pupils are equal, round, and reactive to light.  Cardiovascular:     Rate and Rhythm: Normal rate  and regular rhythm.     Pulses: Normal pulses.     Heart sounds: Normal heart sounds. No murmur heard.   No friction rub. No gallop.  Pulmonary:     Effort: Pulmonary effort is normal. No respiratory distress.     Breath sounds: Normal breath sounds. No stridor. No wheezing, rhonchi or rales.  Abdominal:     General: Bowel sounds are normal. There is distension.     Palpations: Abdomen is soft. There is no mass.     Tenderness: There is abdominal tenderness in the right lower quadrant. There is guarding. There is no right CVA tenderness, left CVA tenderness or rebound.  Skin:    General: Skin is warm and dry.     Findings: No rash.  Neurological:     General: No focal deficit present.     Mental Status: She is alert and oriented to person, place, and time.  Psychiatric:        Mood and Affect: Mood normal.        Behavior: Behavior  normal.        Thought Content: Thought content normal.        Judgment: Judgment normal.    Results for orders placed or performed during the hospital encounter of 10/10/20 (from the past 24 hour(s))  POCT urinalysis dipstick     Status: Abnormal   Collection Time: 10/10/20 12:53 PM  Result Value Ref Range   Color, UA yellow yellow   Clarity, UA clear clear   Glucose, UA negative negative mg/dL   Bilirubin, UA negative negative   Ketones, POC UA negative negative mg/dL   Spec Grav, UA 1.025 1.010 - 1.025   Blood, UA moderate (A) negative   pH, UA 6.0 5.0 - 8.0   Protein Ur, POC negative negative mg/dL   Urobilinogen, UA 1.0 0.2 or 1.0 E.U./dL   Nitrite, UA Negative Negative   Leukocytes, UA Negative Negative   IM Toradol in clinic.  Assessment and Plan :   PDMP not reviewed this encounter.  1. Acute abdomen   2. RLQ abdominal pain   3. Abdominal distension   4. History of diverticulosis     Patient is in need of a higher level of care than we can provide in the urgent care today.  My concern is for an acute abdomen, acute diverticulitis.  She will likely need a CT scan of the abdomen pelvis to further evaluate for this.  She was given Tylenol in clinic, redirected to the emergency room.  She is hemodynamically stable, can go by personal vehicle.   Jaynee Eagles, PA-C 10/10/20 1421

## 2020-10-10 NOTE — ED Notes (Signed)
Pt ambulatory in ED lobby. 

## 2020-10-10 NOTE — ED Notes (Signed)
Blood consent signed electronically. ?

## 2020-10-10 NOTE — ED Provider Notes (Signed)
[facility] DEPT Provider Note   CSN: [id number] Arrival date & time: 10/10/20  1432     History Chief Complaint  Patient presents with   Abdominal Pain    [name] is a 45 y.o. female.   Abdominal Pain   Pt has been having pain for 3 days. No vomiting.  No fever.  No constipation.  No diarrhea.  Pt has noticed some vaginal bleeding.  She did have some today.  No pain with urinating.   Pain increased with squatting movement. Patient does have history of uterine fibroids.  She previously saw OB/GYN on November 18.  Patient was started on medications but has not noticed much improvement and has not followed up since then. Past Medical History:  Diagnosis Date   Anemia    Asthma    Gestational diabetes    Uterine fibroid     Patient Active Problem List   Diagnosis Date Noted   Vitamin D deficiency 07/05/2020   Iron deficiency anemia due to chronic blood loss 06/12/2019   Menorrhagia with regular cycle 06/12/2019   Sepsis ([place]) 12/30/2015   Fibroid uterus 12/30/2015    Past Surgical History:  Procedure Laterality Date   arm surgery     CERVICAL CERCLAGE     CESAREAN SECTION     DILATION AND CURETTAGE OF UTERUS     TOOTH EXTRACTION       OB History     Gravida  4   Para  3   Term  2   Preterm  1   AB  1   Living  2      SAB  1   IAB      Ectopic      Multiple  1   Live Births  3           Family History  Problem Relation Age of Onset   Hypertension Mother    Hypertension Brother    Breast cancer Maternal Aunt    Hypertension Maternal Uncle     Social History   Tobacco Use   Smoking status: Never   Smokeless tobacco: Never  Substance Use Topics   Alcohol use: No   Drug use: No    Home Medications Prior to Admission medications   Medication Sig Start Date End Date Taking? Authorizing Provider  albuterol (PROVENTIL HFA;VENTOLIN HFA) 108 (90 Base) MCG/ACT inhaler Inhale 2 puffs into the  lungs every 6 (six) hours as needed for wheezing or shortness of breath.    [provider]  cetirizine (ZYRTEC ALLERGY) 10 MG tablet Take 1 tablet (10 mg total) by mouth daily. 04/17/20   Volney American, PA-C  fluticasone [facility]) 50 MCG/ACT nasal spray Place 1 spray into both nostrils in the morning and at bedtime. 04/17/20   Volney American, PA-C  Iron Polysacch Cmplx-B12-FA 150-0.025-1 MG CAPS Take 1 capsule by mouth daily. 06/13/19   Servando Salina, MD  megestrol (MEGACE) 40 MG tablet Take 1 tablet (40 mg total) by mouth 2 (two) times daily. 08/19/20   Salvadore Dom, MD    Allergies    Shellfish allergy, Latex, and Other  Review of Systems   Review of Systems  Gastrointestinal:  Positive for abdominal pain.  All other systems reviewed and are negative.  Physical Exam Updated Vital Signs BP 128/70   Pulse 74   Temp 99.2 F (37.3 C) (Oral)   Resp 14   Ht 1.626 m ('5\' 4"'$ )  Wt 83 kg   LMP 09/23/2020 (Approximate)   SpO2 99%   BMI 31.41 kg/m   Physical Exam Vitals and nursing note reviewed.  Constitutional:      General: She is not in acute distress.    Appearance: She is well-developed.  HENT:     Head: Normocephalic and atraumatic.     Right Ear: External ear normal.     Left Ear: External ear normal.  Eyes:     General: No scleral icterus.       Right eye: No discharge.        Left eye: No discharge.     Conjunctiva/sclera: Conjunctivae normal.  Neck:     Trachea: No tracheal deviation.  Cardiovascular:     Rate and Rhythm: Normal rate and regular rhythm.  Pulmonary:     Effort: Pulmonary effort is normal. No respiratory distress.     Breath sounds: Normal breath sounds. No stridor. No wheezing or rales.  Abdominal:     General: Bowel sounds are normal. There is no distension.     Palpations: Abdomen is soft. There is mass.     Tenderness: There is no abdominal tenderness. There is no guarding or rebound.     Comments: Large  pelvic mass palpated above the umbilicus  Musculoskeletal:        General: No tenderness or deformity.     Cervical back: Neck supple.  Skin:    General: Skin is warm and dry.     Coloration: Skin is pale.     Findings: No rash.  Neurological:     General: No focal deficit present.     Mental Status: She is alert.     Cranial Nerves: No cranial nerve deficit (no facial droop, extraocular movements intact, no slurred speech).     Sensory: No sensory deficit.     Motor: No abnormal muscle tone or seizure activity.     Coordination: Coordination normal.  Psychiatric:        Mood and Affect: Mood normal.    ED Results / Procedures / Treatments   Labs (all labs ordered are listed, but only abnormal results are displayed) Labs Reviewed  CBC WITH DIFFERENTIAL/PLATELET - Abnormal; Notable for the following components:      Result Value   RBC 3.63 (*)    Hemoglobin 6.9 (*)    HCT 26.3 (*)    MCV 72.5 (*)    MCH 19.0 (*)    MCHC 26.2 (*)    RDW 16.4 (*)    All other components within normal limits  COMPREHENSIVE METABOLIC PANEL - Abnormal; Notable for the following components:   AST 12 (*)    All other components within normal limits  URINALYSIS, ROUTINE W REFLEX MICROSCOPIC - Abnormal; Notable for the following components:   Hgb urine dipstick MODERATE (*)    RBC / HPF >50 (*)    All other components within normal limits  LIPASE, BLOOD  I-STAT BETA HCG BLOOD, ED ([facility], [facility], [facility] ONLY)  PREPARE RBC (CROSSMATCH)  TYPE AND SCREEN    EKG None  Radiology CT Abdomen Pelvis W Contrast  Result Date: 10/10/2020 CLINICAL DATA:  Right lower quadrant abdominal pain. EXAM: CT ABDOMEN AND PELVIS WITH CONTRAST TECHNIQUE: Multidetector CT imaging of the abdomen and pelvis was performed using the standard protocol following bolus administration of intravenous contrast. CONTRAST:  46m OMNIPAQUE IOHEXOL 350 MG/ML SOLN COMPARISON:  CT July 22, 2020, MRI March 29, 2019 and CT December 30, 2015.  FINDINGS: Lower chest: Hypoventilatory change in the dependent lungs. Hepatobiliary: No suspicious hepatic lesion. Cholelithiasis without findings of acute cholecystitis. No biliary ductal dilation. Pancreas: No evidence of acute pancreatic inflammation or ductal dilation. Spleen: Within normal limits. Adrenals/Urinary Tract: Bilateral adrenal glands are unremarkable. No hydronephrosis. Subcentimeter left interpolar renal lesion measuring 6 mm on image 25/2 which is technically too small to accurately characterize but statistically likely represent a cyst. Mild wall thickening of an incompletely distended urinary bladder. Stomach/Bowel: Small hiatal hernia otherwise stomach is unremarkable for degree of distension. No pathologic dilation of small or large bowel. The appendix and terminal ileum appear normal. Small volume of formed stool throughout the colon. Scattered colonic diverticulosis without findings of acute diverticulitis. Vascular/Lymphatic: No abdominal aortic aneurysm. No pathologically enlarged abdominal or pelvic lymph nodes. Reproductive: Tampon in the vagina. Again seen is marked leiomyomatous enlargement of the uterus with the uterus measuring approximately 20.9 x 18.2 x 13.6 cm which is similar prior. There are calcified and noncalcified low myomas with the largest fibroid measuring approximately 13.6 cm. No suspicious adnexal masses. Other: No abdominopelvic ascites. No walled off fluid collections. No pneumoperitoneum. Musculoskeletal: No acute or suspicious osseous findings. Marked osteitis condensans ilii. IMPRESSION: 1. Mild wall thickening of an incompletely distended urinary bladder. Correlate with urinalysis to exclude cystitis. 2. Stable marked leiomyomatous enlargement of the uterus. 3. Normal appendix and terminal ileum. 4. Cholelithiasis without findings of acute cholecystitis. 5. Colonic diverticulosis without findings of acute diverticulitis. Electronically Signed   By: Dahlia Bailiff  M.D.   On: 10/10/2020 18:18    Procedures .Critical Care Performed by: Dorie Rank, MD Authorized by: Dorie Rank, MD   Critical care provider statement:    Critical care time (minutes):  30   Critical care was time spent personally by me on the following activities:  Discussions with consultants, evaluation of patient's response to treatment, examination of patient, ordering and performing treatments and interventions, ordering and review of laboratory studies, ordering and review of radiographic studies, pulse oximetry, re-evaluation of patient's condition, obtaining history from patient or surrogate and review of old charts   Medications Ordered in ED Medications  iohexol (OMNIPAQUE) 350 MG/ML injection 80 mL (80 mLs Intravenous Contrast Given 10/10/20 1753)  0.9 %  sodium chloride infusion (10 mL/hr Intravenous New Bag/Given 10/10/20 2208)    ED Course  I have reviewed the triage vital signs and the nursing notes.  Pertinent labs & imaging results that were available during my care of the patient were reviewed by me and considered in my medical decision making (see chart for details).  Clinical Course as of 10/10/20 2348  Thu Oct 10, 2020  1849 Hgb decreased to 6.9.  UA with many RBC [JK]  1850 CMET Normal.  Lipase normal [JK]  1850 CT scan shows :IMPRESSION: 1. Mild wall thickening of an incompletely distended urinary bladder. Correlate with urinalysis to exclude cystitis. 2. Stable marked leiomyomatous enlargement of the uterus. 3. Normal appendix and terminal ileum. 4. Cholelithiasis without findings of acute cholecystitis. 5. Colonic diverticulosis without findings of acute diverticulitis.   [JK]  1948 Discussed doing a blood transfusion with the patient.  I explained to her how her blood count has decreased again back to around the levels when she received her last blood transfusion.  Patient is deciding right now whether or not to have a blood transfusion [JK]    Clinical  Course User Index [JK] Dorie Rank, MD   MDM Rules/Calculators/A&P  Patient presented to the ED for evaluation of abdominal pain.  Patient was given Toradol at the urgent care.  Her symptoms have resolved by the time she was evaluated in the ED.  Patient's laboratory tests were notable for hematuria but she states she has had some recent menstrual bleeding.  Laboratory tests were notable for anemia.  Patient has low MCV.  Previous values reviewed and patient has a history of chronic blood loss anemia.  She last had transfusions in May when her hemoglobin was 6.  Patient did follow-up with GYN and several suggestions were made including hysterectomy, myomectomy, uterine artery embolization.  Patient does have persistent symptoms since that visit.  CT scan does not show any acute changes.  Patient has marked enlargement of her uterus consistent with physical exam findings.  Patient's not having any heavy bleeding at this time.  We will proceed with blood transfusion while she is in the ED.  Otherwise appears stable for discharge.  Stressed the importance of her following up with OB/GYN for definitive treatment of her uterine fibroids and abnormal uterine bleeding.  Final Clinical Impression(s) / ED Diagnoses Final diagnoses:  Iron deficiency anemia due to chronic blood loss  Uterine leiomyoma, unspecified location  Abnormal uterine bleeding     Dorie Rank, MD 10/10/20 2349

## 2020-10-11 LAB — TYPE AND SCREEN
ABO/RH(D): A POS
Antibody Screen: NEGATIVE
Unit division: 0

## 2020-10-11 LAB — CERVICOVAGINAL ANCILLARY ONLY
Bacterial Vaginitis (gardnerella): POSITIVE — AB
Candida Glabrata: NEGATIVE
Candida Vaginitis: NEGATIVE
Chlamydia: NEGATIVE
Comment: NEGATIVE
Comment: NEGATIVE
Comment: NEGATIVE
Comment: NEGATIVE
Comment: NEGATIVE
Comment: NORMAL
Neisseria Gonorrhea: NEGATIVE
Trichomonas: NEGATIVE

## 2020-10-11 LAB — BPAM RBC
Blood Product Expiration Date: 202210072359
ISSUE DATE / TIME: 202209082342
Unit Type and Rh: 6200

## 2020-10-11 NOTE — ED Notes (Signed)
Dr Tomi Bamberger gave verbal order to increase blood transfusion rate to 171m/hr.

## 2020-10-16 ENCOUNTER — Telehealth (HOSPITAL_COMMUNITY): Payer: Self-pay | Admitting: Emergency Medicine

## 2020-10-16 MED ORDER — METRONIDAZOLE 500 MG PO TABS: 500.0000 mg | ORAL_TABLET | Freq: Two times a day (BID) | ORAL | 0 refills | Status: DC

## 2020-11-07 ENCOUNTER — Ambulatory Visit: Payer: 59 | Admitting: Obstetrics and Gynecology

## 2021-06-06 ENCOUNTER — Encounter: Payer: Self-pay | Admitting: Family

## 2021-06-06 ENCOUNTER — Ambulatory Visit: Payer: BC Managed Care – PPO | Admitting: Family

## 2021-06-06 VITALS — BP 135/87 | HR 101 | Temp 98.1°F | Ht 64.0 in | Wt 191.4 lb

## 2021-06-06 DIAGNOSIS — F418 Other specified anxiety disorders: Secondary | ICD-10-CM

## 2021-06-06 DIAGNOSIS — F331 Major depressive disorder, recurrent, moderate: Secondary | ICD-10-CM | POA: Diagnosis not present

## 2021-06-06 DIAGNOSIS — N920 Excessive and frequent menstruation with regular cycle: Secondary | ICD-10-CM

## 2021-06-06 DIAGNOSIS — D500 Iron deficiency anemia secondary to blood loss (chronic): Secondary | ICD-10-CM | POA: Diagnosis not present

## 2021-06-06 LAB — CBC WITH DIFFERENTIAL/PLATELET
Absolute Monocytes: 260 cells/uL (ref 200–950)
Basophils Absolute: 60 cells/uL (ref 0–200)
Basophils Relative: 1.5 %
Eosinophils Absolute: 172 cells/uL (ref 15–500)
Eosinophils Relative: 4.3 %
HCT: 32.3 % — ABNORMAL LOW (ref 35.0–45.0)
Hemoglobin: 8.8 g/dL — ABNORMAL LOW (ref 11.7–15.5)
Lymphs Abs: 1960 cells/uL (ref 850–3900)
MCH: 19.3 pg — ABNORMAL LOW (ref 27.0–33.0)
MCHC: 27.2 g/dL — ABNORMAL LOW (ref 32.0–36.0)
MCV: 70.8 fL — ABNORMAL LOW (ref 80.0–100.0)
MPV: 10.4 fL (ref 7.5–12.5)
Monocytes Relative: 6.5 %
Neutro Abs: 1548 cells/uL (ref 1500–7800)
Neutrophils Relative %: 38.7 %
Platelets: 319 10*3/uL (ref 140–400)
RBC: 4.56 10*6/uL (ref 3.80–5.10)
RDW: 16.3 % — ABNORMAL HIGH (ref 11.0–15.0)
Total Lymphocyte: 49 %
WBC: 4 10*3/uL (ref 3.8–10.8)

## 2021-06-06 NOTE — Progress Notes (Signed)
? ?New Patient Office Visit ? ?Subjective:  ?Patient ID: [name], female    DOB: 11/22/1975  Age: 46 y.o. MRN: [id number] ? ?CC:  ?Chief Complaint  ?Patient presents with  ? Establish Care  ? Anemia  ?  She denies fatigue/weakness. Pt is requesting lab work.  ?  ? ? ?HPI ?[name] presents for establishing care today. ?Anemia: Patient presents for presents evaluation of anemia. Anemia was found by ER visit.  It has been present for 1 year.  Associated signs & symptoms: history of dizziness/lightheadedness, dyspnea, fatigue, menorrhagia, and pallor. pt reports she has been taking an OTC iron supplement daily, but has been more tired than usual lately. ?Anxiety/Depression: Patient complains of anxiety disorder and depression .   ?She has the following symptoms: fatigue, irritable, sadness, worry, feeling bad about herself, decreased interest .  ?Onset of symptoms was approximately  years ago, She denies current suicidal and homicidal ideation.  ?Possible organic causes contributing are: none.  ?Risk factors: previous episode of depression  ?Previous treatment includes  none  but has had therapy.  ?Menorrhagia: d/t fibroids, causing severe anemia last year requiring blood transfusions and iron infusions. pt reports her GYN left the practice and she has not been back, heard she has returned and she would like a referral. Also reports she was started on Megace which has helped, but she also has gained weight and would like to discuss options. ? ? ?  06/06/2021  ?  3:44 PM  ?Depression screen PHQ 2/9  ?Decreased Interest 3  ?Down, Depressed, Hopeless 3  ?PHQ - 2 Score 6  ?Altered sleeping 3  ?Tired, decreased energy 3  ?Change in appetite 3  ?Feeling bad or failure about yourself  3  ?Trouble concentrating 3  ?Moving slowly or fidgety/restless 3  ?Suicidal thoughts 1  ?PHQ-9 Score 25  ? ? ?  06/06/2021  ?  3:44 PM  ?GAD 7 : Generalized Anxiety Score  ?Nervous, Anxious, on Edge 3  ?Control/stop worrying 3  ?Worry  too much - different things 3  ?Trouble relaxing 3  ?Restless 3  ?Easily annoyed or irritable 3  ?Afraid - awful might happen 3  ?Total GAD 7 Score 21  ?Anxiety Difficulty Extremely difficult  ? ?Assessment & Plan:  ? ?Problem List Items Addressed This Visit   ? ?  ? Other  ? Iron deficiency anemia due to chronic blood loss - Primary  ?  Chronic -  d/t menorrhagia,  pt taking a vegetarian iron supplement thru [employer], 1 per day. pt feeling fatigue lately. Checking CBC today. ? ?  ?  ? Relevant Orders  ? CBC with Differential/Platelet  ? Menorrhagia with regular cycle  ?  Chronic - under better control with Megace, but pt has gained some weight. Referring back to her old GYN, Dr. Garwin Brothers, and advised to discuss other options with her, possibly Depo or ablation.  ? ?  ?  ? Relevant Orders  ? Ambulatory referral to Obstetrics / Gynecology  ? Mixed anxiety and depressive disorder  ?  Chronic - pt has had for years and has had counseling, but stopped going, never on any meds. Discussed options, referring to [facility] and will either start Effexor or Zoloft based on if her anemia is causing her fatigue. Would prefer she take Zoloft. F/U in 1 mo. ? ?  ?  ? Relevant Orders  ? Ambulatory referral to Psychology  ? ?Past Medical History:  ?Diagnosis Date  ?  Anemia   ? Asthma   ? Blood transfusion without reported diagnosis   ? Gestational diabetes   ? Uterine fibroid   ? ? ?Past Surgical History:  ?Procedure Laterality Date  ? arm surgery    ? CERVICAL CERCLAGE    ? CESAREAN SECTION    ? DILATION AND CURETTAGE OF UTERUS    ? TOOTH EXTRACTION    ? ? ?Objective:  ? ?Today's Vitals: BP 135/87 (BP Location: Left Arm, Patient Position: Sitting, Cuff Size: Large)   Pulse (!) 101   Temp 98.1 ?F (36.7 ?C) (Temporal)   Ht '5\' 4"'$  (1.626 m)   Wt 191 lb 6.4 oz (86.8 kg)   LMP  (LMP Unknown)   SpO2 98%   BMI 32.85 kg/m?  ? ?Physical Exam ?Vitals and nursing note reviewed.  ?Constitutional:   ?   Appearance: Normal appearance.   ?Cardiovascular:  ?   Rate and Rhythm: Normal rate and regular rhythm.  ?Pulmonary:  ?   Effort: Pulmonary effort is normal.  ?   Breath sounds: Normal breath sounds.  ?Musculoskeletal:     ?   General: Normal range of motion.  ?Skin: ?   General: Skin is warm and dry.  ?Neurological:  ?   Mental Status: She is alert.  ?Psychiatric:     ?   Mood and Affect: Mood normal.     ?   Behavior: Behavior normal.  ? ? ?Outpatient Encounter Medications as of 06/06/2021  ?Medication Sig  ? albuterol (PROVENTIL HFA;VENTOLIN HFA) 108 (90 Base) MCG/ACT inhaler Inhale 2 puffs into the lungs every 6 (six) hours as needed for wheezing or shortness of breath.  ? cetirizine (ZYRTEC ALLERGY) 10 MG tablet Take 1 tablet (10 mg total) by mouth daily.  ? fluticasone (FLONASE) 50 MCG/ACT nasal spray Place 1 spray into both nostrils in the morning and at bedtime.  ? Iron Polysacch Cmplx-B12-FA 150-0.025-1 MG CAPS Take 1 capsule by mouth daily.  ? megestrol (MEGACE) 40 MG tablet Take 1 tablet (40 mg total) by mouth 2 (two) times daily.  ? [DISCONTINUED] metroNIDAZOLE (FLAGYL) 500 MG tablet Take 1 tablet (500 mg total) by mouth 2 (two) times daily.  ? ?No facility-administered encounter medications on file as of 06/06/2021.  ? ? ?Follow-up: Return in about 4 weeks (around 07/04/2021) for depression.  ? ?Jeanie Sewer, NP ?

## 2021-06-06 NOTE — Assessment & Plan Note (Addendum)
Chronic -  d/t menorrhagia,  pt taking a vegetarian iron supplement thru [place], 1 per day. pt feeling fatigue lately. Checking CBC today. ?

## 2021-06-06 NOTE — Patient Instructions (Addendum)
Welcome to [facility]! It was a pleasure meeting you today. ? ?Go to the lab for blood work today.  I will review in 1-2 days and send a message via MyChart or we will call you. ?As discussed, I will wait to see how bad your anemia is before sending the antidepressant (Zoloft or Effexor)  as 1 can give you more energy than the other (Effexor), but if your anemia is not bad, then starting a medication (Zoloft) that lifts your mood, can also give you more energy. ?See the attached handout for more tips in handling your depression. ? ?Please schedule a 1 month follow up visit today or after you pick up the antidepressant medication. ? ? ? ?PLEASE NOTE: ? ?If you had any LAB tests please let us know if you have not heard back within a few days. You may see your results on MyChart before we have a chance to review them but we will give you a call once they are reviewed by [place]. If we ordered any REFERRALS today, please let us know if you have not heard from their office within the next week.  ?Let us know through MyChart if you are needing REFILLS, or have your pharmacy send [place] the request. You can also use MyChart to communicate with me or any office staff. ? ?Please try these tips to maintain a healthy lifestyle: ? ?Eat most of your calories during the day when you are active. Eliminate processed foods including packaged sweets (pies, cakes, cookies), reduce intake of potatoes, white bread, white pasta, and white rice. Look for whole grain options, oat flour or almond flour. ? ?Each meal should contain half fruits/vegetables, one quarter protein, and one quarter carbs (no bigger than a computer mouse). ? ?Cut down on sweet beverages. This includes juice, soda, and sweet tea. Also watch fruit intake, though this is a healthier sweet option, it still contains natural sugar! Limit to 3 servings daily. ? ?Drink at least 1 glass of water with each meal and aim for at least 8 glasses per  day ? ?Exercise at least 150 minutes every week.  ? ?

## 2021-06-06 NOTE — Assessment & Plan Note (Signed)
Chronic - under better control with Megace, but pt has gained some weight. Referring back to her old GYN, Dr. Garwin Brothers, and advised to discuss other options with her, possibly Depo or ablation.  ?

## 2021-06-06 NOTE — Assessment & Plan Note (Signed)
Chronic - pt has had for years and has had counseling, but stopped going, never on any meds. Discussed options, referring to [facility] and will either start Effexor or Zoloft based on if her anemia is causing her fatigue. Would prefer she take Zoloft. F/U in 1 mo. ?

## 2021-06-08 NOTE — Progress Notes (Signed)
Hey [name], ? ?This pt is [name]'s mother - [name]  is very familiar with her medical issues, but not sure if mom signed a HIPPA?  ?I'm going to refer [name] to Hematology, but I think she needs to go ahead & get an iron infusion as she has been taking oral iron- I heard we have an iron infusion protocol/orders the office uses? thx ?

## 2021-06-09 ENCOUNTER — Other Ambulatory Visit: Payer: Self-pay | Admitting: Family

## 2021-06-09 ENCOUNTER — Telehealth: Payer: Self-pay

## 2021-06-09 DIAGNOSIS — F418 Other specified anxiety disorders: Secondary | ICD-10-CM

## 2021-06-09 NOTE — Telephone Encounter (Signed)
error 

## 2021-06-09 NOTE — Addendum Note (Signed)
Addended by: Jerrel Ivory D on: 06/09/2021 11:57 AM ? ? Modules accepted: Orders ? ?

## 2021-06-09 NOTE — Addendum Note (Signed)
Addended byJeanie Sewer on: 06/09/2021 01:30 PM ? ? Modules accepted: Orders ? ?

## 2021-06-10 ENCOUNTER — Other Ambulatory Visit: Payer: Self-pay | Admitting: Family

## 2021-06-10 DIAGNOSIS — F418 Other specified anxiety disorders: Secondary | ICD-10-CM

## 2021-06-10 MED ORDER — SERTRALINE HCL 25 MG PO TABS: 25.0000 mg | ORAL_TABLET | ORAL | 0 refills | Status: DC

## 2021-06-11 NOTE — Addendum Note (Signed)
Addended byJeanie Sewer on: 06/11/2021 10:19 AM ? ? Modules accepted: Orders ? ?

## 2021-06-13 ENCOUNTER — Other Ambulatory Visit: Payer: Self-pay | Admitting: Family

## 2021-06-13 DIAGNOSIS — D649 Anemia, unspecified: Secondary | ICD-10-CM

## 2021-06-16 ENCOUNTER — Encounter: Payer: Self-pay | Admitting: Family

## 2021-06-16 ENCOUNTER — Inpatient Hospital Stay (HOSPITAL_BASED_OUTPATIENT_CLINIC_OR_DEPARTMENT_OTHER): Payer: BC Managed Care – PPO | Admitting: Family

## 2021-06-16 ENCOUNTER — Inpatient Hospital Stay: Payer: BC Managed Care – PPO | Attending: Family

## 2021-06-16 ENCOUNTER — Telehealth: Payer: Self-pay

## 2021-06-16 VITALS — BP 153/78 | HR 73 | Temp 98.3°F | Resp 20 | Ht 64.0 in | Wt 193.0 lb

## 2021-06-16 DIAGNOSIS — R5383 Other fatigue: Secondary | ICD-10-CM

## 2021-06-16 DIAGNOSIS — R42 Dizziness and giddiness: Secondary | ICD-10-CM

## 2021-06-16 DIAGNOSIS — Z803 Family history of malignant neoplasm of breast: Secondary | ICD-10-CM | POA: Diagnosis not present

## 2021-06-16 DIAGNOSIS — D500 Iron deficiency anemia secondary to blood loss (chronic): Secondary | ICD-10-CM | POA: Insufficient documentation

## 2021-06-16 DIAGNOSIS — D509 Iron deficiency anemia, unspecified: Secondary | ICD-10-CM

## 2021-06-16 DIAGNOSIS — D259 Leiomyoma of uterus, unspecified: Secondary | ICD-10-CM | POA: Diagnosis not present

## 2021-06-16 DIAGNOSIS — N920 Excessive and frequent menstruation with regular cycle: Secondary | ICD-10-CM | POA: Insufficient documentation

## 2021-06-16 DIAGNOSIS — F419 Anxiety disorder, unspecified: Secondary | ICD-10-CM

## 2021-06-16 DIAGNOSIS — R0789 Other chest pain: Secondary | ICD-10-CM

## 2021-06-16 DIAGNOSIS — D649 Anemia, unspecified: Secondary | ICD-10-CM

## 2021-06-16 DIAGNOSIS — R202 Paresthesia of skin: Secondary | ICD-10-CM

## 2021-06-16 DIAGNOSIS — Z86018 Personal history of other benign neoplasm: Secondary | ICD-10-CM | POA: Insufficient documentation

## 2021-06-16 DIAGNOSIS — Z832 Family history of diseases of the blood and blood-forming organs and certain disorders involving the immune mechanism: Secondary | ICD-10-CM

## 2021-06-16 DIAGNOSIS — R002 Palpitations: Secondary | ICD-10-CM

## 2021-06-16 DIAGNOSIS — Z8249 Family history of ischemic heart disease and other diseases of the circulatory system: Secondary | ICD-10-CM | POA: Diagnosis not present

## 2021-06-16 DIAGNOSIS — R200 Anesthesia of skin: Secondary | ICD-10-CM | POA: Diagnosis not present

## 2021-06-16 DIAGNOSIS — Z79899 Other long term (current) drug therapy: Secondary | ICD-10-CM | POA: Diagnosis not present

## 2021-06-16 LAB — CMP (CANCER CENTER ONLY)
ALT: 9 U/L (ref 0–44)
AST: 14 U/L — ABNORMAL LOW (ref 15–41)
Albumin: 4.3 g/dL (ref 3.5–5.0)
Alkaline Phosphatase: 56 U/L (ref 38–126)
Anion gap: 7 (ref 5–15)
BUN: 11 mg/dL (ref 6–20)
CO2: 27 mmol/L (ref 22–32)
Calcium: 9.7 mg/dL (ref 8.9–10.3)
Chloride: 105 mmol/L (ref 98–111)
Creatinine: 0.77 mg/dL (ref 0.44–1.00)
GFR, Estimated: >60 mL/min (ref >60)
Glucose, Bld: 99 mg/dL (ref 70–99)
Potassium: 3.9 mmol/L (ref 3.5–5.1)
Sodium: 139 mmol/L (ref 135–145)
Total Bilirubin: 1 mg/dL (ref 0.3–1.2)
Total Protein: 7.1 g/dL (ref 6.5–8.1)

## 2021-06-16 LAB — CBC WITH DIFFERENTIAL (CANCER CENTER ONLY)
Abs Immature Granulocytes: 0 10*3/uL (ref 0.00–0.07)
Basophils Absolute: 0.1 10*3/uL (ref 0.0–0.1)
Basophils Relative: 2 %
Eosinophils Absolute: 0.2 10*3/uL (ref 0.0–0.5)
Eosinophils Relative: 4 %
HCT: 34.1 % — ABNORMAL LOW (ref 36.0–46.0)
Hemoglobin: 9.3 g/dL — ABNORMAL LOW (ref 12.0–15.0)
Immature Granulocytes: 0 %
Lymphocytes Relative: 51 %
Lymphs Abs: 2 10*3/uL (ref 0.7–4.0)
MCH: 19.7 pg — ABNORMAL LOW (ref 26.0–34.0)
MCHC: 27.3 g/dL — ABNORMAL LOW (ref 30.0–36.0)
MCV: 72.2 fL — ABNORMAL LOW (ref 80.0–100.0)
Monocytes Absolute: 0.3 10*3/uL (ref 0.1–1.0)
Monocytes Relative: 7 %
Neutro Abs: 1.4 10*3/uL — ABNORMAL LOW (ref 1.7–7.7)
Neutrophils Relative %: 36 %
Platelet Count: 325 10*3/uL (ref 150–400)
RBC: 4.72 MIL/uL (ref 3.87–5.11)
RDW: 16.9 % — ABNORMAL HIGH (ref 11.5–15.5)
WBC Count: 3.9 10*3/uL — ABNORMAL LOW (ref 4.0–10.5)
nRBC: 0 % (ref 0.0–0.2)

## 2021-06-16 LAB — SAVE SMEAR(SSMR), FOR PROVIDER SLIDE REVIEW

## 2021-06-16 LAB — RETICULOCYTES
Immature Retic Fract: 22.8 % — ABNORMAL HIGH (ref 2.3–15.9)
RBC.: 4.7 MIL/uL (ref 3.87–5.11)
Retic Count, Absolute: 88.4 10*3/uL (ref 19.0–186.0)
Retic Ct Pct: 1.9 % (ref 0.4–3.1)

## 2021-06-16 LAB — LACTATE DEHYDROGENASE: LDH: 204 U/L — ABNORMAL HIGH (ref 98–192)

## 2021-06-16 LAB — FERRITIN: Ferritin: 5 ng/mL — ABNORMAL LOW (ref 11–307)

## 2021-06-16 NOTE — Telephone Encounter (Signed)
error 

## 2021-06-16 NOTE — Progress Notes (Signed)
Hematology/Oncology Consultation  ? ?Name: [name]      MRN: [id number]    Location: Room/bed info not found  Date: 06/16/2021 Time:3:02 PM ? ? ?REFERRING PHYSICIAN:  Jeanie Sewer, NP ? ?REASON FOR CONSULT: Iron deficiency anemia due to chronic blood loss ?  ?DIAGNOSIS: Iron deficiency anemia secondary to heavy cycle with uterine fibroids ? ?HISTORY OF PRESENT ILLNESS: [name] is a very pleasant 46 yo African American female with history of iron deficiency anemia secondary to heavy cycles. She has history of uterine fibroids.  ?Her cycle is irregular not always occurring monthly and spotting at times. She has been taking Megace which seems to have helped lighten her flow. She has an appointment with gynecology next month to discuss other treatment options.  ?No other blood loss noted. No abnormal bruising or petechiae.  ?She is symptomatic with fatigue, ice and cold drink cravings, SOB and occasional lightheadedness with exertion.  ?She states that her mother and her side of the family have strong history of anemia.  ?She thinks that her maternal second cousin has sickle cell disease.  ?She notes palpitations and chest tightness with episodes of anxiety.  ?She has increased her intake of iron rich foods. She is also taking an oral iron supplement daily.  ?She has 2 daughters. She did have a miscarriage at around 12 weeks. She also went into labor with twins at 24 weeks and unfortunately their lungs were not developed enough to survive.  ?No personal history of cancer. Family history includes: brother with prostate, maternal aunt with breast, maternal grandmother with lung (smoker) and maternal uncles with stomach.  ?No fever, chills, n/v, cough, rash, chest pain, abdominal pain or changes in bowel or bladder habits.  ?No history of diabetes or thyroid disease.  ?No swelling or tenderness in her extremities.  ?She notes positional numbness and tingling in her hands and feet that comes and goes.  ?No  falls or syncope to report.  ?No smoking, ETOH or recreational drug abuse.  ?Appetite and hydration are good. Her weight is 193 lbs.  ? ?ROS: All other 10 point review of systems is negative.  ? ?PAST MEDICAL HISTORY:   ?Past Medical History:  ?Diagnosis Date  ? Anemia   ? Asthma   ? Blood transfusion without reported diagnosis   ? Gestational diabetes   ? Uterine fibroid   ? ? ?ALLERGIES: ?Allergies  ?Allergen Reactions  ? Shellfish Allergy Itching and Swelling  ?  Throat swells   ? Latex   ? Other Itching and Swelling  ?  Hazelnut  ? ?   ?MEDICATIONS:  ?Current Outpatient Medications on File Prior to Visit  ?Medication Sig Dispense Refill  ? albuterol (PROVENTIL HFA;VENTOLIN HFA) 108 (90 Base) MCG/ACT inhaler Inhale 2 puffs into the lungs every 6 (six) hours as needed for wheezing or shortness of breath.    ? fluticasone (FLONASE) 50 MCG/ACT nasal spray Place 1 spray into both nostrils in the morning and at bedtime. 16 g 2  ? Iron Polysacch Cmplx-B12-FA 150-0.025-1 MG CAPS Take 1 capsule by mouth daily. 30 capsule 11  ? megestrol (MEGACE) 40 MG tablet Take 1 tablet (40 mg total) by mouth 2 (two) times daily. 180 tablet 3  ? sertraline (ZOLOFT) 25 MG tablet Take 1 tablet (25 mg total) by mouth as directed. Start with one pill daily for 2 weeks, then increase to 2 pills if no improvement in symptoms.OK to take at bedtime if causes drowsiness. 60 tablet 0  ?  cetirizine (ZYRTEC ALLERGY) 10 MG tablet Take 1 tablet (10 mg total) by mouth daily. (Patient not taking: Reported on 06/16/2021) 30 tablet 2  ? ?No current facility-administered medications on file prior to visit.  ? ?  ?PAST SURGICAL HISTORY ?Past Surgical History:  ?Procedure Laterality Date  ? arm surgery    ? CERVICAL CERCLAGE    ? CESAREAN SECTION    ? DILATION AND CURETTAGE OF UTERUS    ? TOOTH EXTRACTION    ? ? ?FAMILY HISTORY: ?Family History  ?Problem Relation Age of Onset  ? Hypertension Mother   ? Hypertension Brother   ? Breast cancer Maternal Aunt    ? Hypertension Maternal Uncle   ? ? ?SOCIAL HISTORY: ? reports that she has never smoked. She has never used smokeless tobacco. She reports that she does not drink alcohol and does not use drugs. ? ?PERFORMANCE STATUS: ?The patient's performance status is 1 - Symptomatic but completely ambulatory ? ?PHYSICAL EXAM: ?Most Recent Vital Signs: There were no vitals taken for this visit. ?BP (!) 153/78 (BP Location: Right Arm, Patient Position: Sitting)   Pulse 73   Temp 98.3 ?F (36.8 ?C) (Oral)   Resp 20   Ht '5\' 4"'$  (1.626 m)   Wt 193 lb (87.5 kg)   LMP  (LMP Unknown)   SpO2 100%   BMI 33.13 kg/m?  ? ?General Appearance:    Alert, cooperative, no distress, appears stated age  ?Head:    Normocephalic, without obvious abnormality, atraumatic  ?Eyes:    PERRL, conjunctiva/corneas clear, EOM's intact, fundi  ?  benign, both eyes  ?Ears:    Normal TM's and external ear canals, both ears  ?Nose:   Nares normal, septum midline, mucosa normal, no drainage    or sinus tenderness  ?Throat:   Lips, mucosa, and tongue normal; teeth and gums normal  ?Neck:   Supple, symmetrical, trachea midline, no adenopathy;  ?  thyroid:  no enlargement/tenderness/nodules; no carotid ?  bruit or JVD  ?Back:     Symmetric, no curvature, ROM normal, no CVA tenderness  ?Lungs:     Clear to auscultation bilaterally, respirations unlabored  ?Chest Wall:    No tenderness or deformity  ? Heart:    Regular rate and rhythm, S1 and S2 normal, no murmur, rub   or gallop  ?Breast Exam:    No tenderness, masses, or nipple abnormality  ?Abdomen:     Soft, non-tender, bowel sounds active all four quadrants,  ?  no masses, no organomegaly  ?Genitalia:    Normal female without lesion, discharge or tenderness  ?Rectal:    Normal tone, normal prostate, no masses or tenderness; ?  guaiac negative stool  ?Extremities:   Extremities normal, atraumatic, no cyanosis or edema  ?Pulses:   2+ and symmetric all extremities  ?Skin:   Skin color, texture, turgor normal,  no rashes or lesions  ?Lymph nodes:   Cervical, supraclavicular, and axillary nodes normal  ?Neurologic:   CNII-XII intact, normal strength, sensation and reflexes  ?  throughout  ? ? ?LABORATORY DATA:  ?Results for orders placed or performed in visit on 06/16/21 (from the past 48 hour(s))  ?CBC with Differential (Cancer Center Only)     Status: Abnormal  ? Collection Time: 06/16/21  2:42 PM  ?Result Value Ref Range  ? WBC Count 3.9 (L) 4.0 - 10.5 K/uL  ? RBC 4.72 3.87 - 5.11 MIL/uL  ? Hemoglobin 9.3 (L) 12.0 - 15.0 g/dL  ?  Comment: Reticulocyte Hemoglobin testing ?may be clinically indicated, ?consider ordering this additional ?test [id number] ?  ? HCT 34.1 (L) 36.0 - 46.0 %  ? MCV 72.2 (L) 80.0 - 100.0 fL  ? MCH 19.7 (L) 26.0 - 34.0 pg  ? MCHC 27.3 (L) 30.0 - 36.0 g/dL  ? RDW 16.9 (H) 11.5 - 15.5 %  ? Platelet Count 325 150 - 400 K/uL  ? nRBC 0.0 0.0 - 0.2 %  ? Neutrophils Relative % 36 %  ? Neutro Abs 1.4 (L) 1.7 - 7.7 K/uL  ? Lymphocytes Relative 51 %  ? Lymphs Abs 2.0 0.7 - 4.0 K/uL  ? Monocytes Relative 7 %  ? Monocytes Absolute 0.3 0.1 - 1.0 K/uL  ? Eosinophils Relative 4 %  ? Eosinophils Absolute 0.2 0.0 - 0.5 K/uL  ? Basophils Relative 2 %  ? Basophils Absolute 0.1 0.0 - 0.1 K/uL  ? Immature Granulocytes 0 %  ? Abs Immature Granulocytes 0.00 0.00 - 0.07 K/uL  ?  Comment: Performed at [facility] at [facility], [address] [place], [place] [address]  ?Reticulocytes     Status: Abnormal  ? Collection Time: 06/16/21  2:42 PM  ?Result Value Ref Range  ? Retic Ct Pct 1.9 0.4 - 3.1 %  ? RBC. 4.70 3.87 - 5.11 MIL/uL  ? Retic Count, Absolute 88.4 19.0 - 186.0 K/uL  ? Immature Retic Fract 22.8 (H) 2.3 - 15.9 %  ?  Comment: Performed at [facility] at [facility], [address] [place], [place] [address]  ?Save [facility])     Status: None  ? Collection Time: 06/16/21  2:42 PM  ?Result Value Ref Range  ? Smear Review SMEAR STAINED AND AVAILABLE FOR REVIEW    ?  Comment: Performed at [facility] Lab at [facility], [address] [place], [place] [address]  ?   ? ?RADIOGRAPHY: ?No results found.   ?  ?PATHOLOGY: None ? ?ASSESSMENT/PLAN: Ms.

## 2021-06-17 ENCOUNTER — Encounter: Payer: Self-pay | Admitting: Family

## 2021-06-17 ENCOUNTER — Other Ambulatory Visit: Payer: Self-pay | Admitting: Family

## 2021-06-17 LAB — IRON AND IRON BINDING CAPACITY (CC-WL,HP ONLY)
Iron: 60 ug/dL (ref 28–170)
Saturation Ratios: 14 % (ref 10.4–31.8)
TIBC: 434 ug/dL (ref 250–450)
UIBC: 374 ug/dL (ref 148–442)

## 2021-06-17 LAB — ERYTHROPOIETIN: Erythropoietin: 181.3 m[IU]/mL — ABNORMAL HIGH (ref 2.6–18.5)

## 2021-06-18 ENCOUNTER — Telehealth: Payer: Self-pay | Admitting: *Deleted

## 2021-06-18 LAB — HGB FRACTIONATION CASCADE
Hgb A2: 2.1 % (ref 1.8–3.2)
Hgb A: 97.2 % (ref 96.4–98.8)
Hgb F: 0.7 % (ref 0.0–2.0)
Hgb S: 0 %

## 2021-06-18 NOTE — Telephone Encounter (Signed)
Per 06/16/21 los - called patient to schedule (3) doses of IV Iron - patient to call back with dates and times that are acceptable. ?

## 2021-06-23 ENCOUNTER — Inpatient Hospital Stay: Payer: BC Managed Care – PPO

## 2021-06-23 VITALS — BP 122/72 | HR 66 | Temp 98.5°F | Resp 16

## 2021-06-23 DIAGNOSIS — Z832 Family history of diseases of the blood and blood-forming organs and certain disorders involving the immune mechanism: Secondary | ICD-10-CM | POA: Diagnosis not present

## 2021-06-23 DIAGNOSIS — R42 Dizziness and giddiness: Secondary | ICD-10-CM | POA: Diagnosis not present

## 2021-06-23 DIAGNOSIS — R002 Palpitations: Secondary | ICD-10-CM | POA: Diagnosis not present

## 2021-06-23 DIAGNOSIS — Z8249 Family history of ischemic heart disease and other diseases of the circulatory system: Secondary | ICD-10-CM | POA: Diagnosis not present

## 2021-06-23 DIAGNOSIS — D500 Iron deficiency anemia secondary to blood loss (chronic): Secondary | ICD-10-CM

## 2021-06-23 DIAGNOSIS — R200 Anesthesia of skin: Secondary | ICD-10-CM | POA: Diagnosis not present

## 2021-06-23 DIAGNOSIS — F419 Anxiety disorder, unspecified: Secondary | ICD-10-CM | POA: Diagnosis not present

## 2021-06-23 DIAGNOSIS — Z86018 Personal history of other benign neoplasm: Secondary | ICD-10-CM | POA: Diagnosis not present

## 2021-06-23 DIAGNOSIS — R202 Paresthesia of skin: Secondary | ICD-10-CM | POA: Diagnosis not present

## 2021-06-23 DIAGNOSIS — D259 Leiomyoma of uterus, unspecified: Secondary | ICD-10-CM | POA: Diagnosis not present

## 2021-06-23 DIAGNOSIS — N920 Excessive and frequent menstruation with regular cycle: Secondary | ICD-10-CM | POA: Diagnosis not present

## 2021-06-23 DIAGNOSIS — R0789 Other chest pain: Secondary | ICD-10-CM | POA: Diagnosis not present

## 2021-06-23 DIAGNOSIS — Z803 Family history of malignant neoplasm of breast: Secondary | ICD-10-CM | POA: Diagnosis not present

## 2021-06-23 DIAGNOSIS — Z79899 Other long term (current) drug therapy: Secondary | ICD-10-CM | POA: Diagnosis not present

## 2021-06-23 DIAGNOSIS — R5383 Other fatigue: Secondary | ICD-10-CM | POA: Diagnosis not present

## 2021-06-23 MED ORDER — SODIUM CHLORIDE 0.9 % IV SOLN
300.0000 mg | Freq: Once | INTRAVENOUS | Status: AC
  Administered 2021-06-23: 300 mg via INTRAVENOUS
  Filled 2021-06-23: qty 300

## 2021-06-23 MED ORDER — SODIUM CHLORIDE 0.9 % IV SOLN: Freq: Once | INTRAVENOUS | Status: AC

## 2021-06-23 NOTE — Patient Instructions (Signed)

## 2021-06-26 ENCOUNTER — Encounter: Payer: Self-pay | Admitting: Obstetrics and Gynecology

## 2021-06-26 ENCOUNTER — Ambulatory Visit: Payer: BC Managed Care – PPO | Admitting: Obstetrics and Gynecology

## 2021-06-26 ENCOUNTER — Other Ambulatory Visit (HOSPITAL_COMMUNITY)
Admission: RE | Admit: 2021-06-26 | Discharge: 2021-06-26 | Disposition: A | Discharge status: Home / Self Care | Payer: BC Managed Care – PPO | Source: Ambulatory Visit | Attending: Obstetrics and Gynecology | Admitting: Obstetrics and Gynecology

## 2021-06-26 VITALS — BP 155/103 | HR 81 | Resp 16 | Ht 64.0 in | Wt 192.0 lb

## 2021-06-26 DIAGNOSIS — D259 Leiomyoma of uterus, unspecified: Secondary | ICD-10-CM | POA: Diagnosis not present

## 2021-06-26 DIAGNOSIS — Z124 Encounter for screening for malignant neoplasm of cervix: Secondary | ICD-10-CM | POA: Diagnosis not present

## 2021-06-26 DIAGNOSIS — D500 Iron deficiency anemia secondary to blood loss (chronic): Secondary | ICD-10-CM

## 2021-06-26 DIAGNOSIS — N920 Excessive and frequent menstruation with regular cycle: Secondary | ICD-10-CM | POA: Diagnosis not present

## 2021-06-26 LAB — ALPHA-THALASSEMIA GENOTYPR

## 2021-06-26 LAB — HM PAP SMEAR: HM Pap smear: NORMAL

## 2021-06-26 MED ORDER — MYFEMBREE 40-1-0.5 MG PO TABS: 1.0000 | ORAL_TABLET | Freq: Every day | ORAL | 3 refills | Status: DC

## 2021-06-26 NOTE — Progress Notes (Signed)
GYNECOLOGY OFFICE VISIT NOTE  History:   [name] is a 46 y.o. [phone] here today for consultation regarding her fibroids and the symptoms associated. She has pressure/compression symptoms as well as heavy vaginal bleeding causing iron deficiency anemia such that she has received blood transfusions. She has had these fibroids for many years but has put it off out of some degree of concern about interventions. She has anxiety about having surgery. Her pressure symptoms have entailed abdominal pain, pelvic pain, inability to eat regular meals.   Her bleeding is currently less but she is also taking Megace 40 BID. She is still bleeding despite the Megace.   Her anemia is improving but this is with IV infusions with hematology in addition to the Megace.  Related to the anemia she does also have symptoms of fatigue and SOB.   She previously was seeing Dr. Garwin Brothers. Within the last year, Dr. Garwin Brothers did an EMB. The patient is unsure of when her last pap smear was.   Her daughter is with her and contributing to the history taking.     Past Medical History:  Diagnosis Date   Anemia    Asthma    Blood transfusion without reported diagnosis    Gestational diabetes    Uterine fibroid     Past Surgical History:  Procedure Laterality Date   arm surgery     CERVICAL CERCLAGE     CESAREAN SECTION     DILATION AND CURETTAGE OF UTERUS     TOOTH EXTRACTION      The following portions of the patient's history were reviewed and updated as appropriate: allergies, current medications, past family history, past medical history, past social history, past surgical history and problem list.   Health Maintenance:   Unsure of last pap - obtained today. Last on file is 2017 and was normal.   Review of Systems:  Pertinent items noted in HPI and remainder of comprehensive ROS otherwise negative.  Physical Exam:  BP (!) 155/103   Pulse 81   Resp 16   Ht '5\' 4"'$  (1.626 m)   Wt 192 lb (87.1 kg)    LMP  (LMP Unknown) Comment: Taking Megace  BMI 32.96 kg/m  CONSTITUTIONAL: Well-developed, well-nourished female in no acute distress.  HEENT:  Normocephalic, atraumatic. External right and left ear normal. No scleral icterus.  NECK: Normal range of motion, supple, no masses noted on observation SKIN: No rash noted. Not diaphoretic. No erythema. No pallor. MUSCULOSKELETAL: Normal range of motion. No edema noted. NEUROLOGIC: Alert and oriented to person, place, and time. Normal muscle tone coordination. No cranial nerve deficit noted. PSYCHIATRIC: Normal mood and affect. Normal behavior. Normal judgment and thought content.  CARDIOVASCULAR: Normal heart rate noted RESPIRATORY: Effort and breath sounds normal, no problems with respiration noted ABDOMEN: Uterus fundus is currently 7-8 cm above her umbilicus  PELVIC: Normal appearing external genitalia; normal urethral meatus; normal appearing vaginal mucosa and cervix.  No abnormal discharge noted but bleeding noted. Bimanual not performed.  Performed in the presence of a chaperone  Labs and Imaging No results found for this or any previous visit (from the past 168 hour(s)). No results found.  Assessment and Plan:   1. Uterine leiomyoma, unspecified location - Uterine fibroids: The patient's fibroids are symptomatic and treatment options of expectant management, medical therapy, and surgical therapy were discussed. - Expectant management - The patient's fibroids were discussed and expectant management was offered - TXA - we could add this in  with the Myfembree if amenorrhea is incomplete with it.  - Progesterone only options were not specifically discussed (she is already on Megace and has incomplete response). They will not help her bulk symptoms.  - We discussed the GnRH-agonists and antagonists. Reviewed both short term and long term impact of these medications. We discussed this is a helpful adjunct prior to surgery or in use alone. We  discussed starting this medication now may help reduce the size of the fibroids which would decrease surgery time, perhaps change incision type, and reduce blood loss at the time of surgery.  - We discussed surgical/procedural options available: RFA (I.e. Sonata), [place], myomectomy and hysterectomy. I told her I would not recommend RFA or myomectomy. For [place], recommended preop MRI and referral to interventional radiology for consultation. I recommended doing this in the interim to gather more information regarding her options.  - Discussed hysterectomy - reviewed at this time based on size of her uterus, I would recommend a vertical skin incision. We would remove her tubes but leave her ovaries so she would not yet go into menopause. Reviewed risks of surgery and the recovery following the procedure.  - Following counseling, the patient would like to schedule a TAH for 3 months from now, pursue more information regarding [place] and start Myfembree. She may yet change her mind regarding surgery but would like to have a date scheduled as she may likely proceed. We also will meet again in 3 months to review all options again.  - All questions answered.    - MR PELVIS W WO CONTRAST; Future - Ambulatory referral to Interventional Radiology - Relugolix-Estradiol-Norethind (MYFEMBREE) 40-1-0.5 MG TABS; Take 1 tablet by mouth daily.  Dispense: 28 tablet; Refill: 3  2. Menorrhagia with regular cycle - Will obtain EMB from Dr. Harvie Bridge office. She has no risk factors for endometrial cancer at this time - her BMI is likely <30 without the fibroids.    3. Iron deficiency anemia due to chronic blood loss - Continue management with Hematology. We discussed the importance of preoperative improvement of her hemoglobin for her safety and reduction of likelihood of need for transfusion at the time of surgery. We also discussed it helps with the surgery/recovery overall as well.   4. Screening for cervical cancer -  Cytology - PAP( [facility])   Routine preventative health maintenance measures emphasized. Please refer to After Visit Summary for other counseling recommendations.   Return in about 3 months (around 09/26/2021) for Follow up.  Radene Gunning, MD, [place] for [facility], [place]

## 2021-06-27 ENCOUNTER — Encounter: Payer: Self-pay | Admitting: Family

## 2021-07-01 ENCOUNTER — Inpatient Hospital Stay: Payer: BC Managed Care – PPO

## 2021-07-01 VITALS — BP 127/79 | HR 70 | Temp 98.7°F | Resp 18

## 2021-07-01 DIAGNOSIS — R200 Anesthesia of skin: Secondary | ICD-10-CM | POA: Diagnosis not present

## 2021-07-01 DIAGNOSIS — R5383 Other fatigue: Secondary | ICD-10-CM | POA: Diagnosis not present

## 2021-07-01 DIAGNOSIS — Z86018 Personal history of other benign neoplasm: Secondary | ICD-10-CM | POA: Diagnosis not present

## 2021-07-01 DIAGNOSIS — R42 Dizziness and giddiness: Secondary | ICD-10-CM | POA: Diagnosis not present

## 2021-07-01 DIAGNOSIS — D500 Iron deficiency anemia secondary to blood loss (chronic): Secondary | ICD-10-CM | POA: Diagnosis not present

## 2021-07-01 DIAGNOSIS — Z79899 Other long term (current) drug therapy: Secondary | ICD-10-CM | POA: Diagnosis not present

## 2021-07-01 DIAGNOSIS — R002 Palpitations: Secondary | ICD-10-CM | POA: Diagnosis not present

## 2021-07-01 DIAGNOSIS — N920 Excessive and frequent menstruation with regular cycle: Secondary | ICD-10-CM

## 2021-07-01 DIAGNOSIS — D259 Leiomyoma of uterus, unspecified: Secondary | ICD-10-CM | POA: Diagnosis not present

## 2021-07-01 DIAGNOSIS — R202 Paresthesia of skin: Secondary | ICD-10-CM | POA: Diagnosis not present

## 2021-07-01 DIAGNOSIS — R0789 Other chest pain: Secondary | ICD-10-CM | POA: Diagnosis not present

## 2021-07-01 DIAGNOSIS — F419 Anxiety disorder, unspecified: Secondary | ICD-10-CM | POA: Diagnosis not present

## 2021-07-01 DIAGNOSIS — Z803 Family history of malignant neoplasm of breast: Secondary | ICD-10-CM | POA: Diagnosis not present

## 2021-07-01 DIAGNOSIS — Z832 Family history of diseases of the blood and blood-forming organs and certain disorders involving the immune mechanism: Secondary | ICD-10-CM | POA: Diagnosis not present

## 2021-07-01 DIAGNOSIS — Z8249 Family history of ischemic heart disease and other diseases of the circulatory system: Secondary | ICD-10-CM | POA: Diagnosis not present

## 2021-07-01 LAB — CYTOLOGY - PAP
Comment: NEGATIVE
Diagnosis: NEGATIVE
Diagnosis: REACTIVE
High risk HPV: NEGATIVE

## 2021-07-01 MED ORDER — SODIUM CHLORIDE 0.9 % IV SOLN
300.0000 mg | Freq: Once | INTRAVENOUS | Status: AC
  Administered 2021-07-01: 300 mg via INTRAVENOUS
  Filled 2021-07-01: qty 300

## 2021-07-01 MED ORDER — SODIUM CHLORIDE 0.9 % IV SOLN: Freq: Once | INTRAVENOUS | Status: AC

## 2021-07-01 NOTE — Patient Instructions (Signed)

## 2021-07-03 ENCOUNTER — Other Ambulatory Visit: Payer: Self-pay | Admitting: Family

## 2021-07-03 DIAGNOSIS — F418 Other specified anxiety disorders: Secondary | ICD-10-CM

## 2021-07-03 NOTE — Telephone Encounter (Signed)
ok, will talk with her then, need to know if she went up to 2 pills or stayed at 1 daily. Thx

## 2021-07-04 ENCOUNTER — Ambulatory Visit: Payer: BC Managed Care – PPO | Admitting: Family

## 2021-07-04 ENCOUNTER — Encounter: Payer: Self-pay | Admitting: Family

## 2021-07-04 VITALS — BP 116/75 | HR 80 | Temp 98.2°F | Ht 62.0 in | Wt 189.1 lb

## 2021-07-04 DIAGNOSIS — J309 Allergic rhinitis, unspecified: Secondary | ICD-10-CM | POA: Diagnosis not present

## 2021-07-04 DIAGNOSIS — F418 Other specified anxiety disorders: Secondary | ICD-10-CM | POA: Diagnosis not present

## 2021-07-04 DIAGNOSIS — D500 Iron deficiency anemia secondary to blood loss (chronic): Secondary | ICD-10-CM | POA: Diagnosis not present

## 2021-07-04 DIAGNOSIS — D259 Leiomyoma of uterus, unspecified: Secondary | ICD-10-CM

## 2021-07-04 MED ORDER — FLUTICASONE PROPIONATE 50 MCG/ACT NA SUSP: 1.0000 | Freq: Two times a day (BID) | NASAL | 2 refills | Status: DC

## 2021-07-04 MED ORDER — SERTRALINE HCL 50 MG PO TABS: 50.0000 mg | ORAL_TABLET | Freq: Every day | ORAL | 3 refills | Status: DC

## 2021-07-04 MED ORDER — CETIRIZINE HCL 10 MG PO TABS: 10.0000 mg | ORAL_TABLET | Freq: Every day | ORAL | 5 refills | Status: DC

## 2021-07-04 NOTE — Assessment & Plan Note (Signed)
Chronic - seen by Hematology, has had 2 iron infusions already, going for 3rd next week, starting to get her energy back.

## 2021-07-04 NOTE — Assessment & Plan Note (Signed)
Chronic - seen by GYN  and was given options to get rid of her fibroids. However, d/t the number and large size, her best option is a partial hysterectomy removing her cervix & uterus, leaving ovaries. Would require vertical abdominal incision due to fibroids size. Pt very hesitant to have surgery, discussed pros & cons of delaying surgery, answered questions, will research safety data to provide more reassurance.

## 2021-07-04 NOTE — Assessment & Plan Note (Signed)
Chronic - started Zoloft at last visit. Pt reports drowsiness, taking at bedtime, no change noted yet in anxiety. Increasing dose to '50mg'$  qhs. f/u in 3 mos.

## 2021-07-04 NOTE — Progress Notes (Signed)
Subjective:     Patient ID: [name], female    DOB: 1975/12/16, 46 y.o.   MRN: [id number]  Chief Complaint  Patient presents with   Follow-up    4 week follow-up for anxiety and depression and to discuss OBGYN visit    HPI: Anemia: Patient presents for presents evaluation of anemia. Anemia was found by ER visit.  It has been present for 1 year.  Associated signs & symptoms: history of dizziness/lightheadedness, dyspnea, fatigue, menorrhagia, and pallor. pt reports she has been seen by Hematology, has had 2 iron infusions already, going for 3rd next week, starting to get her energy back. Anxiety/Depression: Patient complains of anxiety disorder and depression.   She has the following symptoms: fatigue, irritable, sadness, worry, feeling bad about herself, decreased interest. Onset of symptoms was approximately  years ago, She denies current suicidal and homicidal ideation. Possible organic causes contributing are: none. Risk factors: previous episode of depression  Previous treatment includes starting Zoloft at last visit. Pt reports drowsiness, taking at bedtime, no change noted yet in anxiety. Menorrhagia: d/t fibroids, causing severe anemia last year requiring blood transfusions and iron infusions.  She was started on Megace which has helped, but she also has gained weight, currently anemic again. Referred to GYN last visit and was given options to get rid of her fibroids. However, d/t the number and large size, her best option is a partial hysterectomy removing her cervix & uterus, leaving ovaries. Would require vertical abdominal incision due to fibroids size. Pt very hesitant to have surgery.  Assessment & Plan:   Problem List Items Addressed This Visit       Respiratory   Allergic rhinitis   Relevant Medications   fluticasone (FLONASE) 50 MCG/ACT nasal spray   cetirizine (ZYRTEC ALLERGY) 10 MG tablet     Genitourinary   Fibroid uterus    Chronic - seen by GYN  and was  given options to get rid of her fibroids. However, d/t the number and large size, her best option is a partial hysterectomy removing her cervix & uterus, leaving ovaries. Would require vertical abdominal incision due to fibroids size. Pt very hesitant to have surgery, discussed pros & cons of delaying surgery, answered questions, will research safety data to provide more reassurance.         Other   Iron deficiency anemia due to chronic blood loss    Chronic - seen by Hematology, has had 2 iron infusions already, going for 3rd next week, starting to get her energy back.       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 Pt reports drowsiness, taking at bedtime, no change noted yet in anxiety. Increasing dose to '50mg'$  qhs. f/u in 3 mos.        Relevant Medications   sertraline (ZOLOFT) 50 MG tablet    Outpatient Medications Prior to Visit  Medication Sig Dispense Refill   albuterol (PROVENTIL HFA;VENTOLIN HFA) 108 (90 Base) MCG/ACT inhaler Inhale 2 puffs into the lungs every 6 (six) hours as needed for wheezing or shortness of breath.     Iron Polysacch Cmplx-B12-FA 150-0.025-1 MG CAPS Take 1 capsule by mouth daily. 30 capsule 11   megestrol (MEGACE) 40 MG tablet Take 1 tablet (40 mg total) by mouth 2 (two) times daily. 180 tablet 3   Relugolix-Estradiol-Norethind (MYFEMBREE) 40-1-0.5 MG TABS Take 1 tablet by mouth daily. 28 tablet 3   cetirizine (ZYRTEC  ALLERGY) 10 MG tablet Take 1 tablet (10 mg total) by mouth daily. 30 tablet 2   fluticasone (FLONASE) 50 MCG/ACT nasal spray Place 1 spray into both nostrils in the morning and at bedtime. 16 g 2   sertraline (ZOLOFT) 25 MG tablet Take 1 tablet (25 mg total) by mouth as directed. Start with one pill daily for 2 weeks, then increase to 2 pills if no improvement in symptoms.OK to take at bedtime if causes drowsiness. 60 tablet 0   No facility-administered medications prior to visit.    Past  Medical History:  Diagnosis Date   Anemia    Asthma    Blood transfusion without reported diagnosis    Gestational diabetes    Uterine fibroid     Past Surgical History:  Procedure Laterality Date   arm surgery     CERVICAL CERCLAGE     CESAREAN SECTION     DILATION AND CURETTAGE OF UTERUS     TOOTH EXTRACTION      Allergies  Allergen Reactions   Shellfish Allergy Itching and Swelling    Throat swells    Latex    Other Itching and Swelling    Hazelnut       Objective:    Physical Exam Vitals and nursing note reviewed.  Constitutional:      Appearance: Normal appearance.  Cardiovascular:     Rate and Rhythm: Normal rate and regular rhythm.  Pulmonary:     Effort: Pulmonary effort is normal.     Breath sounds: Normal breath sounds.  Abdominal:     General: Abdomen is protuberant.  Musculoskeletal:        General: Normal range of motion.  Skin:    General: Skin is warm and dry.  Neurological:     Mental Status: She is alert.  Psychiatric:        Mood and Affect: Mood normal.        Behavior: Behavior normal.    BP 116/75   Pulse 80   Temp 98.2 F (36.8 C) (Temporal)   Ht '5\' 2"'$  (1.575 m)   Wt 189 lb 2 oz (85.8 kg)   LMP 07/02/2021 (Approximate) Comment: Taking Megace  SpO2 99%   BMI 34.59 kg/m  Wt Readings from Last 3 Encounters:  07/04/21 189 lb 2 oz (85.8 kg)  06/26/21 192 lb (87.1 kg)  06/16/21 193 lb (87.5 kg)        Meds ordered this encounter  Medications   sertraline (ZOLOFT) 50 MG tablet    Sig: Take 1 tablet (50 mg total) by mouth daily.    Dispense:  30 tablet    Refill:  3    Order Specific Question:   Supervising Provider    Answer:   ANDY, CAMILLE L [[id number]]   fluticasone (FLONASE) 50 MCG/ACT nasal spray    Sig: Place 1 spray into both nostrils in the morning and at bedtime.    Dispense:  16 g    Refill:  2    Order Specific Question:   Supervising Provider    Answer:   ANDY, CAMILLE L [[id number]]   cetirizine (ZYRTEC ALLERGY) 10  MG tablet    Sig: Take 1 tablet (10 mg total) by mouth daily.    Dispense:  30 tablet    Refill:  5    Order Specific Question:   Supervising Provider    Answer:   ANDY, CAMILLE L [[id number]]    Jeanie Sewer, NP

## 2021-07-04 NOTE — Patient Instructions (Addendum)
It was very nice to see you today!   I have sent a refill for you Zoloft, Cetirizine, and Flonase.  I will research some articles for you to read regarding safety with hysterectomy procedures for fibroids.  Follow up in 3 mos or sooner if needed.  Have a wonderful weekend!     PLEASE NOTE:  If you had any lab tests please let us know if you have not heard back within a few days. You may see your results on MyChart before we have a chance to review them but we will give you a call once they are reviewed by [place]. If we ordered any referrals today, please let us know if you have not heard from their office within the next week.

## 2021-07-07 ENCOUNTER — Inpatient Hospital Stay: Payer: BC Managed Care – PPO | Attending: Family

## 2021-07-07 VITALS — BP 110/64 | HR 81 | Temp 99.1°F | Resp 18

## 2021-07-07 DIAGNOSIS — D500 Iron deficiency anemia secondary to blood loss (chronic): Secondary | ICD-10-CM | POA: Diagnosis not present

## 2021-07-07 DIAGNOSIS — Z79899 Other long term (current) drug therapy: Secondary | ICD-10-CM | POA: Insufficient documentation

## 2021-07-07 DIAGNOSIS — N920 Excessive and frequent menstruation with regular cycle: Secondary | ICD-10-CM | POA: Insufficient documentation

## 2021-07-07 MED ORDER — SODIUM CHLORIDE 0.9 % IV SOLN
300.0000 mg | Freq: Once | INTRAVENOUS | Status: AC
  Administered 2021-07-07: 300 mg via INTRAVENOUS
  Filled 2021-07-07: qty 300

## 2021-07-07 MED ORDER — SODIUM CHLORIDE 0.9 % IV SOLN: Freq: Once | INTRAVENOUS | Status: AC

## 2021-07-07 NOTE — Patient Instructions (Signed)

## 2021-07-18 ENCOUNTER — Encounter: Payer: Self-pay | Admitting: Family Medicine

## 2021-07-18 ENCOUNTER — Ambulatory Visit (INDEPENDENT_AMBULATORY_CARE_PROVIDER_SITE_OTHER): Payer: BC Managed Care – PPO | Admitting: Family Medicine

## 2021-07-18 VITALS — BP 130/78 | HR 97 | Temp 97.2°F | Ht 62.0 in | Wt 191.5 lb

## 2021-07-18 DIAGNOSIS — F418 Other specified anxiety disorders: Secondary | ICD-10-CM | POA: Diagnosis not present

## 2021-07-18 MED ORDER — BUSPIRONE HCL 5 MG PO TABS: 5.0000 mg | ORAL_TABLET | Freq: Two times a day (BID) | ORAL | 0 refills | Status: DC

## 2021-07-18 MED ORDER — ESCITALOPRAM OXALATE 10 MG PO TABS: 10.0000 mg | ORAL_TABLET | Freq: Every day | ORAL | 0 refills | Status: DC

## 2021-07-18 NOTE — Patient Instructions (Addendum)
Change to lexapro-can try 1/2 twice daily.   Stop zoloft  Buspar-1 at night for 2-3 nights then twice daily

## 2021-07-18 NOTE — Progress Notes (Signed)
Subjective:     Patient ID: [name], female    DOB: [birth year], 46 y.o.   MRN: [id number]  Chief Complaint  Patient presents with   Consult    Discuss obgyn appointment    HPI-here w/dau [name] for hyst d/t fibroids.  Anxious but always anxious On zoloft for 1 mo.  Making drowsy-takes at night.  Was tired before started on zoloft but worse since started zoloft-sleeps all the time.   worries a lot.  No SI.  Has depression as well but not bad.  Stressed.  H/o panic and social anxiety.   Never had tx w/meds in past until recently.  Health Maintenance Due  Topic Date Due   Hepatitis C Screening  Never done   COLONOSCOPY (Pts 45-56yr Insurance coverage will need to be confirmed)  Never done    Past Medical History:  Diagnosis Date   Anemia    Asthma    Blood transfusion without reported diagnosis    Gestational diabetes    Uterine fibroid     Past Surgical History:  Procedure Laterality Date   arm surgery     CERVICAL CERCLAGE     CESAREAN SECTION     DILATION AND CURETTAGE OF UTERUS     TOOTH EXTRACTION      Outpatient Medications Prior to Visit  Medication Sig Dispense Refill   albuterol (PROVENTIL HFA;VENTOLIN HFA) 108 (90 Base) MCG/ACT inhaler Inhale 2 puffs into the lungs every 6 (six) hours as needed for wheezing or shortness of breath.     cetirizine (ZYRTEC ALLERGY) 10 MG tablet Take 1 tablet (10 mg total) by mouth daily. 30 tablet 5   fluticasone (FLONASE) 50 MCG/ACT nasal spray Place 1 spray into both nostrils in the morning and at bedtime. 16 g 2   Iron Polysacch Cmplx-B12-FA 150-0.025-1 MG CAPS Take 1 capsule by mouth daily. 30 capsule 11   megestrol (MEGACE) 40 MG tablet Take 1 tablet (40 mg total) by mouth 2 (two) times daily. 180 tablet 3   Relugolix-Estradiol-Norethind (MYFEMBREE) 40-1-0.5 MG TABS Take 1 tablet by mouth daily. 28 tablet 3   sertraline (ZOLOFT) 50 MG tablet Take 1 tablet (50 mg total) by mouth daily. 30 tablet 3   No  facility-administered medications prior to visit.    Allergies  Allergen Reactions   Shellfish Allergy Itching and Swelling    Throat swells    Latex    Other Itching and Swelling    Hazelnut   ROS neg/noncontributory except as noted HPI/below      Objective:     BP 130/78   Pulse 97   Temp (!) 97.2 F (36.2 C) (Temporal)   Ht '5\' 2"'$  (1.575 m)   Wt 191 lb 8 oz (86.9 kg)   LMP 07/02/2021 (Approximate) Comment: Taking Megace  SpO2 98%   BMI 35.03 kg/m  Wt Readings from Last 3 Encounters:  07/18/21 191 lb 8 oz (86.9 kg)  07/04/21 189 lb 2 oz (85.8 kg)  06/26/21 192 lb (87.1 kg)    Physical Exam   Gen: WDWN NAD HEENT: NCAT, conjunctiva not injected, sclera nonicteric MSK: no gross abnormalities.  NEURO: A&O x3.  CN II-XII intact.  PSYCH: normal mood. Good eye contact     Assessment & Plan:   Problem List Items Addressed This Visit       Other   Mixed anxiety and depressive disorder - Primary   Relevant Medications   busPIRone (BUSPAR) 5 MG tablet  escitalopram (LEXAPRO) 10 MG tablet   Mixed anxiety and depression-more unmanagable d/t anemia, Bray for hyst.  Has been on zoloft, but sleeps all the time and not controlled.  Will change to lexapro'10mg'$ (take 1/2 tab bid) and add buspar '5mg'$  bid to see if better anxiety control and less SE.  Some of fatigue is the anemia so hyst is the way to resolve that.  Discussed that she will feel better once recovered.   F/u w/[facility] as planned.   Meds ordered this encounter  Medications   busPIRone (BUSPAR) 5 MG tablet    Sig: Take 1 tablet (5 mg total) by mouth 2 (two) times daily.    Dispense:  60 tablet    Refill:  0   escitalopram (LEXAPRO) 10 MG tablet    Sig: Take 1 tablet (10 mg total) by mouth daily.    Dispense:  30 tablet    Refill:  0    Wellington Hampshire, MD

## 2021-07-28 ENCOUNTER — Encounter: Payer: Medicaid Other | Admitting: Obstetrics and Gynecology

## 2021-08-04 ENCOUNTER — Encounter: Payer: Self-pay | Admitting: Family

## 2021-08-04 ENCOUNTER — Ambulatory Visit: Payer: Medicaid Other | Admitting: Obstetrics and Gynecology

## 2021-08-06 ENCOUNTER — Encounter: Payer: Self-pay | Admitting: *Deleted

## 2021-08-07 ENCOUNTER — Telehealth: Payer: Self-pay | Admitting: *Deleted

## 2021-08-07 NOTE — Telephone Encounter (Signed)
Called and lvm about rescheduling appointments - requested callback to confirm.

## 2021-08-11 ENCOUNTER — Ambulatory Visit (INDEPENDENT_AMBULATORY_CARE_PROVIDER_SITE_OTHER): Payer: BC Managed Care – PPO

## 2021-08-11 DIAGNOSIS — D251 Intramural leiomyoma of uterus: Secondary | ICD-10-CM | POA: Diagnosis not present

## 2021-08-11 DIAGNOSIS — D259 Leiomyoma of uterus, unspecified: Secondary | ICD-10-CM | POA: Diagnosis not present

## 2021-08-11 DIAGNOSIS — N920 Excessive and frequent menstruation with regular cycle: Secondary | ICD-10-CM | POA: Diagnosis not present

## 2021-08-11 DIAGNOSIS — D500 Iron deficiency anemia secondary to blood loss (chronic): Secondary | ICD-10-CM | POA: Diagnosis not present

## 2021-08-11 DIAGNOSIS — N852 Hypertrophy of uterus: Secondary | ICD-10-CM | POA: Diagnosis not present

## 2021-08-11 DIAGNOSIS — D252 Subserosal leiomyoma of uterus: Secondary | ICD-10-CM | POA: Diagnosis not present

## 2021-08-11 MED ORDER — GADOBUTROL 1 MMOL/ML IV SOLN
9.0000 mL | Freq: Once | INTRAVENOUS | Status: AC | PRN
  Administered 2021-08-11: 9 mL via INTRAVENOUS

## 2021-08-13 ENCOUNTER — Other Ambulatory Visit: Payer: Self-pay | Admitting: Obstetrics and Gynecology

## 2021-08-13 DIAGNOSIS — D259 Leiomyoma of uterus, unspecified: Secondary | ICD-10-CM

## 2021-08-14 ENCOUNTER — Telehealth: Payer: Self-pay

## 2021-08-14 ENCOUNTER — Encounter: Payer: Self-pay | Admitting: Family

## 2021-08-14 ENCOUNTER — Inpatient Hospital Stay (HOSPITAL_BASED_OUTPATIENT_CLINIC_OR_DEPARTMENT_OTHER): Payer: BC Managed Care – PPO | Admitting: Family

## 2021-08-14 ENCOUNTER — Inpatient Hospital Stay: Payer: BC Managed Care – PPO | Attending: Family

## 2021-08-14 VITALS — BP 124/71 | HR 86 | Temp 98.0°F | Resp 17 | Ht 62.0 in | Wt 194.8 lb

## 2021-08-14 DIAGNOSIS — D509 Iron deficiency anemia, unspecified: Secondary | ICD-10-CM

## 2021-08-14 DIAGNOSIS — D500 Iron deficiency anemia secondary to blood loss (chronic): Secondary | ICD-10-CM | POA: Insufficient documentation

## 2021-08-14 DIAGNOSIS — R5383 Other fatigue: Secondary | ICD-10-CM | POA: Insufficient documentation

## 2021-08-14 DIAGNOSIS — N920 Excessive and frequent menstruation with regular cycle: Secondary | ICD-10-CM | POA: Diagnosis not present

## 2021-08-14 DIAGNOSIS — Z79899 Other long term (current) drug therapy: Secondary | ICD-10-CM | POA: Diagnosis not present

## 2021-08-14 DIAGNOSIS — D259 Leiomyoma of uterus, unspecified: Secondary | ICD-10-CM | POA: Diagnosis not present

## 2021-08-14 DIAGNOSIS — J45901 Unspecified asthma with (acute) exacerbation: Secondary | ICD-10-CM | POA: Diagnosis not present

## 2021-08-14 LAB — CBC WITH DIFFERENTIAL (CANCER CENTER ONLY)
Abs Immature Granulocytes: 0.01 10*3/uL (ref 0.00–0.07)
Basophils Absolute: 0.1 10*3/uL (ref 0.0–0.1)
Basophils Relative: 2 %
Eosinophils Absolute: 0.1 10*3/uL (ref 0.0–0.5)
Eosinophils Relative: 4 %
HCT: 35.7 % — ABNORMAL LOW (ref 36.0–46.0)
Hemoglobin: 10.7 g/dL — ABNORMAL LOW (ref 12.0–15.0)
Immature Granulocytes: 0 %
Lymphocytes Relative: 40 %
Lymphs Abs: 1.6 10*3/uL (ref 0.7–4.0)
MCH: 24.9 pg — ABNORMAL LOW (ref 26.0–34.0)
MCHC: 30 g/dL (ref 30.0–36.0)
MCV: 83.2 fL (ref 80.0–100.0)
Monocytes Absolute: 0.3 10*3/uL (ref 0.1–1.0)
Monocytes Relative: 7 %
Neutro Abs: 1.9 10*3/uL (ref 1.7–7.7)
Neutrophils Relative %: 47 %
Platelet Count: 272 10*3/uL (ref 150–400)
RBC: 4.29 MIL/uL (ref 3.87–5.11)
RDW: 21.3 % — ABNORMAL HIGH (ref 11.5–15.5)
WBC Count: 4 10*3/uL (ref 4.0–10.5)
nRBC: 0 % (ref 0.0–0.2)

## 2021-08-14 LAB — RETICULOCYTES
Immature Retic Fract: 25.1 % — ABNORMAL HIGH (ref 2.3–15.9)
RBC.: 4.3 MIL/uL (ref 3.87–5.11)
Retic Count, Absolute: 133.3 10*3/uL (ref 19.0–186.0)
Retic Ct Pct: 3.1 % (ref 0.4–3.1)

## 2021-08-14 LAB — FERRITIN: Ferritin: 23 ng/mL (ref 11–307)

## 2021-08-14 MED ORDER — FUSION PLUS PO CAPS: 1.0000 | ORAL_CAPSULE | Freq: Every day | ORAL | 11 refills | Status: DC

## 2021-08-14 NOTE — Telephone Encounter (Signed)
Pt was asking for  a prior auth for the meds that she was speaking to [facility] about she said they have filled it but need [place] to do a release for it

## 2021-08-14 NOTE — Progress Notes (Signed)
Hematology and Oncology Follow Up Visit  [name] [id number] 02/23/1975 46 y.o. 08/14/2021   Principle Diagnosis:  Iron deficiency anemia  Current Therapy:   IV iron as indicated   Interim History:  [name] is here today with her mother for follow-up. She is symptomatic with fatigue.  She has intermittent SOB with asthma exacerbation.  Her cycle is regular but flow is quite heavy.  She started a oral birth control to help control her flow but is having issues with her insurance and getting PA for the second month.  She is scheduled to have a hysterectomy on 10/08/2021.  No other blood loss noted. No bruising or petechiae.  No fever, chills, n/v, cough, rash, dizziness, chest pain, palpitations, abdominal pain or changes in bowel or bladder habits.  No swelling, tenderness, numbness or tingling in her extremities.  No falls or syncope.  Appetite and hydration are good. Her weight is stable at 194 lbs.   ECOG Performance Status: 1 - Symptomatic but completely ambulatory  Medications:  Allergies as of 08/14/2021       Reactions   Shellfish Allergy Itching, Swelling   Throat swells    Latex    Other Itching, Swelling   Hazelnut        Medication List        Accurate as of August 14, 2021  6:23 PM. If you have any questions, ask your nurse or doctor.          STOP taking these medications    megestrol 40 MG tablet Commonly known as: MEGACE Stopped by: Lottie Dawson, NP       TAKE these medications    albuterol 108 (90 Base) MCG/ACT inhaler Commonly known as: VENTOLIN HFA Inhale 2 puffs into the lungs every 6 (six) hours as needed for wheezing or shortness of breath.   busPIRone 5 MG tablet Commonly known as: BUSPAR Take 1 tablet (5 mg total) by mouth 2 (two) times daily.   cetirizine 10 MG tablet Commonly known as: ZyrTEC Allergy Take 1 tablet (10 mg total) by mouth daily.   escitalopram 10 MG tablet Commonly known as: Lexapro Take 1 tablet (10  mg total) by mouth daily.   fluticasone 50 MCG/ACT nasal spray Commonly known as: FLONASE Place 1 spray into both nostrils in the morning and at bedtime.   Iron Polysacch Cmplx-B12-FA 150-0.025-1 MG Caps Take 1 capsule by mouth daily.   Myfembree 40-1-0.5 MG Tabs Generic drug: Relugolix-Estradiol-Norethind Take 1 tablet by mouth daily.        Allergies:  Allergies  Allergen Reactions   Shellfish Allergy Itching and Swelling    Throat swells    Latex    Other Itching and Swelling    Hazelnut    Past Medical History, Surgical history, Social history, and Family History were reviewed and updated.  Review of Systems: All other 10 point review of systems is negative.   Physical Exam:  height is '5\' 2"'$  (1.575 m) and weight is 194 lb 12.8 oz (88.4 kg). Her temperature is 98 F (36.7 C). Her blood pressure is 124/71 and her pulse is 86. Her respiration is 17 and oxygen saturation is 100%.   Wt Readings from Last 3 Encounters:  08/14/21 194 lb 12.8 oz (88.4 kg)  07/18/21 191 lb 8 oz (86.9 kg)  07/04/21 189 lb 2 oz (85.8 kg)    Ocular: Sclerae unicteric, pupils equal, round and reactive to light Ear-nose-throat: Oropharynx clear, dentition fair Lymphatic: No cervical  or supraclavicular adenopathy Lungs no rales or rhonchi, good excursion bilaterally Heart regular rate and rhythm, no murmur appreciated Abd soft, nontender, positive bowel sounds MSK no focal spinal tenderness, no joint edema Neuro: non-focal, well-oriented, appropriate affect Breasts: Deferred   Lab Results  Component Value Date   WBC 4.0 08/14/2021   HGB 10.7 (L) 08/14/2021   HCT 35.7 (L) 08/14/2021   MCV 83.2 08/14/2021   PLT 272 08/14/2021   Lab Results  Component Value Date   FERRITIN 23 08/14/2021   IRON 60 06/16/2021   TIBC 434 06/16/2021   UIBC 374 06/16/2021   IRONPCTSAT 14 06/16/2021   Lab Results  Component Value Date   RETICCTPCT 3.1 08/14/2021   RBC 4.30 08/14/2021   RETICCTABS  138.14 (H) 05/27/2015   No results found for: "KPAFRELGTCHN", "LAMBDASER", "KAPLAMBRATIO" No results found for: "IGGSERUM", "IGA", "IGMSERUM" No results found for: "TOTALPROTELP", "ALBUMINELP", "A1GS", "A2GS", "BETS", "BETA2SER", "GAMS", "MSPIKE", "SPEI"   Chemistry      Component Value Date/Time   NA 139 06/16/2021 1442   NA 141 05/27/2015 1441   K 3.9 06/16/2021 1442   K 4.0 05/27/2015 1441   CL 105 06/16/2021 1442   CO2 27 06/16/2021 1442   CO2 29 05/27/2015 1441   BUN 11 06/16/2021 1442   BUN 9.9 05/27/2015 1441   CREATININE 0.77 06/16/2021 1442   CREATININE 0.8 05/27/2015 1441      Component Value Date/Time   CALCIUM 9.7 06/16/2021 1442   CALCIUM 9.8 05/27/2015 1441   ALKPHOS 56 06/16/2021 1442   ALKPHOS 44 05/27/2015 1441   AST 14 (L) 06/16/2021 1442   AST 17 05/27/2015 1441   ALT 9 06/16/2021 1442   ALT 11 05/27/2015 1441   BILITOT 1.0 06/16/2021 1442   BILITOT 0.76 05/27/2015 1441       Impression and Plan: [name] is a very pleasant 46 yo African American female with history of iron deficiency anemia secondary to heavy cycles with uterine fibroids.  She is scheduled for hysterectomy the first week of September.  Iron studies are pending.  She would also like to take an oral iron supplement so she will try daily Fusion plus.  Follow-up in 3 months.   Lottie Dawson, NP 7/13/20236:23 PM

## 2021-08-15 LAB — IRON AND IRON BINDING CAPACITY (CC-WL,HP ONLY)
Iron: 80 ug/dL (ref 28–170)
Saturation Ratios: 26 % (ref 10.4–31.8)
TIBC: 312 ug/dL (ref 250–450)
UIBC: 232 ug/dL (ref 148–442)

## 2021-08-18 ENCOUNTER — Other Ambulatory Visit: Payer: Self-pay | Admitting: Family Medicine

## 2021-08-18 ENCOUNTER — Ambulatory Visit: Payer: Medicaid Other | Admitting: Family

## 2021-08-18 ENCOUNTER — Other Ambulatory Visit: Payer: Medicaid Other

## 2021-08-19 ENCOUNTER — Telehealth: Payer: Self-pay | Admitting: *Deleted

## 2021-08-19 DIAGNOSIS — N920 Excessive and frequent menstruation with regular cycle: Secondary | ICD-10-CM

## 2021-08-19 DIAGNOSIS — D500 Iron deficiency anemia secondary to blood loss (chronic): Secondary | ICD-10-CM

## 2021-08-19 DIAGNOSIS — D259 Leiomyoma of uterus, unspecified: Secondary | ICD-10-CM

## 2021-08-19 MED ORDER — MYFEMBREE 40-1-0.5 MG PO TABS: 1.0000 | ORAL_TABLET | Freq: Every day | ORAL | 3 refills | Status: DC

## 2021-08-19 NOTE — Telephone Encounter (Signed)
Pt having a hard time getting Myfembree from her current pharmacy so she wished to switch to [facility] on [address].

## 2021-08-21 ENCOUNTER — Encounter: Payer: Self-pay | Admitting: Family

## 2021-08-21 ENCOUNTER — Encounter: Payer: Self-pay | Admitting: Obstetrics and Gynecology

## 2021-08-21 ENCOUNTER — Ambulatory Visit: Payer: BC Managed Care – PPO | Admitting: Obstetrics and Gynecology

## 2021-08-21 VITALS — BP 122/79 | HR 79 | Resp 16 | Ht 64.0 in | Wt 194.0 lb

## 2021-08-21 DIAGNOSIS — D259 Leiomyoma of uterus, unspecified: Secondary | ICD-10-CM | POA: Diagnosis not present

## 2021-08-21 NOTE — Progress Notes (Signed)
GYNECOLOGY OFFICE VISIT NOTE  History:   KRISTAL PERL is a 46 y.o. [phone] here today for discussion regarding the place for [place] in the setting of possible hysterectomy. She had her MRI and had an appt with radiology but ended up canceling it because the plan had been for the Myfembree but then insurance gave a lot of difficulty with this. She did take it for one month.   She continues to have pain. She has SOB. She also has heavy bleeding. She is doing IV iron to help with her anemia which continues to imrove (nadir was HGB in the 6's and last check was 10.7).    She denies any abnormal vaginal discharge, bleeding, pelvic pain or other concerns.     Past Medical History:  Diagnosis Date   Anemia    Asthma    Blood transfusion without reported diagnosis    Gestational diabetes    Uterine fibroid     Past Surgical History:  Procedure Laterality Date   arm surgery     CERVICAL CERCLAGE     CESAREAN SECTION     DILATION AND CURETTAGE OF UTERUS     TOOTH EXTRACTION      The following portions of the patient's history were reviewed and updated as appropriate: allergies, current medications, past family history, past medical history, past social history, past surgical history and problem list.   Health Maintenance:   Diagnosis  Date Value Ref Range Status  06/26/2021   Final   - Negative for Intraepithelial Lesions or Malignancy (NILM)  06/26/2021 - Benign reactive/reparative changes  Final    Review of Systems:  Pertinent items noted in HPI and remainder of comprehensive ROS otherwise negative.  Physical Exam:  BP 122/79   Pulse 79   Resp 16   Ht '5\' 4"'$  (1.626 m)   Wt 194 lb (88 kg)   BMI 33.30 kg/m  CONSTITUTIONAL: Well-developed, well-nourished female in no acute distress.  HEENT:  Normocephalic, atraumatic. External right and left ear normal. No scleral icterus.  NECK: Normal range of motion, supple, no masses noted on observation SKIN: No rash noted. Not  diaphoretic. No erythema. No pallor. MUSCULOSKELETAL: Normal range of motion. No edema noted. NEUROLOGIC: Alert and oriented to person, place, and time. Normal muscle tone coordination. No cranial nerve deficit noted. PSYCHIATRIC: Normal mood and affect. Normal behavior. Normal judgment and thought content.  CARDIOVASCULAR: Normal heart rate noted RESPIRATORY: Effort and breath sounds normal, no problems with respiration noted  PELVIC: Deferred  Labs and Imaging No results found for this or any previous visit (from the past 168 hour(s)). MR PELVIS W WO CONTRAST  Result Date: 08/11/2021 CLINICAL DATA:  Uterine fibroids.  Surgical planning. EXAM: MRI PELVIS WITHOUT AND WITH CONTRAST TECHNIQUE: Multiplanar multisequence MR imaging of the pelvis was performed both before and after administration of intravenous contrast. CONTRAST:  72m GADAVIST GADOBUTROL 1 MMOL/ML IV SOLN COMPARISON:  03/29/2019 FINDINGS: Lower Urinary Tract: No bladder or urethral abnormality identified. Bowel:  Unremarkable visualized pelvic bowel loops. Vascular/Lymphatic: No pathologically enlarged lymph nodes or other significant abnormality. Reproductive: -- Uterus: Measures 22.3 by 12.3 x 18.7 cm (volume = 2690 cm^3), minimally increased compared to 2,030 cm3 on prior exam. Numerous fibroids are again seen involving the uterus diffusely. The largest fibroid is located in the right uterine corpus with a prominent intracavitary component, measuring 16.1 x 7.8 by 12.7 cm, without significant change in size. Multiple other smaller intramural and subserosal fibroids are again seen,  largest in the left lateral corpus measuring 4.1 cm, also without significant change. No restricted diffusion seen. -- Intracavitary fibroids: Stable 16 cm right central uterine fibroid which has a prominent intracavitary component, without significant change since prior study. -- Pedunculated fibroids: None. -- Fibroid contrast enhancement: Many fibroids  including the dominant intracavitary fibroid show diffuse contrast enhancement. There are multiple small intramural and subserosal fibroids which show lack enhancement, consistent with diffuse degeneration. These findings also show no significant change compared to prior study. -- Right ovary:  Appears normal.  No mass identified. -- Left ovary:  Appears normal.  No mass identified. Other: No abnormal free fluid. Musculoskeletal:  Unremarkable. IMPRESSION: Markedly enlarged uterus, with diffuse uterine involvement by fibroids, as described above. Largest in the right uterine corpus measures 16 cm and has a prominent intracavitary component. No significant change noted compared to prior exam. No pedunculated fibroids identified. Normal appearance of both ovaries. No adnexal mass identified. Electronically Signed   By: Marlaine Hind M.D.   On: 08/11/2021 17:04     2007: Uterus was 15 cm, dominant fibroid was 5 cm 2016: Uterus was 17 cm, dominant fibroid was 8.5 cm 2020: Uterus was 19 cm, dominant fibroid was 17cm 2023: Uterus was 22 cm, dominant fibroid is 16 cm  Assessment and Plan:   1. Uterine leiomyoma, unspecified location - Dominant fibroid has not grown since last MRI but overall uterine size has as more fibroids have developed.  - Overall, we discussed the the role of [place] in her surgery. Discussed that while the [place] is unlikely to help her avoid surgery (although not impossible), it will help her bleeding prior to the surgery and may aid in changing the type of incision from a vertical incision to a transverse incision. It will also help reduce blood loss at the time of surgery, and it may also help reduce blood loss between now and when her surgery is scheduled for September.  - Reviewed that I do think it is worth at least doing the consultation to the Radiologist's opinion on the matter  - Discussed if she starts to notice improvement in her symptoms, it is also possible to push back the surgery  to allow more time for [place] effect.  - Reviewed I would recommend saving questions for the radiologist for exact risks of procedure, symptoms after the procedure and the efficacy.  - The patient was present with her 2 daughters and all questions were answered.  - She would like to do the consultation. We will contact radiology and schedule asap for her.   Routine preventative health maintenance measures emphasized. Please refer to After Visit Summary for other counseling recommendations.   No follow-ups on file.  Radene Gunning, MD, [place] [facility], [place]

## 2021-08-26 ENCOUNTER — Encounter: Payer: Self-pay | Admitting: Family

## 2021-09-02 ENCOUNTER — Encounter: Payer: Self-pay | Admitting: *Deleted

## 2021-09-04 ENCOUNTER — Ambulatory Visit
Admission: RE | Admit: 2021-09-04 | Discharge: 2021-09-04 | Disposition: A | Discharge status: Home / Self Care | Payer: Medicaid Other | Source: Ambulatory Visit | Attending: Obstetrics and Gynecology | Admitting: Obstetrics and Gynecology

## 2021-09-04 ENCOUNTER — Encounter: Payer: Self-pay | Admitting: *Deleted

## 2021-09-04 DIAGNOSIS — D259 Leiomyoma of uterus, unspecified: Secondary | ICD-10-CM

## 2021-09-04 DIAGNOSIS — N920 Excessive and frequent menstruation with regular cycle: Secondary | ICD-10-CM | POA: Diagnosis not present

## 2021-09-04 HISTORY — PX: IR RADIOLOGIST EVAL & MGMT: IMG5224

## 2021-09-04 NOTE — Consult Note (Signed)
Chief Complaint: Patient was seen in consultation today for symptomatic uterine fibroids  at the request of [place]  Referring Physician(s): Duncan,Paula  History of Present Illness: [name] is a 46 y.o. female with known uterine fibroids and history of heavy menstrual bleeding with cramping during her menstrual periods.  Patient has chronic iron deficiency anemia and has received blood transfusions in the past.  Pregnancy history is G4, P2.  Patient has recently seen Dr. Damita Dunnings in gynecology and the patient is on the schedule for a hysterectomy.  Patient was referred to interventional radiology to discuss uterine artery embolization for primary treatment of the symptomatic fibroid and also to discuss performing a uterine artery embolization prior to a hysterectomy.  Patient was accompanied by her younger daughter, her mother and her older daughter was on the cellular phone.  The patient's mother was doing the majority of the talking and it was difficult to get specifics from the patient herself.  It appears that the patient's main complaint is the heavy bleeding.  She says that her menstrual periods last 7 to 8 days and there is usually 2 days of heavy bleeding.  She says that the symptoms have gotten worse over the past few years.  She complains of pelvic cramping associated with her menstrual periods. Patient has been on Megace in the past but it does not sound like she is taking any hormonal therapy currently.  It sounds like the patient is currently taking her Lexapro but according to the older daughter she is not on the Buspar right now.  Patient had a negative Pap smear on 06/26/2021.  Patient had a pelvic MRI on 08/11/2021.  Patient has had urinary frequency for most of her life.  She does not complain of constipation.  According to the patient's older daughter, the patient spends a lot of time in bed and has low energy.  Patient is not currently working.  Past Medical History:   Diagnosis Date   Anemia    Asthma    Blood transfusion without reported diagnosis    Gestational diabetes    Uterine fibroid     Past Surgical History:  Procedure Laterality Date   arm surgery     CERVICAL CERCLAGE     CESAREAN SECTION     DILATION AND CURETTAGE OF UTERUS     IR RADIOLOGIST EVAL & MGMT  09/04/2021   TOOTH EXTRACTION      Allergies: Shellfish allergy, Latex, and Other  Medications: Prior to Admission medications   Medication Sig Start Date End Date Taking? Authorizing Provider  albuterol (PROVENTIL HFA;VENTOLIN HFA) 108 (90 Base) MCG/ACT inhaler Inhale 2 puffs into the lungs every 6 (six) hours as needed for wheezing or shortness of breath.    [provider]  busPIRone (BUSPAR) 5 MG tablet Take 1 tablet (5 mg total) by mouth 2 (two) times daily. 07/18/21   Tawnya Crook, MD  cetirizine (ZYRTEC ALLERGY) 10 MG tablet Take 1 tablet (10 mg total) by mouth daily. 07/04/21   Jeanie Sewer, NP  escitalopram (LEXAPRO) 10 MG tablet TAKE 1 TABLET BY MOUTH EVERY DAY 08/18/21   Jeanie Sewer, NP  fluticasone (FLONASE) 50 MCG/ACT nasal spray Place 1 spray into both nostrils in the morning and at bedtime. 07/04/21   Jeanie Sewer, NP  Iron-FA-B Cmp-C-Biot-Probiotic (FUSION PLUS) CAPS Take 1 capsule by mouth daily. 08/14/21   Celso Amy, NP  Relugolix-Estradiol-Norethind (MYFEMBREE) 40-1-0.5 MG TABS Take 1 tablet by mouth daily. Patient  not taking: Reported on 08/21/2021 08/19/21   Radene Gunning, MD     Family History  Problem Relation Age of Onset   Hypertension Mother    Hypertension Brother    Breast cancer Maternal Aunt    Hypertension Maternal Uncle     Social History   Socioeconomic History   Marital status: Single    Spouse name: Not on file   Number of children: Not on file   Years of education: Not on file   Highest education level: Not on file  Occupational History   Not on file  Tobacco Use   Smoking status: Never   Smokeless  tobacco: Never  Vaping Use   Vaping Use: Never used  Substance and Sexual Activity   Alcohol use: No   Drug use: No   Sexual activity: Not Currently    Birth control/protection: Condom  Other Topics Concern   Not on file  Social History Narrative   Not on file   Social Determinants of Health   Financial Resource Strain: Not on file  Food Insecurity: Food Insecurity Present (08/13/2020)   Hunger Vital Sign    Worried About Running Out of Food in the Last Year: Often true    Ran Out of Food in the Last Year: Often true  Transportation Needs: Unmet Transportation Needs (08/13/2020)   PRAPARE - Hydrologist (Medical): Yes    Lack of Transportation (Non-Medical): Yes  Physical Activity: Not on file  Stress: Not on file  Social Connections: Not on file      Review of Systems  Constitutional: Negative.   Gastrointestinal:  Negative for abdominal pain and constipation.  Genitourinary:  Positive for frequency, menstrual problem and pelvic pain. Negative for difficulty urinating.    Vital Signs: There were no vitals taken for this visit.    Physical Exam Constitutional:      General: She is not in acute distress.    Appearance: She is not ill-appearing.  Cardiovascular:     Rate and Rhythm: Normal rate and regular rhythm.  Pulmonary:     Effort: Pulmonary effort is normal.     Breath sounds: Normal breath sounds.  Abdominal:     Palpations: Abdomen is soft.     Tenderness: There is no abdominal tenderness. There is no guarding.     Comments: Uterus is palpable above the umbilicus.  Neurological:     Mental Status: She is alert.        Imaging: IR Radiologist Eval & Mgmt  Result Date: 09/04/2021 Please refer to notes tab for details about interventional procedure. (Op Note)  MR PELVIS W WO CONTRAST  Result Date: 08/11/2021 CLINICAL DATA:  Uterine fibroids.  Surgical planning. EXAM: MRI PELVIS WITHOUT AND WITH CONTRAST TECHNIQUE:  Multiplanar multisequence MR imaging of the pelvis was performed both before and after administration of intravenous contrast. CONTRAST:  89m GADAVIST GADOBUTROL 1 MMOL/ML IV SOLN COMPARISON:  03/29/2019 FINDINGS: Lower Urinary Tract: No bladder or urethral abnormality identified. Bowel:  Unremarkable visualized pelvic bowel loops. Vascular/Lymphatic: No pathologically enlarged lymph nodes or other significant abnormality. Reproductive: -- Uterus: Measures 22.3 by 12.3 x 18.7 cm (volume = 2690 cm^3), minimally increased compared to 2,030 cm3 on prior exam. Numerous fibroids are again seen involving the uterus diffusely. The largest fibroid is located in the right uterine corpus with a prominent intracavitary component, measuring 16.1 x 7.8 by 12.7 cm, without significant change in size. Multiple other smaller intramural and subserosal fibroids  are again seen, largest in the left lateral corpus measuring 4.1 cm, also without significant change. No restricted diffusion seen. -- Intracavitary fibroids: Stable 16 cm right central uterine fibroid which has a prominent intracavitary component, without significant change since prior study. -- Pedunculated fibroids: None. -- Fibroid contrast enhancement: Many fibroids including the dominant intracavitary fibroid show diffuse contrast enhancement. There are multiple small intramural and subserosal fibroids which show lack enhancement, consistent with diffuse degeneration. These findings also show no significant change compared to prior study. -- Right ovary:  Appears normal.  No mass identified. -- Left ovary:  Appears normal.  No mass identified. Other: No abnormal free fluid. Musculoskeletal:  Unremarkable. IMPRESSION: Markedly enlarged uterus, with diffuse uterine involvement by fibroids, as described above. Largest in the right uterine corpus measures 16 cm and has a prominent intracavitary component. No significant change noted compared to prior exam. No pedunculated  fibroids identified. Normal appearance of both ovaries. No adnexal mass identified. Electronically Signed   By: Marlaine Hind M.D.   On: 08/11/2021 17:04    Labs:  CBC: Recent Labs    10/10/20 1648 06/06/21 1455 06/16/21 1442 08/14/21 1421  WBC 4.6 4.0 3.9* 4.0  HGB 6.9* 8.8* 9.3* 10.7*  HCT 26.3* 32.3* 34.1* 35.7*  PLT 394 319 325 272    COAGS: No results for input(s): "INR", "APTT" in the last 8760 hours.  BMP: Recent Labs    10/10/20 1648 06/16/21 1442  NA 140 139  K 3.6 3.9  CL 106 105  CO2 25 27  GLUCOSE 99 99  BUN 9 11  CALCIUM 9.0 9.7  CREATININE 0.71 0.77  GFRNONAA >60 >60    LIVER FUNCTION TESTS: Recent Labs    10/10/20 1648 06/16/21 1442  BILITOT 0.9 1.0  AST 12* 14*  ALT 8 9  ALKPHOS 44 56  PROT 7.2 7.1  ALBUMIN 3.8 4.3    TUMOR MARKERS: No results for input(s): "AFPTM", "CEA", "CA199", "CHROMGRNA" in the last 8760 hours.  Assessment and Plan:  46 year old with symptomatic uterine fibroids.  Patient's main complaint appears to be heavy menstrual bleeding and the patient has been treated for iron deficiency anemia due to chronic blood loss.  I reviewed the patient's recent MRI and prior CT and MRI imaging.  Patient also had an MRI on 03/29/2019.  Patient has a very large uterus that extends above the umbilicus.  There were several uterine fibroids but there is a dominant central fibroid that appears to have a large intracavitary component.  The dominant fibroid measures up to 16 cm which has not significantly changed in size since 2021.  There is diffuse enhancement throughout this large central fibroid.  There are smaller peripheral fibroids that do not show enhancement and suggestive for degeneration.  No large pedunculated uterine fibroids.  We discussed treatment options for the uterine fibroids including medical management, hysterectomy, myomectomy and uterine artery embolization procedure.  We had a thorough discussion about uterine artery  embolization procedure.  We discussed obtaining arterial access through either the left radial artery or a groin puncture.  Explained to the patient how we could treat the entire uterus through 1 incision but the patient should expect significant postembolization symptoms based on the large size of her uterus.  In addition, the large dominant uterine fibroid probably has a large intracavitary component and this could lead to prolonged vaginal discharge following a uterine artery embolization procedure.  We discussed overnight observation in the hospital for pain management and that the  patient would have significant pain and cramping immediately following the procedure.  Explained that the pain and cramping can last for a couple weeks after the procedure.  In addition we discussed the possibility of performing a uterine artery embolization prior to hysterectomy in order to decrease the risk of bleeding during hysterectomy and to possibly decrease the size of the uterus prior to hysterectomy.  Preoperative uterine artery embolization may decrease the risk of bleeding during hysterectomy and could be performed days, weeks or even months prior to the hysterectomy.  If we are hoping for the uterus to shrink prior to hysterectomy, then we may want to postpone a hysterectomy for 3 to 6 months after the embolization.  After a long discussion with the patient and her family, the patient did not express a desired course of action.  I think the patient needs some time to think over her treatment options.  The patient was very quiet throughout most of the visit and did not express a lot of her own opinions.  The patient would be a candidate for uterine artery embolization procedure by itself or prior to hysterectomy.  My concerns of treating the patient with uterine artery embolization alone is based on the large size of the uterus and the large intracavitary component of the dominant fibroid.  Concerned the patient would have  significant symptoms immediately following the procedure and she could have prolonged vaginal discharge due to degeneration of the dominant central fibroid.  Based on the patient's large uterus and the fibroid anatomy, I think the patient may get the most benefit from a hysterectomy and I will contact Dr. Damita Dunnings with regards to whether or not she feels strongly about performing a uterine artery embolization prior to the hysterectomy.  Plan to contact the patient after I have discussed the case with Dr. Damita Dunnings.  Thank you for this interesting consult.  I greatly enjoyed meeting [name] and look forward to participating in their care.  A copy of this report was sent to the requesting provider on this date.  Electronically Signed: Burman Riis 09/04/2021, 3:12 PM   I spent a total of  60 Minutes   in face to face in clinical consultation, greater than 50% of which was counseling/coordinating care for symptomatic uterine fibroids.

## 2021-09-05 ENCOUNTER — Encounter: Payer: Self-pay | Admitting: Family Medicine

## 2021-09-05 ENCOUNTER — Ambulatory Visit (INDEPENDENT_AMBULATORY_CARE_PROVIDER_SITE_OTHER): Payer: BC Managed Care – PPO | Admitting: Family Medicine

## 2021-09-05 ENCOUNTER — Telehealth: Payer: Self-pay | Admitting: Family

## 2021-09-05 ENCOUNTER — Encounter: Payer: Self-pay | Admitting: Family

## 2021-09-05 VITALS — BP 120/76 | HR 70 | Temp 97.6°F | Ht 62.0 in | Wt 198.0 lb

## 2021-09-05 DIAGNOSIS — D259 Leiomyoma of uterus, unspecified: Secondary | ICD-10-CM

## 2021-09-05 DIAGNOSIS — F418 Other specified anxiety disorders: Secondary | ICD-10-CM | POA: Diagnosis not present

## 2021-09-05 DIAGNOSIS — D500 Iron deficiency anemia secondary to blood loss (chronic): Secondary | ICD-10-CM

## 2021-09-05 MED ORDER — ESCITALOPRAM OXALATE 20 MG PO TABS: 20.0000 mg | ORAL_TABLET | Freq: Every day | ORAL | 1 refills | Status: DC

## 2021-09-05 NOTE — Progress Notes (Signed)
Subjective:     Patient ID: [name], female    DOB: 1975-11-11, 46 y.o.   MRN: [id number]  Chief Complaint  Patient presents with   Medications    Discuss medications that she stopped to see if she need to be back on them    Menstrual Problem    Discuss menstrual cycle    HPI-here w/both daughters Moods-stopped buspar-stopped as thought was making sleepy.  Taking lexapro daily. No emotions-past 1 yr but not from meds. Depressed, anxious-more so since needs hyst. Sleeps intermitt.  Some ha.   Buspar-was sleeping all the time. Zoloft made sleep all the time so change to lexapro at last ov.   Worrying a lot.  Crying more. Overwhelmed.  No SI Had appt yest to discuss embolization of uterus and conflicting.   Sch for hyst Sept. Having trouble getting myfembree filled-ins.    Health Maintenance Due  Topic Date Due   Hepatitis C Screening  Never done   COLONOSCOPY (Pts 45-11yr Insurance coverage will need to be confirmed)  Never done    Past Medical History:  Diagnosis Date   Anemia    Asthma    Blood transfusion without reported diagnosis    Gestational diabetes    Uterine fibroid     Past Surgical History:  Procedure Laterality Date   arm surgery     CERVICAL CERCLAGE     CESAREAN SECTION     DILATION AND CURETTAGE OF UTERUS     IR RADIOLOGIST EVAL & MGMT  09/04/2021   TOOTH EXTRACTION      Outpatient Medications Prior to Visit  Medication Sig Dispense Refill   albuterol (PROVENTIL HFA;VENTOLIN HFA) 108 (90 Base) MCG/ACT inhaler Inhale 2 puffs into the lungs every 6 (six) hours as needed for wheezing or shortness of breath.     cetirizine (ZYRTEC ALLERGY) 10 MG tablet Take 1 tablet (10 mg total) by mouth daily. 30 tablet 5   fluticasone (FLONASE) 50 MCG/ACT nasal spray Place 1 spray into both nostrils in the morning and at bedtime. 16 g 2   Iron-FA-B Cmp-C-Biot-Probiotic (FUSION PLUS) CAPS Take 1 capsule by mouth daily. 30 capsule 11   escitalopram (LEXAPRO) 10 MG  tablet TAKE 1 TABLET BY MOUTH EVERY DAY 90 tablet 1   Relugolix-Estradiol-Norethind (MYFEMBREE) 40-1-0.5 MG TABS Take 1 tablet by mouth daily. (Patient not taking: Reported on 09/05/2021) 28 tablet 3   busPIRone (BUSPAR) 5 MG tablet Take 1 tablet (5 mg total) by mouth 2 (two) times daily. (Patient not taking: Reported on 09/05/2021) 60 tablet 0   No facility-administered medications prior to visit.    Allergies  Allergen Reactions   Shellfish Allergy Itching and Swelling    Throat swells    Latex    Other Itching and Swelling    Hazelnut   ROS neg/noncontributory except as noted HPI/below Some doe-up stairs.  Anemia.       Objective:     BP 120/76   Pulse 70   Temp 97.6 F (36.4 C) (Temporal)   Ht '5\' 2"'$  (1.575 m)   Wt 198 lb (89.8 kg)   LMP 08/13/2021 (Approximate)   SpO2 97%   BMI 36.21 kg/m  Wt Readings from Last 3 Encounters:  09/05/21 198 lb (89.8 kg)  08/21/21 194 lb (88 kg)  08/14/21 194 lb 12.8 oz (88.4 kg)    Physical Exam   Gen: WDWN NAD aaf HEENT: NCAT, conjunctiva not injected, sclera nonicteric NECK:  supple, no thyromegaly,  no nodes, no carotid bruits CARDIAC: RRR, S1S2+, no murmur. DP 2+B LUNGS: CTAB. No wheezes EXT:  no edema MSK: no gross abnormalities.  NEURO: A&O x3.  CN II-XII intact.  PSYCH: normal mood. Good eye contact     Assessment & Plan:   Problem List Items Addressed This Visit       Genitourinary   Fibroid uterus     Other   Iron deficiency anemia due to chronic blood loss   Mixed anxiety and depressive disorder - Primary   Relevant Medications   escitalopram (LEXAPRO) 20 MG tablet   Mixed anxiety/depression-chronic. Not ideal.  Increase lexapro to '20mg'$ .  She will remain in touch thru daughter who works here.   IDA due to blood loss from uterus.  On iron-add B complex.  Going for hyst 9/6-listened to concerns    Meds ordered this encounter  Medications   escitalopram (LEXAPRO) 20 MG tablet    Sig: Take 1 tablet (20 mg  total) by mouth daily.    Dispense:  90 tablet    Refill:  1    Wellington Hampshire, MD

## 2021-09-05 NOTE — Telephone Encounter (Signed)
Pt states: -Wants to speak with Dr. Cherlynn Kaiser about "medication she prescribed" -pt declined to speak with PCP. -pt declined to give any other information.  Pt requests: -call back   Routing to both teams

## 2021-09-05 NOTE — Telephone Encounter (Signed)
approved

## 2021-09-05 NOTE — Telephone Encounter (Signed)
Patient scheduled at 4 pm today to discuss with Dr. Cherlynn Kaiser.

## 2021-09-05 NOTE — Patient Instructions (Addendum)
Get B complex vitamins Increase Lexapro to '20mg'$   Good luck with surgery

## 2021-09-05 NOTE — Telephone Encounter (Signed)
Pt would like to transfer primary care from Jeanie Sewer, FNP to Dr. Esther Hardy.    Approve or Decline.   Please route to [facility] Admin pool to schedule

## 2021-09-08 ENCOUNTER — Encounter: Payer: Self-pay | Admitting: Obstetrics and Gynecology

## 2021-09-08 ENCOUNTER — Telehealth: Payer: Self-pay | Admitting: *Deleted

## 2021-09-08 ENCOUNTER — Other Ambulatory Visit: Payer: Self-pay | Admitting: Obstetrics and Gynecology

## 2021-09-08 DIAGNOSIS — N920 Excessive and frequent menstruation with regular cycle: Secondary | ICD-10-CM

## 2021-09-08 DIAGNOSIS — D259 Leiomyoma of uterus, unspecified: Secondary | ICD-10-CM

## 2021-09-08 MED ORDER — MEGESTROL ACETATE 40 MG PO TABS: 40.0000 mg | ORAL_TABLET | Freq: Two times a day (BID) | ORAL | 0 refills | Status: DC

## 2021-09-08 NOTE — Telephone Encounter (Signed)
Call from daughter of patient. Patient wants to talk to Dr. Damita Dunnings about surgery and IR appointment. Advised to send Dr. Damita Dunnings a MyChart message because she is not at [facility] until 09/11/21.

## 2021-09-12 NOTE — Telephone Encounter (Signed)
I have left patient vm to call back to cancel 9/1 appt with Hud[place] and schedule TOC with Cherlynn Kaiser.

## 2021-09-16 ENCOUNTER — Encounter: Payer: Self-pay | Admitting: Obstetrics and Gynecology

## 2021-09-22 ENCOUNTER — Other Ambulatory Visit: Payer: Self-pay

## 2021-09-22 ENCOUNTER — Other Ambulatory Visit: Payer: Self-pay | Admitting: *Deleted

## 2021-09-22 DIAGNOSIS — F418 Other specified anxiety disorders: Secondary | ICD-10-CM

## 2021-09-22 NOTE — Addendum Note (Signed)
Addended by: Zacarias Pontes on: 09/22/2021 02:24 PM   Modules accepted: Orders

## 2021-09-28 IMAGING — CT CT ABD-PELV W/ CM
2 of 5 series · 16 of 46 positions shown, 18 images · IV contrast (omnipaque)
Comparison: CT July 22, 2020, MRI March 29, 2019 and CT [place]

CLINICAL DATA: Right lower quadrant abdominal pain.

EXAM:
CT ABDOMEN AND PELVIS WITH CONTRAST
TECHNIQUE: Multidetector CT imaging of the abdomen and pelvis was performed
using the standard protocol following bolus administration of
intravenous contrast.
CONTRAST:  80mL OMNIPAQUE IOHEXOL 350 MG/ML SOLN

[Series 2: axial st · axial · 0.76mm/px · z∈[+1078,+1508]mm · 13 of 100 slices shown, 15 images]
[im 7/100  soft-tissue]
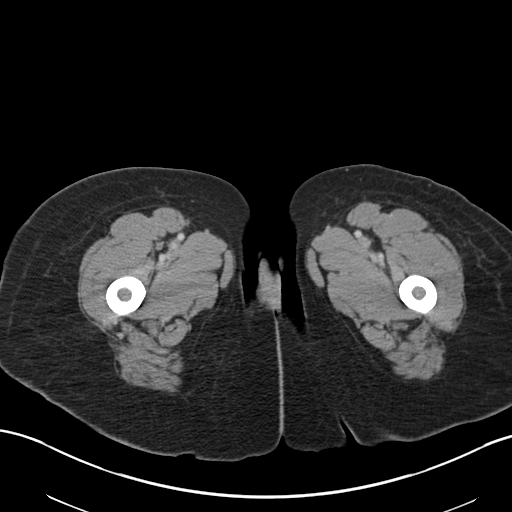
[im 7/100  bone]
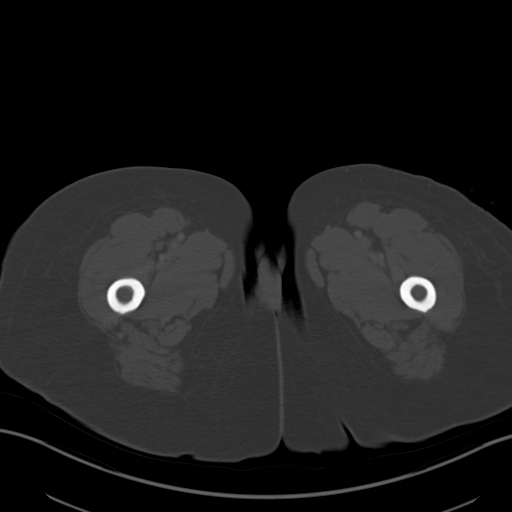
[im 14/100  soft-tissue]
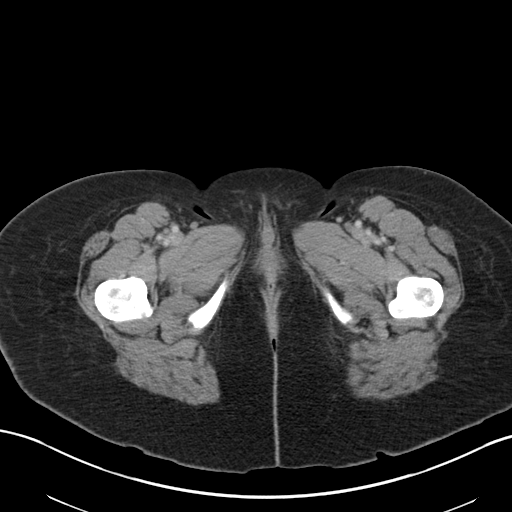
[im 20/100  soft-tissue]
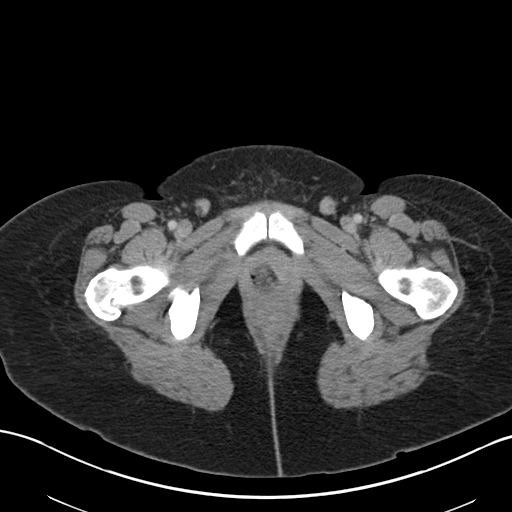
[im 27/100  soft-tissue]
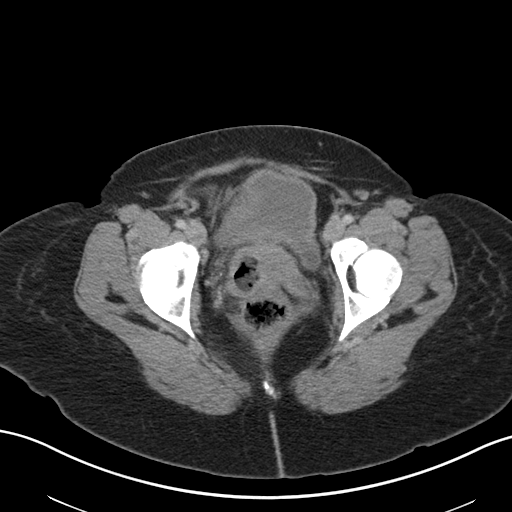
[im 34/100  soft-tissue]
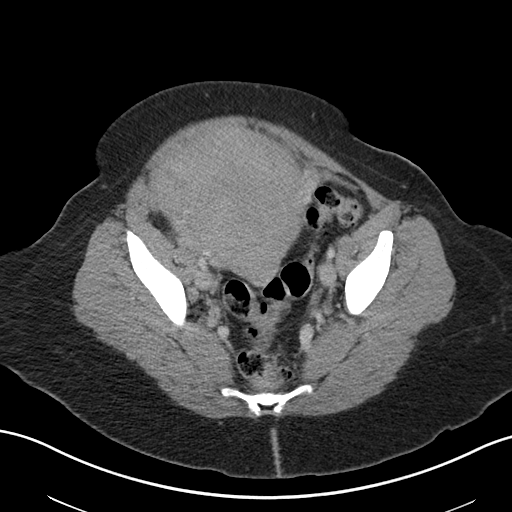
[im 40/100  soft-tissue]
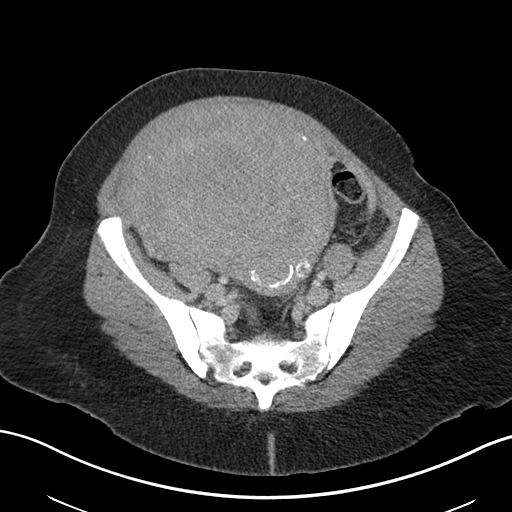
[im 53/100  soft-tissue]
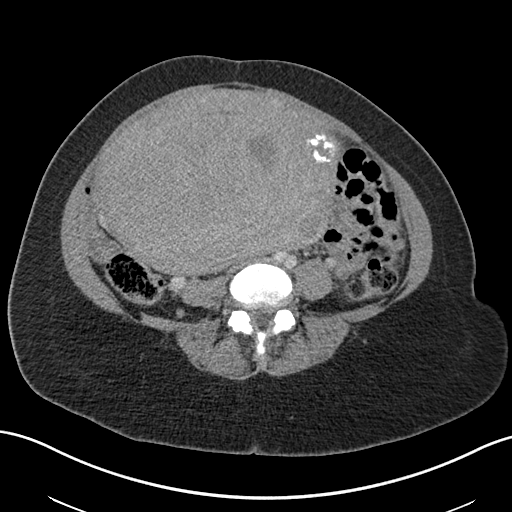
[im 60/100  soft-tissue]
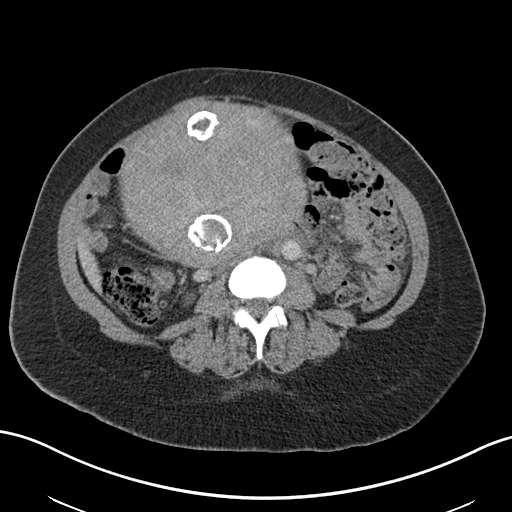
[im 67/100  soft-tissue]
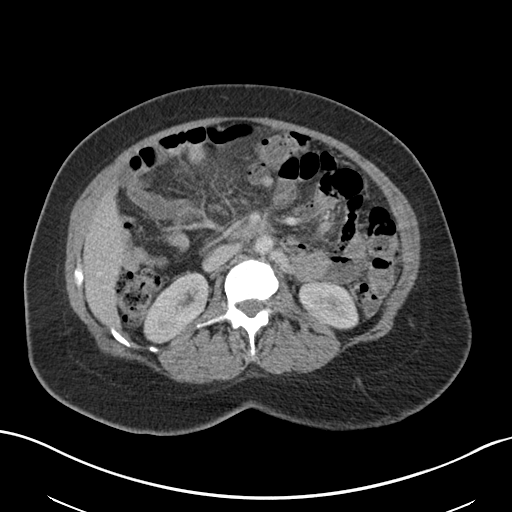
[im 67/100  bone]
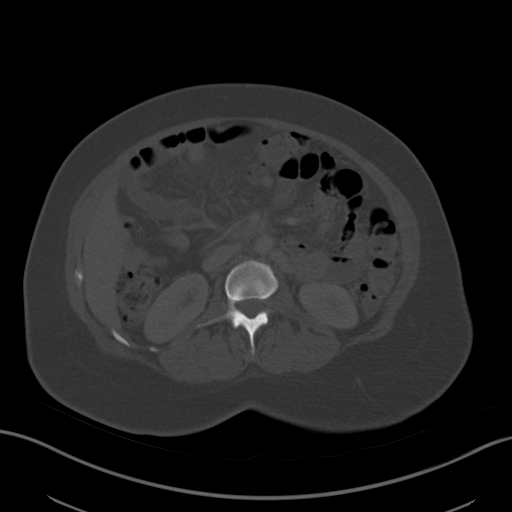
[im 73/100  soft-tissue]
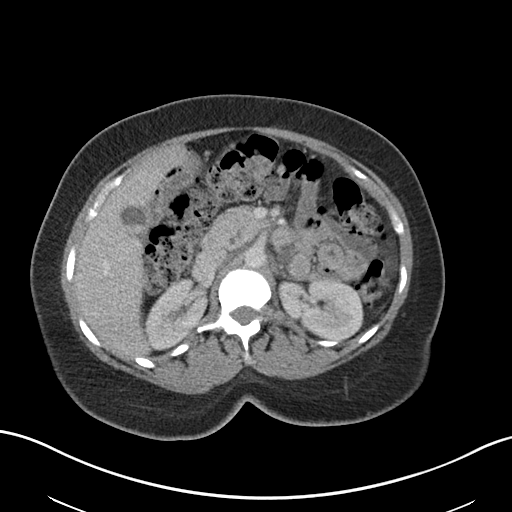
[im 80/100  soft-tissue]
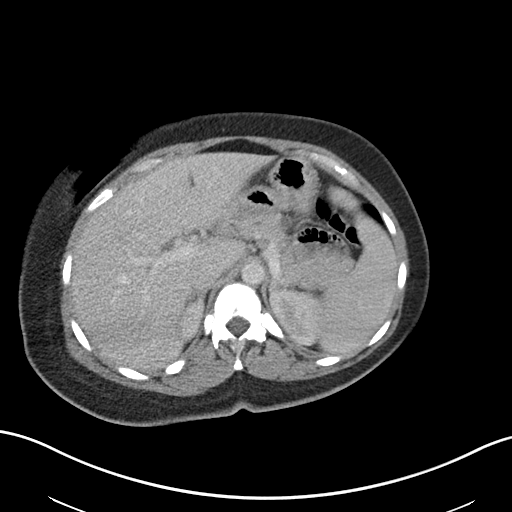
[im 86/100  soft-tissue]
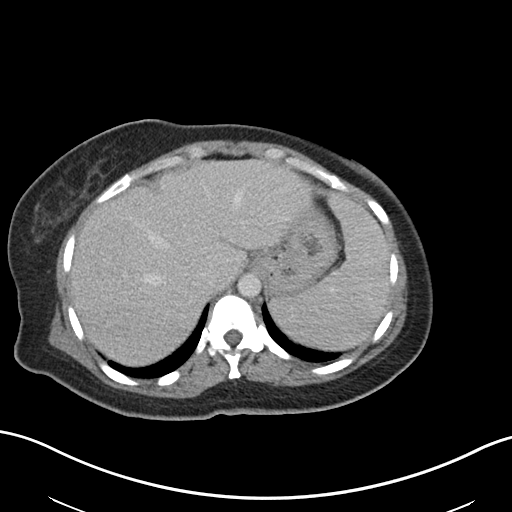
[im 93/100  soft-tissue]
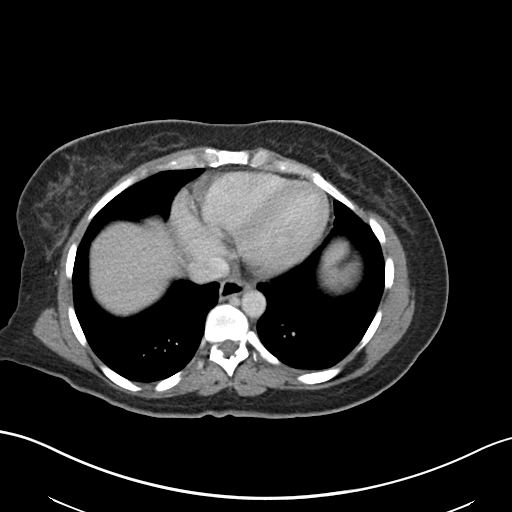

[Series 4: coronal st · coronal · 0.95mm/px · 3 of 144 slices shown]
[im 48/144  soft-tissue]
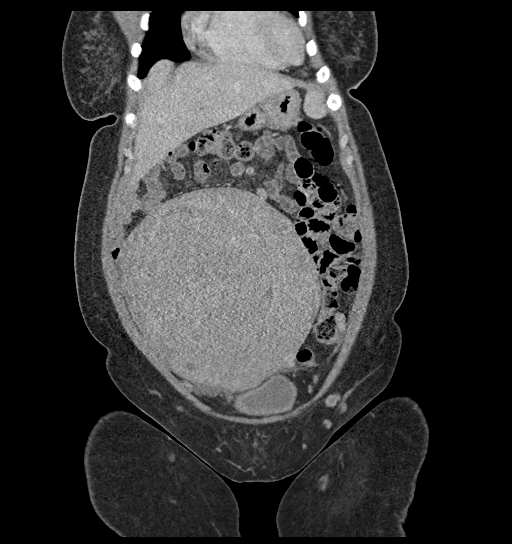
[im 64/144  soft-tissue]
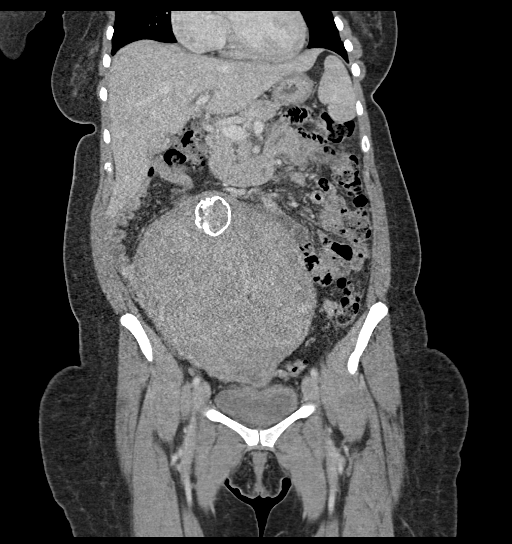
[im 80/144  soft-tissue]
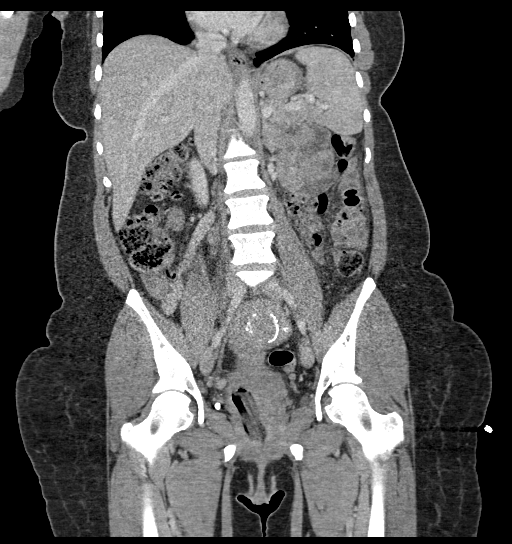

[16 of 46 positions shown; findings below may reference images not displayed]

FINDINGS: Lower chest: Hypoventilatory change in the dependent lungs.

Hepatobiliary: No suspicious hepatic lesion. Cholelithiasis without
findings of acute cholecystitis. No biliary ductal dilation.

Pancreas: No evidence of acute pancreatic inflammation or ductal
dilation.

Spleen: Within normal limits.

Adrenals/Urinary Tract: Bilateral adrenal glands are unremarkable.
No hydronephrosis. Subcentimeter left interpolar renal lesion
measuring 6 mm on image [DATE] which is technically too small to
accurately characterize but statistically likely represent a cyst.
Mild wall thickening of an incompletely distended urinary bladder.

Stomach/Bowel: Small hiatal hernia otherwise stomach is unremarkable
for degree of distension. No pathologic dilation of small or large
bowel. The appendix and terminal ileum appear normal. Small volume
of formed stool throughout the colon. Scattered colonic
diverticulosis without findings of acute diverticulitis.

Vascular/Lymphatic: No abdominal aortic aneurysm. No pathologically
enlarged abdominal or pelvic lymph nodes.

Reproductive: Tampon in the vagina. Again seen is marked
leiomyomatous enlargement of the uterus with the uterus measuring
approximately 20.9 x 18.2 x 13.6 cm which is similar prior. There
are calcified and noncalcified low myomas with the largest fibroid
measuring approximately 13.6 cm. No suspicious adnexal masses.

Other: No abdominopelvic ascites. No walled off fluid collections.
No pneumoperitoneum.

Musculoskeletal: No acute or suspicious osseous findings. Marked
osteitis condensans ilii.
IMPRESSION: 1. Mild wall thickening of an incompletely distended urinary
bladder. Correlate with urinalysis to exclude cystitis.
2. Stable marked leiomyomatous enlargement of the uterus.
3. Normal appendix and terminal ileum.
4. Cholelithiasis without findings of acute cholecystitis.
5. Colonic diverticulosis without findings of acute diverticulitis.

## 2021-10-02 DIAGNOSIS — D250 Submucous leiomyoma of uterus: Secondary | ICD-10-CM | POA: Diagnosis not present

## 2021-10-02 DIAGNOSIS — N920 Excessive and frequent menstruation with regular cycle: Secondary | ICD-10-CM | POA: Diagnosis not present

## 2021-10-02 DIAGNOSIS — D251 Intramural leiomyoma of uterus: Secondary | ICD-10-CM | POA: Diagnosis not present

## 2021-10-02 DIAGNOSIS — D500 Iron deficiency anemia secondary to blood loss (chronic): Secondary | ICD-10-CM | POA: Diagnosis not present

## 2021-10-02 NOTE — Progress Notes (Signed)
Anesthesia Review:  PCP: DR Esther Hardy  Cardiologist :LOV 09/05/21  Chest x-ray : EKG : Echo : Stress test: Cardiac Cath :  Activity level:  Sleep Study/ CPAP : Fasting Blood Sugar :      / Checks Blood Sugar -- times a day:   Blood Thinner/ Instructions /Last Dose: ASA / Instructions/ Last Dose :   Diabetes

## 2021-10-03 ENCOUNTER — Ambulatory Visit: Payer: Medicaid Other | Admitting: Family

## 2021-10-07 ENCOUNTER — Telehealth: Payer: Self-pay | Admitting: Family

## 2021-10-07 NOTE — Telephone Encounter (Signed)
Patient states: -Following OV with Dr. Cherlynn Kaiser on 08/04 she was informed by provider to reach out if she wanted a different medication sent in  - She had forgotten which medication Dr. Cherlynn Kaiser was referring too.   Prescription can be sent to [facility] at [facility]

## 2021-10-07 NOTE — Anesthesia Preprocedure Evaluation (Signed)
Anesthesia Evaluation  Patient identified by MRN, date of birth, ID band Patient awake    Reviewed: Allergy & Precautions, NPO status , Patient's Chart, lab work & pertinent test results  History of Anesthesia Complications Negative for: history of anesthetic complications  Airway Mallampati: II  TM Distance: >3 FB Neck ROM: Full    Dental  (+) Teeth Intact, Dental Advisory Given   Pulmonary asthma ,    Pulmonary exam normal        Cardiovascular negative cardio ROS Normal cardiovascular exam     Neuro/Psych Anxiety Depression negative neurological ROS     GI/Hepatic negative GI ROS, Neg liver ROS,   Endo/Other  diabetes  Renal/GU negative Renal ROS  negative genitourinary   Musculoskeletal negative musculoskeletal ROS (+)   Abdominal   Peds  Hematology  (+) Blood dyscrasia, anemia ,   Anesthesia Other Findings   Reproductive/Obstetrics Fibroids                            Anesthesia Physical Anesthesia Plan  ASA: 2  Anesthesia Plan: General   Post-op Pain Management: Tylenol PO (pre-op)* and Toradol IV (intra-op)*   Induction: Intravenous  PONV Risk Score and Plan: 3 and Ondansetron, Dexamethasone, Treatment may vary due to age or medical condition and Midazolam  Airway Management Planned: Oral ETT  Additional Equipment: None  Intra-op Plan:   Post-operative Plan: Extubation in OR  Informed Consent: I have reviewed the patients History and Physical, chart, labs and discussed the procedure including the risks, benefits and alternatives for the proposed anesthesia with the patient or authorized representative who has indicated his/her understanding and acceptance.     Dental advisory given  Plan Discussed with:   Anesthesia Plan Comments:        Anesthesia Quick Evaluation

## 2021-10-07 NOTE — Telephone Encounter (Signed)
Please see note below. 

## 2021-10-08 ENCOUNTER — Other Ambulatory Visit: Payer: Self-pay

## 2021-10-08 ENCOUNTER — Encounter (HOSPITAL_COMMUNITY): Payer: Self-pay | Admitting: Obstetrics and Gynecology

## 2021-10-08 ENCOUNTER — Encounter (HOSPITAL_COMMUNITY)
Admission: RE | Disposition: A | Discharge status: Home / Self Care | Payer: Self-pay | Source: Home / Self Care | Attending: Obstetrics and Gynecology

## 2021-10-08 ENCOUNTER — Inpatient Hospital Stay (HOSPITAL_COMMUNITY)
Admission: RE | Admit: 2021-10-08 | Discharge: 2021-10-10 | DRG: 742 | Disposition: A | Discharge status: Home / Self Care | Payer: BC Managed Care – PPO | Attending: Obstetrics and Gynecology | Admitting: Obstetrics and Gynecology

## 2021-10-08 ENCOUNTER — Inpatient Hospital Stay (HOSPITAL_COMMUNITY): Payer: BC Managed Care – PPO | Admitting: Physician Assistant

## 2021-10-08 DIAGNOSIS — R102 Pelvic and perineal pain: Secondary | ICD-10-CM

## 2021-10-08 DIAGNOSIS — D219 Benign neoplasm of connective and other soft tissue, unspecified: Secondary | ICD-10-CM | POA: Diagnosis not present

## 2021-10-08 DIAGNOSIS — N920 Excessive and frequent menstruation with regular cycle: Secondary | ICD-10-CM | POA: Diagnosis present

## 2021-10-08 DIAGNOSIS — Z888 Allergy status to other drugs, medicaments and biological substances status: Secondary | ICD-10-CM | POA: Diagnosis not present

## 2021-10-08 DIAGNOSIS — D252 Subserosal leiomyoma of uterus: Secondary | ICD-10-CM | POA: Diagnosis not present

## 2021-10-08 DIAGNOSIS — N938 Other specified abnormal uterine and vaginal bleeding: Secondary | ICD-10-CM

## 2021-10-08 DIAGNOSIS — D259 Leiomyoma of uterus, unspecified: Principal | ICD-10-CM | POA: Diagnosis present

## 2021-10-08 DIAGNOSIS — D500 Iron deficiency anemia secondary to blood loss (chronic): Secondary | ICD-10-CM

## 2021-10-08 DIAGNOSIS — Z91013 Allergy to seafood: Secondary | ICD-10-CM | POA: Diagnosis not present

## 2021-10-08 DIAGNOSIS — Z79890 Hormone replacement therapy (postmenopausal): Secondary | ICD-10-CM

## 2021-10-08 DIAGNOSIS — Z91040 Latex allergy status: Secondary | ICD-10-CM

## 2021-10-08 DIAGNOSIS — D630 Anemia in neoplastic disease: Secondary | ICD-10-CM | POA: Diagnosis not present

## 2021-10-08 DIAGNOSIS — D62 Acute posthemorrhagic anemia: Secondary | ICD-10-CM | POA: Diagnosis not present

## 2021-10-08 DIAGNOSIS — Z79899 Other long term (current) drug therapy: Secondary | ICD-10-CM | POA: Diagnosis not present

## 2021-10-08 HISTORY — PX: HYSTERECTOMY ABDOMINAL WITH SALPINGECTOMY: SHX6725

## 2021-10-08 LAB — BASIC METABOLIC PANEL
Anion gap: 7 (ref 5–15)
BUN: 7 mg/dL (ref 6–20)
CO2: 26 mmol/L (ref 22–32)
Calcium: 9.4 mg/dL (ref 8.9–10.3)
Chloride: 106 mmol/L (ref 98–111)
Creatinine, Ser: 0.72 mg/dL (ref 0.44–1.00)
GFR, Estimated: >60 mL/min (ref >60)
Glucose, Bld: 126 mg/dL — ABNORMAL HIGH (ref 70–99)
Potassium: 3.5 mmol/L (ref 3.5–5.1)
Sodium: 139 mmol/L (ref 135–145)

## 2021-10-08 LAB — CBC
HCT: 31 % — ABNORMAL LOW (ref 36.0–46.0)
HCT: 25.8 % — ABNORMAL LOW (ref 36.0–46.0)
Hemoglobin: 9.1 g/dL — ABNORMAL LOW (ref 12.0–15.0)
Hemoglobin: 7.6 g/dL — ABNORMAL LOW (ref 12.0–15.0)
MCH: 24.8 pg — ABNORMAL LOW (ref 26.0–34.0)
MCH: 25.2 pg — ABNORMAL LOW (ref 26.0–34.0)
MCHC: 29.4 g/dL — ABNORMAL LOW (ref 30.0–36.0)
MCHC: 29.5 g/dL — ABNORMAL LOW (ref 30.0–36.0)
MCV: 84.5 fL (ref 80.0–100.0)
MCV: 85.7 fL (ref 80.0–100.0)
Platelets: 309 10*3/uL (ref 150–400)
Platelets: 266 10*3/uL (ref 150–400)
RBC: 3.67 MIL/uL — ABNORMAL LOW (ref 3.87–5.11)
RBC: 3.01 MIL/uL — ABNORMAL LOW (ref 3.87–5.11)
RDW: 15.5 % (ref 11.5–15.5)
RDW: 15.8 % — ABNORMAL HIGH (ref 11.5–15.5)
WBC: 4.4 10*3/uL (ref 4.0–10.5)
WBC: 6.3 10*3/uL (ref 4.0–10.5)
nRBC: 0 % (ref 0.0–0.2)
nRBC: 0 % (ref 0.0–0.2)

## 2021-10-08 LAB — PREPARE RBC (CROSSMATCH)

## 2021-10-08 LAB — POCT PREGNANCY, URINE: Preg Test, Ur: NEGATIVE

## 2021-10-08 LAB — GLUCOSE, CAPILLARY: Glucose-Capillary: 120 mg/dL — ABNORMAL HIGH (ref 70–99)

## 2021-10-08 SURGERY — HYSTERECTOMY, TOTAL, ABDOMINAL, WITH SALPINGECTOMY
Anesthesia: General | Site: Abdomen | Laterality: Bilateral

## 2021-10-08 MED ORDER — NALOXONE HCL 0.4 MG/ML IJ SOLN: 0.4000 mg | INTRAMUSCULAR | Status: DC | PRN

## 2021-10-08 MED ORDER — ACETAMINOPHEN 500 MG PO TABS
ORAL_TABLET | ORAL | Status: AC
  Administered 2021-10-08: 1000 mg via ORAL
  Filled 2021-10-08: qty 2

## 2021-10-08 MED ORDER — ONDANSETRON HCL 4 MG/2ML IJ SOLN
INTRAMUSCULAR | Status: AC
Start: 2021-10-08 — End: ?
  Filled 2021-10-08: qty 2

## 2021-10-08 MED ORDER — PROPOFOL 10 MG/ML IV BOLUS
INTRAVENOUS | Status: AC
  Filled 2021-10-08: qty 20

## 2021-10-08 MED ORDER — KETOROLAC TROMETHAMINE 15 MG/ML IJ SOLN: 15.0000 mg | INTRAMUSCULAR | Status: DC

## 2021-10-08 MED ORDER — LIDOCAINE 2% (20 MG/ML) 5 ML SYRINGE
INTRAMUSCULAR | Status: DC | PRN
  Administered 2021-10-08: 60 mg via INTRAVENOUS

## 2021-10-08 MED ORDER — LIDOCAINE 2% (20 MG/ML) 5 ML SYRINGE
INTRAMUSCULAR | Status: AC
Start: 2021-10-08 — End: ?
  Filled 2021-10-08: qty 5

## 2021-10-08 MED ORDER — HYDROMORPHONE 1 MG/ML IV SOLN
INTRAVENOUS | Status: DC
  Administered 2021-10-08: 30 mg via INTRAVENOUS
  Administered 2021-10-08: 2.1 mg via INTRAVENOUS
  Administered 2021-10-08: 0.3 mg via INTRAVENOUS
  Administered 2021-10-08: 1 mg via INTRAVENOUS
  Administered 2021-10-09: 0.3 mg via INTRAVENOUS

## 2021-10-08 MED ORDER — KETOROLAC TROMETHAMINE 30 MG/ML IJ SOLN
INTRAMUSCULAR | Status: DC | PRN
  Administered 2021-10-08 (×2): 15 mg via INTRAVENOUS

## 2021-10-08 MED ORDER — BUPIVACAINE HCL 0.25 % IJ SOLN
INTRAMUSCULAR | Status: DC | PRN
  Administered 2021-10-08: 30 mL

## 2021-10-08 MED ORDER — MIDAZOLAM HCL 2 MG/2ML IJ SOLN
INTRAMUSCULAR | Status: DC | PRN
  Administered 2021-10-08: 2 mg via INTRAVENOUS

## 2021-10-08 MED ORDER — MIDAZOLAM HCL 2 MG/2ML IJ SOLN
INTRAMUSCULAR | Status: AC
Start: 2021-10-08 — End: ?
  Filled 2021-10-08: qty 2

## 2021-10-08 MED ORDER — ACETAMINOPHEN 500 MG PO TABS: 1000.0000 mg | ORAL_TABLET | Freq: Once | ORAL | Status: AC

## 2021-10-08 MED ORDER — KETOROLAC TROMETHAMINE 30 MG/ML IJ SOLN
15.0000 mg | Freq: Three times a day (TID) | INTRAMUSCULAR | Status: DC
Start: 2021-10-08 — End: 2021-10-09
  Administered 2021-10-08 – 2021-10-09 (×2): 15 mg via INTRAVENOUS
  Filled 2021-10-08 (×2): qty 1

## 2021-10-08 MED ORDER — ONDANSETRON HCL 4 MG PO TABS: 4.0000 mg | ORAL_TABLET | Freq: Four times a day (QID) | ORAL | Status: DC | PRN

## 2021-10-08 MED ORDER — ONDANSETRON HCL 4 MG/2ML IJ SOLN
INTRAMUSCULAR | Status: DC | PRN
  Administered 2021-10-08: 4 mg via INTRAVENOUS

## 2021-10-08 MED ORDER — SODIUM CHLORIDE 0.9% FLUSH
9.0000 mL | INTRAVENOUS | Status: DC | PRN
Start: 2021-10-08 — End: 2021-10-09

## 2021-10-08 MED ORDER — ALBUMIN HUMAN 5 % IV SOLN: INTRAVENOUS | Status: DC | PRN

## 2021-10-08 MED ORDER — DIPHENHYDRAMINE HCL 12.5 MG/5ML PO ELIX: 12.5000 mg | ORAL_SOLUTION | Freq: Four times a day (QID) | ORAL | Status: DC | PRN

## 2021-10-08 MED ORDER — KETOROLAC TROMETHAMINE 30 MG/ML IJ SOLN
INTRAMUSCULAR | Status: AC
Start: 2021-10-08 — End: ?
  Filled 2021-10-08: qty 1

## 2021-10-08 MED ORDER — MUPIROCIN 2 % EX OINT
1.0000 | TOPICAL_OINTMENT | Freq: Once | CUTANEOUS | Status: AC
  Administered 2021-10-08: 1 via TOPICAL
  Filled 2021-10-08: qty 22

## 2021-10-08 MED ORDER — ACETAMINOPHEN 500 MG PO TABS: 1000.0000 mg | ORAL_TABLET | ORAL | Status: DC

## 2021-10-08 MED ORDER — ENOXAPARIN SODIUM 40 MG/0.4ML IJ SOSY
40.0000 mg | PREFILLED_SYRINGE | INTRAMUSCULAR | Status: DC
  Administered 2021-10-09 – 2021-10-10 (×2): 40 mg via SUBCUTANEOUS
  Filled 2021-10-08 (×2): qty 0.4

## 2021-10-08 MED ORDER — POLYETHYLENE GLYCOL 3350 17 G PO PACK: 17.0000 g | PACK | Freq: Every day | ORAL | Status: DC | PRN

## 2021-10-08 MED ORDER — DIPHENHYDRAMINE HCL 50 MG/ML IJ SOLN: 12.5000 mg | Freq: Four times a day (QID) | INTRAMUSCULAR | Status: DC | PRN

## 2021-10-08 MED ORDER — ONDANSETRON HCL 4 MG/2ML IJ SOLN: 4.0000 mg | Freq: Four times a day (QID) | INTRAMUSCULAR | Status: DC | PRN

## 2021-10-08 MED ORDER — ORAL CARE MOUTH RINSE: 15.0000 mL | OROMUCOSAL | Status: DC | PRN

## 2021-10-08 MED ORDER — SIMETHICONE 80 MG PO CHEW
80.0000 mg | CHEWABLE_TABLET | Freq: Four times a day (QID) | ORAL | Status: DC | PRN
  Administered 2021-10-09: 80 mg via ORAL
  Filled 2021-10-08: qty 1

## 2021-10-08 MED ORDER — OXYCODONE HCL 5 MG/5ML PO SOLN: 5.0000 mg | Freq: Once | ORAL | Status: DC | PRN

## 2021-10-08 MED ORDER — DEXAMETHASONE SODIUM PHOSPHATE 10 MG/ML IJ SOLN
INTRAMUSCULAR | Status: AC
Start: 2021-10-08 — End: ?
  Filled 2021-10-08: qty 1

## 2021-10-08 MED ORDER — POVIDONE-IODINE 10 % EX SWAB: 2.0000 | Freq: Once | CUTANEOUS | Status: DC

## 2021-10-08 MED ORDER — PANTOPRAZOLE SODIUM 40 MG IV SOLR
40.0000 mg | Freq: Every day | INTRAVENOUS | Status: DC
  Administered 2021-10-08: 40 mg via INTRAVENOUS
  Filled 2021-10-08: qty 10

## 2021-10-08 MED ORDER — GABAPENTIN 100 MG PO CAPS
100.0000 mg | ORAL_CAPSULE | Freq: Two times a day (BID) | ORAL | Status: DC
  Administered 2021-10-08 – 2021-10-10 (×5): 100 mg via ORAL
  Filled 2021-10-08 (×5): qty 1

## 2021-10-08 MED ORDER — FENTANYL CITRATE (PF) 250 MCG/5ML IJ SOLN
INTRAMUSCULAR | Status: DC | PRN
  Administered 2021-10-08: 150 ug via INTRAVENOUS

## 2021-10-08 MED ORDER — 0.9 % SODIUM CHLORIDE (POUR BTL) OPTIME
TOPICAL | Status: DC | PRN
  Administered 2021-10-08 (×2): 1000 mL

## 2021-10-08 MED ORDER — OXYCODONE HCL 5 MG PO TABS: 5.0000 mg | ORAL_TABLET | Freq: Once | ORAL | Status: DC | PRN

## 2021-10-08 MED ORDER — MENTHOL 3 MG MT LOZG: 1.0000 | LOZENGE | OROMUCOSAL | Status: DC | PRN

## 2021-10-08 MED ORDER — CEFAZOLIN SODIUM-DEXTROSE 2-4 GM/100ML-% IV SOLN
2.0000 g | INTRAVENOUS | Status: AC
  Administered 2021-10-08: 2 g via INTRAVENOUS

## 2021-10-08 MED ORDER — FENTANYL CITRATE (PF) 250 MCG/5ML IJ SOLN
INTRAMUSCULAR | Status: AC
  Filled 2021-10-08: qty 5

## 2021-10-08 MED ORDER — LACTATED RINGERS IV SOLN: INTRAVENOUS | Status: DC

## 2021-10-08 MED ORDER — PROPOFOL 10 MG/ML IV BOLUS
INTRAVENOUS | Status: DC | PRN
  Administered 2021-10-08: 140 mg via INTRAVENOUS

## 2021-10-08 MED ORDER — SODIUM CHLORIDE 0.9 % IV SOLN: INTRAVENOUS | Status: DC

## 2021-10-08 MED ORDER — CHLORHEXIDINE GLUCONATE 0.12 % MT SOLN
15.0000 mL | Freq: Once | OROMUCOSAL | Status: AC
Start: 2021-10-08 — End: 2021-10-08

## 2021-10-08 MED ORDER — FENTANYL CITRATE (PF) 100 MCG/2ML IJ SOLN: 25.0000 ug | INTRAMUSCULAR | Status: DC | PRN

## 2021-10-08 MED ORDER — BUPIVACAINE HCL (PF) 0.25 % IJ SOLN
INTRAMUSCULAR | Status: AC
Start: 2021-10-08 — End: ?
  Filled 2021-10-08: qty 30

## 2021-10-08 MED ORDER — ORAL CARE MOUTH RINSE
15.0000 mL | Freq: Once | OROMUCOSAL | Status: AC
Start: 2021-10-08 — End: 2021-10-08

## 2021-10-08 MED ORDER — AMISULPRIDE (ANTIEMETIC) 5 MG/2ML IV SOLN: 10.0000 mg | Freq: Once | INTRAVENOUS | Status: DC | PRN

## 2021-10-08 MED ORDER — CEFAZOLIN SODIUM-DEXTROSE 2-4 GM/100ML-% IV SOLN
INTRAVENOUS | Status: AC
  Filled 2021-10-08: qty 100

## 2021-10-08 MED ORDER — ROCURONIUM BROMIDE 10 MG/ML (PF) SYRINGE
PREFILLED_SYRINGE | INTRAVENOUS | Status: AC
Start: 2021-10-08 — End: ?
  Filled 2021-10-08: qty 10

## 2021-10-08 MED ORDER — ACETAMINOPHEN 500 MG PO TABS
1000.0000 mg | ORAL_TABLET | Freq: Four times a day (QID) | ORAL | Status: DC
  Administered 2021-10-08 – 2021-10-10 (×6): 1000 mg via ORAL
  Filled 2021-10-08 (×6): qty 2

## 2021-10-08 MED ORDER — DEXAMETHASONE SODIUM PHOSPHATE 10 MG/ML IJ SOLN
INTRAMUSCULAR | Status: DC | PRN
  Administered 2021-10-08: 10 mg via INTRAVENOUS

## 2021-10-08 MED ORDER — SUGAMMADEX SODIUM 200 MG/2ML IV SOLN
INTRAVENOUS | Status: DC | PRN
  Administered 2021-10-08: 200 mg via INTRAVENOUS

## 2021-10-08 MED ORDER — ONDANSETRON HCL 4 MG/2ML IJ SOLN: 4.0000 mg | Freq: Once | INTRAMUSCULAR | Status: DC | PRN

## 2021-10-08 MED ORDER — PHENYLEPHRINE 80 MCG/ML (10ML) SYRINGE FOR IV PUSH (FOR BLOOD PRESSURE SUPPORT)
PREFILLED_SYRINGE | INTRAVENOUS | Status: DC | PRN
  Administered 2021-10-08 (×5): 80 ug via INTRAVENOUS

## 2021-10-08 MED ORDER — ROCURONIUM BROMIDE 10 MG/ML (PF) SYRINGE
PREFILLED_SYRINGE | INTRAVENOUS | Status: DC | PRN
  Administered 2021-10-08: 70 mg via INTRAVENOUS

## 2021-10-08 MED ORDER — SOD CITRATE-CITRIC ACID 500-334 MG/5ML PO SOLN: 30.0000 mL | ORAL | Status: AC

## 2021-10-08 MED ORDER — SOD CITRATE-CITRIC ACID 500-334 MG/5ML PO SOLN
ORAL | Status: AC
  Administered 2021-10-08: 30 mL via ORAL
  Filled 2021-10-08: qty 30

## 2021-10-08 MED ORDER — CHLORHEXIDINE GLUCONATE 0.12 % MT SOLN
OROMUCOSAL | Status: AC
  Administered 2021-10-08: 15 mL via OROMUCOSAL
  Filled 2021-10-08: qty 15

## 2021-10-08 SURGICAL SUPPLY — 46 items
APL SKNCLS STERI-STRIP NONHPOA (GAUZE/BANDAGES/DRESSINGS)
BAG COUNTER SPONGE SURGICOUNT (BAG) ×1 IMPLANT
BAG SPNG CNTER NS LX DISP (BAG) ×1
BENZOIN TINCTURE PRP APPL 2/3 (GAUZE/BANDAGES/DRESSINGS) IMPLANT
CANISTER SUCT 3000ML PPV (MISCELLANEOUS) ×1 IMPLANT
DRAPE WARM FLUID 44X44 (DRAPES) IMPLANT
DRSG OPSITE POSTOP 4X10 (GAUZE/BANDAGES/DRESSINGS) ×1 IMPLANT
DURAPREP 26ML APPLICATOR (WOUND CARE) ×1 IMPLANT
GAUZE 4X4 16PLY ~~LOC~~+RFID DBL (SPONGE) IMPLANT
GAUZE PAD ABD 8X10 STRL (GAUZE/BANDAGES/DRESSINGS) IMPLANT
GAUZE SPONGE 4X4 12PLY STRL (GAUZE/BANDAGES/DRESSINGS) IMPLANT
GLOVE ECLIPSE 6.5 STRL STRAW (GLOVE) ×1 IMPLANT
GLOVE SURG ENC MOIS LTX SZ6 (GLOVE) ×2 IMPLANT
GLOVE SURG UNDER LTX SZ6.5 (GLOVE) ×1 IMPLANT
GOWN STRL REUS W/ TWL LRG LVL3 (GOWN DISPOSABLE) ×2 IMPLANT
GOWN STRL REUS W/ TWL XL LVL3 (GOWN DISPOSABLE) ×1 IMPLANT
GOWN STRL REUS W/TWL LRG LVL3 (GOWN DISPOSABLE) ×2
GOWN STRL REUS W/TWL XL LVL3 (GOWN DISPOSABLE) ×1
HIBICLENS CHG 4% 4OZ BTL (MISCELLANEOUS) ×1 IMPLANT
KIT TURNOVER KIT B (KITS) ×1 IMPLANT
NS IRRIG 1000ML POUR BTL (IV SOLUTION) ×1 IMPLANT
PACK ABDOMINAL GYN (CUSTOM PROCEDURE TRAY) ×1 IMPLANT
PAD ARMBOARD 7.5X6 YLW CONV (MISCELLANEOUS) ×1 IMPLANT
PAD OB MATERNITY 4.3X12.25 (PERSONAL CARE ITEMS) ×1 IMPLANT
RTRCTR C-SECT PINK 25CM LRG (MISCELLANEOUS) IMPLANT
SET CYSTO W/LG BORE CLAMP LF (SET/KITS/TRAYS/PACK) IMPLANT
SHEET LAVH (DRAPES) ×1 IMPLANT
SPIKE FLUID TRANSFER (MISCELLANEOUS) IMPLANT
SPONGE SURGIFOAM ABS GEL 12-7 (HEMOSTASIS) IMPLANT
SPONGE T-LAP 18X18 ~~LOC~~+RFID (SPONGE) IMPLANT
STRIP CLOSURE SKIN 1/2X4 (GAUZE/BANDAGES/DRESSINGS) IMPLANT
SUT PLAIN 2 0 XLH (SUTURE) IMPLANT
SUT VIC AB 0 CT1 18XCR BRD8 (SUTURE) ×3 IMPLANT
SUT VIC AB 0 CT1 27 (SUTURE) ×2
SUT VIC AB 0 CT1 27XBRD ANBCTR (SUTURE) ×2 IMPLANT
SUT VIC AB 0 CT1 8-18 (SUTURE) ×3
SUT VIC AB 2-0 CT1 27 (SUTURE) ×1
SUT VIC AB 2-0 CT1 TAPERPNT 27 (SUTURE) ×1 IMPLANT
SUT VIC AB 3-0 PS2 18 (SUTURE) ×1
SUT VIC AB 3-0 PS2 18XBRD (SUTURE) ×1 IMPLANT
SUT VIC AB 4-0 KS 27 (SUTURE) IMPLANT
SUT VICRYL 0 TIES 12 18 (SUTURE) ×1 IMPLANT
TOWEL GREEN STERILE FF (TOWEL DISPOSABLE) ×2 IMPLANT
TRAY FOL W/BAG SLVR 16FR STRL (SET/KITS/TRAYS/PACK) IMPLANT
TRAY FOLEY W/BAG SLVR 14FR (SET/KITS/TRAYS/PACK) ×1 IMPLANT
TRAY FOLEY W/BAG SLVR 16FR LF (SET/KITS/TRAYS/PACK) ×1

## 2021-10-08 NOTE — Transfer of Care (Signed)
Immediate Anesthesia Transfer of Care Note  Patient: DARBIE BIANCARDI  Procedure(s) Performed: ABDOMINAL HYSTERECTOMY WITH BILATERAL  SALPINGECTOMY (Bilateral: Abdomen)  Patient Location: PACU  Anesthesia Type:General  Level of Consciousness: drowsy, patient cooperative and responds to stimulation  Airway & Oxygen Therapy: Patient Spontanous Breathing and Patient connected to nasal cannula oxygen  Post-op Assessment: Report given to RN, Post -op Vital signs reviewed and stable and Patient moving all extremities X 4  Post vital signs: Reviewed and stable  Last Vitals:  Vitals Value Taken Time  BP 150/90 10/08/21 1019  Temp    Pulse 106 10/08/21 1020  Resp 12 10/08/21 1020  SpO2 100 % 10/08/21 1020  Vitals shown include unvalidated device data.  Last Pain:  Vitals:   10/08/21 0723  TempSrc: Oral  PainSc:          Complications: No notable events documented.

## 2021-10-08 NOTE — H&P (Signed)
Faculty Practice Obstetrics and Gynecology Attending History and Physical  [name] is a 46 y.o. [phone] who presents today for surgery for her fibroids which are symptomatic causing both pressure symptoms and bleeding.   She has done IV iron to help with her anemia. She did Myfembree for about a month but had insurance issues getting coverage. She has pain and SOB from her fibroids.  2007: Uterus was 15 cm, dominant fibroid was 5 cm 2016: Uterus was 17 cm, dominant fibroid was 8.5 cm 2020: Uterus was 19 cm, dominant fibroid was 17cm 2023: Uterus was 22 cm, dominant fibroid is 16 cm  Past Medical History:  Diagnosis Date   Anemia    Asthma    Blood transfusion without reported diagnosis    Gestational diabetes    Uterine fibroid    Past Surgical History:  Procedure Laterality Date   arm surgery     CERVICAL CERCLAGE     CESAREAN SECTION     DILATION AND CURETTAGE OF UTERUS     IR RADIOLOGIST EVAL & MGMT  09/04/2021   TOOTH EXTRACTION     OB History  Gravida Para Term Preterm AB Living  '4 3 2 1 1 2  '$ SAB IAB Ectopic Multiple Live Births  '1     1 3    '$ # Outcome Date GA Lbr Len/2nd Weight Sex Delivery Anes PTL Lv  4 Term 2008 [redacted]w[redacted]d  F CS-LTranv   LIV     Birth Comments: compound presentation  3A Preterm 2005 270w0d     ND     Birth Comments: twins  3B Preterm 2005 2216w0d      2 Term 1998 [id number] g F Vag-Spont   LIV     [facility]: [facility] complications[facility]          Patient denies any other pertinent gynecologic issues.  No current facility-administered medications on file prior to encounter.   Current Outpatient Medications on File Prior to Encounter  Medication Sig Dispense Refill   acetaminophen (TYLENOL) 500 MG tablet Take 1,000 mg by mouth as needed (pain).     albuterol (PROVENTIL HFA;VENTOLIN HFA) 108 (90 Base) MCG/ACT inhaler Inhale 2 puffs into the lungs as needed for wheezing or shortness of breath.     B Complex Vitamins (B  COMPLEX PO) Take 1 capsule by mouth daily. When able to remember     Calcium Carb-Cholecalciferol (CALCIUM + D3 PO) Take 1 tablet by mouth daily. When able to remember     cetirizine (ZYRTEC ALLERGY) 10 MG tablet Take 1 tablet (10 mg total) by mouth daily. (Patient taking differently: Take 10 mg by mouth daily as needed for allergies.) 30 tablet 5   Cyanocobalamin (B-12 PO) Take 1 capsule by mouth daily. When able to remember     fluticasone (FLONASE) 50 MCG/ACT nasal spray Place 1 spray into both nostrils in the morning and at bedtime. (Patient taking differently: Place 1 spray into both nostrils as needed for allergies.) 16 g 2   busPIRone (BUSPAR) 5 MG tablet Take 5 mg by mouth daily. (Patient not taking: Reported on 10/08/2021)     Relugolix-Estradiol-Norethind (MYFEMBREE) 40-1-0.5 MG TABS Take by mouth. (Patient not taking: Reported on 10/08/2021)     Allergies  Allergen Reactions   Shellfish Allergy Itching and Swelling    Throat swells    Other Itching and Swelling    Hazelnut. Swelling of  throat    Latex Itching, Rash and Other (See Comments)    Burning     Social History:   reports that she has never smoked. She has never used smokeless tobacco. She reports that she does not drink alcohol and does not use drugs. Family History  Problem Relation Age of Onset   Hypertension Mother    Hypertension Brother    Breast cancer Maternal Aunt    Hypertension Maternal Uncle     Review of Systems: Pertinent items noted in HPI and remainder of comprehensive ROS otherwise negative.  PHYSICAL EXAM: Blood pressure (!) 127/92, pulse 96, temperature 98.8 F (37.1 C), temperature source Oral, resp. rate 17, height '5\' 4"'$  (1.626 m), weight 89.8 kg, last menstrual period 09/20/2021, SpO2 100 %. CONSTITUTIONAL: Well-developed, well-nourished female in no acute distress.  HENT:  Normocephalic, atraumatic, External right and left ear normal. Oropharynx is clear and moist EYES: Conjunctivae and EOM are  normal. Pupils are equal, round, and reactive to light. No scleral icterus.  NECK: Normal range of motion, supple, no masses SKIN: Skin is warm and dry. No rash noted. Not diaphoretic. No erythema. No pallor. NEUROLOGIC: Alert and oriented to person, place, and time. Normal reflexes, muscle tone coordination. No cranial nerve deficit noted. PSYCHIATRIC: Normal mood and affect. Normal behavior. Normal judgment and thought content. CARDIOVASCULAR: Normal heart rate noted, regular rhythm RESPIRATORY: Effort and breath sounds normal, no problems with respiration noted ABDOMEN: Soft, nontender, Distended by the fibroids PELVIC: Deferred MUSCULOSKELETAL: Normal range of motion. No tenderness.  No cyanosis, clubbing, or edema.  2+ distal pulses.  Labs: Results for orders placed or performed during the hospital encounter of 10/08/21 (from the past 336 hour(s))  Prepare RBC (crossmatch)   Collection Time: 10/08/21  6:38 AM  Result Value Ref Range   Order Confirmation      ORDER PROCESSED BY BLOOD BANK Performed at [facility], [address], [place], [place] [address]   Type and screen [place]   Collection Time: 10/08/21  6:55 AM  Result Value Ref Range   ABO/RH(D) A POS    Antibody Screen NEG    Sample Expiration      10/11/2021,2359 Performed at [facility], [address], [place], [place] [address]    Unit Number [id number]    Blood Component Type RED CELLS,LR    Unit division 00    Status of Unit ALLOCATED    Transfusion Status OK TO TRANSFUSE    Crossmatch Result Compatible    Unit Number [id number]    Blood Component Type RED CELLS,LR    Unit division 00    Status of Unit ALLOCATED    Transfusion Status OK TO TRANSFUSE    Crossmatch Result Compatible   BPAM RBC   Collection Time: 10/08/21  6:55 AM  Result Value Ref Range   Blood Product Unit Number [id number]    PRODUCT CODE E0382V00    Unit Type and Rh 6200    Blood Product  Expiration Date 100712197588    Blood Product Unit Number [id number]    PRODUCT CODE E0382V00    Unit Type and Rh 6200    Blood Product Expiration Date 094076808811   CBC   Collection Time: 10/08/21  7:04 AM  Result Value Ref Range   WBC 4.4 4.0 - 10.5 K/uL   RBC 3.67 (L) 3.87 - 5.11 MIL/uL   Hemoglobin 9.1 (L) 12.0 - 15.0 g/dL   HCT 31.0 (L) 36.0 -  46.0 %   MCV 84.5 80.0 - 100.0 fL   MCH 24.8 (L) 26.0 - 34.0 pg   MCHC 29.4 (L) 30.0 - 36.0 g/dL   RDW 15.5 11.5 - 15.5 %   Platelets 309 150 - 400 K/uL   nRBC 0.0 0.0 - 0.2 %  Basic metabolic panel per protocol   Collection Time: 10/08/21  7:04 AM  Result Value Ref Range   Sodium 139 135 - 145 mmol/L   Potassium 3.5 3.5 - 5.1 mmol/L   Chloride 106 98 - 111 mmol/L   CO2 26 22 - 32 mmol/L   Glucose, Bld 126 (H) 70 - 99 mg/dL   BUN 7 6 - 20 mg/dL   Creatinine, Ser 0.72 0.44 - 1.00 mg/dL   Calcium 9.4 8.9 - 10.3 mg/dL   GFR, Estimated >60 >60 mL/min   Anion gap 7 5 - 15  Pregnancy, urine POC   Collection Time: 10/08/21  7:10 AM  Result Value Ref Range   Preg Test, Ur NEGATIVE NEGATIVE  Glucose, capillary   Collection Time: 10/08/21  7:26 AM  Result Value Ref Range   Glucose-Capillary 120 (H) 70 - 99 mg/dL    Imaging Studies: No results found.  Assessment: Principal Problem:   Fibroids Active Problems:   Fibroid   Plan: - Discussed hysterectomy - reviewed based on size of her uterus, I would recommend a vertical skin incision. We would remove her tubes but leave her ovaries so she would not yet go into menopause unless something requires me to remove the fibroid. Reviewed removal of her cervix. Reviewed risks of surgery and the recovery following the procedure. - Risks of surgery include but are not limited to: bleeding, infection, injury to surrounding organs/tissues (i.e. bowel/bladder/ureters), need for additional procedures, wound complications, hospital re-admission, and conversion to open surgery, VTE - All  questions answered and consent reviewed and signed.  - Will ensure T&C for 2 units.  - Ancef preop   Radene Gunning, MD, [place] for [facility], [place]

## 2021-10-08 NOTE — Anesthesia Postprocedure Evaluation (Signed)
Anesthesia Post Note  Patient: [name]  Procedure(s) Performed: ABDOMINAL HYSTERECTOMY WITH BILATERAL  SALPINGECTOMY (Bilateral: Abdomen)     Patient location during evaluation: PACU Anesthesia Type: General Level of consciousness: awake and alert Pain management: pain level controlled Vital Signs Assessment: post-procedure vital signs reviewed and stable Respiratory status: spontaneous breathing, nonlabored ventilation and respiratory function stable Cardiovascular status: blood pressure returned to baseline and stable Postop Assessment: no apparent nausea or vomiting Anesthetic complications: no   No notable events documented.  Last Vitals:  Vitals:   10/08/21 1130 10/08/21 1145  BP: 125/80 124/75  Pulse: 90 98  Resp: 19 19  Temp:  (!) 36.3 C  SpO2: 100% 100%    Last Pain:  Vitals:   10/08/21 1115  TempSrc:   PainSc: [place]

## 2021-10-08 NOTE — Op Note (Signed)
[name] PROCEDURE DATE: 10/08/2021  PREOPERATIVE DIAGNOSES:  Symptomatic fibroids, abnormal uterine bleeding POSTOPERATIVE DIAGNOSES:  Same SURGEON:  Dr. Damita Dunnings ASSISTANT:  Dr. Rip Harbour.  An experienced assistant was required given the standard of surgical care given the complexity of the case.  This assistant was needed for exposure, dissection, suctioning, retraction, instrument exchange, and for overall help during the procedure.  OPERATION:  Total abdominal hysterectomy, Bilateral Salpingectomy  ANESTHESIA:  General endotracheal.  INDICATIONS: The patient is a 46 y.o. [id number] with the aforementioned diagnoses who desires definitive surgical management. On the preoperative visit, the risks, benefits, indications, and alternatives of the procedure were reviewed with the patient.  On the day of surgery, the risks of surgery were again discussed with the patient including but not limited to: bleeding which may require transfusion or reoperation; infection which may require antibiotics; injury to bowel, bladder, ureters or other surrounding organs; need for additional procedures; thromboembolic phenomenon, incisional problems and other postoperative/anesthesia complications. Written informed consent was obtained.    OPERATIVE FINDINGS: A 28 week size uterus with normal tubes and ovaries bilaterally.  ESTIMATED BLOOD LOSS: 600 ml FLUIDS:  1000 ml of Lactated Ringers, 500 ml of albumin URINE OUTPUT:  50 ml of clear yellow urine. SPECIMENS:  Uterus, cervix,  bilateral fallopian tubes sent to pathology COMPLICATIONS:  None immediate.   DESCRIPTION OF PROCEDURE:  The patient received intravenous antibiotics and had sequential compression devices applied to her lower extremities while in the preoperative area.   She was taken to the operating room and placed under general anesthesia without difficulty.The abdomen and perineum were prepped and draped in a sterile manner, and she was placed in a  dorsal supine position.  A Foley catheter was inserted into the bladder and attached to constant drainage.   After an adequate timeout was performed, a Pfannensteil skin incision was made. This incision was taken down to the fascia using the scalpel with care given to maintain good hemostasis. The fascia was incised in the midline and the fascial incision was then extended bilaterally without difficulty. The fascia was then dissected off the underlying rectus muscles using blunt dissection. The rectus muscles were split bluntly in the midline and the peritoneum entered sharply without complication. This peritoneal incision was then extended superiorly and inferiorly with care given to prevent bowel or bladder injury. Attention was then turned to the pelvis. The uterus at this point was noted to be mobilized and was delivered up out of the abdomen.  The bowel was naturally out of the way without retractor from the size of the uterus. The round ligaments on each side were clamped, suture ligated with 0 Vicryl, and transected with may scissors allowing entry into the broad ligament. Of note, all sutures used in this procedure are 0 Vicryl unless otherwise noted. The anterior and posterior leaves of the broad ligament were separated, and the ureters were inspected to be safely away from the area of dissection bilaterally.    Adnexa were clamped on the patient's right side, cut, and doubly suture ligated. This procedure was repeated in an identical fashion on the left site allowing for both adnexa to remain in place.  Kelly clamps were placed on the mesosalpinx of the right fallopian tube, and the fallopian tube was excised.  The pedicle was then secured with a free tie.  A similar process was carried out on the left side, allowing for bilateral salpingectomy.  Some areas were oozing bilaterally along the IP pedicle and these  were oversewn to achieve excellent hemostasis.   A bladder flap was then created.  The  bladder was then bluntly dissected off the lower uterine segment and cervix with good hemostasis noted. The uterine arteries were then skeletonized bilaterally and then clamped, cut, and doubly suture ligated with care given to prevent ureteral injury. The uterosacral ligaments were then clamped, cut, and ligated bilaterally.  Finally, the cardinal ligaments were clamped, cut, and ligated bilaterally.  Acutely curved clamps were placed across the vagina just under the cervix, and the specimen was amputated and sent to pathology. The vaginal cuff angles were closed with Heaney stiches with care given to incorporate the uterosacral-cardinal ligament pedicles on both sides. The middle of the vaginal cuff was closed with a series of interrupted figure-of-eight sutures with care given to incorporate the anterior pubocervical fascia and the posterior rectovaginal fascia.     The pelvis was irrigated and hemostasis was reconfirmed at all pedicles and along the pelvic sidewall.  Some areas of oozing along the peritoneal edge were noted and oversewn in a running fashion along the peritoneal edge. All laparotomy sponges and instruments were removed from the abdomen. The peritoneum was closed with a running stitch with 2-0 vicryl, and the fascia was also closed in a running fashion with 0 vicryl. The subcutaneous layer was reapproximated with 2-0 plain gut. The skin was closed with a 4-0 Vicryl subcuticular stitch. Sponge, lap, needle, and instrument counts were correct times two. The patient was taken to the recovery area awake, extubated and in stable condition.  Radene Gunning MD, [place] for [facility], [place]

## 2021-10-08 NOTE — Telephone Encounter (Signed)
Left message to return call 

## 2021-10-08 NOTE — Anesthesia Procedure Notes (Addendum)
Procedure Name: Intubation Date/Time: 10/08/2021 8:41 AM  Performed by: Michele Rockers, CRNAPre-anesthesia Checklist: Patient identified, Patient being monitored, Timeout performed, Emergency Drugs available and Suction available Patient Re-evaluated:Patient Re-evaluated prior to induction Oxygen Delivery Method: Circle system utilized Preoxygenation: Pre-oxygenation with 100% oxygen Induction Type: IV induction Ventilation: Mask ventilation without difficulty Laryngoscope Size: Miller and 2 Grade View: Grade I Tube type: Oral Tube size: 7.0 mm Number of attempts: 1 Airway Equipment and Method: Stylet Placement Confirmation: ETT inserted through vocal cords under direct vision, positive ETCO2 and breath sounds checked- equal and bilateral Secured at: 20 cm Tube secured with: Tape Dental Injury: Teeth and Oropharynx as per pre-operative assessment

## 2021-10-08 NOTE — Progress Notes (Signed)
Patient arrived to [place] room 9. Alert and oriented x4. Pain level 4/10. Abdominal incision with skin glue clean dry and intact, gauze and tape in place also clean dry and intact. Bed in lowest position, call light in reach. PCA button in reach. Will continue to monitor pt.

## 2021-10-08 NOTE — Procedures (Addendum)
Entered in error

## 2021-10-09 ENCOUNTER — Telehealth: Payer: Self-pay | Admitting: Family Medicine

## 2021-10-09 ENCOUNTER — Encounter (HOSPITAL_COMMUNITY): Payer: Self-pay | Admitting: Obstetrics and Gynecology

## 2021-10-09 DIAGNOSIS — D62 Acute posthemorrhagic anemia: Secondary | ICD-10-CM

## 2021-10-09 HISTORY — DX: Acute posthemorrhagic anemia: D62

## 2021-10-09 LAB — CBC
HCT: 20.3 % — ABNORMAL LOW (ref 36.0–46.0)
HCT: 27.6 % — ABNORMAL LOW (ref 36.0–46.0)
Hemoglobin: 6 g/dL — CL (ref 12.0–15.0)
Hemoglobin: 8.9 g/dL — ABNORMAL LOW (ref 12.0–15.0)
MCH: 24.5 pg — ABNORMAL LOW (ref 26.0–34.0)
MCH: 26.8 pg (ref 26.0–34.0)
MCHC: 29.6 g/dL — ABNORMAL LOW (ref 30.0–36.0)
MCHC: 32.2 g/dL (ref 30.0–36.0)
MCV: 82.9 fL (ref 80.0–100.0)
MCV: 83.1 fL (ref 80.0–100.0)
Platelets: 248 10*3/uL (ref 150–400)
Platelets: 234 10*3/uL (ref 150–400)
RBC: 2.45 MIL/uL — ABNORMAL LOW (ref 3.87–5.11)
RBC: 3.32 MIL/uL — ABNORMAL LOW (ref 3.87–5.11)
RDW: 15.5 % (ref 11.5–15.5)
RDW: 15.1 % (ref 11.5–15.5)
WBC: 6.8 10*3/uL (ref 4.0–10.5)
WBC: 6.4 10*3/uL (ref 4.0–10.5)
nRBC: 0 % (ref 0.0–0.2)
nRBC: 0 % (ref 0.0–0.2)

## 2021-10-09 LAB — PREPARE RBC (CROSSMATCH)

## 2021-10-09 MED ORDER — SODIUM CHLORIDE 0.9% IV SOLUTION: Freq: Once | INTRAVENOUS | Status: AC

## 2021-10-09 MED ORDER — IBUPROFEN 600 MG PO TABS: 600.0000 mg | ORAL_TABLET | Freq: Four times a day (QID) | ORAL | Status: DC | PRN

## 2021-10-09 MED ORDER — FUROSEMIDE 10 MG/ML IJ SOLN
20.0000 mg | Freq: Once | INTRAMUSCULAR | Status: AC
  Administered 2021-10-09: 20 mg via INTRAVENOUS
  Filled 2021-10-09: qty 2

## 2021-10-09 MED ORDER — ACETAMINOPHEN 500 MG PO TABS
1000.0000 mg | ORAL_TABLET | Freq: Once | ORAL | Status: AC
  Administered 2021-10-09: 1000 mg via ORAL
  Filled 2021-10-09: qty 2

## 2021-10-09 MED ORDER — DIPHENHYDRAMINE HCL 25 MG PO CAPS
25.0000 mg | ORAL_CAPSULE | Freq: Once | ORAL | Status: AC
  Administered 2021-10-09: 25 mg via ORAL
  Filled 2021-10-09: qty 1

## 2021-10-09 MED ORDER — OXYCODONE HCL 5 MG PO TABS: 5.0000 mg | ORAL_TABLET | ORAL | Status: DC | PRN

## 2021-10-09 MED ORDER — PANTOPRAZOLE SODIUM 40 MG PO TBEC
40.0000 mg | DELAYED_RELEASE_TABLET | Freq: Every day | ORAL | Status: DC
  Administered 2021-10-09: 40 mg via ORAL
  Filled 2021-10-09: qty 1

## 2021-10-09 NOTE — Telephone Encounter (Signed)
PCP has been updated and 3 month fu scheduled.

## 2021-10-09 NOTE — Progress Notes (Signed)
Orders placed by MD to discontinue PCA pump, pump  discontinued 25 mg wasted in appropriate waste container.  Tameika  LPN and Norvel Richards RN witnessed waste.

## 2021-10-09 NOTE — Progress Notes (Signed)
    Faculty Practice OB/GYN Attending Note  Subjective:  She is feeling better already this morning as blood is going. She is passing gas. She has stood at bedside. Her pain is well controlled and last PCA use was about 6 hours previously.   Admitted on 10/08/2021 for Fibroids, underwent Total Abdominal Hysterectomy, Bilateral Salpingectomy    Objective:  Blood pressure 127/76, pulse 83, temperature 97.8 F (36.6 C), temperature source Oral, resp. rate 14, height '5\' 4"'$  (1.626 m), weight 89.8 kg, last menstrual period 09/20/2021, SpO2 100 %.  I/O last 3 completed shifts: In: 3082.1 [P.O.:240; I.V.:1926.1; Blood:316; IV Piggyback:600] Out: 1600 [Urine:1000; Blood:600]  Gen: NAD HENT: Normocephalic, atraumatic Lungs: Normal respiratory effort Heart: Regular rate noted Abdomen: NT  soft, pressure dressing changed to honeycomb and steris.  Pelvic: Deferred Ext: 2+ DTRs, no edema, no cyanosis, negative Homan's sign     Latest Ref Rng & Units 10/09/2021   12:27 AM 10/08/2021   10:44 AM 10/08/2021    7:04 AM  CBC  WBC 4.0 - 10.5 K/uL 6.8  6.3  4.4   Hemoglobin 12.0 - 15.0 g/dL 6.0  7.6  9.1   Hematocrit 36.0 - 46.0 % 20.3  25.8  31.0   Platelets 150 - 400 K/uL 248  266  309     Assessment & Plan:  46 y.o. [id number] POD#1 s/p TAH, BS complicated by postoperative anemia due to acute blood loss, history of anemia due to chronic blood loss prior to surgery.   - Blood transfusion in progress. Plan is for post-transfusion hemoglobin.  - Excellent UOP - will d/c foley - Advance to regular diet - Change PCA to PO meds - She is meeting postop goals. Anticipate likely d/c tomorrow.     Radene Gunning, MD Obstetrician & Gynecologist, [facility] for [facility], [place]

## 2021-10-09 NOTE — Telephone Encounter (Signed)
It is okay for patient to transfer.

## 2021-10-09 NOTE — Telephone Encounter (Signed)
[name] has reached out to start the [facility] process to [place].    Patient states her daughter has already spoken to someone with Dr. Kathrin Ruddy team and she was to have been transferred already.    Please advise in regard to next appt?

## 2021-10-09 NOTE — Progress Notes (Signed)
    Faculty Practice OB/GYN Attending Note  Subjective:  Called by RN and informed of Hgb of 6.0, down from 7.6 earlier today.  Preoperative hemoglobin prior to hysterectomy was 9.1.  Patient is feeling very lightheaded and dizzy upon getting out of bed. Minimal vaginal bleeding reported.   Admitted on 10/08/2021 for Fibroids, underwent Total Abdominal Hysterectomy, Bilateral Salpingectomy    Objective:  Blood pressure 128/74, pulse 74, temperature 97.8 F (36.6 C), temperature source Oral, resp. rate 18, height '5\' 4"'$  (1.626 m), weight 89.8 kg, last menstrual period 09/20/2021, SpO2 100 %.  I/O last 3 completed shifts: In: 1750 [I.V.:1150; IV Piggyback:600] Out: 900 [Urine:300; Blood:600]  Gen: NAD HENT: Normocephalic, atraumatic Lungs: Normal respiratory effort Heart: Regular rate noted Abdomen: NT  soft Pelvic: Deferred Ext: 2+ DTRs, no edema, no cyanosis, negative Homan's sign     Latest Ref Rng & Units 10/09/2021   12:27 AM 10/08/2021   10:44 AM 10/08/2021    7:04 AM  CBC  WBC 4.0 - 10.5 K/uL 6.8  6.3  4.4   Hemoglobin 12.0 - 15.0 g/dL 6.0  7.6  9.1   Hematocrit 36.0 - 46.0 % 20.3  25.8  31.0   Platelets 150 - 400 K/uL 248  266  309     Assessment & Plan:  47 y.o. [id number] POD#1 s/p TAH, BS complicated by postoperative anemia due to acute blood loss, history of anemia due to chronic blood loss prior to surgery.  Patient is symptomatic. Benign examination, no active bleeding noted or suspected. Given level of anemia, patient was counseled regarding transfusion, and she agreed to this. 3 pRBCs ordered to be transfused.  Will get post transfusion hemoglobin later today.  She has good UOP so far.  Will continue close observation.   Verita Schneiders, MD, [place] for [facility], [place]

## 2021-10-09 NOTE — Progress Notes (Signed)
Mobility Specialist - Progress Note   10/09/21 1223  Mobility  Activity Ambulated with assistance in hallway  Level of Assistance Standby assist, set-up cues, supervision of patient - no hands on  Assistive Device Other (Comment) (IV Pole)  Distance Ambulated (ft) 700 ft  Activity Response Tolerated well  $Mobility charge 1 Mobility    Pt received in recliner agreeable to mobility. No complaints throughout, took seated break x1. Left in recliner w/ call bell in reach and all needs met.   Paulla Dolly Mobility Specialist

## 2021-10-10 LAB — TYPE AND SCREEN
ABO/RH(D): A POS
Antibody Screen: NEGATIVE
Unit division: 0
Unit division: 0

## 2021-10-10 LAB — BPAM RBC
Blood Product Expiration Date: 202309292359
Blood Product Expiration Date: 202309292359
ISSUE DATE / TIME: 202309070239
ISSUE DATE / TIME: 202309070536
Unit Type and Rh: 6200
Unit Type and Rh: 6200

## 2021-10-10 LAB — SURGICAL PATHOLOGY

## 2021-10-10 MED ORDER — ACETAMINOPHEN 500 MG PO TABS: 1000.0000 mg | ORAL_TABLET | Freq: Four times a day (QID) | ORAL | 0 refills | Status: DC

## 2021-10-10 MED ORDER — IBUPROFEN 600 MG PO TABS: 600.0000 mg | ORAL_TABLET | Freq: Four times a day (QID) | ORAL | 0 refills | Status: DC | PRN

## 2021-10-10 MED ORDER — ONDANSETRON 4 MG PO TBDP: 4.0000 mg | ORAL_TABLET | Freq: Four times a day (QID) | ORAL | 0 refills | Status: DC | PRN

## 2021-10-10 MED ORDER — OXYCODONE HCL 5 MG PO TABS: 5.0000 mg | ORAL_TABLET | ORAL | 0 refills | Status: DC | PRN

## 2021-10-10 NOTE — Discharge Summary (Signed)
Gynecology Physician Postoperative Discharge Summary  Patient ID: [name] MRN: [id number] DOB/AGE: September 08, 1975 46 y.o.  Admit Date: 10/08/2021 Discharge Date: 10/10/2021  Preoperative Diagnoses: Symptomatic fibroids, menorrhagia, pelvic pain  Procedures: Procedure(s) (LRB): ABDOMINAL HYSTERECTOMY WITH BILATERAL  SALPINGECTOMY (Bilateral)  Hospital Course:  [name] is a 45 y.o. [id number]  admitted for scheduled surgery.  She underwent the procedures as mentioned above, her operation was uncomplicated. For further details about surgery, please refer to the operative report. Patient had an uncomplicated postoperative course. By time of discharge on POD#2, her pain was controlled on oral pain medications; she was ambulating, voiding without difficulty, tolerating regular diet and passing flatus. She was deemed stable for discharge to home.   Significant Labs:    Latest Ref Rng & Units 10/09/2021    2:08 PM 10/09/2021   12:27 AM 10/08/2021   10:44 AM  CBC  WBC 4.0 - 10.5 K/uL 6.4  6.8  6.3   Hemoglobin 12.0 - 15.0 g/dL 8.9  6.0  7.6   Hematocrit 36.0 - 46.0 % 27.6  20.3  25.8   Platelets 150 - 400 K/uL 234  248  266     Discharge Exam: Blood pressure 127/75, pulse 74, temperature 98.2 F (36.8 C), temperature source Oral, resp. rate 18, height '5\' 4"'$  (1.626 m), weight 89.8 kg, last menstrual period 09/20/2021, SpO2 99 %. General appearance: alert and no distress  Resp: clear to auscultation bilaterally  Cardio: regular rate and rhythm  GI: soft, non-tender; bowel sounds normal; no masses, no organomegaly.  Incision: C/D/I, no erythema, no drainage noted on honeycomb Extremities: extremities normal, atraumatic, no cyanosis or edema and Homans sign is negative, no sign of DVT  Discharged Condition: Stable  Disposition: Discharge disposition: 01-Home or Self Care       Discharge Instructions     Activity as tolerated - No restrictions   Complete by: As directed    Call MD  for:  difficulty breathing, headache or visual disturbances   Complete by: As directed    Call MD for:  persistant nausea and vomiting   Complete by: As directed    Call MD for:  redness, tenderness, or signs of infection (pain, swelling, redness, odor or green/yellow discharge around incision site)   Complete by: As directed    Call MD for:  severe uncontrolled pain   Complete by: As directed    Call MD for:  temperature >100.4   Complete by: As directed    Diet - low sodium heart healthy   Complete by: As directed    Discharge wound care:   Complete by: As directed    If dressing is present, remove after 7 days from surgery   Driving Restrictions   Complete by: As directed    When not taking narcotic pain medication and would not hesitate to use the breaks, usually about 7 days   May shower / Bathe   Complete by: As directed       Allergies as of 10/10/2021       Reactions   Shellfish Allergy Itching, Swelling   Throat swells    Other Itching, Swelling   Hazelnut. Swelling of throat    Latex Itching, Rash, Other (See Comments)   Burning         Medication List     STOP taking these medications    Fusion Plus Caps   megestrol 40 MG tablet Commonly known as: MEGACE   Myf[place] 40-1-0.5 MG Tabs  Generic drug: Relugolix-Estradiol-Norethind       TAKE these medications    acetaminophen 500 MG tablet Commonly known as: TYLENOL Take 2 tablets (1,000 mg total) by mouth every 6 (six) hours. What changed:  when to take this reasons to take this   albuterol 108 (90 Base) MCG/ACT inhaler Commonly known as: VENTOLIN HFA Inhale 2 puffs into the lungs as needed for wheezing or shortness of breath.   B COMPLEX PO Take 1 capsule by mouth daily. When able to remember   B-12 PO Take 1 capsule by mouth daily. When able to remember   CALCIUM + D3 PO Take 1 tablet by mouth daily. When able to remember   cetirizine 10 MG tablet Commonly known as: ZyrTEC  Allergy Take 1 tablet (10 mg total) by mouth daily. What changed:  when to take this reasons to take this   escitalopram 20 MG tablet Commonly known as: LEXAPRO Take 1 tablet (20 mg total) by mouth daily. What changed:  when to take this additional instructions   fluticasone 50 MCG/ACT nasal spray Commonly known as: FLONASE Place 1 spray into both nostrils in the morning and at bedtime. What changed:  when to take this reasons to take this   ibuprofen 600 MG tablet Commonly known as: ADVIL Take 1 tablet (600 mg total) by mouth every 6 (six) hours as needed for fever or headache.   ondansetron 4 MG disintegrating tablet Commonly known as: ZOFRAN-ODT Take 1 tablet (4 mg total) by mouth every 6 (six) hours as needed for nausea.   oxyCODONE 5 MG immediate release tablet Commonly known as: Oxy IR/ROXICODONE Take 1-2 tablets (5-10 mg total) by mouth every 4 (four) hours as needed for severe pain.               Discharge Care Instructions  (From admission, onward)           Start     Ordered   10/10/21 0000  Discharge wound care:       Comments: If dressing is present, remove after 7 days from surgery   10/10/21 0902           Future Appointments  Date Time Provider [place]  11/14/2021 10:30 AM [facility] None  11/14/2021 10:45 AM Celso Amy, NP [facility] None  12/05/2021 11:00 AM Tawnya Crook, MD [facility] [place] [facility] [place] [facility] [place] Follow up in 2 week(s).   Specialty: Obstetrics and Gynecology Why: incision check Contact information: [place], [place] [phone]                Total discharge time: 30 minutes   Signed:  Radene Gunning, MD, [place] Attending [place], [facility]

## 2021-10-10 NOTE — Progress Notes (Signed)
[name] to be D/C'd  per MD order.  Discussed with the patient and all questions fully answered.  VSS, Skin clean, dry and intact without evidence of skin break down, no evidence of skin tears noted.  IV catheter discontinued intact. Site without signs and symptoms of complications. Dressing and pressure applied.  An After Visit Summary was printed and given to the patient.   D/c education completed with patient/family including follow up instructions, medication list, d/c activities limitations if indicated, with other d/c instructions as indicated by MD - patient able to verbalize understanding, all questions fully answered.   Patient instructed to return to ED, call 911, or call MD for any changes in condition.   Patient to be escorted via [place], and D/C home via private auto.

## 2021-10-10 NOTE — Telephone Encounter (Signed)
Yes, she would like Lexapro sent to the pharmacy.

## 2021-10-10 NOTE — Progress Notes (Signed)
Mobility Specialist - Progress Note   10/10/21 0947  Mobility  Activity Ambulated independently in hallway  Level of Assistance Independent  Assistive Device None  Distance Ambulated (ft) 300 ft  Activity Response Tolerated well  $Mobility charge 1 Mobility    Pt received on couch in room and agreeable to mobility. Left in room w/ call bell in reach and all needs met.   Paulla Dolly Mobility Specialist

## 2021-10-10 NOTE — Telephone Encounter (Signed)
Patient stated that she was concerned about surgery, she has had surgery and is taking it easy, she will follow-up in 2 weeks with gyn.

## 2021-10-11 ENCOUNTER — Other Ambulatory Visit: Payer: Self-pay | Admitting: Family Medicine

## 2021-10-11 MED ORDER — ESCITALOPRAM OXALATE 20 MG PO TABS
20.0000 mg | ORAL_TABLET | Freq: Every day | ORAL | 1 refills | Status: DC
Start: 2021-10-11 — End: 2021-12-05

## 2021-10-22 NOTE — Progress Notes (Unsigned)
   GYNECOLOGY OFFICE VISIT NOTE  History:   [name] is a 46 y.o. [phone] here today for postop check. She had a TAH, bilateral salpinectomy on 10/08/21. Her surgery was without complication.  She had a blood transfusion due to chronic blood loss prior to surgery but also acute blood loss as expected with the surgery.   She is recovering well. She has minimal pain. Some upper abdominal itching but no rash    Past Medical History:  Diagnosis Date   Anemia    Asthma    Blood transfusion without reported diagnosis    Gestational diabetes    Uterine fibroid     Past Surgical History:  Procedure Laterality Date   arm surgery     CERVICAL CERCLAGE     CESAREAN SECTION     DILATION AND CURETTAGE OF UTERUS     HYSTERECTOMY ABDOMINAL WITH SALPINGECTOMY Bilateral 10/08/2021   Procedure: ABDOMINAL HYSTERECTOMY WITH BILATERAL  SALPINGECTOMY;  Surgeon: Radene Gunning, MD;  Location: [facility];  Service: Gynecology;  Laterality: Bilateral;   IR RADIOLOGIST EVAL & MGMT  09/04/2021   TOOTH EXTRACTION      The following portions of the patient's history were reviewed and updated as appropriate: allergies, current medications, past family history, past medical history, past social history, past surgical history and problem list.   Health Maintenance:   Normal mammogram on 2016.   Review of Systems:  Pertinent items noted in HPI and remainder of comprehensive ROS otherwise negative.  Physical Exam:  BP 122/78   Pulse 84   Ht '5\' 4"'$  (1.626 m)   Wt 191 lb (86.6 kg)   LMP 08/13/2021 (Approximate)   BMI 32.79 kg/m  CONSTITUTIONAL: Well-developed, well-nourished female in no acute distress.  HEENT:  Normocephalic, atraumatic. External right and left ear normal. No scleral icterus.  NECK: Normal range of motion, supple, no masses noted on observation SKIN: No rash noted. Not diaphoretic. No erythema. No pallor. MUSCULOSKELETAL: Normal range of motion. No edema noted. NEUROLOGIC: Alert and  oriented to person, place, and time. Normal muscle tone coordination. No cranial nerve deficit noted. PSYCHIATRIC: Normal mood and affect. Normal behavior. Normal judgment and thought content.  CARDIOVASCULAR: Normal heart rate noted RESPIRATORY: Effort and breath sounds normal, no problems with respiration noted ABDOMEN: No masses noted. No other overt distention noted.  Incision c/d/I   Labs and Imaging No results found for this or any previous visit (from the past 168 hour(s)). No results found.  Assessment and Plan:   1. Postop check Continue current  management. Reviewed safe wound care for the next week weeks (I.e. cleanser, neosporin prn). After that she may use what she wishes Nothing still in the vagina until cleared by me Discussed no heavy lifting until after the 4 week mark.   2. Breast cancer screening by mammogram -     MM 3D SCREEN BREAST BILATERAL; Future    Routine preventative health maintenance measures emphasized. Please refer to After Visit Summary for other counseling recommendations.   No follow-ups on file.  Radene Gunning, MD, [place] for [facility], [place]

## 2021-10-23 ENCOUNTER — Encounter: Payer: Self-pay | Admitting: Obstetrics and Gynecology

## 2021-10-23 ENCOUNTER — Ambulatory Visit (INDEPENDENT_AMBULATORY_CARE_PROVIDER_SITE_OTHER): Payer: Medicaid Other | Admitting: Obstetrics and Gynecology

## 2021-10-23 VITALS — BP 122/78 | HR 84 | Ht 64.0 in | Wt 191.0 lb

## 2021-10-23 DIAGNOSIS — Z1231 Encounter for screening mammogram for malignant neoplasm of breast: Secondary | ICD-10-CM

## 2021-10-23 DIAGNOSIS — Z09 Encounter for follow-up examination after completed treatment for conditions other than malignant neoplasm: Secondary | ICD-10-CM

## 2021-10-30 ENCOUNTER — Encounter: Payer: Self-pay | Admitting: Family

## 2021-11-03 ENCOUNTER — Telehealth: Payer: Self-pay | Admitting: *Deleted

## 2021-11-03 NOTE — Telephone Encounter (Signed)
Called and lvm about rescheduling appointment with sarah - requested call back to confirm

## 2021-11-07 ENCOUNTER — Other Ambulatory Visit: Payer: Self-pay | Admitting: *Deleted

## 2021-11-07 DIAGNOSIS — Z1231 Encounter for screening mammogram for malignant neoplasm of breast: Secondary | ICD-10-CM

## 2021-11-13 ENCOUNTER — Inpatient Hospital Stay: Payer: BC Managed Care – PPO | Attending: Family | Admitting: Medical Oncology

## 2021-11-13 ENCOUNTER — Encounter: Payer: Self-pay | Admitting: Medical Oncology

## 2021-11-13 ENCOUNTER — Inpatient Hospital Stay: Payer: BC Managed Care – PPO

## 2021-11-13 VITALS — BP 108/64 | HR 92 | Temp 98.4°F | Resp 17 | Wt 192.0 lb

## 2021-11-13 DIAGNOSIS — Z9889 Other specified postprocedural states: Secondary | ICD-10-CM

## 2021-11-13 DIAGNOSIS — Z90710 Acquired absence of both cervix and uterus: Secondary | ICD-10-CM | POA: Insufficient documentation

## 2021-11-13 DIAGNOSIS — D509 Iron deficiency anemia, unspecified: Secondary | ICD-10-CM | POA: Diagnosis not present

## 2021-11-13 DIAGNOSIS — N920 Excessive and frequent menstruation with regular cycle: Secondary | ICD-10-CM | POA: Insufficient documentation

## 2021-11-13 DIAGNOSIS — D500 Iron deficiency anemia secondary to blood loss (chronic): Secondary | ICD-10-CM | POA: Diagnosis not present

## 2021-11-13 DIAGNOSIS — D259 Leiomyoma of uterus, unspecified: Secondary | ICD-10-CM | POA: Insufficient documentation

## 2021-11-13 DIAGNOSIS — Z79899 Other long term (current) drug therapy: Secondary | ICD-10-CM | POA: Diagnosis not present

## 2021-11-13 DIAGNOSIS — Z86018 Personal history of other benign neoplasm: Secondary | ICD-10-CM | POA: Diagnosis not present

## 2021-11-13 DIAGNOSIS — D709 Neutropenia, unspecified: Secondary | ICD-10-CM | POA: Diagnosis not present

## 2021-11-13 LAB — CBC WITH DIFFERENTIAL (CANCER CENTER ONLY)
Abs Immature Granulocytes: 0 10*3/uL (ref 0.00–0.07)
Basophils Absolute: 0.1 10*3/uL (ref 0.0–0.1)
Basophils Relative: 2 %
Eosinophils Absolute: 0.2 10*3/uL (ref 0.0–0.5)
Eosinophils Relative: 6 %
HCT: 35.1 % — ABNORMAL LOW (ref 36.0–46.0)
Hemoglobin: 10.7 g/dL — ABNORMAL LOW (ref 12.0–15.0)
Immature Granulocytes: 0 %
Lymphocytes Relative: 46 %
Lymphs Abs: 1.6 10*3/uL (ref 0.7–4.0)
MCH: 23.8 pg — ABNORMAL LOW (ref 26.0–34.0)
MCHC: 30.5 g/dL (ref 30.0–36.0)
MCV: 78 fL — ABNORMAL LOW (ref 80.0–100.0)
Monocytes Absolute: 0.2 10*3/uL (ref 0.1–1.0)
Monocytes Relative: 7 %
Neutro Abs: 1.3 10*3/uL — ABNORMAL LOW (ref 1.7–7.7)
Neutrophils Relative %: 39 %
Platelet Count: 249 10*3/uL (ref 150–400)
RBC: 4.5 MIL/uL (ref 3.87–5.11)
RDW: 15.3 % (ref 11.5–15.5)
WBC Count: 3.5 10*3/uL — ABNORMAL LOW (ref 4.0–10.5)
nRBC: 0 % (ref 0.0–0.2)

## 2021-11-13 LAB — RETICULOCYTES
Immature Retic Fract: 14.6 % (ref 2.3–15.9)
RBC.: 4.51 MIL/uL (ref 3.87–5.11)
Retic Count, Absolute: 66.3 10*3/uL (ref 19.0–186.0)
Retic Ct Pct: 1.5 % (ref 0.4–3.1)

## 2021-11-13 LAB — FERRITIN: Ferritin: 8 ng/mL — ABNORMAL LOW (ref 11–307)

## 2021-11-13 LAB — IRON AND IRON BINDING CAPACITY (CC-WL,HP ONLY)
Iron: 16 ug/dL — ABNORMAL LOW (ref 28–170)
Saturation Ratios: 5 % — ABNORMAL LOW (ref 10.4–31.8)
TIBC: 354 ug/dL (ref 250–450)
UIBC: 338 ug/dL (ref 148–442)

## 2021-11-13 MED ORDER — FUSION PLUS PO CAPS: 1.0000 | ORAL_CAPSULE | Freq: Every morning | ORAL | 2 refills | Status: DC

## 2021-11-13 NOTE — Progress Notes (Signed)
Hematology and Oncology Follow Up Visit  [name] [id number] 1975/10/30 46 y.o. 11/13/2021   Principle Diagnosis:  Iron deficiency anemia  Current Therapy:   IV iron as indicated   Interim History:  [name] is here today with her mother for follow-up and her daughter attending virtually.  Since her last visit she had a successful hysterectomy on Sept 06th. She reports that she is healing well and has her final follow up visit with her OB-GYN in the first week of November to be cleared to go back to her normal activities.  No blood loss, pain, fevers, chest pains, calf pains.  Feeling like she is getting her energy back little by little  No fever, chills, n/v, cough, rash, dizziness, chest pain, palpitations, abdominal pain or changes in bowel or bladder habits.  No swelling, tenderness, numbness or tingling in her extremities.  No falls or syncope.  Appetite and hydration are good.  Wt Readings from Last 3 Encounters:  11/13/21 192 lb (87.1 kg)  10/23/21 191 lb (86.6 kg)  10/08/21 198 lb (89.8 kg)     ECOG Performance Status: 1 - Symptomatic but completely ambulatory  Medications:  Allergies as of 11/13/2021       Reactions   Shellfish Allergy Itching, Swelling   Throat swells    Other Itching, Swelling   Hazelnut. Swelling of throat    Latex Itching, Rash, Other (See Comments)   Burning         Medication List        Accurate as of November 13, 2021 12:19 PM. If you have any questions, ask your nurse or doctor.          STOP taking these medications    B COMPLEX PO Stopped by: Hughie Closs, PA-C       TAKE these medications    acetaminophen 500 MG tablet Commonly known as: TYLENOL Take 2 tablets (1,000 mg total) by mouth every 6 (six) hours.   albuterol 108 (90 Base) MCG/ACT inhaler Commonly known as: VENTOLIN HFA Inhale 2 puffs into the lungs as needed for wheezing or shortness of breath.   B-12 PO Take 1 capsule by mouth daily.  When able to remember   CALCIUM + D3 PO Take 1 tablet by mouth daily. When able to remember   cetirizine 10 MG tablet Commonly known as: ZyrTEC Allergy Take 1 tablet (10 mg total) by mouth daily.   escitalopram 20 MG tablet Commonly known as: LEXAPRO Take 1 tablet (20 mg total) by mouth daily.   fluticasone 50 MCG/ACT nasal spray Commonly known as: FLONASE Place 1 spray into both nostrils in the morning and at bedtime.   Fusion Plus Caps Take 1 tablet by mouth in the morning. Started by: Hughie Closs, PA-C   ibuprofen 600 MG tablet Commonly known as: ADVIL Take 1 tablet (600 mg total) by mouth every 6 (six) hours as needed for fever or headache.   ondansetron 4 MG disintegrating tablet Commonly known as: ZOFRAN-ODT Take 1 tablet (4 mg total) by mouth every 6 (six) hours as needed for nausea.   oxyCODONE 5 MG immediate release tablet Commonly known as: Oxy IR/ROXICODONE Take 1-2 tablets (5-10 mg total) by mouth every 4 (four) hours as needed for severe pain.        Allergies:  Allergies  Allergen Reactions   Shellfish Allergy Itching and Swelling    Throat swells    Other Itching and Swelling    Hazelnut. Swelling  of throat    Latex Itching, Rash and Other (See Comments)    Burning     Past Medical History, Surgical history, Social history, and Family History were reviewed and updated.  Review of Systems: All other 10 point review of systems is negative.   Physical Exam:  weight is 192 lb (87.1 kg). Her oral temperature is 98.4 F (36.9 C). Her blood pressure is 108/64 and her pulse is 92. Her respiration is 17 and oxygen saturation is 98%.   Wt Readings from Last 3 Encounters:  11/13/21 192 lb (87.1 kg)  10/23/21 191 lb (86.6 kg)  10/08/21 198 lb (89.8 kg)    Ocular: Improvement of pallor, pupils equal, round and reactive to light Ear-nose-throat: Oropharynx clear, dentition fair, gums with moderate pallor Lymphatic: No cervical or supraclavicular  adenopathy Lungs no rales or rhonchi, good excursion bilaterally Heart regular rate and rhythm, no murmur appreciated Abd soft, nontender, positive bowel sounds MSK no focal spinal tenderness, no joint edema Neuro: non-focal, well-oriented, appropriate affect  Lab Results  Component Value Date   WBC 3.5 (L) 11/13/2021   HGB 10.7 (L) 11/13/2021   HCT 35.1 (L) 11/13/2021   MCV 78.0 (L) 11/13/2021   PLT 249 11/13/2021   Lab Results  Component Value Date   FERRITIN 23 08/14/2021   IRON 80 08/14/2021   TIBC 312 08/14/2021   UIBC 232 08/14/2021   IRONPCTSAT 26 08/14/2021   Lab Results  Component Value Date   RETICCTPCT 1.5 11/13/2021   RBC 4.51 11/13/2021   RETICCTABS 138.14 (H) 05/27/2015   No results found for: "KPAFRELGTCHN", "LAMBDASER", "KAPLAMBRATIO" No results found for: "IGGSERUM", "IGA", "IGMSERUM" No results found for: "TOTALPROTELP", "ALBUMINELP", "A1GS", "A2GS", "BETS", "BETA2SER", "GAMS", "MSPIKE", "SPEI"   Chemistry      Component Value Date/Time   NA 139 10/08/2021 0704   NA 141 05/27/2015 1441   K 3.5 10/08/2021 0704   K 4.0 05/27/2015 1441   CL 106 10/08/2021 0704   CO2 26 10/08/2021 0704   CO2 29 05/27/2015 1441   BUN 7 10/08/2021 0704   BUN 9.9 05/27/2015 1441   CREATININE 0.72 10/08/2021 0704   CREATININE 0.77 06/16/2021 1442   CREATININE 0.8 05/27/2015 1441      Component Value Date/Time   CALCIUM 9.4 10/08/2021 0704   CALCIUM 9.8 05/27/2015 1441   ALKPHOS 56 06/16/2021 1442   ALKPHOS 44 05/27/2015 1441   AST 14 (L) 06/16/2021 1442   AST 17 05/27/2015 1441   ALT 9 06/16/2021 1442   ALT 11 05/27/2015 1441   BILITOT 1.0 06/16/2021 1442   BILITOT 0.76 05/27/2015 1441      Encounter Diagnoses  Name Primary?   Iron deficiency anemia due to chronic blood loss Yes   Menorrhagia with regular cycle    Recent major surgery    Neutropenia, unspecified type ([place])     Impression and Plan: [name] is a very pleasant 46 yo [place] American  female with history of iron deficiency anemia secondary to heavy cycles with uterine fibroids.   She is now s/p hysterectomy. Healing well CBC shows improving in hemoglobin level which is fantastic news. I expect this to continue to improve with time.  WBC is low- no recent or recurrent illness. Will monitor Restarting her on iron supplementation (held right after surgery suspected to be due to risk fo constipation however she tolerated this well in the past)- restarting her on Fusion plus Iron studies for today are pending.  Follow-up in  2 months.   Hughie Closs, PA-C 10/12/202312:19 PM

## 2021-11-14 ENCOUNTER — Ambulatory Visit: Payer: BC Managed Care – PPO | Admitting: Family

## 2021-11-14 ENCOUNTER — Other Ambulatory Visit: Payer: BC Managed Care – PPO

## 2021-11-17 ENCOUNTER — Telehealth: Payer: Self-pay | Admitting: *Deleted

## 2021-11-17 NOTE — Telephone Encounter (Signed)
Per scheduling message Judson Roch - Called and lvm with patient and patient's mother for a callback to schedule (3) doses of IV Iron.

## 2021-11-18 ENCOUNTER — Inpatient Hospital Stay: Payer: BC Managed Care – PPO

## 2021-11-18 VITALS — BP 158/83 | HR 67 | Temp 98.3°F | Resp 18

## 2021-11-18 DIAGNOSIS — Z86018 Personal history of other benign neoplasm: Secondary | ICD-10-CM | POA: Diagnosis not present

## 2021-11-18 DIAGNOSIS — D509 Iron deficiency anemia, unspecified: Secondary | ICD-10-CM | POA: Diagnosis not present

## 2021-11-18 DIAGNOSIS — N920 Excessive and frequent menstruation with regular cycle: Secondary | ICD-10-CM

## 2021-11-18 DIAGNOSIS — D259 Leiomyoma of uterus, unspecified: Secondary | ICD-10-CM | POA: Diagnosis not present

## 2021-11-18 DIAGNOSIS — D709 Neutropenia, unspecified: Secondary | ICD-10-CM | POA: Diagnosis not present

## 2021-11-18 DIAGNOSIS — D500 Iron deficiency anemia secondary to blood loss (chronic): Secondary | ICD-10-CM

## 2021-11-18 DIAGNOSIS — Z90710 Acquired absence of both cervix and uterus: Secondary | ICD-10-CM | POA: Diagnosis not present

## 2021-11-18 DIAGNOSIS — Z79899 Other long term (current) drug therapy: Secondary | ICD-10-CM | POA: Diagnosis not present

## 2021-11-18 MED ORDER — SODIUM CHLORIDE 0.9 % IV SOLN: Freq: Once | INTRAVENOUS | Status: AC

## 2021-11-18 MED ORDER — SODIUM CHLORIDE 0.9 % IV SOLN
300.0000 mg | Freq: Once | INTRAVENOUS | Status: AC
  Administered 2021-11-18: 300 mg via INTRAVENOUS
  Filled 2021-11-18: qty 10

## 2021-11-18 NOTE — Progress Notes (Signed)
Patient does not want to stay for the 30 minute post IV iron observation. Patient discharged ambulatory without complaints or concerns.  

## 2021-11-18 NOTE — Patient Instructions (Signed)
Iron Sucrose Injection What is this medication? (Venofer) IRON SUCROSE (EYE ern SOO krose) treats low levels of iron (iron deficiency anemia) in people with kidney disease. Iron is a mineral that plays an important role in making red blood cells, which carry oxygen from your lungs to the rest of your body. This medicine may be used for other purposes; ask your health care provider or pharmacist if you have questions. COMMON BRAND NAME(S): Venofer What should I tell my care team before I take this medication? They need to know if you have any of these conditions: Anemia not caused by low iron levels Heart disease High levels of iron in the blood Kidney disease Liver disease An unusual or allergic reaction to iron, other medications, foods, dyes, or preservatives Pregnant or trying to get pregnant Breast-feeding How should I use this medication? This medication is for infusion into a vein. It is given in a hospital or clinic setting. Talk to your care team about the use of this medication in children. While this medication may be prescribed for children as [place] as 2 years for selected conditions, precautions do apply. Overdosage: If you think you have taken too much of this medicine contact a poison control center or emergency room at once. NOTE: This medicine is only for you. Do not share this medicine with others. What if I miss a dose? It is important not to miss your dose. Call your care team if you are unable to keep an appointment. What may interact with this medication? Do not take this medication with any of the following: Deferoxamine Dimercaprol Other iron products This medication may also interact with the following: Chloramphenicol Deferasirox This list may not describe all possible interactions. Give your health care provider a list of all the medicines, herbs, non-prescription drugs, or dietary supplements you use. Also tell them if you smoke, drink alcohol, or use illegal  drugs. Some items may interact with your medicine. What should I watch for while using this medication? Visit your care team regularly. Tell your care team if your symptoms do not start to get better or if they get worse. You may need blood work done while you are taking this medication. You may need to follow a special diet. Talk to your care team. Foods that contain iron include: whole grains/cereals, dried fruits, beans, or peas, leafy green vegetables, and organ meats (liver, kidney). What side effects may I notice from receiving this medication? Side effects that you should report to your care team as soon as possible: Allergic reactions--skin rash, itching, hives, swelling of the face, lips, tongue, or throat Low blood pressure--dizziness, feeling faint or lightheaded, blurry vision Shortness of breath Side effects that usually do not require medical attention (report to your care team if they continue or are bothersome): Flushing Headache Joint pain Muscle pain Nausea Pain, redness, or irritation at injection site This list may not describe all possible side effects. Call your doctor for medical advice about side effects. You may report side effects to FDA at 1-800-FDA-1088. Where should I keep my medication? This medication is given in a hospital or clinic and will not be stored at home. NOTE: This sheet is a summary. It may not cover all possible information. If you have questions about this medicine, talk to your doctor, pharmacist, or health care provider.  2023 Elsevier/Gold Standard (2007-03-12 00:00:00)

## 2021-11-20 ENCOUNTER — Other Ambulatory Visit: Payer: Self-pay | Admitting: Obstetrics and Gynecology

## 2021-11-25 ENCOUNTER — Inpatient Hospital Stay: Payer: BC Managed Care – PPO

## 2021-11-26 ENCOUNTER — Inpatient Hospital Stay: Payer: BC Managed Care – PPO

## 2021-11-26 VITALS — BP 129/61 | HR 65 | Temp 99.0°F | Resp 17

## 2021-11-26 DIAGNOSIS — Z79899 Other long term (current) drug therapy: Secondary | ICD-10-CM | POA: Diagnosis not present

## 2021-11-26 DIAGNOSIS — Z86018 Personal history of other benign neoplasm: Secondary | ICD-10-CM | POA: Diagnosis not present

## 2021-11-26 DIAGNOSIS — D259 Leiomyoma of uterus, unspecified: Secondary | ICD-10-CM | POA: Diagnosis not present

## 2021-11-26 DIAGNOSIS — D500 Iron deficiency anemia secondary to blood loss (chronic): Secondary | ICD-10-CM | POA: Diagnosis not present

## 2021-11-26 DIAGNOSIS — N920 Excessive and frequent menstruation with regular cycle: Secondary | ICD-10-CM | POA: Diagnosis not present

## 2021-11-26 DIAGNOSIS — D509 Iron deficiency anemia, unspecified: Secondary | ICD-10-CM | POA: Diagnosis not present

## 2021-11-26 DIAGNOSIS — Z90710 Acquired absence of both cervix and uterus: Secondary | ICD-10-CM | POA: Diagnosis not present

## 2021-11-26 DIAGNOSIS — D709 Neutropenia, unspecified: Secondary | ICD-10-CM | POA: Diagnosis not present

## 2021-11-26 MED ORDER — SODIUM CHLORIDE 0.9 % IV SOLN
300.0000 mg | Freq: Once | INTRAVENOUS | Status: AC
  Administered 2021-11-26: 300 mg via INTRAVENOUS
  Filled 2021-11-26: qty 300

## 2021-11-26 MED ORDER — SODIUM CHLORIDE 0.9 % IV SOLN: Freq: Once | INTRAVENOUS | Status: AC

## 2021-11-26 NOTE — Patient Instructions (Signed)

## 2021-12-02 ENCOUNTER — Inpatient Hospital Stay: Payer: BC Managed Care – PPO

## 2021-12-02 VITALS — BP 127/88 | HR 66 | Temp 98.5°F | Resp 18 | Ht 62.0 in

## 2021-12-02 DIAGNOSIS — D500 Iron deficiency anemia secondary to blood loss (chronic): Secondary | ICD-10-CM | POA: Diagnosis not present

## 2021-12-02 DIAGNOSIS — D259 Leiomyoma of uterus, unspecified: Secondary | ICD-10-CM | POA: Diagnosis not present

## 2021-12-02 DIAGNOSIS — Z79899 Other long term (current) drug therapy: Secondary | ICD-10-CM | POA: Diagnosis not present

## 2021-12-02 DIAGNOSIS — N920 Excessive and frequent menstruation with regular cycle: Secondary | ICD-10-CM | POA: Diagnosis not present

## 2021-12-02 DIAGNOSIS — Z90710 Acquired absence of both cervix and uterus: Secondary | ICD-10-CM | POA: Diagnosis not present

## 2021-12-02 DIAGNOSIS — D709 Neutropenia, unspecified: Secondary | ICD-10-CM | POA: Diagnosis not present

## 2021-12-02 DIAGNOSIS — Z86018 Personal history of other benign neoplasm: Secondary | ICD-10-CM | POA: Diagnosis not present

## 2021-12-02 DIAGNOSIS — D509 Iron deficiency anemia, unspecified: Secondary | ICD-10-CM | POA: Diagnosis not present

## 2021-12-02 MED ORDER — SODIUM CHLORIDE 0.9 % IV SOLN
300.0000 mg | Freq: Once | INTRAVENOUS | Status: AC
  Administered 2021-12-02: 300 mg via INTRAVENOUS
  Filled 2021-12-02: qty 300

## 2021-12-02 MED ORDER — SODIUM CHLORIDE 0.9 % IV SOLN: Freq: Once | INTRAVENOUS | Status: AC

## 2021-12-02 NOTE — Patient Instructions (Signed)

## 2021-12-03 NOTE — Progress Notes (Unsigned)
   GYNECOLOGY OFFICE VISIT NOTE  History:  [name] is a 46 y.o. [phone] here today for postop check. She had a TAH, bilateral salpinectomy on 10/08/21. Her surgery was without complication.  She had a blood transfusion due to chronic blood loss prior to surgery but also acute blood loss as expected with the surgery.   Last CBC check was on 10/12 and was 10.7, she got Iron IV weekly since 10/17.    She is recovering well. She has minimal pain.      Past Medical History:  Diagnosis Date   Anemia    Asthma    Blood transfusion without reported diagnosis    Gestational diabetes    Uterine fibroid     Past Surgical History:  Procedure Laterality Date   arm surgery     CERVICAL CERCLAGE     CESAREAN SECTION     DILATION AND CURETTAGE OF UTERUS     HYSTERECTOMY ABDOMINAL WITH SALPINGECTOMY Bilateral 10/08/2021   Procedure: ABDOMINAL HYSTERECTOMY WITH BILATERAL  SALPINGECTOMY;  Surgeon: Radene Gunning, MD;  Location: [facility];  Service: Gynecology;  Laterality: Bilateral;   IR RADIOLOGIST EVAL & MGMT  09/04/2021   TOOTH EXTRACTION      The following portions of the patient's history were reviewed and updated as appropriate: allergies, current medications, past family history, past medical history, past social history, past surgical history and problem list.   Health Maintenance:   Normal mammogram 2016  Review of Systems:  Pertinent items noted in HPI and remainder of comprehensive ROS otherwise negative.  Physical Exam:  LMP 08/13/2021 (Approximate)  CONSTITUTIONAL: Well-developed, well-nourished female in no acute distress.  HEENT:  Normocephalic, atraumatic. External right and left ear normal. No scleral icterus.  NECK: Normal range of motion, supple, no masses noted on observation SKIN: No rash noted. Not diaphoretic. No erythema. No pallor. MUSCULOSKELETAL: Normal range of motion. No edema noted. NEUROLOGIC: Alert and oriented to person, place, and time. Normal muscle tone  coordination. No cranial nerve deficit noted. PSYCHIATRIC: Normal mood and affect. Normal behavior. Normal judgment and thought content.  CARDIOVASCULAR: Normal heart rate noted RESPIRATORY: Effort and breath sounds normal, no problems with respiration noted ABDOMEN: No masses noted. No other overt distention noted.  Incision c/d/I***  PELVIC:  cuff healing well   Labs and Imaging No results found for this or any previous visit (from the past 168 hour(s)). No results found.  Assessment and Plan:   1. Postop check ***   *** Schedule for mammogram  Routine preventative health maintenance measures emphasized. Please refer to After Visit Summary for other counseling recommendations.   No follow-ups on file.  Radene Gunning, MD, [place] for [facility], [place]

## 2021-12-04 ENCOUNTER — Encounter: Payer: Self-pay | Admitting: Obstetrics and Gynecology

## 2021-12-04 ENCOUNTER — Ambulatory Visit (INDEPENDENT_AMBULATORY_CARE_PROVIDER_SITE_OTHER): Payer: Medicaid Other | Admitting: Obstetrics and Gynecology

## 2021-12-04 VITALS — BP 119/83 | HR 98 | Ht 64.0 in | Wt 179.0 lb

## 2021-12-04 DIAGNOSIS — Z09 Encounter for follow-up examination after completed treatment for conditions other than malignant neoplasm: Secondary | ICD-10-CM

## 2021-12-04 NOTE — Patient Instructions (Addendum)
     Vitamin C in the lotion for face - helps with skin turnover and it is gentle.

## 2021-12-05 ENCOUNTER — Telehealth: Payer: Self-pay

## 2021-12-05 ENCOUNTER — Encounter: Payer: Self-pay | Admitting: Family Medicine

## 2021-12-05 ENCOUNTER — Ambulatory Visit (INDEPENDENT_AMBULATORY_CARE_PROVIDER_SITE_OTHER): Payer: BC Managed Care – PPO | Admitting: Family Medicine

## 2021-12-05 ENCOUNTER — Emergency Department (HOSPITAL_BASED_OUTPATIENT_CLINIC_OR_DEPARTMENT_OTHER)
Admission: EM | Admit: 2021-12-05 | Discharge: 2021-12-05 | Disposition: A | Discharge status: Home / Self Care | Payer: BC Managed Care – PPO | Attending: Emergency Medicine | Admitting: Emergency Medicine

## 2021-12-05 ENCOUNTER — Encounter: Payer: Self-pay | Admitting: Family

## 2021-12-05 ENCOUNTER — Encounter (HOSPITAL_BASED_OUTPATIENT_CLINIC_OR_DEPARTMENT_OTHER): Payer: Self-pay

## 2021-12-05 ENCOUNTER — Other Ambulatory Visit: Payer: Self-pay

## 2021-12-05 VITALS — BP 109/78 | HR 94 | Temp 97.5°F | Ht 64.0 in | Wt 180.0 lb

## 2021-12-05 DIAGNOSIS — E1165 Type 2 diabetes mellitus with hyperglycemia: Secondary | ICD-10-CM | POA: Diagnosis not present

## 2021-12-05 DIAGNOSIS — Z1159 Encounter for screening for other viral diseases: Secondary | ICD-10-CM

## 2021-12-05 DIAGNOSIS — H9203 Otalgia, bilateral: Secondary | ICD-10-CM

## 2021-12-05 DIAGNOSIS — R350 Frequency of micturition: Secondary | ICD-10-CM | POA: Diagnosis not present

## 2021-12-05 DIAGNOSIS — Z91040 Latex allergy status: Secondary | ICD-10-CM | POA: Diagnosis not present

## 2021-12-05 DIAGNOSIS — L708 Other acne: Secondary | ICD-10-CM

## 2021-12-05 DIAGNOSIS — H538 Other visual disturbances: Secondary | ICD-10-CM

## 2021-12-05 DIAGNOSIS — R739 Hyperglycemia, unspecified: Secondary | ICD-10-CM | POA: Insufficient documentation

## 2021-12-05 DIAGNOSIS — F418 Other specified anxiety disorders: Secondary | ICD-10-CM

## 2021-12-05 LAB — I-STAT VENOUS BLOOD GAS, ED
Acid-Base Excess: 0 mmol/L (ref 0.0–2.0)
Bicarbonate: 25 mmol/L (ref 20.0–28.0)
Calcium, Ion: 1.28 mmol/L (ref 1.15–1.40)
HCT: 46 % (ref 36.0–46.0)
Hemoglobin: 15.6 g/dL — ABNORMAL HIGH (ref 12.0–15.0)
O2 Saturation: 87 %
Potassium: 4.3 mmol/L (ref 3.5–5.1)
Sodium: 131 mmol/L — ABNORMAL LOW (ref 135–145)
TCO2: 26 mmol/L (ref 22–32)
pCO2, Ven: 40.1 mmHg — ABNORMAL LOW (ref 44–60)
pH, Ven: 7.403 (ref 7.25–7.43)
pO2, Ven: 52 mmHg — ABNORMAL HIGH (ref 32–45)

## 2021-12-05 LAB — BASIC METABOLIC PANEL
Anion gap: 14 (ref 5–15)
BUN: 14 mg/dL (ref 6–23)
BUN: 15 mg/dL (ref 6–20)
CO2: 26 mEq/L (ref 19–32)
CO2: 23 mmol/L (ref 22–32)
Calcium: 10.8 mg/dL — ABNORMAL HIGH (ref 8.4–10.5)
Calcium: 10.8 mg/dL — ABNORMAL HIGH (ref 8.9–10.3)
Chloride: 91 mEq/L — ABNORMAL LOW (ref 96–112)
Chloride: 91 mmol/L — ABNORMAL LOW (ref 98–111)
Creatinine, Ser: 0.91 mg/dL (ref 0.40–1.20)
Creatinine, Ser: 0.97 mg/dL (ref 0.44–1.00)
GFR, Estimated: >60 mL/min (ref >60)
GFR: 74.8 mL/min (ref >60.00)
Glucose, Bld: 782 mg/dL (ref 70–99)
Glucose, Bld: 627 mg/dL (ref 70–99)
Potassium: 4.7 mEq/L (ref 3.5–5.1)
Potassium: 4.6 mmol/L (ref 3.5–5.1)
Sodium: 129 mEq/L — ABNORMAL LOW (ref 135–145)
Sodium: 128 mmol/L — ABNORMAL LOW (ref 135–145)

## 2021-12-05 LAB — URINALYSIS, ROUTINE W REFLEX MICROSCOPIC
Bilirubin Urine: NEGATIVE
Glucose, UA: >1000 mg/dL — AB
Hgb urine dipstick: NEGATIVE
Ketones, ur: 15 mg/dL — AB
Leukocytes,Ua: NEGATIVE
Nitrite: NEGATIVE
Protein, ur: NEGATIVE mg/dL
Specific Gravity, Urine: 1.033 — ABNORMAL HIGH (ref 1.005–1.030)
pH: 5 (ref 5.0–8.0)

## 2021-12-05 LAB — CBC
HCT: 45.1 % (ref 36.0–46.0)
Hemoglobin: 14.5 g/dL (ref 12.0–15.0)
MCH: 24.7 pg — ABNORMAL LOW (ref 26.0–34.0)
MCHC: 32.2 g/dL (ref 30.0–36.0)
MCV: 77 fL — ABNORMAL LOW (ref 80.0–100.0)
Platelets: 346 10*3/uL (ref 150–400)
RBC: 5.86 MIL/uL — ABNORMAL HIGH (ref 3.87–5.11)
RDW: 19.1 % — ABNORMAL HIGH (ref 11.5–15.5)
WBC: 4.3 10*3/uL (ref 4.0–10.5)
nRBC: 0 % (ref 0.0–0.2)

## 2021-12-05 LAB — POCT URINALYSIS DIPSTICK
Bilirubin, UA: NEGATIVE
Blood, UA: NEGATIVE
Glucose, UA: POSITIVE — AB
Ketones, UA: POSITIVE
Leukocytes, UA: NEGATIVE
Nitrite, UA: NEGATIVE
Protein, UA: NEGATIVE
Spec Grav, UA: 1.01 (ref 1.010–1.025)
Urobilinogen, UA: 0.2 E.U./dL
pH, UA: 5.5 (ref 5.0–8.0)

## 2021-12-05 LAB — TSH: TSH: 1.61 u[IU]/mL (ref 0.35–5.50)

## 2021-12-05 LAB — HEMOGLOBIN A1C: Hgb A1c MFr Bld: 10.3 % — ABNORMAL HIGH (ref 4.6–6.5)

## 2021-12-05 LAB — CBG MONITORING, ED
Glucose-Capillary: 581 mg/dL (ref 70–99)
Glucose-Capillary: 360 mg/dL — ABNORMAL HIGH (ref 70–99)
Glucose-Capillary: 238 mg/dL — ABNORMAL HIGH (ref 70–99)

## 2021-12-05 LAB — POCT GLYCOSYLATED HEMOGLOBIN (HGB A1C): Hemoglobin A1C: 11 % — AB (ref 4.0–5.6)

## 2021-12-05 LAB — PREGNANCY, URINE: Preg Test, Ur: NEGATIVE

## 2021-12-05 MED ORDER — SODIUM CHLORIDE 0.9 % IV BOLUS
1000.0000 mL | Freq: Once | INTRAVENOUS | Status: AC
  Administered 2021-12-05: 1000 mL via INTRAVENOUS

## 2021-12-05 MED ORDER — BLOOD GLUCOSE MONITOR KIT: PACK | 0 refills | Status: DC

## 2021-12-05 MED ORDER — CLINDAMYCIN PHOSPHATE 1 % EX GEL: Freq: Two times a day (BID) | CUTANEOUS | 1 refills | Status: DC

## 2021-12-05 MED ORDER — INSULIN REGULAR HUMAN 100 UNIT/ML IJ SOLN: 10.0000 [IU] | Freq: Once | INTRAMUSCULAR | Status: DC

## 2021-12-05 MED ORDER — METFORMIN HCL 500 MG PO TABS: 500.0000 mg | ORAL_TABLET | Freq: Two times a day (BID) | ORAL | 0 refills | Status: DC

## 2021-12-05 MED ORDER — INSULIN ASPART 100 UNIT/ML IJ SOLN
10.0000 [IU] | Freq: Once | INTRAMUSCULAR | Status: AC
  Administered 2021-12-05: 10 [IU] via SUBCUTANEOUS

## 2021-12-05 NOTE — Telephone Encounter (Signed)
Glucose 782

## 2021-12-05 NOTE — ED Triage Notes (Signed)
Patient here POV from [place], Polydipsia, Polyuria for approximately 1 Week.   Went to PCP and CBG was found to be high. Sent for Assessment. No history of Diabetes prior to besides Gestational.   NAD Noted during Triage. A&Ox4. GCS 15. Ambulatory.

## 2021-12-05 NOTE — ED Provider Notes (Signed)
[facility] EMERGENC[facility] DEPT Provider Note   CSN: [id number] Arrival date & time: 12/05/21  1408     History {Add pertinent medical, surgical, social history, OB history to HPI:1} Chief Complaint  Patient presents with  . Hyperglycemia    [name] is a 46 y.o. female.  Sent here from her primary care physician's office for glucose reading high x2.  Patient has no history of diabetes.  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  Patient had a hysterectomy in September.  Had normal glucose readings at that time.  Patient denies any recent illness.  Patient reports she has been drinking sweet tea and a significant amount of soda.  Patient's daughter who is with her confirms that she drinks a large amount of soda.  Patient denies any fever or chills she denies any cough or congestion   Hyperglycemia Pt reports she has had some blurred vision      Home Medications Prior to Admission medications   Medication Sig Start Date End Date Taking? Authorizing Provider  blood glucose meter kit and supplies KIT Dispense based on patient and insurance preference. Use up to four times daily as directed. 12/05/21  Yes Caryl Ada K, PA-C  albuterol (PROVENTIL HFA;VENTOLIN HFA) 108 (90 Base) MCG/ACT inhaler Inhale 2 puffs into the lungs as needed for wheezing or shortness of breath.    [provider]  Calcium Carb-Cholecalciferol (CALCIUM + D3 PO) Take 1 tablet by mouth daily. When able to remember Patient not taking: Reported on 10/23/2021    [provider]  cetirizine (ZYRTEC ALLERGY) 10 MG tablet Take 1 tablet (10 mg total) by mouth daily. Patient not taking: Reported on 10/23/2021 07/04/21   Jeanie Sewer, NP  clindamycin (CLINDAGEL) 1 % gel Apply topically 2 (two) times daily. 12/05/21   Tawnya Crook, MD  Cyanocobalamin (B-12 PO) Take 1 capsule by mouth daily. When able to remember Patient not taking: Reported on 10/23/2021    [provider]  fluticasone (FLONASE) 50 MCG/ACT nasal spray Place 1 spray into both nostrils in the morning and at bedtime. 07/04/21   Jeanie Sewer, NP  ibuprofen (ADVIL) 600 MG tablet Take 1 tablet (600 mg total) by mouth every 6 (six) hours as needed for fever or headache. Patient not taking: Reported on 10/23/2021 10/10/21   Radene Gunning, MD  Iron-FA-B Cmp-C-Biot-Probiotic (FUSION PLUS) CAPS Take 1 tablet by mouth in the morning. 11/13/21   Hughie Closs, PA-C  metFORMIN (GLUCOPHAGE) 500 MG tablet Take 1 tablet (500 mg total) by mouth 2 (two) times daily with a meal. 12/05/21   Tawnya Crook, MD  ondansetron (ZOFRAN-ODT) 4 MG disintegrating tablet Take 1 tablet (4 mg total) by mouth every 6 (six) hours as needed for nausea. 10/10/21   Radene Gunning, MD      Allergies    Shellfish allergy, Other, and Latex    Review of Systems   Review of Systems  All other systems reviewed and are negative.   Physical Exam Updated Vital Signs BP 110/78 (BP Location: Left Arm)   Pulse 81   Temp 98.4 F (36.9 C) (Oral)   Resp 18   Ht _0  (1.626 m)   Wt 81.6 kg   LMP 08/13/2021 (Approximate)   SpO2 98%   BMI 30.88 kg/m  Physical Exam Vitals and nursing note reviewed.  Constitutional:      Appearance: She is well-developed.  HENT:     Head: Normocephalic.  Right Ear: External ear normal.     Left Ear: External ear normal.     Nose: Nose normal.     Mouth/Throat:     Mouth: Mucous membranes are moist.  Eyes:     Pupils: Pupils are equal, round, and reactive to light.  Cardiovascular:     Rate and Rhythm: Normal rate.  Pulmonary:     Effort: Pulmonary effort is normal.  Abdominal:     General: There is no distension.  Musculoskeletal:        General: Normal range of motion.     Cervical back: Normal range of motion.  Skin:    General: Skin is warm.  Neurological:     General: No focal deficit present.     Mental Status: She is alert and oriented to person, place, and  time.    ED Results / Procedures / Treatments   Labs (all labs ordered are listed, but only abnormal results are displayed) Labs Reviewed  BASIC METABOLIC PANEL - Abnormal; Notable for the following components:      Result Value   Sodium 128 (*)    Chloride 91 (*)    Glucose, Bld 627 (*)    Calcium 10.8 (*)    All other components within normal limits  CBC - Abnormal; Notable for the following components:   RBC 5.86 (*)    MCV 77.0 (*)    MCH 24.7 (*)    RDW 19.1 (*)    All other components within normal limits  URINALYSIS, ROUTINE W REFLEX MICROSCOPIC - Abnormal; Notable for the following components:   Color, Urine COLORLESS (*)    Specific Gravity, Urine 1.033 (*)    Glucose, UA >1,000 (*)    Ketones, ur 15 (*)    Bacteria, UA RARE (*)    All other components within normal limits  CBG MONITORING, ED - Abnormal; Notable for the following components:   Glucose-Capillary 581 (*)    All other components within normal limits  I-STAT VENOUS BLOOD GAS, ED - Abnormal; Notable for the following components:   pCO2, Ven 40.1 (*)    pO2, Ven 52 (*)    Sodium 131 (*)    Hemoglobin 15.6 (*)    All other components within normal limits  CBG MONITORING, ED - Abnormal; Notable for the following components:   Glucose-Capillary 360 (*)    All other components within normal limits  CBG MONITORING, ED - Abnormal; Notable for the following components:   Glucose-Capillary 238 (*)    All other components within normal limits  PREGNANCY, URINE    EKG None  Radiology No results found.  Procedures Procedures  {Document cardiac monitor, telemetry assessment procedure when appropriate:1}  Medications Ordered in ED Medications  sodium chloride 0.9 % bolus 1,000 mL (0 mLs Intravenous Stopped 12/05/21 1609)  insulin aspart (novoLOG) injection 10 Units (10 Units Subcutaneous Given 12/05/21 1551)  sodium chloride 0.9 % bolus 1,000 mL (1,000 mLs Intravenous New Bag/Given 12/05/21 1611)     ED Course/ Medical Decision Making/ A&P                           Medical Decision Making Patient reports she was sent here by her primary care provider after her glucose read high x2 on their glucometer.  Patient has no history of diabetes.  Patient reports she has been having some blurred vision.  Amount and/or Complexity of Data Reviewed Independent Historian:  Details: Patient is here with her 2 daughters.  They are supportive External Data Reviewed: notes.    Details: Patient's A1c was 11 Labs: ordered. Decision-making details documented in ED Course.    Details: Labs ordered reviewed and interpreted CBG was 581 at triage.  CBC bMAT UA and VBG were obtained. Patient is UA showed 15 ketones.  Basic metabolic panel glucose is 637. Discussion of management or test interpretation with external provider(s): Patient given IV fluids x2 L insulin 10 units subq.  Patient observed extended period of time.  Repeat glucose is 238.  Patient counseled at length on diabetes management she is given a prescription for a glucose monitor strips and lancets.  Patient is advised she needs to start Glucophage as directed by her primary care physician.  Patient is advised to avoid sweetened juices and sodas.  Patient given information on dietary restrictions  Risk OTC drugs. Prescription drug management.  Be met glucose is 627   {Document critical care time when appropriate:1} {Document review of labs and clinical decision tools ie heart score, Chads2Vasc2 etc:1}  {Document your independent review of radiology images, and any outside records:1} {Document your discussion with family members, caretakers, and with consultants:1} {Document social determinants of health affecting pt's care:1} {Document your decision making why or why not admission, treatments were needed:1} Final Clinical Impression(s) / ED Diagnoses Final diagnoses:  None    Rx / DC Orders ED Discharge Orders          Ordered     blood glucose meter kit and supplies KIT        12/05/21 1842

## 2021-12-05 NOTE — Discharge Instructions (Signed)
Take metformin as prescribed by your Physician.

## 2021-12-05 NOTE — Progress Notes (Addendum)
Subjective:     Patient ID: [name], female    DOB: 1975-03-01, 46 y.o.   MRN: [id number]  Chief Complaint  Patient presents with   Follow-up    3 month follow-up Not fasting Check ears for possible ear infection, having some blurry vision Face feels itchy      HPI-here w/daughter  Mixed anx/depression-pt was on lexapro '20mg'$ -hosp had said to stop taking so off.  No SI.  A lot going on.  Pt wants to hold on meds to let body adjust to hyst. Ears-wants checked.  Some days, ears were hurting and face intermitt since cold weather. Occ runny nose.  Out of zyrtec.  Occ watery eyes.  Can get itchy. Occ using OTC "nasal sprays".   Face itching-more sides of face. Breaking out some.  Itchy/dry.  Some bumps.  Using mask Hyst-saw gyn yest.  Inc urination.  No dysuria, just has to go quickly after drinking.   Health Maintenance Due  Topic Date Due   Hepatitis C Screening  Never done   COLONOSCOPY (Pts 45-27yr Insurance coverage will need to be confirmed)  Never done    Past Medical History:  Diagnosis Date   Anemia    Asthma    Blood transfusion without reported diagnosis    Gestational diabetes    Uterine fibroid     Past Surgical History:  Procedure Laterality Date   arm surgery     CERVICAL CERCLAGE     CESAREAN SECTION     DILATION AND CURETTAGE OF UTERUS     HYSTERECTOMY ABDOMINAL WITH SALPINGECTOMY Bilateral 10/08/2021   Procedure: ABDOMINAL HYSTERECTOMY WITH BILATERAL  SALPINGECTOMY;  Surgeon: DRadene Gunning MD;  Location: [facility]  Service: Gynecology;  Laterality: Bilateral;   IR RADIOLOGIST EVAL & MGMT  09/04/2021   TOOTH EXTRACTION      Outpatient Medications Prior to Visit  Medication Sig Dispense Refill   albuterol (PROVENTIL HFA;VENTOLIN HFA) 108 (90 Base) MCG/ACT inhaler Inhale 2 puffs into the lungs as needed for wheezing or shortness of breath.     fluticasone (FLONASE) 50 MCG/ACT nasal spray Place 1 spray into both nostrils in the morning and at bedtime.  16 g 2   Iron-FA-B Cmp-C-Biot-Probiotic (FUSION PLUS) CAPS Take 1 tablet by mouth in the morning. 30 capsule 2   ondansetron (ZOFRAN-ODT) 4 MG disintegrating tablet Take 1 tablet (4 mg total) by mouth every 6 (six) hours as needed for nausea. 20 tablet 0   Calcium Carb-Cholecalciferol (CALCIUM + D3 PO) Take 1 tablet by mouth daily. When able to remember (Patient not taking: Reported on 10/23/2021)     cetirizine (ZYRTEC ALLERGY) 10 MG tablet Take 1 tablet (10 mg total) by mouth daily. (Patient not taking: Reported on 10/23/2021) 30 tablet 5   Cyanocobalamin (B-12 PO) Take 1 capsule by mouth daily. When able to remember (Patient not taking: Reported on 10/23/2021)     ibuprofen (ADVIL) 600 MG tablet Take 1 tablet (600 mg total) by mouth every 6 (six) hours as needed for fever or headache. (Patient not taking: Reported on 10/23/2021) 30 tablet 0   acetaminophen (TYLENOL) 500 MG tablet Take 2 tablets (1,000 mg total) by mouth every 6 (six) hours. (Patient not taking: Reported on 10/23/2021) 30 tablet 0   escitalopram (LEXAPRO) 20 MG tablet Take 1 tablet (20 mg total) by mouth daily. (Patient not taking: Reported on 10/23/2021) 90 tablet 1   oxyCODONE (OXY IR/ROXICODONE) 5 MG immediate release tablet Take 1-2 tablets (5-10  mg total) by mouth every 4 (four) hours as needed for severe pain. (Patient not taking: Reported on 10/23/2021) 20 tablet 0   No facility-administered medications prior to visit.    Allergies  Allergen Reactions   Shellfish Allergy Itching and Swelling    Throat swells    Other Itching and Swelling    Hazelnut. Swelling of throat    Latex Itching, Rash and Other (See Comments)    Burning    ROS neg/noncontributory except as noted HPI/below  Vision blurry reading.       Objective:     BP 109/78   Pulse 94   Temp (!) 97.5 F (36.4 C) (Temporal)   Ht '5\' 4"'$  (1.626 m)   Wt 180 lb (81.6 kg)   LMP 08/13/2021 (Approximate)   SpO2 96%   BMI 30.90 kg/m  Wt Readings from Last 3  Encounters:  12/05/21 180 lb (81.6 kg)  12/04/21 179 lb (81.2 kg)  11/13/21 192 lb (87.1 kg)    Physical Exam   Gen: WDWN NAD HEENT: NCAT, conjunctiva not injected, sclera nonicteric TM WNL B, OP moist, no exudates  NECK:  supple, no thyromegaly, no nodes, no carotid bruits CARDIAC: RRR, S1S2+, no murmur.  EXT:  no edema MSK: no gross abnormalities.  NEURO: A&O x3.  CN II-XII intact.  PSYCH: normal mood. Good eye contact Skin: some white heads and acne sides of face  Results for orders placed or performed in visit on 12/05/21  POCT urinalysis dipstick  Result Value Ref Range   Color, UA YELLOW    Clarity, UA CLEAR    Glucose, UA Positive (A) Negative   Bilirubin, UA NEG    Ketones, UA POS    Spec Grav, UA 1.010 1.010 - 1.025   Blood, UA NEG    pH, UA 5.5 5.0 - 8.0   Protein, UA Negative Negative   Urobilinogen, UA 0.2 0.2 or 1.0 E.U./dL   Nitrite, UA NEG    Leukocytes, UA Negative Negative   Appearance     Odor    POCT glycosylated hemoglobin (Hb A1C)  Result Value Ref Range   Hemoglobin A1C 11.0 (A) 4.0 - 5.6 %    A1C was 11.    Assessment & Plan:   Problem List Items Addressed This Visit       Other   Mixed anxiety and depressive disorder   Other Visit Diagnoses     Blurry vision, bilateral    -  Primary   Relevant Orders   Basic metabolic panel   Hemoglobin A1c   TSH   POCT glycosylated hemoglobin (Hb A1C) (Completed)   Other acne       Relevant Medications   clindamycin (CLINDAGEL) 1 % gel   Encounter for hepatitis C screening test for low risk patient       Relevant Orders   Hepatitis C antibody   Frequent urination       Relevant Orders   POCT urinalysis dipstick (Completed)   Otalgia of both ears       Type 2 diabetes mellitus with hyperglycemia, without long-term current use of insulin (HCC)       Relevant Medications   metFORMIN (GLUCOPHAGE) 500 MG tablet      Blurry vision-?age, ?DM-check tsh,bmp, A1C. Acne-clindagel 1% Freq  urination-check for DM.  Check UA-gyn thinks s/p hyst and spasms. Ear pain-exam normal.  May be some allergy.  Use flonase daily 5.  Adjustment disorder-pt off meds.  S/p hyst.  She would like to give body time to recover to see what is left.  6.  New DM-since UA had glucose, the A1C was checked and elevated.  Not sure how accurate as pt has had blood xfusion 2 mo ago.   Await rest of labs.  Start metformin '500mg'$  bid.     Pt came back to office at 145pm-sugar "hi" twice on glucometer.  Given that, new dx, ketones in urine-referred to ER.  Dau works here and will arrange.   Meds ordered this encounter  Medications   clindamycin (CLINDAGEL) 1 % gel    Sig: Apply topically 2 (two) times daily.    Dispense:  30 g    Refill:  1   metFORMIN (GLUCOPHAGE) 500 MG tablet    Sig: Take 1 tablet (500 mg total) by mouth 2 (two) times daily with a meal.    Dispense:  180 tablet    Refill:  0    Wellington Hampshire, MD

## 2021-12-05 NOTE — ED Notes (Signed)
CRITICAL VALUE STICKER  CRITICAL VALUE:[id number]  RECEIVER (on-site recipient of call):I. Levy Wellman  DATE & TIME NOTIFIED: 12/04/21 1512  MESSENGER (representative from lab):  MD NOTIFIED: [place]  TIME OF NOTIFICATION:1513  RESPONSE:

## 2021-12-05 NOTE — Patient Instructions (Signed)
It was very nice to see you today!  Do flonase daily-for ears/nose/eyes.  Moisturizer for face.    PLEASE NOTE:  If you had any lab tests please let us know if you have not heard back within a few days. You may see your results on MyChart before we have a chance to review them but we will give you a call once they are reviewed by [place]. If we ordered any referrals today, please let us know if you have not heard from their office within the next week.   Please try these tips to maintain a healthy lifestyle:  Eat most of your calories during the day when you are active. Eliminate processed foods including packaged sweets (pies, cakes, cookies), reduce intake of potatoes, white bread, white pasta, and white rice. Look for whole grain options, oat flour or almond flour.  Each meal should contain half fruits/vegetables, one quarter protein, and one quarter carbs (no bigger than a computer mouse).  Cut down on sweet beverages. This includes juice, soda, and sweet tea. Also watch fruit intake, though this is a healthier sweet option, it still contains natural sugar! Limit to 3 servings daily.  Drink at least 1 glass of water with each meal and aim for at least 8 glasses per day  Exercise at least 150 minutes every week.

## 2021-12-06 ENCOUNTER — Emergency Department (HOSPITAL_BASED_OUTPATIENT_CLINIC_OR_DEPARTMENT_OTHER)
Admission: EM | Admit: 2021-12-06 | Discharge: 2021-12-06 | Disposition: A | Discharge status: Home / Self Care | Payer: BC Managed Care – PPO | Attending: Emergency Medicine | Admitting: Emergency Medicine

## 2021-12-06 ENCOUNTER — Other Ambulatory Visit: Payer: Self-pay

## 2021-12-06 DIAGNOSIS — E1165 Type 2 diabetes mellitus with hyperglycemia: Secondary | ICD-10-CM | POA: Insufficient documentation

## 2021-12-06 DIAGNOSIS — Z91040 Latex allergy status: Secondary | ICD-10-CM | POA: Insufficient documentation

## 2021-12-06 DIAGNOSIS — Z7984 Long term (current) use of oral hypoglycemic drugs: Secondary | ICD-10-CM | POA: Diagnosis not present

## 2021-12-06 LAB — CBC WITH DIFFERENTIAL/PLATELET
Abs Immature Granulocytes: 0.01 10*3/uL (ref 0.00–0.07)
Basophils Absolute: 0.1 10*3/uL (ref 0.0–0.1)
Basophils Relative: 1 %
Eosinophils Absolute: 0.1 10*3/uL (ref 0.0–0.5)
Eosinophils Relative: 3 %
HCT: 42.1 % (ref 36.0–46.0)
Hemoglobin: 13.1 g/dL (ref 12.0–15.0)
Immature Granulocytes: 0 %
Lymphocytes Relative: 48 %
Lymphs Abs: 2 10*3/uL (ref 0.7–4.0)
MCH: 25.1 pg — ABNORMAL LOW (ref 26.0–34.0)
MCHC: 31.1 g/dL (ref 30.0–36.0)
MCV: 80.7 fL (ref 80.0–100.0)
Monocytes Absolute: 0.3 10*3/uL (ref 0.1–1.0)
Monocytes Relative: 7 %
Neutro Abs: 1.7 10*3/uL (ref 1.7–7.7)
Neutrophils Relative %: 41 %
Platelets: 291 10*3/uL (ref 150–400)
RBC: 5.22 MIL/uL — ABNORMAL HIGH (ref 3.87–5.11)
RDW: 19.4 % — ABNORMAL HIGH (ref 11.5–15.5)
WBC: 4.1 10*3/uL (ref 4.0–10.5)
nRBC: 0 % (ref 0.0–0.2)

## 2021-12-06 LAB — BASIC METABOLIC PANEL
Anion gap: 12 (ref 5–15)
BUN: 15 mg/dL (ref 6–20)
CO2: 22 mmol/L (ref 22–32)
Calcium: 9.2 mg/dL (ref 8.9–10.3)
Chloride: 97 mmol/L — ABNORMAL LOW (ref 98–111)
Creatinine, Ser: 0.91 mg/dL (ref 0.44–1.00)
GFR, Estimated: >60 mL/min (ref >60)
Glucose, Bld: 514 mg/dL (ref 70–99)
Potassium: 4.4 mmol/L (ref 3.5–5.1)
Sodium: 131 mmol/L — ABNORMAL LOW (ref 135–145)

## 2021-12-06 LAB — CBG MONITORING, ED
Glucose-Capillary: 471 mg/dL — ABNORMAL HIGH (ref 70–99)
Glucose-Capillary: 250 mg/dL — ABNORMAL HIGH (ref 70–99)

## 2021-12-06 MED ORDER — GLUCOSE BLOOD VI STRP: ORAL_STRIP | 12 refills | Status: AC

## 2021-12-06 MED ORDER — INSULIN ASPART 100 UNIT/ML IJ SOLN
8.0000 [IU] | Freq: Once | INTRAMUSCULAR | Status: AC
  Administered 2021-12-06: 8 [IU] via SUBCUTANEOUS

## 2021-12-06 MED ORDER — FREESTYLE LANCETS MISC: 12 refills | Status: AC

## 2021-12-06 MED ORDER — SODIUM CHLORIDE 0.9 % IV BOLUS
1000.0000 mL | Freq: Once | INTRAVENOUS | Status: AC
  Administered 2021-12-06: 1000 mL via INTRAVENOUS

## 2021-12-06 NOTE — ED Provider Notes (Signed)
[facility] EMERGENCY DEPT Provider Note   CSN: [id number] Arrival date & time: 12/06/21  1849     History {Add pertinent medical, surgical, social history, OB history to HPI:1} Chief Complaint  Patient presents with   Hyperglycemia    [name] is a 46 y.o. female.  She was seen yesterday for elevated blood sugars and blurry vision.  She was given fluids and insulin and her PCP had already started her on metformin.  She has had 2 doses of metformin today.  Still noticed her blood sugar was high and she still having blurry vision and feeling a little overwhelmed with her diabetes diagnosis.  No nausea vomiting diarrhea fevers chills.  The history is provided by the patient and a relative.  Hyperglycemia Blood sugar level PTA:  400s Severity:  Moderate Timing:  Constant Progression:  Unchanged Chronicity:  New Diabetes status:  Non-diabetic Current diabetic therapy:  Metformin 500 bid Context: new diabetes diagnosis   Relieved by:  Nothing Ineffective treatments:  Oral agents Associated symptoms: blurred vision, fatigue and polyuria   Associated symptoms: no abdominal pain, no altered mental status, no chest pain, no dysuria, no fever and no shortness of breath        Home Medications Prior to Admission medications   Medication Sig Start Date End Date Taking? Authorizing Provider  albuterol (PROVENTIL HFA;VENTOLIN HFA) 108 (90 Base) MCG/ACT inhaler Inhale 2 puffs into the lungs as needed for wheezing or shortness of breath.    [provider]  blood glucose meter kit and supplies KIT Dispense based on patient and insurance preference. Use up to four times daily as directed. 12/05/21   Fransico Meadow, PA-C  Calcium Carb-Cholecalciferol (CALCIUM + D3 PO) Take 1 tablet by mouth daily. When able to remember Patient not taking: Reported on 10/23/2021    [provider]  cetirizine (ZYRTEC ALLERGY) 10 MG tablet Take 1 tablet (10 mg total) by  mouth daily. Patient not taking: Reported on 10/23/2021 07/04/21   Jeanie Sewer, NP  clindamycin (CLINDAGEL) 1 % gel Apply topically 2 (two) times daily. 12/05/21   Tawnya Crook, MD  Cyanocobalamin (B-12 PO) Take 1 capsule by mouth daily. When able to remember Patient not taking: Reported on 10/23/2021    [provider]  fluticasone (FLONASE) 50 MCG/ACT nasal spray Place 1 spray into both nostrils in the morning and at bedtime. 07/04/21   Jeanie Sewer, NP  ibuprofen (ADVIL) 600 MG tablet Take 1 tablet (600 mg total) by mouth every 6 (six) hours as needed for fever or headache. Patient not taking: Reported on 10/23/2021 10/10/21   Radene Gunning, MD  Iron-FA-B Cmp-C-Biot-Probiotic (FUSION PLUS) CAPS Take 1 tablet by mouth in the morning. 11/13/21   Hughie Closs, PA-C  metFORMIN (GLUCOPHAGE) 500 MG tablet Take 1 tablet (500 mg total) by mouth 2 (two) times daily with a meal. 12/05/21   Tawnya Crook, MD  ondansetron (ZOFRAN-ODT) 4 MG disintegrating tablet Take 1 tablet (4 mg total) by mouth every 6 (six) hours as needed for nausea. 10/10/21   Radene Gunning, MD      Allergies    Shellfish allergy, Other, and Latex    Review of Systems   Review of Systems  Constitutional:  Positive for fatigue. Negative for fever.  HENT:  Negative for sore throat.   Eyes:  Positive for blurred vision and visual disturbance.  Respiratory:  Negative for shortness of breath.   Cardiovascular:  Negative for chest pain.  Gastrointestinal:  Negative for abdominal pain.  Endocrine: Positive for polyuria.  Genitourinary:  Negative for dysuria.    Physical Exam Updated Vital Signs BP 117/85 (BP Location: Left Arm)   Pulse 89   Temp 97.8 F (36.6 C)   Resp 18   Ht _0  (1.626 m)   Wt 81.6 kg   LMP 08/13/2021 (Approximate)   SpO2 100%   BMI 30.90 kg/m  Physical Exam Vitals and nursing note reviewed.  Constitutional:      General: She is not in acute distress.    Appearance: Normal  appearance. She is well-developed.  HENT:     Head: Normocephalic and atraumatic.  Eyes:     Conjunctiva/sclera: Conjunctivae normal.  Cardiovascular:     Rate and Rhythm: Normal rate and regular rhythm.     Heart sounds: No murmur heard. Pulmonary:     Effort: Pulmonary effort is normal. No respiratory distress.     Breath sounds: Normal breath sounds.  Abdominal:     Palpations: Abdomen is soft.     Tenderness: There is no abdominal tenderness. There is no guarding or rebound.  Musculoskeletal:        General: No swelling.     Cervical back: Neck supple.  Skin:    General: Skin is warm and dry.     Capillary Refill: Capillary refill takes less than 2 seconds.  Neurological:     General: No focal deficit present.     Mental Status: She is alert.     Sensory: No sensory deficit.     Motor: No weakness.     Gait: Gait normal.     ED Results / Procedures / Treatments   Labs (all labs ordered are listed, but only abnormal results are displayed) Labs Reviewed  CBG MONITORING, ED - Abnormal; Notable for the following components:      Result Value   Glucose-Capillary 471 (*)    All other components within normal limits  BASIC METABOLIC PANEL  CBC WITH DIFFERENTIAL/PLATELET  URINALYSIS, ROUTINE W REFLEX MICROSCOPIC    EKG None  Radiology No results found.  Procedures Procedures  {Document cardiac monitor, telemetry assessment procedure when appropriate:1}  Medications Ordered in ED Medications  sodium chloride 0.9 % bolus 1,000 mL (has no administration in time range)  insulin aspart (novoLOG) injection 8 Units (has no administration in time range)    ED Course/ Medical Decision Making/ A&P                           Medical Decision Making Amount and/or Complexity of Data Reviewed Labs: ordered.  Risk Prescription drug management.   This patient complains of ***; this involves an extensive number of treatment Options and is a complaint that carries with  it a high risk of complications and morbidity. The differential includes ***  I ordered, reviewed and interpreted labs, which included *** I ordered medication *** and reviewed PMP when indicated. I ordered imaging studies which included *** and I independently    visualized and interpreted imaging which showed *** Additional history obtained from *** Previous records obtained and reviewed *** I consulted *** and discussed lab and imaging findings and discussed disposition.  Cardiac monitoring reviewed, *** Social determinants considered, *** Critical Interventions: ***  After the interventions stated above, I reevaluated the patient and found *** Admission and further testing considered, ***   {Document critical care time when appropriate:1} {Document review of labs and clinical  decision tools ie heart score, Chads2Vasc2 etc:1}  {Document your independent review of radiology images, and any outside records:1} {Document your discussion with family members, caretakers, and with consultants:1} {Document social determinants of health affecting pt's care:1} {Document your decision making why or why not admission, treatments were needed:1} Final Clinical Impression(s) / ED Diagnoses Final diagnoses:  None    Rx / DC Orders ED Discharge Orders     None

## 2021-12-06 NOTE — Discharge Instructions (Signed)
You were seen in the emergency department for evaluation of elevated blood sugar.  You were given some IV fluids and insulin with improvement in your sugar.  Please continue your metformin and follow-up with your primary care doctor.  We have put in a referral for the diabetic educator and they may reach out to you this week.  Return to the emergency department if any worsening or concerning symptoms

## 2021-12-06 NOTE — ED Triage Notes (Signed)
Patient arrives ambulatory to triage with complaints of elevated blood sugar (438).  Patient see here yesterday for blurry vision and newly diagnosed with Diabetes yesterday. She did take her metformin as prescribed  No pain.

## 2021-12-08 ENCOUNTER — Ambulatory Visit: Payer: BC Managed Care – PPO | Admitting: Family Medicine

## 2021-12-08 ENCOUNTER — Other Ambulatory Visit: Payer: Self-pay | Admitting: *Deleted

## 2021-12-08 ENCOUNTER — Telehealth: Payer: Self-pay | Admitting: Family Medicine

## 2021-12-08 ENCOUNTER — Encounter: Payer: Self-pay | Admitting: Family Medicine

## 2021-12-08 VITALS — BP 122/66 | HR 74 | Temp 97.7°F | Ht 64.0 in | Wt 184.0 lb

## 2021-12-08 DIAGNOSIS — E1165 Type 2 diabetes mellitus with hyperglycemia: Secondary | ICD-10-CM

## 2021-12-08 MED ORDER — FREESTYLE LIBRE 2 SENSOR MISC: 1.0000 | 99 refills | Status: DC

## 2021-12-08 MED ORDER — METFORMIN HCL 1000 MG PO TABS: 1000.0000 mg | ORAL_TABLET | Freq: Two times a day (BID) | ORAL | 1 refills | Status: DC

## 2021-12-08 MED ORDER — FREESTYLE LIBRE 14 DAY READER DEVI: 1.0000 | Freq: Once | 1 refills | Status: AC

## 2021-12-08 MED ORDER — ACCU-CHEK GUIDE VI STRP: ORAL_STRIP | 12 refills | Status: DC

## 2021-12-08 MED ORDER — LANTUS SOLOSTAR 100 UNIT/ML ~~LOC~~ SOPN: 20.0000 [IU] | PEN_INJECTOR | Freq: Every day | SUBCUTANEOUS | 99 refills | Status: DC

## 2021-12-08 MED ORDER — "PEN NEEDLES 5/16"" 31G X 8 MM MISC": 1.0000 | Freq: Every day | 3 refills | Status: DC

## 2021-12-08 NOTE — Telephone Encounter (Signed)
Pt advised to follow Home Care instructions.    Patient Name: [name] Gender: Female DOB: [birth year] Age: 47 Y 1 M 20 D Return Phone Number: [phone] (Primary), [phone] (Secondary) Address: [place]/ State/ Zip: [place]  [address] [facility] Client Site [facility] Night Contact Type Call Who Is Calling Patient / Member / Family / Caregiver Call Type Triage / Clinical Relationship To Patient Self Return Phone Number [phone] (Primary) Chief Complaint Blood Sugar High Reason for Call Symptomatic / Request for Health Information Initial Comment Caller states BS 297; discovered 2 days ago diabetic no other sx. Translation No Nurse Assessment Nurse: Glean Salvo, RN, Magda Paganini Date/Time Eilene Ghazi Time): 12/07/2021 1:04:12 PM Confirm and document reason for call. If symptomatic, describe symptoms. ---Caller states that her blood sugar is 297. Just diagnosed with diabetes two days. Started metformin Saturday. Does the patient have any new or worsening symptoms? ---Yes Will a triage be completed? ---Yes Related visit to physician within the last 2 weeks? ---Yes Does the PT have any chronic conditions? (i.e. diabetes, asthma, this includes High risk factors for pregnancy, etc.) ---Yes List chronic conditions. ---Diabetes, anemia Is the patient pregnant or possibly pregnant? (Ask all females between the ages of 18-55) ---No Is this a behavioral health or substance abuse call? ---No Guidelines Guideline Title Affirmed Question Affirmed Notes Nurse Date/Time (Eastern Time) Diabetes - High Blood Sugar [1] Blood glucose 240 - 300 mg/dL (13.3 - 16.7 mmol/ L) AND [2] does not use insulin (e.g., not insulin-dependent; [name] 12/07/2021 1:04:10 PM Guidelines Guideline Title Affirmed Question Affirmed Notes Nurse Date/Time ([place] Time) most people with  type 2 diabetes) Disp. Time Eilene Ghazi Time) Disposition Final User 12/07/2021 1:18:49 PM Home Care Yes Glean Salvo RN, Magda Paganini Final Disposition 12/07/2021 1:18:49 PM Home Care Yes Glean Salvo, RN, Christa See Disagree/Comply Comply Caller Understands Yes PreDisposition Call Doctor Care Advice Given Per Guideline HOME CARE: * You should be able to treat this at home. HIGH BLOOD SUGAR (HYPERGLYCEMIA): * Definition: Fasting blood glucose of 126 mg/dL (7.0 mmol/L) or above, or random blood glucose over 200 mg/dL (11.1 mmol/L). CARE ADVICE given per Diabetes - High Blood Sugar (Adult) guideline. CALL BACK IF: * Blood glucose over 300 mg/dL (16.7 mmol/L), two or more times in a row. * Urine ketones become moderate or large (or more than 1+); if you check blood ketones, blood ketone test is over 1.4 mmol/L * Vomiting lasting over 4 hours or unable to drink any fluids * Rapid breathing occurs * You have more questions * You become worse

## 2021-12-08 NOTE — Progress Notes (Signed)
Subjective:     Patient ID: [name], female    DOB: [birth year], 46 y.o.   MRN: [id number]  Chief Complaint  Patient presents with   Follow-up    ER follow-up on dm     HPI-here wdaughter  New dx of DM type 2 since few days ago.  In ER twice for high sugars.  Pt taking metformin 562m bid - 200's today.  244 fasting.  266 2 hrs after breakfast. For drinks-just milk and water. Not sure what to eat/drink.  ER placed referral for nutritionist/DM teaching.    Health Maintenance Due  Topic Date Due   Diabetic kidney evaluation - Urine ACR  Never done   Hepatitis C Screening  Never done   COLONOSCOPY (Pts 45-464yrInsurance coverage will need to be confirmed)  Never done    Past Medical History:  Diagnosis Date   Anemia    Asthma    Blood transfusion without reported diagnosis    Gestational diabetes    Uterine fibroid     Past Surgical History:  Procedure Laterality Date   arm surgery     CERVICAL CERCLAGE     CESAREAN SECTION     DILATION AND CURETTAGE OF UTERUS     HYSTERECTOMY ABDOMINAL WITH SALPINGECTOMY Bilateral 10/08/2021   Procedure: ABDOMINAL HYSTERECTOMY WITH BILATERAL  SALPINGECTOMY;  Surgeon: DuRadene GunningMD;  [facility] Service: [facility]cology;  Laterality: Bilateral;   IR RADIOLOGIST EVAL & MGMT  09/04/2021   TOOTH EXTRACTION      Outpatient Medications Prior to Visit  Medication Sig Dispense Refill   albuterol (PROVENTIL HFA;VENTOLIN HFA) 108 (90 Base) MCG/ACT inhaler Inhale 2 puffs into the lungs as needed for wheezing or shortness of breath.     blood glucose meter kit and supplies KIT Dispense based on patient and insurance preference. Use up to four times daily as directed. 1 each 0   clindamycin (CLINDAGEL) 1 % gel Apply topically 2 (two) times daily. 30 g 1   fluticasone (FLONASE) 50 MCG/ACT nasal spray Place 1 spray into both nostrils in the morning and at bedtime. 16 g 2   glucose blood (ACCU-CHEK GUIDE) test strip Use as instructed 100  each 12   glucose blood test strip Use as instructed 100 each 12   Iron-FA-B Cmp-C-Biot-Probiotic (FUSION PLUS) CAPS Take 1 tablet by mouth in the morning. 30 capsule 2   Lancets (FREESTYLE) lancets Use as instructed 100 each 12   ondansetron (ZOFRAN-ODT) 4 MG disintegrating tablet Take 1 tablet (4 mg total) by mouth every 6 (six) hours as needed for nausea. 20 tablet 0   metFORMIN (GLUCOPHAGE) 500 MG tablet Take 1 tablet (500 mg total) by mouth 2 (two) times daily with a meal. 180 tablet 0   Calcium Carb-Cholecalciferol (CALCIUM + D3 PO) Take 1 tablet by mouth daily. When able to remember (Patient not taking: Reported on 10/23/2021)     cetirizine (ZYRTEC ALLERGY) 10 MG tablet Take 1 tablet (10 mg total) by mouth daily. (Patient not taking: Reported on 10/23/2021) 30 tablet 5   Cyanocobalamin (B-12 PO) Take 1 capsule by mouth daily. When able to remember (Patient not taking: Reported on 10/23/2021)     ibuprofen (ADVIL) 600 MG tablet Take 1 tablet (600 mg total) by mouth every 6 (six) hours as needed for fever or headache. (Patient not taking: Reported on 10/23/2021) 30 tablet 0   No facility-administered medications prior to visit.    Allergies  Allergen Reactions  Shellfish Allergy Itching and Swelling    Throat swells    Other Itching and Swelling    Hazelnut. Swelling of throat    Latex Itching, Rash and Other (See Comments)    Burning    ROS neg/noncontributory except as noted HPI/below      Objective:     BP 122/66   Pulse 74   Temp 97.7 F (36.5 C) (Temporal)   Ht _0  (1.626 m)   Wt 184 lb (83.5 kg)   LMP 08/13/2021 (Approximate)   SpO2 99%   BMI 31.58 kg/m  Wt Readings from Last 3 Encounters:  12/08/21 184 lb (83.5 kg)  12/06/21 180 lb (81.6 kg)  12/05/21 179 lb 14.3 oz (81.6 kg)    Physical Exam   Gen: WDWN NAD HEENT: NCAT, conjunctiva not injected, sclera nonicteric MSK: no gross abnormalities.  NEURO: A&O x3.  CN II-XII intact.  PSYCH: normal mood. Good  eye contact     Assessment & Plan:   Problem List Items Addressed This Visit   None Visit Diagnoses     Type 2 diabetes mellitus with hyperglycemia, without long-term current use of insulin (HCC)    -  Primary   Relevant Medications   metFORMIN (GLUCOPHAGE) 1000 MG tablet   insulin glargine (LANTUS SOLOSTAR) 100 UNIT/ML Solostar Pen   Continuous Blood Gluc Receiver (FREESTYLE LIBRE 14 DAY READER) DEVI   Continuous Blood Gluc Sensor (FREESTYLE LIBRE 2 SENSOR) MISC     1.  Type 2 diabetes with hyperglycemia-new diagnosis.  Patient is a bit overwhelmed with diagnosis, monitoring, meds, diet.  Spent some time discussing basics of diet.  Advised to start exercising with a goal of 30 minutes 5 days a week.  Hopefully her daughters will walk with her to support her.  Will increase metformin to 1000 mg twice daily.  Will start Lantus 10 units daily.  Daughter works here, so she will report back frequently on progress.  Will adjust Lantus as needed.  Trying to avoid SU, but may do short-term.  Referral in place for diabetic teaching.  Will order freestyle libre.  Follow-up in 2 weeks.  Spent 30 minutes discussing above and educating patient.  Meds ordered this encounter  Medications   metFORMIN (GLUCOPHAGE) 1000 MG tablet    Sig: Take 1 tablet (1,000 mg total) by mouth 2 (two) times daily with a meal.    Dispense:  180 tablet    Refill:  1   insulin glargine (LANTUS SOLOSTAR) 100 UNIT/ML Solostar Pen    Sig: Inject 20 Units into the skin daily.    Dispense:  15 mL    Refill:  PRN   Insulin Pen Needle (PEN NEEDLES 31GX5/16") 31G X 8 MM MISC    Sig: 1 Needle by Does not apply route daily in the afternoon.    Dispense:  100 each    Refill:  3   Continuous Blood Gluc Receiver (FREESTYLE LIBRE 14 DAY READER) DEVI    Sig: 1 each by Does not apply route once for 1 dose.    Dispense:  1 each    Refill:  1   Continuous Blood Gluc Sensor (FREESTYLE LIBRE 2 SENSOR) MISC    Sig: 1 each by Does not  apply route every 14 (fourteen) days.    Dispense:  1 each    Refill:  PRN    Wellington Hampshire, MD

## 2021-12-08 NOTE — Patient Instructions (Signed)
Check sugar 2x/day Fasting, before meal, 2 hrs after a meal.  Insulin 10 units once day.    Metformin '1000mg'$  2x/day.

## 2021-12-08 NOTE — Telephone Encounter (Signed)
LVM to schedule pt for ED follow up today.

## 2021-12-09 NOTE — Addendum Note (Signed)
Addended byHildred Alamin on: 12/09/2021 10:09 AM   Modules accepted: Orders

## 2021-12-10 ENCOUNTER — Other Ambulatory Visit: Payer: Medicaid Other

## 2021-12-10 NOTE — Patient Outreach (Signed)
Medicaid Managed Care Social Work Note  12/10/2021 Name:  [name] MRN:  [id number] DOB:  Jan 27, 1976  [name] is an 46 y.o. year old female who is a primary patient of Tawnya Crook, MD.  The Medicaid Managed Care Coordination team was consulted for assistance with:  Food Insecurity Community Resources   [name] was given information about Medicaid Managed Care Coordination team services today. [name] agreed to services and verbal consent obtained.  Engaged with patient  for by telephone forinitial visit in response to referral for case management and/or care coordination services.   Assessments/Interventions:  Review of past medical history, allergies, medications, health status, including review of consultants reports, laboratory and other test data, was performed as part of comprehensive evaluation and provision of chronic care management services.  SDOH: (Social Determinant of Health) assessments and interventions performed: SDOH Interventions    Flowsheet Row Office Visit from 09/05/2021 in [facility] Visit from 06/06/2021 in [facility]  SDOH Interventions    Depression Interventions/Treatment  Currently on Treatment Referral to Psychiatry, Medication     BSW completed a telephone outreach with patient. She stated she and her 54 year old daughter do not have a permanent home. She was receiving child support , but her daughters fathers recently passed away and she no longer receives it. She has checked to see if she could receive death benefits. Patient states she just recently started receiving foodstamps. BSW mailed and emailed patient of list of resources for income driven housing and food pantries. Patient does have transportation but states the vehicle does need an oil change and tires, but she makes due. No other resources are needed at this time.  Advanced Directives Status:  Not addressed in  this encounter.  Care Plan                 Allergies  Allergen Reactions   Shellfish Allergy Itching and Swelling    Throat swells    Other Itching and Swelling    Hazelnut. Swelling of throat    Latex Itching, Rash and Other (See Comments)    Burning     Medications Reviewed Today     Reviewed by Tawnya Crook, MD (Physician) on 12/08/21 at 1350  Med List Status: <None>   Medication Order Taking? Sig Documenting Provider Last Dose Status Informant  albuterol (PROVENTIL HFA;VENTOLIN HFA) 108 (90 Base) MCG/ACT inhaler [id number] Yes Inhale 2 puffs into the lungs as needed for wheezing or shortness of breath. [provider] Taking Active Self, Child, Pharmacy Records           Med Note (CRUTHIS, CHLOE C   Wed Oct 08, 2021  7:28 AM) Pt is unsure of last dose.   blood glucose meter kit and supplies KIT [id number] Yes Dispense based on patient and insurance preference. Use up to four times daily as directed. Fransico Meadow, PA-C Taking Active   Calcium Carb-Cholecalciferol (CALCIUM + D3 PO) [id number] No Take 1 tablet by mouth daily. When able to remember  Patient not taking: Reported on 10/23/2021   [provider] Not Taking Active Self, Child, Pharmacy Records  cetirizine (ZYRTEC ALLERGY) 10 MG tablet [id number] No Take 1 tablet (10 mg total) by mouth daily.  Patient not taking: Reported on 10/23/2021   Jeanie Sewer, NP Not Taking Active Self, Child, Pharmacy Records           Med Note (CRUTHIS, CHLOE C  Wed Oct 08, 2021  7:28 AM) Pt is unsure of last dose.   clindamycin (CLINDAGEL) 1 % gel [id number] Yes Apply topically 2 (two) times daily. Tawnya Crook, MD Taking Active   Continuous Blood Gluc Receiver (FREESTYLE LIBRE 14 DAY READER) [place] [id number] Yes 1 each by Does not apply route once for 1 dose. Tawnya Crook, MD  Active   Continuous Blood Gluc Sensor (FREESTYLE LIBRE 2 SENSOR) [place] [id number] Yes 1 each by Does not apply route every 14 (fourteen) days.  Tawnya Crook, MD  Active   Cyanocobalamin (B-12 PO) [id number] No Take 1 capsule by mouth daily. When able to remember  Patient not taking: Reported on 10/23/2021   [provider] Not Taking Active Self, Child, Pharmacy Records  fluticasone [facility]) 50 MCG/ACT nasal spray [id number] Yes Place 1 spray into both nostrils in the morning and at bedtime. Jeanie Sewer, NP Taking Active Self, Child, Pharmacy Records           Med Note ([place] Oct 08, 2021  7:28 AM) Pt is unsure of last dose.   glucose blood (ACCU-CHEK GUIDE) test strip [id number] Yes Use as instructed Tawnya Crook, MD Taking Active   glucose blood test strip [id number] Yes Use as instructed Hayden Rasmussen, MD Taking Active   ibuprofen (ADVIL) 600 MG tablet [id number] No Take 1 tablet (600 mg total) by mouth every 6 (six) hours as needed for fever or headache.  Patient not taking: Reported on 10/23/2021   Radene Gunning, MD Not Taking Active   insulin glargine (LANTUS SOLOSTAR) 100 UNIT/ML Solostar Pen [id number] Yes Inject 20 Units into the skin daily. Tawnya Crook, MD  Active   Insulin Pen Needle (PEN NEEDLES 31GX5/16") 31G X 8 MM MISC [id number] Yes 1 Needle by Does not apply route daily in the afternoon. Tawnya Crook, MD  Active   Iron-FA-B Cmp-C-Biot-Probiotic (FUSION PLUS) CAPS [id number] Yes Take 1 tablet by mouth in the morning. Hughie Closs, PA-C Taking Active   Lancets (FREESTYLE) lancets [id number] Yes Use as instructed Hayden Rasmussen, MD Taking Active   metFORMIN (GLUCOPHAGE) 1000 MG tablet [id number]  Take 1 tablet (1,000 mg total) by mouth 2 (two) times daily with a meal. Tawnya Crook, MD  Active   ondansetron (ZOFRAN-ODT) 4 MG disintegrating tablet [id number] Yes Take 1 tablet (4 mg total) by mouth every 6 (six) hours as needed for nausea. Radene Gunning, MD Taking Active             Patient Active Problem List   Diagnosis Date Noted   Postoperative anemia due to  acute blood loss 10/09/2021   Allergic rhinitis 07/04/2021   Mixed anxiety and depressive disorder 06/06/2021   Vitamin D deficiency 07/05/2020   Iron deficiency anemia due to chronic blood loss 06/12/2019   Menorrhagia with regular cycle 06/12/2019    Conditions to be addressed/monitored per PCP order:   community resources  There are no care plans that you recently modified to display for this patient.   Follow up:  Patient agrees to Care Plan and Follow-up.  Plan: The Managed Medicaid care management team will reach out to the patient again over the next 10 days.  Date/time of next scheduled Social Work care management/care coordination outreach:  12/24/21  Mickel Fuchs, Arita Miss, [place] Medicaid Team  [phone]

## 2021-12-10 NOTE — Patient Instructions (Signed)
Visit Information  Ms. Eustache was given information about Medicaid Managed Care team care coordination services as a part of their [place] Medicaid benefit. Luisa Hart Stell verbally consentedto engagement with the [facility] Managed Care team.   If you are experiencing a medical emergency, please call 911 or report to your local emergency department or urgent care.   If you have a non-emergency medical problem during routine business hours, please contact your provider's office and ask to speak with a nurse.   For questions related to your [facility] health plan, please call: [phone]  OR visit the member homepage at: [email or web address]  If you would like to schedule transportation through your [facility] plan, please call the following number at least 2 days in advance of your appointment: [phone]  If you are experiencing a behavioral health crisis, call the [place] at [phone]). The line is available 24 hours a day, seven days a week.  If you would like help to quit smoking, call 1-800-QUIT-NOW 775-449-6626) OR Espaol: 1-855-Djelo-Ya (3-267-124-5809) o para ms informacin haga clic aqu or Text READY to 200-400 to register via text  Ms. Dack - following are the goals we discussed in your visit today:   Goals Addressed   None       Social Worker will follow up on 12/24/21.   Mickel Fuchs, BSW, [place] Managed Medicaid Team  [phone]   Following is a copy of your plan of care:  There are no care plans that you recently modified to display for this patient.

## 2021-12-16 ENCOUNTER — Other Ambulatory Visit: Payer: Self-pay | Admitting: *Deleted

## 2021-12-16 ENCOUNTER — Telehealth: Payer: Self-pay | Admitting: Family Medicine

## 2021-12-16 DIAGNOSIS — E1165 Type 2 diabetes mellitus with hyperglycemia: Secondary | ICD-10-CM

## 2021-12-16 MED ORDER — FREESTYLE LIBRE 2 SENSOR MISC: 1.0000 | 99 refills | Status: DC

## 2021-12-16 MED ORDER — FREESTYLE LIBRE 14 DAY READER DEVI: 1.0000 | Freq: Every day | 1 refills | Status: DC

## 2021-12-16 NOTE — Telephone Encounter (Signed)
Rx sent to the pharmacy.

## 2021-12-16 NOTE — Telephone Encounter (Signed)
  LAST APPOINTMENT DATE:  12/08/21  NEXT APPOINTMENT DATE:  12/22/21  MEDICATION: Continuous Blood Gluc Sensor (FREESTYLE LIBRE 2 SENSOR) MISC   Is the patient out of medication? Device broke  PHARMACY:[id number] [place], [place] - [id number], [id number]: [phone] Fax: [phone]  Patient states: -Device was working well but then "went off" last night and won't work correctly  - She would like a whole new kit

## 2021-12-18 LAB — HM DIABETES EYE EXAM

## 2021-12-22 ENCOUNTER — Ambulatory Visit: Payer: BC Managed Care – PPO | Admitting: Family Medicine

## 2021-12-22 ENCOUNTER — Encounter: Payer: Self-pay | Admitting: Family Medicine

## 2021-12-22 VITALS — BP 110/60 | HR 84 | Temp 98.1°F | Ht 64.0 in | Wt 183.4 lb

## 2021-12-22 DIAGNOSIS — E1165 Type 2 diabetes mellitus with hyperglycemia: Secondary | ICD-10-CM | POA: Diagnosis not present

## 2021-12-22 DIAGNOSIS — Z1211 Encounter for screening for malignant neoplasm of colon: Secondary | ICD-10-CM

## 2021-12-22 DIAGNOSIS — R410 Disorientation, unspecified: Secondary | ICD-10-CM

## 2021-12-22 NOTE — Progress Notes (Signed)
Subjective:     Patient ID: [name], female    DOB: [birth year], 46 y.o.   MRN: [id number]  Chief Complaint  Patient presents with   Follow-up    2 week follow-up for dm Not fasting     HPI-here w/daughter  DM-new.  Working on diet/exercise.  Less urination.  Not taking insulin as sugars were going low.  Metformin 563m bid, didn't increase.  Less sugary drinks.  DM teaching in Jan. Wearing cgm am 110's, pm 100-158.  Occ 196.  Vision still bad.  Reading glasses help.  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  Confused at times, off balance at times since DM dx.  No HA.  Occ dizzy.  Forgetful. New.  Health Maintenance Due  Topic Date Due   FOOT EXAM  Never done   OPHTHALMOLOGY EXAM  Never done   Diabetic kidney evaluation - Urine ACR  Never done   Hepatitis C Screening  Never done   COLONOSCOPY (Pts 45-439yrInsurance coverage will need to be confirmed)  Never done    Past Medical History:  Diagnosis Date   Anemia    Asthma    Blood transfusion without reported diagnosis    Gestational diabetes    Uterine fibroid     Past Surgical History:  Procedure Laterality Date   arm surgery     CERVICAL CERCLAGE     CESAREAN SECTION     DILATION AND CURETTAGE OF UTERUS     HYSTERECTOMY ABDOMINAL WITH SALPINGECTOMY Bilateral 10/08/2021   Procedure: ABDOMINAL HYSTERECTOMY WITH BILATERAL  SALPINGECTOMY;  Surgeon: DuRadene GunningMD;  [facility] Service: Gynecology;  Laterality: Bilateral;   IR RADIOLOGIST EVAL & MGMT  09/04/2021   TOOTH EXTRACTION      Outpatient Medications Prior to Visit  Medication Sig Dispense Refill   albuterol (PROVENTIL HFA;VENTOLIN HFA) 108 (90 Base) MCG/ACT inhaler Inhale 2 puffs into the lungs as needed for wheezing or shortness of breath.     blood glucose meter kit and supplies KIT Dispense based on patient and insurance preference. Use up to four times daily as directed. 1 each 0   Calcium Carb-Cholecalciferol (CALCIUM + D3 PO) Take 1  tablet by mouth daily. When able to remember     cetirizine (ZYRTEC ALLERGY) 10 MG tablet Take 1 tablet (10 mg total) by mouth daily. 30 tablet 5   clindamycin (CLINDAGEL) 1 % gel Apply topically 2 (two) times daily. 30 g 1   Continuous Blood Gluc Receiver (FREESTYLE LIBRE 14 DAY READER) DEVI 1 kit by Does not apply route daily. 1 each 1   Continuous Blood Gluc Sensor (FREESTYLE LIBRE 2 SENSOR) MISC 1 each by Does not apply route every 14 (fourteen) days. 1 each PRN   Cyanocobalamin (B-12 PO) Take 1 capsule by mouth daily. When able to remember     fluticasone (FLONASE) 50 MCG/ACT nasal spray Place 1 spray into both nostrils in the morning and at bedtime. 16 g 2   glucose blood (ACCU-CHEK GUIDE) test strip Use as instructed 100 each 12   glucose blood test strip Use as instructed 100 each 12   ibuprofen (ADVIL) 600 MG tablet Take 1 tablet (600 mg total) by mouth every 6 (six) hours as needed for fever or headache. 30 tablet 0   insulin glargine (LANTUS SOLOSTAR) 100 UNIT/ML Solostar Pen Inject 20 Units into the skin daily. 15 mL PRN   Insulin Pen Needle (PEN NEEDLES 31GX5/16") 31G X 8  MM MISC 1 Needle by Does not apply route daily in the afternoon. 100 each 3   Iron-FA-B Cmp-C-Biot-Probiotic (FUSION PLUS) CAPS Take 1 tablet by mouth in the morning. 30 capsule 2   Lancets (FREESTYLE) lancets Use as instructed 100 each 12   metFORMIN (GLUCOPHAGE) 1000 MG tablet Take 1 tablet (1,000 mg total) by mouth 2 (two) times daily with a meal. 180 tablet 1   ondansetron (ZOFRAN-ODT) 4 MG disintegrating tablet Take 1 tablet (4 mg total) by mouth every 6 (six) hours as needed for nausea. 20 tablet 0   No facility-administered medications prior to visit.    Allergies  Allergen Reactions   Shellfish Allergy Itching and Swelling    Throat swells    Other Itching and Swelling    Hazelnut. Swelling of throat    Latex Itching, Rash and Other (See Comments)    Burning    ROS neg/noncontributory except as  noted HPI/below      Objective:     BP 110/60   Pulse 84   Temp 98.1 F (36.7 C) (Temporal)   Ht _0  (1.626 m)   Wt 183 lb 6 oz (83.2 kg)   LMP 08/13/2021 (Approximate)   SpO2 98%   BMI 31.48 kg/m  Wt Readings from Last 3 Encounters:  12/22/21 183 lb 6 oz (83.2 kg)  12/08/21 184 lb (83.5 kg)  12/06/21 180 lb (81.6 kg)    Physical Exam   Gen: WDWN NAD HEENT: NCAT, conjunctiva not injected, sclera nonicteric, eomi NECK:  supple, no thyromegaly, no nodes, no carotid bruits CARDIAC: RRR, S1S2+, no murmur.  LUNGS: CTAB. No wheezes EXT:  no edema MSK: no gross abnormalities.  NEURO: A&O x3.  CN II-XII intact.  PSYCH: normal mood. Good eye contact     Assessment & Plan:   Problem List Items Addressed This Visit       Endocrine   Type 2 diabetes mellitus with hyperglycemia, without long-term current use of insulin ([place]) - Primary   Other Visit Diagnoses     Screen for colon cancer       Relevant Orders   Ambulatory referral to Gastroenterology   Confusion          DM type 2-newer dx. Chronic.  DM teaching sch in Jan.  Pt stopped insulin as sugars decreased and working on diet/exercise.  Didn't increase meformin to 1000 bid so only doing 500bid.  As sugars acceptable, continue.  F/u 3 mo.  If increase, let me know.   Confusion-?hormonal, sugars, other.  Pt has been anemic, had surgery, etc.  Keep in touch as far as symptoms as may need further testing.  No orders of the defined types were placed in this encounter.   Wellington Hampshire, MD

## 2021-12-22 NOTE — Patient Instructions (Signed)
It was very nice to see you today!  Happy Holidays!   PLEASE NOTE:  If you had any lab tests please let us know if you have not heard back within a few days. You may see your results on MyChart before we have a chance to review them but we will give you a call once they are reviewed by [place]. If we ordered any referrals today, please let us know if you have not heard from their office within the next week.   Please try these tips to maintain a healthy lifestyle:  Eat most of your calories during the day when you are active. Eliminate processed foods including packaged sweets (pies, cakes, cookies), reduce intake of potatoes, white bread, white pasta, and white rice. Look for whole grain options, oat flour or almond flour.  Each meal should contain half fruits/vegetables, one quarter protein, and one quarter carbs (no bigger than a computer mouse).  Cut down on sweet beverages. This includes juice, soda, and sweet tea. Also watch fruit intake, though this is a healthier sweet option, it still contains natural sugar! Limit to 3 servings daily.  Drink at least 1 glass of water with each meal and aim for at least 8 glasses per day  Exercise at least 150 minutes every week.

## 2021-12-24 ENCOUNTER — Other Ambulatory Visit: Payer: Medicaid Other

## 2021-12-24 ENCOUNTER — Encounter: Payer: Self-pay | Admitting: Family

## 2021-12-24 NOTE — Patient Outreach (Signed)
Medicaid Managed Care Social Work Note  12/24/2021 Name:  [name] MRN:  [id number] DOB:  [birth year]  [name] is an 46 y.o. year old female who is a primary patient of Maria Crook, MD.  The Medicaid Managed Care Coordination team was consulted for assistance with:  Community Resources   [name] was given information about Medicaid Managed Care Coordination team services today. Delevan Patient agreed to services and verbal consent obtained.  Engaged with patient  for by telephone forfollow up visit in response to referral for case management and/or care coordination services.   Assessments/Interventions:  Review of past medical history, allergies, medications, health status, including review of consultants reports, laboratory and other test data, was performed as part of comprehensive evaluation and provision of chronic care management services.  SDOH: (Social Determinant of Health) assessments and interventions performed: SDOH Interventions    Flowsheet Row Office Visit from 09/05/2021 in [place] Visit from 06/06/2021 in [place]  SDOH Interventions    Depression Interventions/Treatment  Currently on Treatment Referral to Psychiatry, Medication     BSW completed a telephone outreach with patient. She stated she has tried contacting [facility] but has been unable to get anyone on the line. BSW suggested calling them first thing in the morning. Patient stated she did need some assistance with her storage fee, BSW researched and was able to locate [facility] in [place]. No other resources are needed at this time.   Advanced Directives Status:  Not addressed in this encounter.  Care Plan                 Allergies  Allergen Reactions   Shellfish Allergy Itching and Swelling    Throat swells    Other Itching and Swelling    Hazelnut. Swelling of throat    Latex Itching, Rash and  Other (See Comments)    Burning     Medications Reviewed Today     Reviewed by Maria Crook, MD (Physician) on 12/22/21 at 1137  Med List Status: <None>   Medication Order Taking? Sig Documenting Provider Last Dose Status Informant  albuterol (PROVENTIL HFA;VENTOLIN HFA) 108 (90 Base) MCG/ACT inhaler [id number] Yes Inhale 2 puffs into the lungs as needed for wheezing or shortness of breath. [provider] Taking Active Self, Child, Pharmacy Records           Med Note (CRUTHIS, CHLOE C   Wed Oct 08, 2021  7:28 AM) Pt is unsure of last dose.   blood glucose meter kit and supplies KIT [id number] Yes Dispense based on patient and insurance preference. Use up to four times daily as directed. Fransico Meadow, PA-C Taking Active   Calcium Carb-Cholecalciferol (CALCIUM + D3 PO) [id number] Yes Take 1 tablet by mouth daily. When able to remember [provider] Taking Active Self, Child, Pharmacy Records  cetirizine (ZYRTEC ALLERGY) 10 MG tablet [id number] Yes Take 1 tablet (10 mg total) by mouth daily. Jeanie Sewer, NP Taking Active Self, Child, Pharmacy Records           Med Note ([place] Oct 08, 2021  7:28 AM) Pt is unsure of last dose.   clindamycin (CLINDAGEL) 1 % gel [id number] Yes Apply topically 2 (two) times daily. Maria Crook, MD Taking Active   Continuous Blood Gluc Receiver (FREESTYLE LIBRE [address] READER) [place] [id number] Yes 1 kit by Does not apply route  daily. Maria Crook, MD Taking Active   Continuous Blood Gluc Sensor (FREESTYLE LIBRE 2 SENSOR) [place] [id number] Yes 1 each by Does not apply route every 14 (fourteen) days. Maria Crook, MD Taking Active   Cyanocobalamin (B-12 PO) [id number] Yes Take 1 capsule by mouth daily. When able to remember [provider] Taking Active Self, Child, Pharmacy Records  fluticasone (FLONASE) 50 MCG/ACT nasal spray [id number] Yes Place 1 spray into both nostrils in the morning and at bedtime. Jeanie Sewer, NP Taking Active Self, Child, Pharmacy Records           Med Note ([place] Oct 08, 2021  7:28 AM) Pt is unsure of last dose.   glucose blood (ACCU-CHEK GUIDE) test strip [id number] Yes Use as instructed Maria Crook, MD Taking Active   glucose blood test strip [id number] Yes Use as instructed Hayden Rasmussen, MD Taking Active   ibuprofen (ADVIL) 600 MG tablet [id number] Yes Take 1 tablet (600 mg total) by mouth every 6 (six) hours as needed for fever or headache. Radene Gunning, MD Taking Active   insulin glargine (LANTUS SOLOSTAR) 100 UNIT/ML Solostar Pen [id number] Yes Inject 20 Units into the skin daily. Maria Crook, MD Taking Active   Insulin Pen Needle (PEN NEEDLES 31GX5/16") 31G X 8 MM MISC [id number] Yes 1 Needle by Does not apply route daily in the afternoon. Maria Crook, MD Taking Active   Iron-FA-B Cmp-C-Biot-Probiotic (FUSION PLUS) CAPS [id number] Yes Take 1 tablet by mouth in the morning. Hughie Closs, PA-C Taking Active   Lancets (FREESTYLE) lancets [id number] Yes Use as instructed Hayden Rasmussen, MD Taking Active   metFORMIN (GLUCOPHAGE) 1000 MG tablet [id number] Yes Take 1 tablet (1,000 mg total) by mouth 2 (two) times daily with a meal. Maria Crook, MD Taking Active   ondansetron (ZOFRAN-ODT) 4 MG disintegrating tablet [id number] Yes Take 1 tablet (4 mg total) by mouth every 6 (six) hours as needed for nausea. Radene Gunning, MD Taking Active             Patient Active Problem List   Diagnosis Date Noted   Type 2 diabetes mellitus with hyperglycemia, without long-term current use of insulin ([place]) 12/22/2021   Postoperative anemia due to acute blood loss 10/09/2021   Allergic rhinitis 07/04/2021   Mixed anxiety and depressive disorder 06/06/2021   Vitamin D deficiency 07/05/2020   Iron deficiency anemia due to chronic blood loss 06/12/2019   Menorrhagia with regular cycle 06/12/2019    Conditions to be addressed/monitored per  PCP order:   community resources  There are no care plans that you recently modified to display for this patient.   Follow up:  Patient agrees to Care Plan and Follow-up.  Plan: The Managed Medicaid care management team will reach out to the patient again over the next 30 days.  Date/time of next scheduled Social Work care management/care coordination outreach:  01/23/22  Mickel Fuchs, Arita Miss, [place] Medicaid Team  [phone]

## 2021-12-24 NOTE — Patient Outreach (Signed)
  Medicaid Managed Care   Unsuccessful Outreach Note  12/24/2021 Name: [name] MRN: [id number] DOB: 06/25/1975  Referred by: Tawnya Crook, MD Reason for referral : High Risk Managed Medicaid (MM social work telephone outreach )   An unsuccessful telephone outreach was attempted today. The patient was referred to the case management team for assistance with care management and care coordination.   Follow Up Plan: A HIPAA compliant phone message was left for the patient providing contact information and requesting a return call.   Mickel Fuchs, BSW, [place] Managed Medicaid Team  [phone]

## 2021-12-24 NOTE — Patient Instructions (Addendum)
  Medicaid Managed Care   Unsuccessful Outreach Note  12/24/2021 Name: [name] MRN: [id number] DOB: 02/05/1975  Referred by: Tawnya Crook, MD Reason for referral : High Risk Managed Medicaid (MM social work telephone outreach )   An unsuccessful telephone outreach was attempted today. The patient was referred to the case management team for assistance with care management and care coordination.   Follow Up Plan: A HIPAA compliant phone message was left for the patient providing contact information and requesting a return call.  Mickel Fuchs, BSW, [place] Managed Medicaid Team  [phone] Visit Information  Ms. Zegarra was given information about Medicaid Managed Care team care coordination services as a part of their [place] Medicaid benefit. Luisa Hart Tabbert verbally consentedto engagement with the [facility] Managed Care team.   If you are experiencing a medical emergency, please call 911 or report to your local emergency department or urgent care.   If you have a non-emergency medical problem during routine business hours, please contact your provider's office and ask to speak with a nurse.   For questions related to your [facility] health plan, please call: [phone]  OR visit the member homepage at: [email or web address]  If you would like to schedule transportation through your [facility] plan, please call the following number at least 2 days in advance of your appointment: [phone]  If you are experiencing a behavioral health crisis, call the [place] at [phone]). The line is available 24 hours a day, seven days a week.  If you would like help to quit smoking, call 1-800-QUIT-NOW (818)366-3401) OR Espaol: 1-855-Djelo-Ya (0-211-173-5670) o para ms  informacin haga clic aqu or Text READY to 200-400 to register via text  Ms. Antonelli - following are the goals we discussed in your visit today:   Goals Addressed   None      Social Worker will follow up on 01/23/22.   Mickel Fuchs, BSW, [place] Managed Medicaid Team  [phone]   Following is a copy of your plan of care:  There are no care plans that you recently modified to display for this patient.

## 2022-01-09 ENCOUNTER — Other Ambulatory Visit: Payer: Self-pay | Admitting: *Deleted

## 2022-01-09 MED ORDER — FUSION PLUS PO CAPS: 1.0000 | ORAL_CAPSULE | Freq: Every morning | ORAL | 2 refills | Status: DC

## 2022-01-12 ENCOUNTER — Encounter: Payer: Self-pay | Admitting: Family

## 2022-01-12 ENCOUNTER — Inpatient Hospital Stay (HOSPITAL_BASED_OUTPATIENT_CLINIC_OR_DEPARTMENT_OTHER): Payer: BC Managed Care – PPO | Admitting: Family

## 2022-01-12 ENCOUNTER — Inpatient Hospital Stay: Payer: BC Managed Care – PPO | Attending: Family

## 2022-01-12 VITALS — BP 124/78 | HR 65 | Temp 98.3°F | Resp 17 | Wt 182.0 lb

## 2022-01-12 DIAGNOSIS — N920 Excessive and frequent menstruation with regular cycle: Secondary | ICD-10-CM

## 2022-01-12 DIAGNOSIS — D259 Leiomyoma of uterus, unspecified: Secondary | ICD-10-CM | POA: Diagnosis not present

## 2022-01-12 DIAGNOSIS — D500 Iron deficiency anemia secondary to blood loss (chronic): Secondary | ICD-10-CM | POA: Insufficient documentation

## 2022-01-12 DIAGNOSIS — R5383 Other fatigue: Secondary | ICD-10-CM | POA: Insufficient documentation

## 2022-01-12 DIAGNOSIS — Z79899 Other long term (current) drug therapy: Secondary | ICD-10-CM | POA: Diagnosis not present

## 2022-01-12 DIAGNOSIS — R0602 Shortness of breath: Secondary | ICD-10-CM | POA: Insufficient documentation

## 2022-01-12 DIAGNOSIS — D709 Neutropenia, unspecified: Secondary | ICD-10-CM

## 2022-01-12 LAB — CMP (CANCER CENTER ONLY)
ALT: 10 U/L (ref 0–44)
AST: 14 U/L — ABNORMAL LOW (ref 15–41)
Albumin: 4.4 g/dL (ref 3.5–5.0)
Alkaline Phosphatase: 48 U/L (ref 38–126)
Anion gap: 6 (ref 5–15)
BUN: 10 mg/dL (ref 6–20)
CO2: 32 mmol/L (ref 22–32)
Calcium: 9.7 mg/dL (ref 8.9–10.3)
Chloride: 103 mmol/L (ref 98–111)
Creatinine: 0.63 mg/dL (ref 0.44–1.00)
GFR, Estimated: >60 mL/min (ref >60)
Glucose, Bld: 102 mg/dL — ABNORMAL HIGH (ref 70–99)
Potassium: 3.6 mmol/L (ref 3.5–5.1)
Sodium: 141 mmol/L (ref 135–145)
Total Bilirubin: 1.2 mg/dL (ref 0.3–1.2)
Total Protein: 7.1 g/dL (ref 6.5–8.1)

## 2022-01-12 LAB — CBC WITH DIFFERENTIAL/PLATELET
Abs Immature Granulocytes: 0 10*3/uL (ref 0.00–0.07)
Basophils Absolute: 0 10*3/uL (ref 0.0–0.1)
Basophils Relative: 1 %
Eosinophils Absolute: 0.1 10*3/uL (ref 0.0–0.5)
Eosinophils Relative: 3 %
HCT: 42.6 % (ref 36.0–46.0)
Hemoglobin: 13.6 g/dL (ref 12.0–15.0)
Immature Granulocytes: 0 %
Lymphocytes Relative: 48 %
Lymphs Abs: 1.6 10*3/uL (ref 0.7–4.0)
MCH: 26.2 pg (ref 26.0–34.0)
MCHC: 31.9 g/dL (ref 30.0–36.0)
MCV: 81.9 fL (ref 80.0–100.0)
Monocytes Absolute: 0.3 10*3/uL (ref 0.1–1.0)
Monocytes Relative: 8 %
Neutro Abs: 1.3 10*3/uL — ABNORMAL LOW (ref 1.7–7.7)
Neutrophils Relative %: 40 %
Platelets: 229 10*3/uL (ref 150–400)
RBC: 5.2 MIL/uL — ABNORMAL HIGH (ref 3.87–5.11)
RDW: 17.5 % — ABNORMAL HIGH (ref 11.5–15.5)
WBC: 3.3 10*3/uL — ABNORMAL LOW (ref 4.0–10.5)
nRBC: 0 % (ref 0.0–0.2)

## 2022-01-12 LAB — IRON AND IRON BINDING CAPACITY (CC-WL,HP ONLY)
Iron: 73 ug/dL (ref 28–170)
Saturation Ratios: 30 % (ref 10.4–31.8)
TIBC: 246 ug/dL — ABNORMAL LOW (ref 250–450)
UIBC: 173 ug/dL (ref 148–442)

## 2022-01-12 LAB — FERRITIN: Ferritin: 78 ng/mL (ref 11–307)

## 2022-01-12 LAB — VITAMIN B12: Vitamin B-12: 774 pg/mL (ref 180–914)

## 2022-01-12 NOTE — Progress Notes (Signed)
Hematology and Oncology Follow Up Visit  [name] [id number] 1975-08-26 46 y.o. 01/12/2022   Principle Diagnosis:  Iron deficiency anemia   Current Therapy:        IV iron as indicated   Interim History:  [name] is here today with her daughter for follow-up. She is feeling fatigued and states that she has also been feeling depressed. She denies feelings of harming herself or others.  She states that she will follow-up with her PCP regarding her depression. She has not noted any blood loss. No bruising or petechiae.  No fever, chills, n/v, cough, rash, dizziness, chest pain, palpitations, abdominal pain or changes in bowel or bladder habits.  She has occasional SOB with over exertion and takes a break to rest when needed.  She states that her appetite comes and goes. She states that her blood sugars have been high and she is waiting to get an appointment with a nutritionist to figure out what foods she can eat. She is doing her best to stay well hydrated. Her weight is stable at 182 lbs.   ECOG Performance Status: 1 - Symptomatic but completely ambulatory  Medications:  Allergies as of 01/12/2022       Reactions   Shellfish Allergy Itching, Swelling   Throat swells    Other Itching, Swelling   Hazelnut. Swelling of throat    Latex Itching, Rash, Other (See Comments)   Burning         Medication List        Accurate as of January 12, 2022 11:10 AM. If you have any questions, ask your nurse or doctor.          albuterol 108 (90 Base) MCG/ACT inhaler Commonly known as: VENTOLIN HFA Inhale 2 puffs into the lungs as needed for wheezing or shortness of breath.   B-12 PO Take 1 capsule by mouth daily. When able to remember   blood glucose meter kit and supplies Kit Dispense based on patient and insurance preference. Use up to four times daily as directed.   CALCIUM + D3 PO Take 1 tablet by mouth daily. When able to remember   cetirizine 10 MG  tablet Commonly known as: ZyrTEC Allergy Take 1 tablet (10 mg total) by mouth daily.   clindamycin 1 % gel Commonly known as: Clindagel Apply topically 2 (two) times daily.   fluticasone 50 MCG/ACT nasal spray Commonly known as: FLONASE Place 1 spray into both nostrils in the morning and at bedtime.   freestyle lancets Use as instructed   FreeStyle Libre 14 Day Reader Kerrin Mo 1 kit by Does not apply route daily.   FreeStyle Libre 2 Sensor Misc 1 each by Does not apply route every 14 (fourteen) days.   Fusion Plus Caps Take 1 tablet by mouth in the morning.   glucose blood test strip Use as instructed   Accu-Chek Guide test strip Generic drug: glucose blood Use as instructed   ibuprofen 600 MG tablet Commonly known as: ADVIL Take 1 tablet (600 mg total) by mouth every 6 (six) hours as needed for fever or headache.   Lantus SoloStar 100 UNIT/ML Solostar Pen Generic drug: insulin glargine Inject 20 Units into the skin daily.   metFORMIN 1000 MG tablet Commonly known as: GLUCOPHAGE Take 1 tablet (1,000 mg total) by mouth 2 (two) times daily with a meal.   ondansetron 4 MG disintegrating tablet Commonly known as: ZOFRAN-ODT Take 1 tablet (4 mg total) by mouth every 6 (six) hours  as needed for nausea.   PEN NEEDLES 31GX5/16" 31G X 8 MM Misc 1 Needle by Does not apply route daily in the afternoon.        Allergies:  Allergies  Allergen Reactions   Shellfish Allergy Itching and Swelling    Throat swells    Other Itching and Swelling    Hazelnut. Swelling of throat    Latex Itching, Rash and Other (See Comments)    Burning     Past Medical History, Surgical history, Social history, and Family History were reviewed and updated.  Review of Systems: All other 10 point review of systems is negative.   Physical Exam:  weight is 182 lb 0.3 oz (82.6 kg). Her oral temperature is 98.3 F (36.8 C). Her blood pressure is 124/78 and her pulse is 65. Her respiration is  17 and oxygen saturation is 100%.   Wt Readings from Last 3 Encounters:  01/12/22 182 lb 0.3 oz (82.6 kg)  12/22/21 183 lb 6 oz (83.2 kg)  12/08/21 184 lb (83.5 kg)    Ocular: Sclerae unicteric, pupils equal, round and reactive to light Ear-nose-throat: Oropharynx clear, dentition fair Lymphatic: No cervical or supraclavicular adenopathy Lungs no rales or rhonchi, good excursion bilaterally Heart regular rate and rhythm, no murmur appreciated Abd soft, nontender, positive bowel sounds MSK no focal spinal tenderness, no joint edema Neuro: non-focal, well-oriented, appropriate affect Breasts: Deferred   Lab Results  Component Value Date   WBC 3.3 (L) 01/12/2022   HGB 13.6 01/12/2022   HCT 42.6 01/12/2022   MCV 81.9 01/12/2022   PLT 229 01/12/2022   Lab Results  Component Value Date   FERRITIN 8 (L) 11/13/2021   IRON 16 (L) 11/13/2021   TIBC 354 11/13/2021   UIBC 338 11/13/2021   IRONPCTSAT 5 (L) 11/13/2021   Lab Results  Component Value Date   RETICCTPCT 1.5 11/13/2021   RBC 5.20 (H) 01/12/2022   RETICCTABS 138.14 (H) 05/27/2015   No results found for: "KPAFRELGTCHN", "LAMBDASER", "KAPLAMBRATIO" No results found for: "IGGSERUM", "IGA", "IGMSERUM" No results found for: "TOTALPROTELP", "ALBUMINELP", "A1GS", "A2GS", "BETS", "BETA2SER", "GAMS", "MSPIKE", "SPEI"   Chemistry      Component Value Date/Time   NA 131 (L) 12/06/2021 1949   NA 141 05/27/2015 1441   K 4.4 12/06/2021 1949   K 4.0 05/27/2015 1441   CL 97 (L) 12/06/2021 1949   CO2 22 12/06/2021 1949   CO2 29 05/27/2015 1441   BUN 15 12/06/2021 1949   BUN 9.9 05/27/2015 1441   CREATININE 0.91 12/06/2021 1949   CREATININE 0.77 06/16/2021 1442   CREATININE 0.8 05/27/2015 1441      Component Value Date/Time   CALCIUM 9.2 12/06/2021 1949   CALCIUM 9.8 05/27/2015 1441   ALKPHOS 56 06/16/2021 1442   ALKPHOS 44 05/27/2015 1441   AST 14 (L) 06/16/2021 1442   AST 17 05/27/2015 1441   ALT 9 06/16/2021 1442    ALT 11 05/27/2015 1441   BILITOT 1.0 06/16/2021 1442   BILITOT 0.76 05/27/2015 1441       Impression and Plan: [name] is a very pleasant 46 yo African American female with history of iron deficiency anemia secondary to heavy cycles with uterine fibroids. She has had a hysterectomy.  Iron studies are pending. We will replace if needed.  Follow-up in 4 months.   Lottie Dawson, NP 12/11/202311:10 AM

## 2022-01-20 ENCOUNTER — Other Ambulatory Visit: Payer: Self-pay | Admitting: Family Medicine

## 2022-01-20 MED ORDER — METFORMIN HCL 500 MG PO TABS: 500.0000 mg | ORAL_TABLET | Freq: Two times a day (BID) | ORAL | 1 refills | Status: DC

## 2022-02-09 ENCOUNTER — Encounter: Payer: Self-pay | Admitting: Family Medicine

## 2022-02-09 ENCOUNTER — Ambulatory Visit (INDEPENDENT_AMBULATORY_CARE_PROVIDER_SITE_OTHER): Payer: BC Managed Care – PPO | Admitting: Family Medicine

## 2022-02-09 VITALS — BP 126/84 | HR 77 | Temp 98.7°F | Resp 16 | Wt 183.0 lb

## 2022-02-09 DIAGNOSIS — G4489 Other headache syndrome: Secondary | ICD-10-CM

## 2022-02-09 DIAGNOSIS — R6889 Other general symptoms and signs: Secondary | ICD-10-CM

## 2022-02-09 NOTE — Progress Notes (Signed)
Subjective:     Patient ID: [name], female    DOB: 08-27-1975, 47 y.o.   MRN: [id number]  Chief Complaint  Patient presents with   Altered Mental Status    Wanting to move forward with MRI    HPI-here w/daughter Dejase Confusion-since just before DM dx-Oct.  Forgetful-not getting lost. Forgets names at times. Cooking fine.  Forgetting what people said to her 2 hrs prior.  Sugars ok now.  Metformin 500bid.  Some HA-new. Mostly R side-going on 2 months.  No dizziness. No cp.  Getting Some exercise. Sleep fair.  Some depression-no meds.  Not falling. Not weak. No slurred speech.     Health Maintenance Due  Topic Date Due   FOOT EXAM  Never done   OPHTHALMOLOGY EXAM  Never done   Diabetic kidney evaluation - Urine ACR  Never done   Hepatitis C Screening  Never done   DTaP/Tdap/Td (1 - Tdap) 10/18/1992   COLONOSCOPY (Pts 45-22yr Insurance coverage will need to be confirmed)  Never done   COVID-19 Vaccine (3 - Pfizer risk series) 10/12/2019    Past Medical History:  Diagnosis Date   Anemia    Asthma    Blood transfusion without reported diagnosis    Gestational diabetes    Uterine fibroid     Past Surgical History:  Procedure Laterality Date   arm surgery     CERVICAL CERCLAGE     CESAREAN SECTION     DILATION AND CURETTAGE OF UTERUS     HYSTERECTOMY ABDOMINAL WITH SALPINGECTOMY Bilateral 10/08/2021   Procedure: ABDOMINAL HYSTERECTOMY WITH BILATERAL  SALPINGECTOMY;  Surgeon: DRadene Gunning MD;  [facility] [facility]  Service: Gynecology;  Laterality: Bilateral;   IR RADIOLOGIST EVAL & MGMT  09/04/2021   TOOTH EXTRACTION      Outpatient Medications Prior to Visit  Medication Sig Dispense Refill   albuterol (PROVENTIL HFA;VENTOLIN HFA) 108 (90 Base) MCG/ACT inhaler Inhale 2 puffs into the lungs as needed for wheezing or shortness of breath.     blood glucose meter kit and supplies KIT Dispense based on patient and insurance preference. Use up to four times daily as  directed. 1 each 0   Calcium Carb-Cholecalciferol (CALCIUM + D3 PO) Take 1 tablet by mouth daily. When able to remember     cetirizine (ZYRTEC ALLERGY) 10 MG tablet Take 1 tablet (10 mg total) by mouth daily. 30 tablet 5   clindamycin (CLINDAGEL) 1 % gel Apply topically 2 (two) times daily. 30 g 1   Continuous Blood Gluc Receiver (FREESTYLE LIBRE 14 DAY READER) [place] 1 kit by Does not apply route daily. 1 each 1   Continuous Blood Gluc Sensor (FREESTYLE LIBRE 2 SENSOR) MISC 1 each by Does not apply route every 14 (fourteen) days. 1 each PRN   Cyanocobalamin (B-12 PO) Take 1 capsule by mouth daily. When able to remember     fluticasone (FLONASE) 50 MCG/ACT nasal spray Place 1 spray into both nostrils in the morning and at bedtime. 16 g 2   glucose blood (ACCU-CHEK GUIDE) test strip Use as instructed 100 each 12   glucose blood test strip Use as instructed 100 each 12   ibuprofen (ADVIL) 600 MG tablet Take 1 tablet (600 mg total) by mouth every 6 (six) hours as needed for fever or headache. 30 tablet 0   Insulin Pen Needle (PEN NEEDLES 31GX5/16") 31G X 8 MM MISC 1 Needle by Does not apply route daily in the afternoon. 100  each 3   Iron-FA-B Cmp-C-Biot-Probiotic (FUSION PLUS) CAPS Take 1 tablet by mouth in the morning. 30 capsule 2   Lancets (FREESTYLE) lancets Use as instructed 100 each 12   metFORMIN (GLUCOPHAGE) 500 MG tablet Take 1 tablet (500 mg total) by mouth 2 (two) times daily with a meal. 180 tablet 1   ondansetron (ZOFRAN-ODT) 4 MG disintegrating tablet Take 1 tablet (4 mg total) by mouth every 6 (six) hours as needed for nausea. 20 tablet 0   insulin glargine (LANTUS SOLOSTAR) 100 UNIT/ML Solostar Pen Inject 20 Units into the skin daily. 15 mL PRN   No facility-administered medications prior to visit.    Allergies  Allergen Reactions   Shellfish Allergy Itching and Swelling    Throat swells    Other Itching and Swelling    Hazelnut. Swelling of throat    Latex Itching, Rash and  Other (See Comments)    Burning    ROS neg/noncontributory except as noted HPI/below      Objective:     BP 126/84   Pulse 77   Temp 98.7 F (37.1 C) (Temporal)   Resp 16   Wt 183 lb (83 kg)   LMP 08/13/2021 (Approximate)   SpO2 98%   BMI 31.41 kg/m  Wt Readings from Last 3 Encounters:  02/09/22 183 lb (83 kg)  01/12/22 182 lb 0.3 oz (82.6 kg)  12/22/21 183 lb 6 oz (83.2 kg)    Physical Exam   Gen: WDWN NAD HEENT: NCAT, conjunctiva not injected, sclera nonicteric NECK:  supple, no thyromegaly, no nodes, no carotid bruits CARDIAC: RRR, S1S2+, no murmur. DP 2+B LUNGS: CTAB. No wheezes ABDOMEN:  BS+, soft, NTND, No HSM, no masses EXT:  no edema MSK: no gross abnormalities.  NEURO: A&O x3.  CN II-XII intact. F-n,RAM, pronator drift neg.  Can name 15 animals in <1 minute PSYCH: normal mood. Good eye contact  Reviewed labs     Assessment & Plan:   Problem List Items Addressed This Visit   None Visit Diagnoses     Forgetfulness    -  Primary   Relevant Orders   MR Brain Wo Contrast   Other headache syndrome       Relevant Orders   MR Brain Wo Contrast     1.  Forgetfulness-not sure if due to combinations of profound anemia, adjustment disorder, life changes, hysterectomy, anesthesia, new diabetes, dementia, tumor, other.  Diabetes is stable.  Labs in November were unrevealing.  Will check MRI of the brain.  Advised to do more engaging activities.  Consider restarting treatment for depression. 2.  New headaches-mostly on the right side but can be frontal.  Will check MRI of the brain.  No orders of the defined types were placed in this encounter.   Wellington Hampshire, MD

## 2022-02-20 ENCOUNTER — Other Ambulatory Visit: Payer: Medicaid Other

## 2022-03-12 ENCOUNTER — Encounter: Payer: Self-pay | Admitting: Family Medicine

## 2022-03-12 ENCOUNTER — Ambulatory Visit (INDEPENDENT_AMBULATORY_CARE_PROVIDER_SITE_OTHER): Payer: BC Managed Care – PPO | Admitting: Family Medicine

## 2022-03-12 VITALS — BP 100/70 | HR 67 | Temp 98.2°F | Ht 64.0 in | Wt 181.5 lb

## 2022-03-12 DIAGNOSIS — F418 Other specified anxiety disorders: Secondary | ICD-10-CM | POA: Diagnosis not present

## 2022-03-12 DIAGNOSIS — D500 Iron deficiency anemia secondary to blood loss (chronic): Secondary | ICD-10-CM | POA: Diagnosis not present

## 2022-03-12 DIAGNOSIS — Z1159 Encounter for screening for other viral diseases: Secondary | ICD-10-CM | POA: Diagnosis not present

## 2022-03-12 DIAGNOSIS — J309 Allergic rhinitis, unspecified: Secondary | ICD-10-CM | POA: Diagnosis not present

## 2022-03-12 DIAGNOSIS — E1165 Type 2 diabetes mellitus with hyperglycemia: Secondary | ICD-10-CM | POA: Diagnosis not present

## 2022-03-12 LAB — IBC + FERRITIN
Ferritin: 75.9 ng/mL (ref 10.0–291.0)
Iron: 98 ug/dL (ref 42–145)
Saturation Ratios: 37.4 % (ref 20.0–50.0)
TIBC: 261.8 ug/dL (ref 250.0–450.0)
Transferrin: 187 mg/dL — ABNORMAL LOW (ref 212.0–360.0)

## 2022-03-12 LAB — CBC WITH DIFFERENTIAL/PLATELET
Basophils Absolute: 0 10*3/uL (ref 0.0–0.1)
Basophils Relative: 1.2 % (ref 0.0–3.0)
Eosinophils Absolute: 0.2 10*3/uL (ref 0.0–0.7)
Eosinophils Relative: 4.4 % (ref 0.0–5.0)
HCT: 40.2 % (ref 36.0–46.0)
Hemoglobin: 13.7 g/dL (ref 12.0–15.0)
Lymphocytes Relative: 41.8 % (ref 12.0–46.0)
Lymphs Abs: 1.5 10*3/uL (ref 0.7–4.0)
MCHC: 34 g/dL (ref 30.0–36.0)
MCV: 84.8 fl (ref 78.0–100.0)
Monocytes Absolute: 0.3 10*3/uL (ref 0.1–1.0)
Monocytes Relative: 7.8 % (ref 3.0–12.0)
Neutro Abs: 1.6 10*3/uL (ref 1.4–7.7)
Neutrophils Relative %: 44.8 % (ref 43.0–77.0)
Platelets: 208 10*3/uL (ref 150.0–400.0)
RBC: 4.74 Mil/uL (ref 3.87–5.11)
RDW: 16.2 % — ABNORMAL HIGH (ref 11.5–15.5)
WBC: 3.6 10*3/uL — ABNORMAL LOW (ref 4.0–10.5)

## 2022-03-12 LAB — COMPREHENSIVE METABOLIC PANEL
ALT: 9 U/L (ref 0–35)
AST: 12 U/L (ref 0–37)
Albumin: 4.2 g/dL (ref 3.5–5.2)
Alkaline Phosphatase: 50 U/L (ref 39–117)
BUN: 13 mg/dL (ref 6–23)
CO2: 28 mEq/L (ref 19–32)
Calcium: 9.2 mg/dL (ref 8.4–10.5)
Chloride: 103 mEq/L (ref 96–112)
Creatinine, Ser: 0.65 mg/dL (ref 0.40–1.20)
GFR: 104.13 mL/min (ref >60.00)
Glucose, Bld: 88 mg/dL (ref 70–99)
Potassium: 3.8 mEq/L (ref 3.5–5.1)
Sodium: 142 mEq/L (ref 135–145)
Total Bilirubin: 1.7 mg/dL — ABNORMAL HIGH (ref 0.2–1.2)
Total Protein: 6.8 g/dL (ref 6.0–8.3)

## 2022-03-12 LAB — MICROALBUMIN / CREATININE URINE RATIO
Creatinine,U: 192.9 mg/dL
Microalb Creat Ratio: 0.6 mg/g (ref 0.0–30.0)
Microalb, Ur: 1.1 mg/dL (ref 0.0–1.9)

## 2022-03-12 LAB — HEMOGLOBIN A1C: Hgb A1c MFr Bld: 6.8 % — ABNORMAL HIGH (ref 4.6–6.5)

## 2022-03-12 LAB — LIPID PANEL
Cholesterol: 170 mg/dL (ref 0–200)
HDL: 46.8 mg/dL (ref >39.00)
LDL Cholesterol: 103 mg/dL — ABNORMAL HIGH (ref 0–99)
NonHDL: 122.93
Total CHOL/HDL Ratio: 4
Triglycerides: 102 mg/dL (ref 0.0–149.0)
VLDL: 20.4 mg/dL (ref 0.0–40.0)

## 2022-03-12 MED ORDER — ESCITALOPRAM OXALATE 10 MG PO TABS: 10.0000 mg | ORAL_TABLET | Freq: Every day | ORAL | 1 refills | Status: DC

## 2022-03-12 MED ORDER — FLUTICASONE PROPIONATE 50 MCG/ACT NA SUSP: 1.0000 | Freq: Two times a day (BID) | NASAL | 5 refills | Status: DC

## 2022-03-12 MED ORDER — ALBUTEROL SULFATE HFA 108 (90 BASE) MCG/ACT IN AERS: 2.0000 | INHALATION_SPRAY | RESPIRATORY_TRACT | 1 refills | Status: DC | PRN

## 2022-03-12 MED ORDER — ACCU-CHEK GUIDE VI STRP: ORAL_STRIP | 3 refills | Status: DC

## 2022-03-12 MED ORDER — CETIRIZINE HCL 10 MG PO TABS: 10.0000 mg | ORAL_TABLET | Freq: Every day | ORAL | 1 refills | Status: DC

## 2022-03-12 MED ORDER — B-12 1000 MCG PO TBCR: 1000.0000 ug | EXTENDED_RELEASE_TABLET | Freq: Every day | ORAL | 3 refills | Status: AC

## 2022-03-12 NOTE — Progress Notes (Signed)
Awesome.  Keep up the good work.  Can stop the iron

## 2022-03-12 NOTE — Progress Notes (Signed)
Subjective:     Patient ID: [name], female    DOB: [birth year], 47 y.o.   MRN: [id number]  Chief Complaint  Patient presents with   Follow-up    1 month follow-up     HPI-daughter came 1/2 way thru interview  DM type 2-80's-160(once). Metformin 500bid-doing ok Moods-bad dreams.  No meds-stopped before surgery.  Worries all the time. No SI Anemia-taking iron daily.  Occ takes b12 Allergies-needs meds renewed.  Health Maintenance Due  Topic Date Due   FOOT EXAM  Never done   OPHTHALMOLOGY EXAM  Never done   Diabetic kidney evaluation - Urine ACR  Never done   Hepatitis C Screening  Never done   DTaP/Tdap/Td (1 - Tdap) 10/18/1992   COLONOSCOPY (Pts 45-23yr Insurance coverage will need to be confirmed)  Never done    Past Medical History:  Diagnosis Date   Anemia    Asthma    Blood transfusion without reported diagnosis    Gestational diabetes    Uterine fibroid     Past Surgical History:  Procedure Laterality Date   arm surgery     CERVICAL CERCLAGE     CESAREAN SECTION     DILATION AND CURETTAGE OF UTERUS     HYSTERECTOMY ABDOMINAL WITH SALPINGECTOMY Bilateral 10/08/2021   Procedure: ABDOMINAL HYSTERECTOMY WITH BILATERAL  SALPINGECTOMY;  Surgeon: DRadene Gunning MD;  [facility]  Service: Gynecology;  Laterality: Bilateral;   IR RADIOLOGIST EVAL & MGMT  09/04/2021   TOOTH EXTRACTION      Outpatient Medications Prior to Visit  Medication Sig Dispense Refill   clindamycin (CLINDAGEL) 1 % gel Apply topically 2 (two) times daily. 30 g 1   glucose blood test strip Use as instructed 100 each 12   Iron-FA-B Cmp-C-Biot-Probiotic (FUSION PLUS) CAPS Take 1 tablet by mouth in the morning. 30 capsule 2   Lancets (FREESTYLE) lancets Use as instructed 100 each 12   metFORMIN (GLUCOPHAGE) 500 MG tablet Take 1 tablet (500 mg total) by mouth 2 (two) times daily with a meal. 180 tablet 1   glucose blood (ACCU-CHEK GUIDE) test strip Use as instructed 100 each 12    Insulin Pen Needle (PEN NEEDLES 31GX5/16") 31G X 8 MM MISC 1 Needle by Does not apply route daily in the afternoon. 100 each 3   Calcium Carb-Cholecalciferol (CALCIUM + D3 PO) Take 1 tablet by mouth daily. When able to remember (Patient not taking: Reported on 03/12/2022)     ibuprofen (ADVIL) 600 MG tablet Take 1 tablet (600 mg total) by mouth every 6 (six) hours as needed for fever or headache. (Patient not taking: Reported on 03/12/2022) 30 tablet 0   ondansetron (ZOFRAN-ODT) 4 MG disintegrating tablet Take 1 tablet (4 mg total) by mouth every 6 (six) hours as needed for nausea. (Patient not taking: Reported on 03/12/2022) 20 tablet 0   albuterol (PROVENTIL HFA;VENTOLIN HFA) 108 (90 Base) MCG/ACT inhaler Inhale 2 puffs into the lungs as needed for wheezing or shortness of breath. (Patient not taking: Reported on 03/12/2022)     blood glucose meter kit and supplies KIT Dispense based on patient and insurance preference. Use up to four times daily as directed. (Patient not taking: Reported on 03/12/2022) 1 each 0   cetirizine (ZYRTEC ALLERGY) 10 MG tablet Take 1 tablet (10 mg total) by mouth daily. (Patient not taking: Reported on 03/12/2022) 30 tablet 5   Continuous Blood Gluc Receiver (FREESTYLE LIBRE 14 DAY READER) DEVI 1 kit by Does  not apply route daily. (Patient not taking: Reported on 03/12/2022) 1 each 1   Continuous Blood Gluc Sensor (FREESTYLE LIBRE 2 SENSOR) MISC 1 each by Does not apply route every 14 (fourteen) days. (Patient not taking: Reported on 03/12/2022) 1 each PRN   Cyanocobalamin (B-12 PO) Take 1 capsule by mouth daily. When able to remember (Patient not taking: Reported on 03/12/2022)     fluticasone (FLONASE) 50 MCG/ACT nasal spray Place 1 spray into both nostrils in the morning and at bedtime. (Patient not taking: Reported on 03/12/2022) 16 g 2   No facility-administered medications prior to visit.    Allergies  Allergen Reactions   Shellfish Allergy Itching and Swelling    Throat swells     Other Itching and Swelling    Hazelnut. Swelling of throat    Latex Itching, Rash and Other (See Comments)    Burning    ROS neg/noncontributory except as noted HPI/below      Objective:     BP 100/70   Pulse 67   Temp 98.2 F (36.8 C) (Temporal)   Ht '5\' 4"'$  (1.626 m)   Wt 181 lb 8 oz (82.3 kg)   LMP 08/13/2021 (Approximate)   SpO2 99%   BMI 31.15 kg/m  Wt Readings from Last 3 Encounters:  03/12/22 181 lb 8 oz (82.3 kg)  02/09/22 183 lb (83 kg)  01/12/22 182 lb 0.3 oz (82.6 kg)    Physical Exam   Gen: WDWN NAD HEENT: NCAT, conjunctiva not injected, sclera nonicteric NECK:  supple, no thyromegaly, no nodes, no carotid bruits CARDIAC: RRR, S1S2+, no murmur. DP 2+B LUNGS: CTAB. No wheezes ABDOMEN:  BS+, soft, NTND, No HSM, no masses EXT:  no edema MSK: no gross abnormalities.  NEURO: A&O x3.  CN II-XII intact.  PSYCH: normal mood. Good eye contact-tearful     Assessment & Plan:   Problem List Items Addressed This Visit       Respiratory   Allergic rhinitis   Relevant Medications   cetirizine (ZYRTEC ALLERGY) 10 MG tablet   fluticasone (FLONASE) 50 MCG/ACT nasal spray     Endocrine   Type 2 diabetes mellitus with hyperglycemia, without long-term current use of insulin (HCC) - Primary   Relevant Orders   Comprehensive metabolic panel   Hemoglobin A1c   CBC with Differential/Platelet   Lipid panel   Microalbumin / creatinine urine ratio     Other   Iron deficiency anemia due to chronic blood loss   Relevant Medications   Cyanocobalamin (B-12) 1000 MCG TBCR   Other Relevant Orders   CBC with Differential/Platelet   IBC + Ferritin   Mixed anxiety and depressive disorder   Relevant Medications   escitalopram (LEXAPRO) 10 MG tablet   Other Visit Diagnoses     Encounter for hepatitis C screening test for low risk patient       Relevant Orders   Hepatitis C antibody      DM type 2-chronic.  Better controlled.  Cont diet.  Get more exercise.  Cont  metformin '500mg'$  bid.  Check cmp,A1C, lipid, microalb/creat ratio Iron def anemia-from vag bleeding.  S/p hyst.  On Fe.  Check cbc,iron studies-may be able to d/c. Allergic rhinitis-chronic.  Controlled on meds.  Renewed albuterol, zyrtec '10mg'$ , flonase.   Adjustment disorder-mixed dep/anx.  Pt not doing well.  Restart lexapro '10mg'$  F/u 1 mo moods.   Meds ordered this encounter  Medications   albuterol (VENTOLIN HFA) 108 (90 Base) MCG/ACT inhaler  Sig: Inhale 2 puffs into the lungs as needed for wheezing or shortness of breath.    Dispense:  1 each    Refill:  1   cetirizine (ZYRTEC ALLERGY) 10 MG tablet    Sig: Take 1 tablet (10 mg total) by mouth daily.    Dispense:  90 tablet    Refill:  1   Cyanocobalamin (B-12) 1000 MCG TBCR    Sig: Take 1,000 mcg by mouth daily. When able to remember    Dispense:  100 tablet    Refill:  3   fluticasone (FLONASE) 50 MCG/ACT nasal spray    Sig: Place 1 spray into both nostrils in the morning and at bedtime.    Dispense:  16 g    Refill:  5   glucose blood (ACCU-CHEK GUIDE) test strip    Sig: daily    Dispense:  100 each    Refill:  3   escitalopram (LEXAPRO) 10 MG tablet    Sig: Take 1 tablet (10 mg total) by mouth daily.    Dispense:  30 tablet    Refill:  1    Wellington Hampshire, MD

## 2022-03-12 NOTE — Patient Instructions (Signed)
It was very nice to see you today!  Start back on lexapro   PLEASE NOTE:  If you had any lab tests please let us know if you have not heard back within a few days. You may see your results on MyChart before we have a chance to review them but we will give you a call once they are reviewed by [place]. If we ordered any referrals today, please let us know if you have not heard from their office within the next week.   Please try these tips to maintain a healthy lifestyle:  Eat most of your calories during the day when you are active. Eliminate processed foods including packaged sweets (pies, cakes, cookies), reduce intake of potatoes, white bread, white pasta, and white rice. Look for whole grain options, oat flour or almond flour.  Each meal should contain half fruits/vegetables, one quarter protein, and one quarter carbs (no bigger than a computer mouse).  Cut down on sweet beverages. This includes juice, soda, and sweet tea. Also watch fruit intake, though this is a healthier sweet option, it still contains natural sugar! Limit to 3 servings daily.  Drink at least 1 glass of water with each meal and aim for at least 8 glasses per day  Exercise at least 150 minutes every week.

## 2022-03-13 LAB — HEPATITIS C ANTIBODY: Hepatitis C Ab: NONREACTIVE

## 2022-03-17 ENCOUNTER — Other Ambulatory Visit: Payer: BC Managed Care – PPO

## 2022-03-19 ENCOUNTER — Encounter: Payer: Self-pay | Admitting: Family

## 2022-03-19 ENCOUNTER — Telehealth: Payer: Self-pay | Admitting: *Deleted

## 2022-03-19 NOTE — Telephone Encounter (Signed)
Patient's daughter sent a message saying: Lattie Haw said Dr. Cherlynn Kaiser put the MRI in wrong for my mom, it has to be a referral not an order.

## 2022-03-20 NOTE — Telephone Encounter (Signed)
Please see message below and let me know if neurology referral need to placed.

## 2022-04-03 ENCOUNTER — Encounter: Payer: Self-pay | Admitting: Family

## 2022-04-10 ENCOUNTER — Encounter: Payer: Self-pay | Admitting: Family Medicine

## 2022-04-10 ENCOUNTER — Ambulatory Visit (INDEPENDENT_AMBULATORY_CARE_PROVIDER_SITE_OTHER): Payer: Medicaid Other | Admitting: Family Medicine

## 2022-04-10 ENCOUNTER — Encounter: Payer: Self-pay | Admitting: Family

## 2022-04-10 VITALS — BP 110/80 | HR 64 | Temp 98.0°F | Ht 64.0 in | Wt 181.4 lb

## 2022-04-10 DIAGNOSIS — E1165 Type 2 diabetes mellitus with hyperglycemia: Secondary | ICD-10-CM

## 2022-04-10 DIAGNOSIS — Z1211 Encounter for screening for malignant neoplasm of colon: Secondary | ICD-10-CM | POA: Diagnosis not present

## 2022-04-10 DIAGNOSIS — J309 Allergic rhinitis, unspecified: Secondary | ICD-10-CM | POA: Diagnosis not present

## 2022-04-10 DIAGNOSIS — F418 Other specified anxiety disorders: Secondary | ICD-10-CM | POA: Diagnosis not present

## 2022-04-10 MED ORDER — ESCITALOPRAM OXALATE 10 MG PO TABS: 10.0000 mg | ORAL_TABLET | Freq: Every day | ORAL | 1 refills | Status: DC

## 2022-04-10 MED ORDER — METFORMIN HCL 500 MG PO TABS: 500.0000 mg | ORAL_TABLET | Freq: Two times a day (BID) | ORAL | 1 refills | Status: DC

## 2022-04-10 MED ORDER — ACCU-CHEK GUIDE VI STRP: ORAL_STRIP | 3 refills | Status: AC

## 2022-04-10 MED ORDER — CETIRIZINE HCL 10 MG PO TABS: 10.0000 mg | ORAL_TABLET | Freq: Every day | ORAL | 1 refills | Status: DC

## 2022-04-10 NOTE — Progress Notes (Signed)
Subjective:     Patient ID: [name], female    DOB: 1975/05/23, 46 y.o.   MRN: [id number]  Chief Complaint  Patient presents with   Follow-up    1 month follow-up for moods Medication refills Would like a cream instead of gel for face    HPI  Moods-depression/anx-supposed to be on lexapro '10mg'$ -pharmacy didn't fill. Found old one so took for 2 wks but ran out 2 wks ago. Tries to distract self.   No SI DM-out of metformin past 2 wks.  We sent, pharm doesn't have. Checks sugars once/wk-70+  Health Maintenance Due  Topic Date Due   OPHTHALMOLOGY EXAM  Never done   DTaP/Tdap/Td (1 - Tdap) 10/18/1992   COLONOSCOPY (Pts 45-60yr Insurance coverage will need to be confirmed)  Never done    Past Medical History:  Diagnosis Date   Anemia    Asthma    Blood transfusion without reported diagnosis    Gestational diabetes    Uterine fibroid     Past Surgical History:  Procedure Laterality Date   arm surgery     CERVICAL CERCLAGE     CESAREAN SECTION     DILATION AND CURETTAGE OF UTERUS     HYSTERECTOMY ABDOMINAL WITH SALPINGECTOMY Bilateral 10/08/2021   Procedure: ABDOMINAL HYSTERECTOMY WITH BILATERAL  SALPINGECTOMY;  Surgeon: DRadene Gunning MD;  [facility] [facility]  Service: Gynecology;  Laterality: Bilateral;   IR RADIOLOGIST EVAL & MGMT  09/04/2021   TOOTH EXTRACTION      Outpatient Medications Prior to Visit  Medication Sig Dispense Refill   albuterol (VENTOLIN HFA) 108 (90 Base) MCG/ACT inhaler Inhale 2 puffs into the lungs as needed for wheezing or shortness of breath. 1 each 1   clindamycin (CLINDAGEL) 1 % gel Apply topically 2 (two) times daily. 30 g 1   Cyanocobalamin (B-12) 1000 MCG TBCR Take 1,000 mcg by mouth daily. When able to remember 100 tablet 3   fluticasone (FLONASE) 50 MCG/ACT nasal spray Place 1 spray into both nostrils in the morning and at bedtime. 16 g 5   glucose blood test strip Use as instructed 100 each 12   ibuprofen (ADVIL) 600 MG tablet Take 1  tablet (600 mg total) by mouth every 6 (six) hours as needed for fever or headache. 30 tablet 0   Iron-FA-B Cmp-C-Biot-Probiotic (FUSION PLUS) CAPS Take 1 tablet by mouth in the morning. 30 capsule 2   Lancets (FREESTYLE) lancets Use as instructed 100 each 12   ondansetron (ZOFRAN-ODT) 4 MG disintegrating tablet Take 1 tablet (4 mg total) by mouth every 6 (six) hours as needed for nausea. 20 tablet 0   cetirizine (ZYRTEC ALLERGY) 10 MG tablet Take 1 tablet (10 mg total) by mouth daily. 90 tablet 1   escitalopram (LEXAPRO) 10 MG tablet Take 1 tablet (10 mg total) by mouth daily. 30 tablet 1   glucose blood (ACCU-CHEK GUIDE) test strip daily 100 each 3   metFORMIN (GLUCOPHAGE) 500 MG tablet Take 1 tablet (500 mg total) by mouth 2 (two) times daily with a meal. 180 tablet 1   Calcium Carb-Cholecalciferol (CALCIUM + D3 PO) Take 1 tablet by mouth daily. When able to remember (Patient not taking: Reported on 03/12/2022)     No facility-administered medications prior to visit.    Allergies  Allergen Reactions   Shellfish Allergy Itching and Swelling    Throat swells    Other Itching and Swelling    Hazelnut. Swelling of throat  Latex Itching, Rash and Other (See Comments)    Burning    ROS neg/noncontributory except as noted HPI/below      Objective:     BP 110/80   Pulse 64   Temp 98 F (36.7 C) (Temporal)   Ht '5\' 4"'$  (1.626 m)   Wt 181 lb 6 oz (82.3 kg)   LMP 08/13/2021 (Approximate)   SpO2 98%   BMI 31.13 kg/m  Wt Readings from Last 3 Encounters:  04/10/22 181 lb 6 oz (82.3 kg)  03/12/22 181 lb 8 oz (82.3 kg)  02/09/22 183 lb (83 kg)    Physical Exam   Gen: WDWN NAD HEENT: NCAT, conjunctiva not injected, sclera nonicteric EXT:  no edema MSK: no gross abnormalities.  NEURO: A&O x3.  CN II-XII intact.  PSYCH: normal mood. Good eye contact     Assessment & Plan:   Problem List Items Addressed This Visit       Respiratory   Allergic rhinitis   Relevant Medications    cetirizine (ZYRTEC ALLERGY) 10 MG tablet     Endocrine   Type 2 diabetes mellitus with hyperglycemia, without long-term current use of insulin (HCC)   Relevant Medications   metFORMIN (GLUCOPHAGE) 500 MG tablet     Other   Mixed anxiety and depressive disorder - Primary   Relevant Medications   escitalopram (LEXAPRO) 10 MG tablet   Other Visit Diagnoses     Screen for colon cancer       Relevant Orders   Cologuard      Allergic rhinitis-chronic.  Controlled.  Cont zyrtec '10mg'$  daily DM type 2-chronic.  Well controlled.  Renew metformin '500mg'$  bid, cont diet/exercise.  See ophth.  Foot exam done Anx/depression-not taking lexapro-pharm.  Advised to let us know, don't run out.  Restart '10mg'$  daily.  F/u 1 mo  Meds ordered this encounter  Medications   cetirizine (ZYRTEC ALLERGY) 10 MG tablet    Sig: Take 1 tablet (10 mg total) by mouth daily.    Dispense:  90 tablet    Refill:  1   metFORMIN (GLUCOPHAGE) 500 MG tablet    Sig: Take 1 tablet (500 mg total) by mouth 2 (two) times daily with a meal.    Dispense:  180 tablet    Refill:  1   glucose blood (ACCU-CHEK GUIDE) test strip    Sig: daily    Dispense:  100 each    Refill:  3   escitalopram (LEXAPRO) 10 MG tablet    Sig: Take 1 tablet (10 mg total) by mouth daily.    Dispense:  30 tablet    Refill:  1    Wellington Hampshire, MD

## 2022-04-10 NOTE — Patient Instructions (Signed)
Sent meds to [facility] [address]  Exercise-walk daily!

## 2022-04-21 ENCOUNTER — Encounter: Payer: Self-pay | Admitting: Family

## 2022-05-11 ENCOUNTER — Encounter: Payer: Self-pay | Admitting: Family Medicine

## 2022-05-11 ENCOUNTER — Ambulatory Visit (INDEPENDENT_AMBULATORY_CARE_PROVIDER_SITE_OTHER): Payer: Medicaid Other | Admitting: Family Medicine

## 2022-05-11 VITALS — BP 120/78 | HR 57 | Temp 97.5°F | Ht 64.0 in | Wt 182.5 lb

## 2022-05-11 DIAGNOSIS — F418 Other specified anxiety disorders: Secondary | ICD-10-CM | POA: Diagnosis not present

## 2022-05-11 DIAGNOSIS — G8929 Other chronic pain: Secondary | ICD-10-CM

## 2022-05-11 DIAGNOSIS — M545 Low back pain, unspecified: Secondary | ICD-10-CM | POA: Diagnosis not present

## 2022-05-11 DIAGNOSIS — M25512 Pain in left shoulder: Secondary | ICD-10-CM

## 2022-05-11 DIAGNOSIS — E1165 Type 2 diabetes mellitus with hyperglycemia: Secondary | ICD-10-CM | POA: Diagnosis not present

## 2022-05-11 NOTE — Patient Instructions (Addendum)
[facility] at [facility]  [address] on the 1st floor Phone number [phone]   Do exercises.  Take ibuprofen/tylenol if needed.
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
## 2022-05-15 ENCOUNTER — Inpatient Hospital Stay: Payer: Medicaid Other

## 2022-05-15 ENCOUNTER — Inpatient Hospital Stay: Payer: Medicaid Other | Admitting: Family

## 2022-05-21 LAB — COLOGUARD

## 2022-06-05 ENCOUNTER — Encounter: Payer: Self-pay | Admitting: Family

## 2022-06-26 ENCOUNTER — Ambulatory Visit (INDEPENDENT_AMBULATORY_CARE_PROVIDER_SITE_OTHER): Payer: Medicaid Other | Admitting: Family Medicine

## 2022-06-26 ENCOUNTER — Encounter: Payer: Self-pay | Admitting: Family Medicine

## 2022-06-26 VITALS — BP 120/78 | HR 76 | Temp 98.1°F | Resp 16 | Ht 64.0 in | Wt 179.5 lb

## 2022-06-26 DIAGNOSIS — I872 Venous insufficiency (chronic) (peripheral): Secondary | ICD-10-CM

## 2022-06-26 NOTE — Progress Notes (Signed)
Subjective:     Patient ID: [name], female    DOB: 1975/03/31, 47 y.o.   MRN: [id number]  Chief Complaint  Patient presents with   Leg Swelling    Left leg swelling and bruising, denies any pain, itch sometimes    HPI late Left leg swelling and bruising for years.  Had started when standing on feet a lot w/work.  Has had some injury to left side of body in past, but not this.  The markings are constant and on shin and by ankle. Occasional itches.   Used to wear compression stockings but not lately.   No SOB  Health Maintenance Due  Topic Date Due   DTaP/Tdap/Td (1 - Tdap) 10/18/1992   Colonoscopy  Never done    Past Medical History:  Diagnosis Date   Anemia    Asthma    Blood transfusion without reported diagnosis    Gestational diabetes    Uterine fibroid     Past Surgical History:  Procedure Laterality Date   arm surgery     CERVICAL CERCLAGE     CESAREAN SECTION     DILATION AND CURETTAGE OF UTERUS     HYSTERECTOMY ABDOMINAL WITH SALPINGECTOMY Bilateral 10/08/2021   Procedure: ABDOMINAL HYSTERECTOMY WITH BILATERAL  SALPINGECTOMY;  Surgeon: Milas Hock, MD;  Location: [facility] OR;  Service: Gynecology;  Laterality: Bilateral;   IR RADIOLOGIST EVAL & MGMT  09/04/2021   TOOTH EXTRACTION       Current Outpatient Medications:    albuterol (VENTOLIN HFA) 108 (90 Base) MCG/ACT inhaler, Inhale 2 puffs into the lungs as needed for wheezing or shortness of breath., Disp: 1 each, Rfl: 1   Calcium Carb-Cholecalciferol (CALCIUM + D3 PO), Take 1 tablet by mouth daily. When able to remember, Disp: , Rfl:    cetirizine (ZYRTEC ALLERGY) 10 MG tablet, Take 1 tablet (10 mg total) by mouth daily., Disp: 90 tablet, Rfl: 1   clindamycin (CLINDAGEL) 1 % gel, Apply topically 2 (two) times daily., Disp: 30 g, Rfl: 1   Cyanocobalamin (B-12) 1000 MCG TBCR, Take 1,000 mcg by mouth daily. When able to remember, Disp: 100 tablet, Rfl: 3   escitalopram (LEXAPRO) 10 MG tablet, Take 1 tablet (10  mg total) by mouth daily., Disp: 30 tablet, Rfl: 1   fluticasone (FLONASE) 50 MCG/ACT nasal spray, Place 1 spray into both nostrils in the morning and at bedtime., Disp: 16 g, Rfl: 5   glucose blood (ACCU-CHEK GUIDE) test strip, daily, Disp: 100 each, Rfl: 3   glucose blood test strip, Use as instructed, Disp: 100 each, Rfl: 12   ibuprofen (ADVIL) 600 MG tablet, Take 1 tablet (600 mg total) by mouth every 6 (six) hours as needed for fever or headache., Disp: 30 tablet, Rfl: 0   Lancets (FREESTYLE) lancets, Use as instructed, Disp: 100 each, Rfl: 12   metFORMIN (GLUCOPHAGE) 500 MG tablet, Take 1 tablet (500 mg total) by mouth 2 (two) times daily with a meal., Disp: 180 tablet, Rfl: 1   ondansetron (ZOFRAN-ODT) 4 MG disintegrating tablet, Take 1 tablet (4 mg total) by mouth every 6 (six) hours as needed for nausea., Disp: 20 tablet, Rfl: 0  Allergies  Allergen Reactions   Shellfish Allergy Itching and Swelling    Throat swells    Other Itching and Swelling    Hazelnut. Swelling of throat    Latex Itching, Rash and Other (See Comments)    Burning    ROS neg/noncontributory except as noted HPI/below  Objective:     BP 120/78   Pulse 76   Temp 98.1 F (36.7 C) (Temporal)   Resp 16   Ht 5\' 4"  (1.626 m)   Wt 179 lb 8 oz (81.4 kg)   LMP 08/13/2021 (Approximate)   SpO2 99%   BMI 30.81 kg/m  Wt Readings from Last 3 Encounters:  06/26/22 179 lb 8 oz (81.4 kg)  05/11/22 182 lb 8 oz (82.8 kg)  04/10/22 181 lb 6 oz (82.3 kg)    Physical Exam   Gen: WDWN NAD HEENT: NCAT, conjunctiva not injected, sclera nonicteric EXT:  no edema.  Left lower extremity-some dilated veins shin.  On lateral side-about 2-3 cm patch discoloration-blue and some depth to it.  Near lateral maleolus, small patch.  Also, top of left foot. MSK: no gross abnormalities.  NEURO: A&O x3.  CN II-XII intact.  PSYCH: normal mood. Good eye contact     Assessment & Plan:  Venous insufficiency of left lower  extremity   Venous insufficiency left lower extremity-patches possibly venous lakes/confluance, other.  Wear compression stockings.  Patient having some itching.  Will refer to vascular  [name], MD

## 2022-06-26 NOTE — Patient Instructions (Signed)
It was very nice to see you today!  Wear compression stockings   PLEASE NOTE:  If you had any lab tests please let us know if you have not heard back within a few days. You may see your results on MyChart before we have a chance to review them but we will give you a call once they are reviewed by [place]. If we ordered any referrals today, please let us know if you have not heard from their office within the next week.   Please try these tips to maintain a healthy lifestyle:  Eat most of your calories during the day when you are active. Eliminate processed foods including packaged sweets (pies, cakes, cookies), reduce intake of potatoes, white bread, white pasta, and white rice. Look for whole grain options, oat flour or almond flour.  Each meal should contain half fruits/vegetables, one quarter protein, and one quarter carbs (no bigger than a computer mouse).  Cut down on sweet beverages. This includes juice, soda, and sweet tea. Also watch fruit intake, though this is a healthier sweet option, it still contains natural sugar! Limit to 3 servings daily.  Drink at least 1 glass of water with each meal and aim for at least 8 glasses per day  Exercise at least 150 minutes every week.

## 2022-06-27 LAB — COLOGUARD: COLOGUARD: NEGATIVE

## 2022-08-11 ENCOUNTER — Encounter: Payer: Self-pay | Admitting: Family Medicine

## 2022-08-11 ENCOUNTER — Ambulatory Visit: Payer: Medicaid Other | Admitting: Family Medicine

## 2022-08-11 VITALS — BP 118/84 | HR 55 | Temp 97.8°F | Ht 64.0 in | Wt 177.0 lb

## 2022-08-11 DIAGNOSIS — D500 Iron deficiency anemia secondary to blood loss (chronic): Secondary | ICD-10-CM

## 2022-08-11 DIAGNOSIS — E1165 Type 2 diabetes mellitus with hyperglycemia: Secondary | ICD-10-CM | POA: Diagnosis not present

## 2022-08-11 LAB — COMPREHENSIVE METABOLIC PANEL
ALT: 11 U/L (ref 0–35)
AST: 15 U/L (ref 0–37)
Albumin: 4.1 g/dL (ref 3.5–5.2)
Alkaline Phosphatase: 49 U/L (ref 39–117)
BUN: 9 mg/dL (ref 6–23)
CO2: 31 mEq/L (ref 19–32)
Calcium: 9.6 mg/dL (ref 8.4–10.5)
Chloride: 103 mEq/L (ref 96–112)
Creatinine, Ser: 0.62 mg/dL (ref 0.40–1.20)
GFR: 105.02 mL/min (ref >60.00)
Glucose, Bld: 90 mg/dL (ref 70–99)
Potassium: 3.9 mEq/L (ref 3.5–5.1)
Sodium: 140 mEq/L (ref 135–145)
Total Bilirubin: 1.5 mg/dL — ABNORMAL HIGH (ref 0.2–1.2)
Total Protein: 6.6 g/dL (ref 6.0–8.3)

## 2022-08-11 LAB — CBC WITH DIFFERENTIAL/PLATELET
Basophils Absolute: 0.1 10*3/uL (ref 0.0–0.1)
Basophils Relative: 1.4 % (ref 0.0–3.0)
Eosinophils Absolute: 0.1 10*3/uL (ref 0.0–0.7)
Eosinophils Relative: 3.1 % (ref 0.0–5.0)
HCT: 39.7 % (ref 36.0–46.0)
Hemoglobin: 13.3 g/dL (ref 12.0–15.0)
Lymphocytes Relative: 45.7 % (ref 12.0–46.0)
Lymphs Abs: 1.7 10*3/uL (ref 0.7–4.0)
MCHC: 33.4 g/dL (ref 30.0–36.0)
MCV: 87.4 fl (ref 78.0–100.0)
Monocytes Absolute: 0.3 10*3/uL (ref 0.1–1.0)
Monocytes Relative: 7.6 % (ref 3.0–12.0)
Neutro Abs: 1.5 10*3/uL (ref 1.4–7.7)
Neutrophils Relative %: 42.2 % — ABNORMAL LOW (ref 43.0–77.0)
Platelets: 203 10*3/uL (ref 150.0–400.0)
RBC: 4.55 Mil/uL (ref 3.87–5.11)
RDW: 13.9 % (ref 11.5–15.5)
WBC: 3.7 10*3/uL — ABNORMAL LOW (ref 4.0–10.5)

## 2022-08-11 LAB — HEMOGLOBIN A1C: Hgb A1c MFr Bld: 6.5 % (ref 4.6–6.5)

## 2022-08-11 NOTE — Patient Instructions (Signed)
It was very nice to see you today!  Keep up the great work!   PLEASE NOTE:  If you had any lab tests please let us know if you have not heard back within a few days. You may see your results on MyChart before we have a chance to review them but we will give you a call once they are reviewed by us. If we ordered any referrals today, please let us know if you have not heard from their office within the next week.   Please try these tips to maintain a healthy lifestyle:  Eat most of your calories during the day when you are active. Eliminate processed foods including packaged sweets (pies, cakes, cookies), reduce intake of potatoes, white bread, white pasta, and white rice. Look for whole grain options, oat flour or almond flour.  Each meal should contain half fruits/vegetables, one quarter protein, and one quarter carbs (no bigger than a computer mouse).  Cut down on sweet beverages. This includes juice, soda, and sweet tea. Also watch fruit intake, though this is a healthier sweet option, it still contains natural sugar! Limit to 3 servings daily.  Drink at least 1 glass of water with each meal and aim for at least 8 glasses per day  Exercise at least 150 minutes every week.   

## 2022-08-11 NOTE — Progress Notes (Signed)
Subjective:     Patient ID: [name], female    DOB: [birth year], 47 y.o.   MRN: [id number]  Chief Complaint  Patient presents with   2 month follow-up    HPI.  DM - She has stopped taking her Metformin about 2 months ago. She has been checking her blood sugars at least once a week, tends to stay <100. Has been trying to maintain a healthy diet. Exercise - Goes to the gym about about 2-3x a week, also goes on walks on days in between Chest pain - She reports an isolated episode of chest pain while exercising. She believes it was due to overexertion-heavy weight and moving arms and got pain that resolved when stopped lifted Anemia-from fibroids.  Had hyst.  Levels back up so stopped iron.  No issues.   Health Maintenance Due  Topic Date Due   DTaP/Tdap/Td (1 - Tdap) 10/18/1992    Past Medical History:  Diagnosis Date   Anemia    Asthma    Blood transfusion without reported diagnosis    Gestational diabetes    Uterine fibroid     Past Surgical History:  Procedure Laterality Date   arm surgery     CERVICAL CERCLAGE     CESAREAN SECTION     DILATION AND CURETTAGE OF UTERUS     HYSTERECTOMY ABDOMINAL WITH SALPINGECTOMY Bilateral 10/08/2021   Procedure: ABDOMINAL HYSTERECTOMY WITH BILATERAL  SALPINGECTOMY;  Surgeon: Milas Hock, MD;  Location: [facility] OR;  Service: Gynecology;  Laterality: Bilateral;   IR RADIOLOGIST EVAL & MGMT  09/04/2021   TOOTH EXTRACTION       Current Outpatient Medications:    albuterol (VENTOLIN HFA) 108 (90 Base) MCG/ACT inhaler, Inhale 2 puffs into the lungs as needed for wheezing or shortness of breath., Disp: 1 each, Rfl: 1   Calcium Carb-Cholecalciferol (CALCIUM + D3 PO), Take 1 tablet by mouth daily. When able to remember, Disp: , Rfl:    cetirizine (ZYRTEC ALLERGY) 10 MG tablet, Take 1 tablet (10 mg total) by mouth daily., Disp: 90 tablet, Rfl: 1   clindamycin (CLINDAGEL) 1 % gel, Apply topically 2 (two) times daily., Disp: 30 g, Rfl: 1    Cyanocobalamin (B-12) 1000 MCG TBCR, Take 1,000 mcg by mouth daily. When able to remember, Disp: 100 tablet, Rfl: 3   fluticasone (FLONASE) 50 MCG/ACT nasal spray, Place 1 spray into both nostrils in the morning and at bedtime., Disp: 16 g, Rfl: 5   glucose blood (ACCU-CHEK GUIDE) test strip, daily, Disp: 100 each, Rfl: 3   glucose blood test strip, Use as instructed, Disp: 100 each, Rfl: 12   ibuprofen (ADVIL) 600 MG tablet, Take 1 tablet (600 mg total) by mouth every 6 (six) hours as needed for fever or headache., Disp: 30 tablet, Rfl: 0   Lancets (FREESTYLE) lancets, Use as instructed, Disp: 100 each, Rfl: 12   ondansetron (ZOFRAN-ODT) 4 MG disintegrating tablet, Take 1 tablet (4 mg total) by mouth every 6 (six) hours as needed for nausea., Disp: 20 tablet, Rfl: 0  Allergies  Allergen Reactions   Shellfish Allergy Itching and Swelling    Throat swells    Other Itching and Swelling    Hazelnut. Swelling of throat    Latex Itching, Rash and Other (See Comments)    Burning    ROS neg/noncontributory except as noted HPI/below      Objective:     BP 118/84 (BP Location: Left Arm, Patient Position: Sitting)   Pulse Marland Kitchen)  55   Temp 97.8 F (36.6 C) (Temporal)   Ht 5\' 4"  (1.626 m)   Wt 177 lb (80.3 kg)   LMP 08/13/2021 (Approximate)   SpO2 99%   BMI 30.38 kg/m  Wt Readings from Last 3 Encounters:  08/11/22 177 lb (80.3 kg)  06/26/22 179 lb 8 oz (81.4 kg)  05/11/22 182 lb 8 oz (82.8 kg)    Physical Exam  Gen: WDWN NAD HEENT: NCAT, conjunctiva not injected, sclera nonicteric NECK:  supple, no thyromegaly, no nodes, no carotid bruits CARDIAC: RRR, S1S2+, no murmur. DP 2+B LUNGS: CTAB. No wheezes ABDOMEN:  BS+, soft, NTND, No HSM, no masses EXT:  no edema MSK: no gross abnormalities.  NEURO: A&O x3.  CN II-XII intact.  PSYCH: normal mood. Good eye contact      Assessment & Plan:  Type 2 diabetes mellitus with hyperglycemia, without long-term current use of insulin (HCC) -      Comprehensive metabolic panel -     Hemoglobin A1c  Iron deficiency anemia due to chronic blood loss -     CBC with Differential/Platelet  1.  Type 2 diabetes-chronic.  Well-controlled.  Off all of medications.  Working on diet/exercise.  Check labs 2.  Iron deficiency anemia due to blood loss from uterus.  Has had a hysterectomy.  Iron levels have normalized.  Patient is off iron.  Will check CBC  Return in about 6 months (around 02/11/2023) for DM.  I,[name],acting as a scribe for [name], MD.,have documented all relevant documentation on the behalf of [name], MD,as directed by  [name], MD while in the presence of [name], MD.  I, [name], MD, have reviewed all documentation for this visit. The documentation on 08/11/22 for the exam, diagnosis, procedures, and orders are all accurate and complete.   [name], MD

## 2022-08-11 NOTE — Progress Notes (Signed)
D/w pt.  Awesome job!  Keep up the great work!

## 2022-08-20 ENCOUNTER — Encounter: Payer: Self-pay | Admitting: Family Medicine

## 2022-08-25 ENCOUNTER — Ambulatory Visit (INDEPENDENT_AMBULATORY_CARE_PROVIDER_SITE_OTHER): Payer: Medicaid Other

## 2022-08-25 ENCOUNTER — Ambulatory Visit
Admission: EM | Admit: 2022-08-25 | Discharge: 2022-08-25 | Disposition: A | Discharge status: Home / Self Care | Payer: Medicaid Other | Attending: Internal Medicine | Admitting: Internal Medicine

## 2022-08-25 DIAGNOSIS — M79674 Pain in right toe(s): Secondary | ICD-10-CM

## 2022-08-25 DIAGNOSIS — S90121A Contusion of right lesser toe(s) without damage to nail, initial encounter: Secondary | ICD-10-CM

## 2022-08-25 NOTE — Discharge Instructions (Signed)
Your x-ray was normal.  Suspect that you have bruised your toe.  Elevate and apply ice.  Follow-up with podiatry if symptoms persist or worsen.

## 2022-08-25 NOTE — ED Provider Notes (Signed)
[facility]    CSN: [id number] Arrival date & time: 08/25/22  1747      History   Chief Complaint Chief Complaint  Patient presents with   Toe Injury    HPI [name] is a 47 y.o. female.   Patient presents with right fifth toe injury that occurred today.  Patient reports that she dropped a glass bottle on her toe.  Denies numbness or tingling.  She has not taken any medication for pain.     Past Medical History:  Diagnosis Date   Anemia    Asthma    Blood transfusion without reported diagnosis    Gestational diabetes    Type 2 diabetes mellitus (HCC)    Uterine fibroid     Patient Active Problem List   Diagnosis Date Noted   Type 2 diabetes mellitus with hyperglycemia, without long-term current use of insulin (HCC) 12/22/2021   Postoperative anemia due to acute blood loss 10/09/2021   Allergic rhinitis 07/04/2021   Mixed anxiety and depressive disorder 06/06/2021   Vitamin D deficiency 07/05/2020   Iron deficiency anemia due to chronic blood loss 06/12/2019   Menorrhagia with regular cycle 06/12/2019    Past Surgical History:  Procedure Laterality Date   arm surgery     CERVICAL CERCLAGE     CESAREAN SECTION     DILATION AND CURETTAGE OF UTERUS     HYSTERECTOMY ABDOMINAL WITH SALPINGECTOMY Bilateral 10/08/2021   Procedure: ABDOMINAL HYSTERECTOMY WITH BILATERAL  SALPINGECTOMY;  Surgeon: Milas Hock, MD;  Location: [facility] OR;  Service: Gynecology;  Laterality: Bilateral;   IR RADIOLOGIST EVAL & MGMT  09/04/2021   TOOTH EXTRACTION      OB History     Gravida  4   Para  3   Term  2   Preterm  1   AB  1   Living  2      SAB  1   IAB      Ectopic      Multiple  1   Live Births  3            Home Medications    Prior to Admission medications   Medication Sig Start Date End Date Taking? Authorizing Provider  Calcium Carb-Cholecalciferol (CALCIUM + D3 PO) Take 1 tablet by mouth daily. When able to remember   Yes  [provider]  cetirizine (ZYRTEC ALLERGY) 10 MG tablet Take 1 tablet (10 mg total) by mouth daily. 04/10/22  Yes Jeani Sow, MD  Cyanocobalamin (B-12) 1000 MCG TBCR Take 1,000 mcg by mouth daily. When able to remember 03/12/22  Yes Jeani Sow, MD  fluticasone [facility]) 50 MCG/ACT nasal spray Place 1 spray into both nostrils in the morning and at bedtime. 03/12/22  Yes Jeani Sow, MD  albuterol (VENTOLIN HFA) 108 (90 Base) MCG/ACT inhaler Inhale 2 puffs into the lungs as needed for wheezing or shortness of breath. 03/12/22   Jeani Sow, MD  clindamycin (CLINDAGEL) 1 % gel Apply topically 2 (two) times daily. 12/05/21   Jeani Sow, MD  glucose blood (ACCU-CHEK GUIDE) test strip daily 04/10/22   Jeani Sow, MD  glucose blood test strip Use as instructed 12/06/21   Terrilee Files, MD  ibuprofen (ADVIL) 600 MG tablet Take 1 tablet (600 mg total) by mouth every 6 (six) hours as needed for fever or headache. 10/10/21   Milas Hock, MD  Lancets (FREESTYLE) lancets Use as instructed 12/06/21  Terrilee Files, MD  ondansetron (ZOFRAN-ODT) 4 MG disintegrating tablet Take 1 tablet (4 mg total) by mouth every 6 (six) hours as needed for nausea. 10/10/21   Milas Hock, MD    Family History Family History  Problem Relation Age of Onset   Hypertension Mother    Hypertension Brother    Breast cancer Maternal Aunt    Hypertension Maternal Uncle     Social History Social History   Tobacco Use   Smoking status: Never   Smokeless tobacco: Never  Vaping Use   Vaping status: Never Used  Substance Use Topics   Alcohol use: No   Drug use: No     Allergies   Shellfish allergy, Other, and Latex   Review of Systems Review of Systems Per HPI  Physical Exam Triage Vital Signs ED Triage Vitals  Encounter Vitals Group     BP 08/25/22 1810 (!) 144/87     Systolic BP Percentile --      Diastolic BP Percentile --      Pulse Rate 08/25/22 1810 67     Resp  08/25/22 1810 16     Temp 08/25/22 1810 98.6 F (37 C)     Temp Source 08/25/22 1810 Oral     SpO2 08/25/22 1810 98 %     Weight 08/25/22 1810 171 lb (77.6 kg)     Height 08/25/22 1810 5\' 4"  (1.626 m)     Head Circumference --      Peak Flow --      Pain Score 08/25/22 1812 4     Pain Loc --      Pain Education --      Exclude from Growth Chart --    No data found.  Updated Vital Signs BP (!) 144/87 (BP Location: Left Arm)   Pulse 67   Temp 98.6 F (37 C) (Oral)   Resp 16   Ht 5\' 4"  (1.626 m)   Wt 171 lb (77.6 kg)   LMP 08/13/2021 (Approximate)   SpO2 98%   BMI 29.35 kg/m   Visual Acuity Right Eye Distance:   Left Eye Distance:   Bilateral Distance:    Right Eye Near:   Left Eye Near:    Bilateral Near:     Physical Exam Constitutional:      General: She is not in acute distress.    Appearance: Normal appearance. She is not toxic-appearing or diaphoretic.  HENT:     Head: Normocephalic and atraumatic.  Eyes:     Extraocular Movements: Extraocular movements intact.     Conjunctiva/sclera: Conjunctivae normal.  Pulmonary:     Effort: Pulmonary effort is normal.  Feet:     Comments: Patient has bruising discoloration and mild swelling present throughout the right fifth digit of the right foot. Nail is intact. No subungual hematoma noted.  No obvious abrasions or lacerations noted.  No other tenderness to remainder of foot or toes.  Patient can wiggle toes.  Appears neurovascularly intact. Neurological:     General: No focal deficit present.     Mental Status: She is alert and oriented to person, place, and time. Mental status is at baseline.  Psychiatric:        Mood and Affect: Mood normal.        Behavior: Behavior normal.        Thought Content: Thought content normal.        Judgment: Judgment normal.      UC Treatments / Results  Labs (all labs ordered are listed, but only abnormal results are displayed) Labs Reviewed - No data to  display  EKG   Radiology DG Toe 5th Right  Result Date: 08/25/2022 CLINICAL DATA:  Pain. Trauma. Bruise. Glass bottle dropped onto toe. Bruising and swelling. EXAM: RIGHT FIFTH TOE COMPARISON:  None Available. FINDINGS: The fifth toe DIP joint is fused, likely the patient's normal anatomy. No definite acute fracture is seen. No curvilinear lucency is seen on frontal or oblique view. On lateral view there is subtle curvilinear lucency overlying the distal aspect of the fused middle and distal phalanges, favored to represent the normal bone contours and overlapping bones. Normal alignment. Joint spaces are preserved. IMPRESSION: No definite acute fracture is seen.  Please see above discussion. Electronically Signed   By: Neita Garnet M.D.   On: 08/25/2022 19:03    Procedures Procedures (including critical care time)  Medications Ordered in UC Medications - No data to display  Initial Impression / Assessment and Plan / UC Course  I have reviewed the triage vital signs and the nursing notes.  Pertinent labs & imaging results that were available during my care of the patient were reviewed by me and considered in my medical decision making (see chart for details).     X-rays negative for any acute bony abnormality/fracture.  Suspect contusion related to mechanism of injury.  Advised supportive care including elevation and ice application. Advised to not apply ice directly to skin. Advised to take safe over-the-counter pain relievers as needed.  Advised following up with podiatrist at provided contact information if symptoms persist or worsen.  Patient verbalized understanding and was agreeable with plan. Final Clinical Impressions(s) / UC Diagnoses   Final diagnoses:  Contusion of lesser toe of right foot without damage to nail, initial encounter  Pain in right toe(s)     Discharge Instructions      Your x-ray was normal.  Suspect that you have bruised your toe.  Elevate and apply ice.   Follow-up with podiatry if symptoms persist or worsen.     ED Prescriptions   None    I have reviewed the PDMP during this encounter.   Gustavus Bryant, [place] 08/25/22 [id number]

## 2022-08-25 NOTE — ED Triage Notes (Signed)
Patient here today with c/o a right little toe injury today. A glass bottle dropped onto her toe. She has some bruising and swelling.

## 2022-11-10 ENCOUNTER — Ambulatory Visit (INDEPENDENT_AMBULATORY_CARE_PROVIDER_SITE_OTHER): Payer: Medicaid Other | Admitting: Family Medicine

## 2022-11-10 ENCOUNTER — Encounter: Payer: Self-pay | Admitting: Family Medicine

## 2022-11-10 VITALS — BP 117/76 | HR 72 | Temp 98.2°F | Resp 18 | Ht 64.0 in | Wt 172.4 lb

## 2022-11-10 DIAGNOSIS — R41840 Attention and concentration deficit: Secondary | ICD-10-CM

## 2022-11-10 DIAGNOSIS — J4520 Mild intermittent asthma, uncomplicated: Secondary | ICD-10-CM | POA: Diagnosis not present

## 2022-11-10 DIAGNOSIS — J309 Allergic rhinitis, unspecified: Secondary | ICD-10-CM | POA: Diagnosis not present

## 2022-11-10 MED ORDER — AMPHETAMINE-DEXTROAMPHETAMINE 5 MG PO TABS
5.0000 mg | ORAL_TABLET | Freq: Two times a day (BID) | ORAL | 0 refills | Status: DC
Start: 2022-11-10 — End: 2023-01-26

## 2022-11-10 MED ORDER — ALBUTEROL SULFATE HFA 108 (90 BASE) MCG/ACT IN AERS
2.0000 | INHALATION_SPRAY | RESPIRATORY_TRACT | 1 refills | Status: DC | PRN
Start: 2022-11-10 — End: 2022-11-10

## 2022-11-10 MED ORDER — FLUTICASONE PROPIONATE 50 MCG/ACT NA SUSP
1.0000 | Freq: Two times a day (BID) | NASAL | 5 refills | Status: DC
Start: 2022-11-10 — End: 2023-01-26

## 2022-11-10 MED ORDER — ALBUTEROL SULFATE HFA 108 (90 BASE) MCG/ACT IN AERS
2.0000 | INHALATION_SPRAY | Freq: Four times a day (QID) | RESPIRATORY_TRACT | 1 refills | Status: DC | PRN
Start: 2022-11-10 — End: 2023-04-22

## 2022-11-10 NOTE — Progress Notes (Signed)
Subjective:     Patient ID: [name], female    DOB: 01-06-1976, 47 y.o.   MRN: [id number]  Chief Complaint  Patient presents with   Discuss Medication    Discuss getting medication to help focus in school    HPI  Allergies - She reports needing a refill on her flonase. States her allergies flare up more with weather changes.   Asthma - Uses her albuterol inhaler as needed. Sometimes using once a week, sometimes once a month. She tends to need it most when walking at a fast pace, up the steps, or on a steep hill. States it also depends on the weather. Just likes having extra on hand.  Difficulty focusing - She reports recent increased difficulty focusing when reading and studying. She is enrolled in [employer], site classes Mon-Thurs, online and in-person. Struggling with focusing when reading long paragraphs and dealing with large amounts of information. Having difficulty understanding and has to often reread the information. Often procrastinates with assignments. She reports struggling with this her whole life, but was never formally tested. Previously she would have to go back over the material, but not as often as she has to now where she feels "all over the place". Reports certain subjects have also been more difficult for her to focus on. Has not previously taken any medication. No SI.  Vision - She has been using her mom's readers, but states she plans to schedule an appointment with her eye doctor soon. Previously did not need glasses.   DM - Has been working on maintaining a healthy diet and not skipping meals. Tries to keep snacks and drinks with her.    Health Maintenance Due  Topic Date Due   DTaP/Tdap/Td (1 - Tdap) 10/18/1992    Past Medical History:  Diagnosis Date   Anemia    Asthma    Blood transfusion without reported diagnosis    Gestational diabetes    Type 2 diabetes mellitus (HCC)    Uterine fibroid     Past Surgical History:  Procedure  Laterality Date   arm surgery     CERVICAL CERCLAGE     CESAREAN SECTION     DILATION AND CURETTAGE OF UTERUS     HYSTERECTOMY ABDOMINAL WITH SALPINGECTOMY Bilateral 10/08/2021   Procedure: ABDOMINAL HYSTERECTOMY WITH BILATERAL  SALPINGECTOMY;  Surgeon: Milas Hock, MD;  Location: [facility] OR;  Service: Gynecology;  Laterality: Bilateral;   IR RADIOLOGIST EVAL & MGMT  09/04/2021   TOOTH EXTRACTION       Current Outpatient Medications:    amphetamine-dextroamphetamine (ADDERALL) 5 MG tablet, Take 1 tablet (5 mg total) by mouth in the morning and at bedtime., Disp: 60 tablet, Rfl: 0   Cyanocobalamin (B-12) 1000 MCG TBCR, Take 1,000 mcg by mouth daily. When able to remember, Disp: 100 tablet, Rfl: 3   albuterol (VENTOLIN HFA) 108 (90 Base) MCG/ACT inhaler, Inhale 2 puffs into the lungs every 6 (six) hours as needed for wheezing or shortness of breath., Disp: 1 each, Rfl: 1   Calcium Carb-Cholecalciferol (CALCIUM + D3 PO), Take 1 tablet by mouth daily. When able to remember (Patient not taking: Reported on 11/10/2022), Disp: , Rfl:    cetirizine (ZYRTEC ALLERGY) 10 MG tablet, Take 1 tablet (10 mg total) by mouth daily. (Patient not taking: Reported on 11/10/2022), Disp: 90 tablet, Rfl: 1   clindamycin (CLINDAGEL) 1 % gel, Apply topically 2 (two) times daily. (Patient not taking: Reported on 11/10/2022), Disp: 30 g, Rfl:  1   fluticasone (FLONASE) 50 MCG/ACT nasal spray, Place 1 spray into both nostrils in the morning and at bedtime., Disp: 16 g, Rfl: 5   glucose blood (ACCU-CHEK GUIDE) test strip, daily (Patient not taking: Reported on 11/10/2022), Disp: 100 each, Rfl: 3   glucose blood test strip, Use as instructed (Patient not taking: Reported on 11/10/2022), Disp: 100 each, Rfl: 12   ibuprofen (ADVIL) 600 MG tablet, Take 1 tablet (600 mg total) by mouth every 6 (six) hours as needed for fever or headache. (Patient not taking: Reported on 11/10/2022), Disp: 30 tablet, Rfl: 0   Lancets (FREESTYLE) lancets, Use  as instructed (Patient not taking: Reported on 11/10/2022), Disp: 100 each, Rfl: 12   ondansetron (ZOFRAN-ODT) 4 MG disintegrating tablet, Take 1 tablet (4 mg total) by mouth every 6 (six) hours as needed for nausea. (Patient not taking: Reported on 11/10/2022), Disp: 20 tablet, Rfl: 0  Allergies  Allergen Reactions   Shellfish Allergy Itching and Swelling    Throat swells    Other Itching and Swelling    Hazelnut. Swelling of throat    Latex Itching, Rash and Other (See Comments)    Burning    ROS neg/noncontributory except as noted HPI/below      Objective:     BP 117/76   Pulse 72   Temp 98.2 F (36.8 C) (Temporal)   Resp 18   Ht 5\' 4"  (1.626 m)   Wt 172 lb 6 oz (78.2 kg)   LMP 08/13/2021 (Approximate)   SpO2 98%   BMI 29.59 kg/m  Wt Readings from Last 3 Encounters:  11/10/22 172 lb 6 oz (78.2 kg)  08/25/22 171 lb (77.6 kg)  08/11/22 177 lb (80.3 kg)    Physical Exam   Gen: WDWN NAD HEENT: NCAT, conjunctiva not injected, sclera nonicteric CARDIAC: RRR, S1S2+, no murmur.  LUNGS: CTAB. No wheezes MSK: no gross abnormalities.  NEURO: A&O x3.  CN II-XII intact.  PSYCH: normal mood. Good eye contact  PDMP reviewed today, no red flags.     Assessment & Plan:  Mild intermittent asthma without complication Assessment & Plan: Chronic.  Controlled.  Renewed albuterol 2 puffs q 6h prn  Orders: -     Albuterol Sulfate HFA; Inhale 2 puffs into the lungs every 6 (six) hours as needed for wheezing or shortness of breath.  Dispense: 1 each; Refill: 1  Allergic rhinitis, unspecified seasonality, unspecified trigger Assessment & Plan: Chronic,.  Seasonal and Fall is one.  Refilled flonase 1 spray each nostril bid  Orders: -     Fluticasone Propionate; Place 1 spray into both nostrils in the morning and at bedtime.  Dispense: 16 g; Refill: 5  Attention or concentration deficit -     Amphetamine-Dextroamphetamine; Take 1 tablet (5 mg total) by mouth in the morning and at  bedtime.  Dispense: 60 tablet; Refill: 0  ADD-pt late so couldn't do in depth eval.  Enough symptoms.  Want her to be successful in classes.  Study habits discussed.  Will start adderall 5mg  bid-SED.  Consider long acting once figure out dosing and if works and how does w/it.  Return in about 3 weeks (around 12/01/2022) for add.  I,Rachel Rivera,acting as a scribe for [name], MD.,have documented all relevant documentation on the behalf of [name], MD,as directed by  [name], MD while in the presence of [name], MD.  I, [name], MD, have reviewed all documentation for this  visit. The documentation on 11/10/22 for the exam, diagnosis, procedures, and orders are all accurate and complete.     [name], MD

## 2022-11-10 NOTE — Patient Instructions (Signed)

## 2022-11-10 NOTE — Assessment & Plan Note (Signed)
Chronic,.  Seasonal and Fall is one.  Refilled flonase 1 spray each nostril bid

## 2022-11-10 NOTE — Assessment & Plan Note (Signed)
Chronic.  Controlled.  Renewed albuterol 2 puffs q 6h prn

## 2022-11-11 ENCOUNTER — Other Ambulatory Visit: Payer: Medicaid Other

## 2022-11-11 ENCOUNTER — Other Ambulatory Visit: Payer: Self-pay | Admitting: *Deleted

## 2022-11-11 DIAGNOSIS — Z111 Encounter for screening for respiratory tuberculosis: Secondary | ICD-10-CM

## 2022-11-14 LAB — QUANTIFERON-TB GOLD PLUS
Mitogen-NIL: >10 [IU]/mL
NIL: 0.03 [IU]/mL
QuantiFERON-TB Gold Plus: NEGATIVE
TB1-NIL: 0.01 [IU]/mL
TB2-NIL: 0 [IU]/mL

## 2023-01-26 ENCOUNTER — Encounter: Payer: Self-pay | Admitting: Family

## 2023-01-26 ENCOUNTER — Ambulatory Visit: Payer: Medicaid Other | Admitting: Family Medicine

## 2023-01-26 ENCOUNTER — Encounter: Payer: Self-pay | Admitting: Family Medicine

## 2023-01-26 ENCOUNTER — Other Ambulatory Visit (HOSPITAL_BASED_OUTPATIENT_CLINIC_OR_DEPARTMENT_OTHER): Payer: Self-pay

## 2023-01-26 VITALS — BP 115/75 | HR 66 | Temp 97.9°F | Resp 18 | Ht 64.0 in | Wt 169.5 lb

## 2023-01-26 DIAGNOSIS — R41840 Attention and concentration deficit: Secondary | ICD-10-CM | POA: Insufficient documentation

## 2023-01-26 DIAGNOSIS — E1165 Type 2 diabetes mellitus with hyperglycemia: Secondary | ICD-10-CM

## 2023-01-26 DIAGNOSIS — J309 Allergic rhinitis, unspecified: Secondary | ICD-10-CM | POA: Diagnosis not present

## 2023-01-26 DIAGNOSIS — J4520 Mild intermittent asthma, uncomplicated: Secondary | ICD-10-CM | POA: Diagnosis not present

## 2023-01-26 LAB — CBC WITH DIFFERENTIAL/PLATELET
Basophils Absolute: 0.1 10*3/uL (ref 0.0–0.1)
Basophils Relative: 1.4 % (ref 0.0–3.0)
Eosinophils Absolute: 0.2 10*3/uL (ref 0.0–0.7)
Eosinophils Relative: 4.5 % (ref 0.0–5.0)
HCT: 40 % (ref 36.0–46.0)
Hemoglobin: 13.6 g/dL (ref 12.0–15.0)
Lymphocytes Relative: 41.9 % (ref 12.0–46.0)
Lymphs Abs: 1.6 10*3/uL (ref 0.7–4.0)
MCHC: 34 g/dL (ref 30.0–36.0)
MCV: 87.3 fL (ref 78.0–100.0)
Monocytes Absolute: 0.3 10*3/uL (ref 0.1–1.0)
Monocytes Relative: 7.6 % (ref 3.0–12.0)
Neutro Abs: 1.7 10*3/uL (ref 1.4–7.7)
Neutrophils Relative %: 44.6 % (ref 43.0–77.0)
Platelets: 186 10*3/uL (ref 150.0–400.0)
RBC: 4.59 Mil/uL (ref 3.87–5.11)
RDW: 13.5 % (ref 11.5–15.5)
WBC: 3.7 10*3/uL — ABNORMAL LOW (ref 4.0–10.5)

## 2023-01-26 LAB — LIPID PANEL
Cholesterol: 157 mg/dL (ref 0–200)
HDL: 44.9 mg/dL (ref >39.00)
LDL Cholesterol: 98 mg/dL (ref 0–99)
NonHDL: 111.77
Total CHOL/HDL Ratio: 3
Triglycerides: 70 mg/dL (ref 0.0–149.0)
VLDL: 14 mg/dL (ref 0.0–40.0)

## 2023-01-26 LAB — COMPREHENSIVE METABOLIC PANEL
ALT: 10 U/L (ref 0–35)
AST: 16 U/L (ref 0–37)
Albumin: 4.1 g/dL (ref 3.5–5.2)
Alkaline Phosphatase: 46 U/L (ref 39–117)
BUN: 14 mg/dL (ref 6–23)
CO2: 29 meq/L (ref 19–32)
Calcium: 8.9 mg/dL (ref 8.4–10.5)
Chloride: 104 meq/L (ref 96–112)
Creatinine, Ser: 0.62 mg/dL (ref 0.40–1.20)
GFR: 104.68 mL/min (ref >60.00)
Glucose, Bld: 123 mg/dL — ABNORMAL HIGH (ref 70–99)
Potassium: 3.6 meq/L (ref 3.5–5.1)
Sodium: 139 meq/L (ref 135–145)
Total Bilirubin: 1.2 mg/dL (ref 0.2–1.2)
Total Protein: 6.8 g/dL (ref 6.0–8.3)

## 2023-01-26 LAB — HEMOGLOBIN A1C: Hgb A1c MFr Bld: 7 % — ABNORMAL HIGH (ref 4.6–6.5)

## 2023-01-26 MED ORDER — CETIRIZINE HCL 10 MG PO TABS
10.0000 mg | ORAL_TABLET | Freq: Every day | ORAL | 1 refills | Status: AC
Start: 2023-01-26 — End: ?
  Filled 2023-01-26: qty 90, 90d supply, fill #0
  Filled 2023-09-01: qty 90, 90d supply, fill #1

## 2023-01-26 MED ORDER — AMPHETAMINE-DEXTROAMPHET ER 5 MG PO CP24
5.0000 mg | ORAL_CAPSULE | Freq: Every day | ORAL | 0 refills | Status: DC
Start: 2023-01-26 — End: 2023-09-02
  Filled 2023-01-26: qty 30, 30d supply, fill #0

## 2023-01-26 MED ORDER — FLUTICASONE PROPIONATE 50 MCG/ACT NA SUSP
1.0000 | Freq: Two times a day (BID) | NASAL | 5 refills | Status: AC
Start: 2023-01-26 — End: ?
  Filled 2023-01-26: qty 16, 30d supply, fill #0
  Filled 2023-09-01: qty 16, 30d supply, fill #1

## 2023-01-26 MED ORDER — CETIRIZINE HCL 10 MG PO TABS
10.0000 mg | ORAL_TABLET | Freq: Every day | ORAL | 1 refills | Status: DC
Start: 2023-01-26 — End: 2023-01-26

## 2023-01-26 NOTE — Assessment & Plan Note (Signed)
Chronic.  Controlled.  continue albuterol 2 puffs q 6h prn

## 2023-01-26 NOTE — Progress Notes (Signed)
Subjective:     Patient ID: [name], female    DOB: August 29, 1975, 47 y.o.   MRN: [id number]  Chief Complaint  Patient presents with   Medical Management of Chronic Issues    Follow-up for medication refills Discuss cream for face, check ears for possible infection    HPI-55min late   DM type 2 - Has been working on maintaining a healthy diet and not skipping meals. Tries to keep snacks and drinks with her.  Some walking.  Not feeling lows unless skips meal. Ophth >101yr  Asthma - Uses her albuterol inhaler as needed. Sometimes using once a week, sometimes once a month. She tends to need it most when walking at a fast pace, up the steps, or on a steep hill. States it also depends on the weather. Just likes having extra on hand.  Difficulty focusing - She reports recent increased difficulty focusing when reading and studying. She is enrolled in [employer], site classes Mon-Thurs, online and in-person. Struggling with focusing when reading long paragraphs and dealing with large amounts of information. Having difficulty understanding and has to often reread the information. Often procrastinates with assignments. She reports struggling with this her whole life, but was never formally tested. Previously she would have to go back over the material, but not as often as she has to now where she feels "all over the place". Reports certain subjects have also been more difficult for her to focus on. Has not previously taken any medication.  Tried adderall-was working well but then ran out and no f/u   no SE.  No SI. 4.  Allergies-needs refill on zyrtec.      Health Maintenance Due  Topic Date Due   DTaP/Tdap/Td (1 - Tdap) 10/18/1992   OPHTHALMOLOGY EXAM  12/19/2022    Past Medical History:  Diagnosis Date   Anemia    Asthma    Blood transfusion without reported diagnosis    Gestational diabetes    Type 2 diabetes mellitus (HCC)     Past Surgical History:  Procedure Laterality  Date   arm surgery     CERVICAL CERCLAGE     CESAREAN SECTION     DILATION AND CURETTAGE OF UTERUS     HYSTERECTOMY ABDOMINAL WITH SALPINGECTOMY Bilateral 10/08/2021   Procedure: ABDOMINAL HYSTERECTOMY WITH BILATERAL  SALPINGECTOMY;  Surgeon: Milas Hock, MD;  Location: [facility] OR;  Service: Gynecology;  Laterality: Bilateral;   IR RADIOLOGIST EVAL & MGMT  09/04/2021   TOOTH EXTRACTION       Current Outpatient Medications:    albuterol (VENTOLIN HFA) 108 (90 Base) MCG/ACT inhaler, Inhale 2 puffs into the lungs every 6 (six) hours as needed for wheezing or shortness of breath., Disp: 1 each, Rfl: 1   amphetamine-dextroamphetamine (ADDERALL XR) 5 MG 24 hr capsule, Take 1 capsule (5 mg total) by mouth daily., Disp: 30 capsule, Rfl: 0   Calcium Carb-Cholecalciferol (CALCIUM + D3 PO), Take 1 tablet by mouth daily. When able to remember (Patient not taking: Reported on 11/10/2022), Disp: , Rfl:    cetirizine (ZYRTEC ALLERGY) 10 MG tablet, Take 1 tablet (10 mg total) by mouth daily., Disp: 90 tablet, Rfl: 1   clindamycin (CLINDAGEL) 1 % gel, Apply topically 2 (two) times daily. (Patient not taking: Reported on 01/26/2023), Disp: 30 g, Rfl: 1   Cyanocobalamin (B-12) 1000 MCG TBCR, Take 1,000 mcg by mouth daily. When able to remember (Patient not taking: Reported on 01/26/2023), Disp: 100 tablet, Rfl: 3  fluticasone (FLONASE) 50 MCG/ACT nasal spray, Place 1 spray into both nostrils in the morning and at bedtime., Disp: 16 g, Rfl: 5   glucose blood (ACCU-CHEK GUIDE) test strip, daily (Patient not taking: Reported on 01/26/2023), Disp: 100 each, Rfl: 3   glucose blood test strip, Use as instructed (Patient not taking: Reported on 01/26/2023), Disp: 100 each, Rfl: 12   ibuprofen (ADVIL) 600 MG tablet, Take 1 tablet (600 mg total) by mouth every 6 (six) hours as needed for fever or headache. (Patient not taking: Reported on 01/26/2023), Disp: 30 tablet, Rfl: 0   Lancets (FREESTYLE) lancets, Use as instructed  (Patient not taking: Reported on 01/26/2023), Disp: 100 each, Rfl: 12   ondansetron (ZOFRAN-ODT) 4 MG disintegrating tablet, Take 1 tablet (4 mg total) by mouth every 6 (six) hours as needed for nausea. (Patient not taking: Reported on 01/26/2023), Disp: 20 tablet, Rfl: 0  Allergies  Allergen Reactions   Shellfish Allergy Itching and Swelling    Throat swells    Other Itching and Swelling    Hazelnut. Swelling of throat    Latex Itching, Rash and Other (See Comments)    Burning    ROS neg/noncontributory except as noted HPI/below      Objective:     BP 115/75   Pulse 66   Temp 97.9 F (36.6 C) (Temporal)   Resp 18   Ht 5\' 4"  (1.626 m)   Wt 169 lb 8 oz (76.9 kg)   LMP 08/13/2021 (Approximate)   SpO2 99%   BMI 29.09 kg/m  Wt Readings from Last 3 Encounters:  01/26/23 169 lb 8 oz (76.9 kg)  11/10/22 172 lb 6 oz (78.2 kg)  08/25/22 171 lb (77.6 kg)    Physical Exam   Gen: WDWN NAD HEENT: NCAT, conjunctiva not injected, sclera nonicteric CARDIAC: RRR, S1S2+, no murmur.  LUNGS: CTAB. No wheezes MSK: no gross abnormalities.  NEURO: A&O x3.  CN II-XII intact.  PSYCH: normal mood. Good eye contact  PDMP reviewed today, no red flags.     Assessment & Plan:  Type 2 diabetes mellitus with hyperglycemia, without long-term current use of insulin (HCC) Assessment & Plan: Chronic.  Controlled.  Diet only.  Check labs.  Get eye exam done  Orders: -     CBC with Differential/Platelet -     Comprehensive metabolic panel -     Hemoglobin A1c -     Lipid panel  Allergic rhinitis, unspecified seasonality, unspecified trigger Assessment & Plan: Chronic,.  Seasonal   controlled.  Continue zyrtec 10mg  daily and flonase 1 spray each nostril bid  Orders: -     Cetirizine HCl; Take 1 tablet (10 mg total) by mouth daily.  Dispense: 90 tablet; Refill: 1 -     Fluticasone Propionate; Place 1 spray into both nostrils in the morning and at bedtime.  Dispense: 16 g; Refill: 5  Mild  intermittent asthma without complication Assessment & Plan: Chronic.  Controlled.  continue albuterol 2 puffs q 6h prn   Attention or concentration deficit Assessment & Plan: Chronic.  Controlled w/adderall but didn't continue.  Will do adderall xr 5mg  daily to avoid bid dosing  Orders: -     Urine drugs of abuse scrn w alc, routine (Ref Lab) -     Amphetamine-Dextroamphet ER; Take 1 capsule (5 mg total) by mouth daily.  Dispense: 30 capsule; Refill: 0     Return in about 3 months (around 04/26/2023) for add.     Dewayne Hatch  Rockney Ghee, MD

## 2023-01-26 NOTE — Patient Instructions (Signed)
It was very nice to see you today!  Merry Christmas  Message in 1 month about the adderall   PLEASE NOTE:  If you had any lab tests please let us know if you have not heard back within a few days. You may see your results on MyChart before we have a chance to review them but we will give you a call once they are reviewed by [place]. If we ordered any referrals today, please let us know if you have not heard from their office within the next week.   Please try these tips to maintain a healthy lifestyle:  Eat most of your calories during the day when you are active. Eliminate processed foods including packaged sweets (pies, cakes, cookies), reduce intake of potatoes, white bread, white pasta, and white rice. Look for whole grain options, oat flour or almond flour.  Each meal should contain half fruits/vegetables, one quarter protein, and one quarter carbs (no bigger than a computer mouse).  Cut down on sweet beverages. This includes juice, soda, and sweet tea. Also watch fruit intake, though this is a healthier sweet option, it still contains natural sugar! Limit to 3 servings daily.  Drink at least 1 glass of water with each meal and aim for at least 8 glasses per day  Exercise at least 150 minutes every week.

## 2023-01-26 NOTE — Assessment & Plan Note (Signed)
Chronic.  Controlled w/adderall but didn't continue.  Will do adderall xr 5mg  daily to avoid bid dosing

## 2023-01-26 NOTE — Assessment & Plan Note (Signed)
Chronic.  Controlled.  Diet only.  Check labs.  Get eye exam done

## 2023-01-26 NOTE — Assessment & Plan Note (Signed)
Chronic,.  Seasonal   controlled.  Continue zyrtec 10mg  daily and flonase 1 spray each nostril bid

## 2023-01-27 ENCOUNTER — Encounter: Payer: Self-pay | Admitting: Family Medicine

## 2023-01-27 NOTE — Progress Notes (Signed)
Labs look ok except diabetes is getting out of control.  Does she want to work harder on diet alone and f/u 3 months or work on diet and restart metformin 500mg  bid and still sch f/u in 3 months

## 2023-01-28 LAB — URINE DRUGS OF ABUSE SCREEN W ALC, ROUTINE (REF LAB)
Amphetamines, Urine: NEGATIVE ng/mL
Barbiturate Quant, Ur: NEGATIVE ng/mL
Benzodiazepine Quant, Ur: NEGATIVE ng/mL
Cannabinoid Quant, Ur: NEGATIVE ng/mL
Cocaine (Metab.): NEGATIVE ng/mL
Ethanol, Urine: NEGATIVE %
Methadone Screen, Urine: NEGATIVE ng/mL
Opiate Quant, Ur: NEGATIVE ng/mL
PCP Quant, Ur: NEGATIVE ng/mL
Propoxyphene: NEGATIVE ng/mL

## 2023-02-01 ENCOUNTER — Other Ambulatory Visit (HOSPITAL_BASED_OUTPATIENT_CLINIC_OR_DEPARTMENT_OTHER): Payer: Self-pay

## 2023-02-01 ENCOUNTER — Ambulatory Visit: Payer: Medicaid Other | Admitting: Family Medicine

## 2023-02-01 ENCOUNTER — Encounter: Payer: Self-pay | Admitting: Family

## 2023-02-01 ENCOUNTER — Encounter: Payer: Self-pay | Admitting: Family Medicine

## 2023-02-01 VITALS — BP 140/73 | HR 79 | Temp 98.0°F | Ht 64.0 in | Wt 168.0 lb

## 2023-02-01 DIAGNOSIS — J101 Influenza due to other identified influenza virus with other respiratory manifestations: Secondary | ICD-10-CM

## 2023-02-01 DIAGNOSIS — J020 Streptococcal pharyngitis: Secondary | ICD-10-CM

## 2023-02-01 DIAGNOSIS — J029 Acute pharyngitis, unspecified: Secondary | ICD-10-CM | POA: Diagnosis not present

## 2023-02-01 DIAGNOSIS — R059 Cough, unspecified: Secondary | ICD-10-CM | POA: Diagnosis not present

## 2023-02-01 LAB — POCT INFLUENZA A/B
Influenza A, POC: POSITIVE — AB
Influenza B, POC: NEGATIVE

## 2023-02-01 LAB — POCT RAPID STREP A (OFFICE): Rapid Strep A Screen: POSITIVE — AB

## 2023-02-01 LAB — POC COVID19 BINAXNOW: SARS Coronavirus 2 Ag: NEGATIVE

## 2023-02-01 MED ORDER — AMOXICILLIN 500 MG PO CAPS
500.0000 mg | ORAL_CAPSULE | Freq: Three times a day (TID) | ORAL | 0 refills | Status: AC
  Filled 2023-02-01: qty 30, 10d supply, fill #0

## 2023-02-01 MED ORDER — GUAIFENESIN-CODEINE 100-10 MG/5ML PO SOLN
10.0000 mL | Freq: Four times a day (QID) | ORAL | 0 refills | Status: DC | PRN
  Filled 2023-02-01: qty 120, 3d supply, fill #0

## 2023-02-01 MED ORDER — OSELTAMIVIR PHOSPHATE 75 MG PO CAPS
75.0000 mg | ORAL_CAPSULE | Freq: Two times a day (BID) | ORAL | 0 refills | Status: DC
  Filled 2023-02-01: qty 10, 5d supply, fill #0

## 2023-02-01 NOTE — Progress Notes (Signed)
Subjective:     Patient ID: [name], female    DOB: 29-Jan-1976, 47 y.o.   MRN: [id number]  Chief Complaint  Patient presents with   Sore Throat    Pt c/o of sore throat since Thursday    Cough    Pt c/o of cough since Thursday, pt has tried hot liquids and cough drops that helped some    Nasal Congestion    Pt c/o of runny nose since Thursday     HPI Sore throat for 3 days.  Nasal congestion, cough, hot and cold.  Exposed to something on 12/26.  +ha. Feels bad.  No n/v/d  There are no preventive care reminders to display for this patient.  Past Medical History:  Diagnosis Date   Anemia    Asthma    Blood transfusion without reported diagnosis    Gestational diabetes    Type 2 diabetes mellitus (HCC)     Past Surgical History:  Procedure Laterality Date   arm surgery     CERVICAL CERCLAGE     CESAREAN SECTION     DILATION AND CURETTAGE OF UTERUS     HYSTERECTOMY ABDOMINAL WITH SALPINGECTOMY Bilateral 10/08/2021   Procedure: ABDOMINAL HYSTERECTOMY WITH BILATERAL  SALPINGECTOMY;  Surgeon: Milas Hock, MD;  Location: [facility] OR;  Service: Gynecology;  Laterality: Bilateral;   IR RADIOLOGIST EVAL & MGMT  09/04/2021   TOOTH EXTRACTION       Current Outpatient Medications:    albuterol (VENTOLIN HFA) 108 (90 Base) MCG/ACT inhaler, Inhale 2 puffs into the lungs every 6 (six) hours as needed for wheezing or shortness of breath., Disp: 1 each, Rfl: 1   amoxicillin (AMOXIL) 500 MG capsule, Take 1 capsule (500 mg total) by mouth 3 (three) times daily for 10 days., Disp: 30 capsule, Rfl: 0   amphetamine-dextroamphetamine (ADDERALL XR) 5 MG 24 hr capsule, Take 1 capsule (5 mg total) by mouth daily., Disp: 30 capsule, Rfl: 0   Calcium Carb-Cholecalciferol (CALCIUM + D3 PO), Take 1 tablet by mouth daily. When able to remember, Disp: , Rfl:    cetirizine (ZYRTEC ALLERGY) 10 MG tablet, Take 1 tablet (10 mg total) by mouth daily., Disp: 90 tablet, Rfl: 1   clindamycin (CLINDAGEL) 1 %  gel, Apply topically 2 (two) times daily., Disp: 30 g, Rfl: 1   Cyanocobalamin (B-12) 1000 MCG TBCR, Take 1,000 mcg by mouth daily. When able to remember, Disp: 100 tablet, Rfl: 3   fluticasone (FLONASE) 50 MCG/ACT nasal spray, Place 1 spray into both nostrils in the morning and at bedtime., Disp: 16 g, Rfl: 5   glucose blood (ACCU-CHEK GUIDE) test strip, daily, Disp: 100 each, Rfl: 3   glucose blood test strip, Use as instructed, Disp: 100 each, Rfl: 12   guaiFENesin-codeine 100-10 MG/5ML syrup, Take 10 mLs by mouth every 6 (six) hours as needed for cough., Disp: 120 mL, Rfl: 0   ibuprofen (ADVIL) 600 MG tablet, Take 1 tablet (600 mg total) by mouth every 6 (six) hours as needed for fever or headache., Disp: 30 tablet, Rfl: 0   Lancets (FREESTYLE) lancets, Use as instructed, Disp: 100 each, Rfl: 12   ondansetron (ZOFRAN-ODT) 4 MG disintegrating tablet, Take 1 tablet (4 mg total) by mouth every 6 (six) hours as needed for nausea., Disp: 20 tablet, Rfl: 0   oseltamivir (TAMIFLU) 75 MG capsule, Take 1 capsule (75 mg total) by mouth 2 (two) times daily., Disp: 10 capsule, Rfl: 0  Allergies  Allergen Reactions  Shellfish Allergy Itching and Swelling    Throat swells    Other Itching and Swelling    Hazelnut. Swelling of throat    Latex Itching, Rash and Other (See Comments)    Burning    ROS neg/noncontributory except as noted HPI/below      Objective:     BP (!) 140/73   Pulse 79   Temp 98 F (36.7 C)   Ht 5\' 4"  (1.626 m)   Wt 168 lb (76.2 kg)   LMP 08/13/2021 (Approximate)   SpO2 99%   BMI 28.84 kg/m  Wt Readings from Last 3 Encounters:  02/01/23 168 lb (76.2 kg)  01/26/23 169 lb 8 oz (76.9 kg)  11/10/22 172 lb 6 oz (78.2 kg)    Physical Exam   Gen: WDWN looks sick but nontoxic HEENT: NCAT, conjunctiva not injected, sclera nonicteric TM WNL B, OP moist, no exudates but red NECK:  supple, no thyromegaly, + tender, enlarged submand nodes B CARDIAC: RRR, S1S2+, no  murmur. LUNGS: CTAB. No wheezes EXT:  no edema MSK: no gross abnormalities.  NEURO: A&O x3.  CN II-XII intact.  PSYCH: normal mood. Good eye contact  Results for orders placed or performed in visit on 02/01/23  POC COVID-19 BinaxNow   Collection Time: 02/01/23  4:09 PM  Result Value Ref Range   SARS Coronavirus 2 Ag Negative Negative  POCT Influenza A/B   Collection Time: 02/01/23  4:12 PM  Result Value Ref Range   Influenza A, POC Positive (A) Negative   Influenza B, POC Negative Negative  POCT rapid strep A   Collection Time: 02/01/23  4:19 PM  Result Value Ref Range   Rapid Strep A Screen Positive (A) Negative        Assessment & Plan:  Influenza A  Strep pharyngitis  Cough, unspecified type -     POC COVID-19 BinaxNow -     POCT Influenza A/B  Sore throat -     POCT rapid strep A  Other orders -     Oseltamivir Phosphate; Take 1 capsule (75 mg total) by mouth 2 (two) times daily.  Dispense: 10 capsule; Refill: 0 -     Amoxicillin; Take 1 capsule (500 mg total) by mouth 3 (three) times daily for 10 days.  Dispense: 30 capsule; Refill: 0 -     guaiFENesin-Codeine; Take 10 mLs by mouth every 6 (six) hours as needed for cough.  Dispense: 120 mL; Refill: 0   Flu A-tylenol, tamiflu 75mg  bid. Robitussin AC Strep pharyngitis-amox 500tid  Return if symptoms worsen or fail to improve.  [name], MD

## 2023-02-01 NOTE — Patient Instructions (Signed)
It was very nice to see you today!  Rest, fluids, tylenol   PLEASE NOTE:  If you had any lab tests please let us know if you have not heard back within a few days. You may see your results on MyChart before we have a chance to review them but we will give you a call once they are reviewed by [place]. If we ordered any referrals today, please let us know if you have not heard from their office within the next week.   Please try these tips to maintain a healthy lifestyle:  Eat most of your calories during the day when you are active. Eliminate processed foods including packaged sweets (pies, cakes, cookies), reduce intake of potatoes, white bread, white pasta, and white rice. Look for whole grain options, oat flour or almond flour.  Each meal should contain half fruits/vegetables, one quarter protein, and one quarter carbs (no bigger than a computer mouse).  Cut down on sweet beverages. This includes juice, soda, and sweet tea. Also watch fruit intake, though this is a healthier sweet option, it still contains natural sugar! Limit to 3 servings daily.  Drink at least 1 glass of water with each meal and aim for at least 8 glasses per day  Exercise at least 150 minutes every week.

## 2023-02-16 ENCOUNTER — Telehealth: Payer: Self-pay | Admitting: Family Medicine

## 2023-02-16 NOTE — Telephone Encounter (Signed)
 Patient requesting TOC to [facility] . Please Advise

## 2023-02-17 NOTE — Telephone Encounter (Signed)
 Patient would like to know if she can transfer over to Dr.Morrison

## 2023-03-10 ENCOUNTER — Encounter: Payer: Medicaid Other | Admitting: Internal Medicine

## 2023-03-17 ENCOUNTER — Other Ambulatory Visit (HOSPITAL_BASED_OUTPATIENT_CLINIC_OR_DEPARTMENT_OTHER): Payer: Self-pay

## 2023-03-17 ENCOUNTER — Ambulatory Visit: Payer: Medicaid Other | Admitting: Family Medicine

## 2023-03-17 ENCOUNTER — Encounter: Payer: Self-pay | Admitting: Family Medicine

## 2023-03-17 VITALS — BP 125/81 | HR 73 | Temp 98.6°F | Resp 16 | Ht 64.0 in | Wt 171.2 lb

## 2023-03-17 DIAGNOSIS — E1165 Type 2 diabetes mellitus with hyperglycemia: Secondary | ICD-10-CM | POA: Diagnosis not present

## 2023-03-17 DIAGNOSIS — F418 Other specified anxiety disorders: Secondary | ICD-10-CM | POA: Diagnosis not present

## 2023-03-17 DIAGNOSIS — L700 Acne vulgaris: Secondary | ICD-10-CM

## 2023-03-17 MED ORDER — CLINDAMYCIN PHOSPHATE 1 % EX GEL
Freq: Two times a day (BID) | CUTANEOUS | 1 refills | Status: AC
  Filled 2023-03-17: qty 30, 30d supply, fill #0

## 2023-03-17 NOTE — Patient Instructions (Signed)
It was very nice to see you today!  Referral to behavioral health   PLEASE NOTE:  If you had any lab tests please let us know if you have not heard back within a few days. You may see your results on MyChart before we have a chance to review them but we will give you a call once they are reviewed by [place]. If we ordered any referrals today, please let us know if you have not heard from their office within the next week.   Please try these tips to maintain a healthy lifestyle:  Eat most of your calories during the day when you are active. Eliminate processed foods including packaged sweets (pies, cakes, cookies), reduce intake of potatoes, white bread, white pasta, and white rice. Look for whole grain options, oat flour or almond flour.  Each meal should contain half fruits/vegetables, one quarter protein, and one quarter carbs (no bigger than a computer mouse).  Cut down on sweet beverages. This includes juice, soda, and sweet tea. Also watch fruit intake, though this is a healthier sweet option, it still contains natural sugar! Limit to 3 servings daily.  Drink at least 1 glass of water with each meal and aim for at least 8 glasses per day  Exercise at least 150 minutes every week.

## 2023-03-17 NOTE — Progress Notes (Signed)
Subjective:     Patient ID: [name], female    DOB: [birth year], 48 y.o.   MRN: [id number]  Chief Complaint  Patient presents with   ESA Letter    Discuss need for ESA letter    HPI-here w/both daughters Discussed the use of AI scribe software for clinical note transcription with the patient, who gave verbal consent to proceed.  History of Present Illness   [name] is a 48 year old female with depression and anxiety who presents for emotional support.  She has recently acquired two dogs, which she has had for about a month, to help with her depression and anxiety. The dogs provide companionship and a reason to go outside for walks, which she finds beneficial. She has had dogs in the past, but notes that her depression and anxiety were not as severe at that time. The dogs are not registered as emotional support animals, and she primarily keeps them at home rather than taking them to public places.  She experiences thoughts of suicide but denies having any specific plan. These thoughts are not new but are current, and she attributes them to feelings of failure and disappointment. She acknowledges having two daughters and a mother who depend on her, which she recognizes as reasons to live. She has tried therapy in the past but is hesitant about medication, expressing a preference for counseling over psychiatric medication. She is open to talking to someone about her feelings but is concerned about being perceived as 'crazy'.  She mentions having trouble with school, although no specific details are provided.   acne-requesting clinda       Health Maintenance Due  Topic Date Due   OPHTHALMOLOGY EXAM  12/19/2022   Diabetic kidney evaluation - Urine ACR  03/13/2023    Past Medical History:  Diagnosis Date   Anemia    Asthma    Blood transfusion without reported diagnosis    Gestational diabetes    Type 2 diabetes mellitus (HCC)     Past Surgical History:   Procedure Laterality Date   arm surgery     CERVICAL CERCLAGE     CESAREAN SECTION     DILATION AND CURETTAGE OF UTERUS     HYSTERECTOMY ABDOMINAL WITH SALPINGECTOMY Bilateral 10/08/2021   Procedure: ABDOMINAL HYSTERECTOMY WITH BILATERAL  SALPINGECTOMY;  Surgeon: Milas Hock, MD;  Location: [facility] OR;  Service: Gynecology;  Laterality: Bilateral;   IR RADIOLOGIST EVAL & MGMT  09/04/2021   TOOTH EXTRACTION       Current Outpatient Medications:    albuterol (VENTOLIN HFA) 108 (90 Base) MCG/ACT inhaler, Inhale 2 puffs into the lungs every 6 (six) hours as needed for wheezing or shortness of breath., Disp: 1 each, Rfl: 1   amphetamine-dextroamphetamine (ADDERALL XR) 5 MG 24 hr capsule, Take 1 capsule (5 mg total) by mouth daily., Disp: 30 capsule, Rfl: 0   Calcium Carb-Cholecalciferol (CALCIUM + D3 PO), Take 1 tablet by mouth daily. When able to remember, Disp: , Rfl:    cetirizine (ZYRTEC ALLERGY) 10 MG tablet, Take 1 tablet (10 mg total) by mouth daily., Disp: 90 tablet, Rfl: 1   Cyanocobalamin (B-12) 1000 MCG TBCR, Take 1,000 mcg by mouth daily. When able to remember, Disp: 100 tablet, Rfl: 3   fluticasone (FLONASE) 50 MCG/ACT nasal spray, Place 1 spray into both nostrils in the morning and at bedtime., Disp: 16 g, Rfl: 5   glucose blood (ACCU-CHEK GUIDE) test strip, daily, Disp: 100 each, Rfl:  3   glucose blood test strip, Use as instructed, Disp: 100 each, Rfl: 12   ibuprofen (ADVIL) 600 MG tablet, Take 1 tablet (600 mg total) by mouth every 6 (six) hours as needed for fever or headache., Disp: 30 tablet, Rfl: 0   Lancets (FREESTYLE) lancets, Use as instructed, Disp: 100 each, Rfl: 12   ondansetron (ZOFRAN-ODT) 4 MG disintegrating tablet, Take 1 tablet (4 mg total) by mouth every 6 (six) hours as needed for nausea., Disp: 20 tablet, Rfl: 0   clindamycin (CLINDAGEL) 1 % gel, Apply topically 2 (two) times daily., Disp: 30 g, Rfl: 1  Allergies  Allergen Reactions   Shellfish Allergy Itching and  Swelling    Throat swells    Other Itching and Swelling    Hazelnut. Swelling of throat    Latex Itching, Rash and Other (See Comments)    Burning    ROS neg/noncontributory except as noted HPI/below      Objective:     BP 125/81   Pulse 73   Temp 98.6 F (37 C) (Temporal)   Resp 16   Ht 5\' 4"  (1.626 m)   Wt 171 lb 4 oz (77.7 kg)   LMP 08/13/2021 (Approximate)   SpO2 99%   BMI 29.39 kg/m  Wt Readings from Last 3 Encounters:  03/17/23 171 lb 4 oz (77.7 kg)  02/01/23 168 lb (76.2 kg)  01/26/23 169 lb 8 oz (76.9 kg)    Physical Exam   Gen: WDWN NAD HEENT: NCAT, conjunctiva not injected, sclera nonicteric CARDIAC: RRR, S1S2+, no murmur.  MSK: no gross abnormalities.  NEURO: A&O x3.  CN II-XII intact.  PSYCH: tearfull mood. Good eye contact     Assessment & Plan:  Mixed anxiety and depressive disorder -     Ambulatory referral to Behavioral Health  Type 2 diabetes mellitus with hyperglycemia, without long-term current use of insulin (HCC) -     Microalbumin / creatinine urine ratio  Acne vulgaris  Other orders -     Clindamycin Phosphate; Apply topically 2 (two) times daily.  Dispense: 30 g; Refill: 1  Assessment and Plan    Depression and Anxiety Ongoing depression and anxiety are present, with emotional support provided by two dogs and a [place] pig. Suicidal thoughts are reported, though without a plan. There is concern about being perceived as 'crazy' and hesitancy towards medication(has been on in past). The importance of seeking help if suicidal ideation worsens or a plan develops was discussed. A referral to a counselor for talk therapy is suggested. An emotional support letter for pets will be provided. Immediate help is advised if suicidal ideation worsens or a plan develops.  School Difficulties There are difficulties with school, potentially linked to depression and anxiety. School performance will be monitored, and support provided as needed.   DM  type 2-ordered urine microalb/creat to close gap   acne-requested refill on clinda(not filled since 2023  General Health Maintenance Routine health maintenance is required. An eye exam will be scheduled, and a urine microalbumin creatinine ratio test will be performed.  Follow-up Follow up with a counselor if the referral is accepted. Return for routine check-ups as needed.        Return if symptoms worsen or fail to improve.  [name], MD

## 2023-03-18 ENCOUNTER — Encounter: Payer: Self-pay | Admitting: Family Medicine

## 2023-03-18 LAB — MICROALBUMIN / CREATININE URINE RATIO
Creatinine,U: 97.5 mg/dL
Microalb Creat Ratio: 7.2 mg/g (ref 0.0–30.0)
Microalb, Ur: <0.7 mg/dL (ref 0.0–1.9)

## 2023-03-18 NOTE — Progress Notes (Signed)
Great-no protein in urine

## 2023-03-24 ENCOUNTER — Encounter: Payer: Medicaid Other | Admitting: Internal Medicine

## 2023-04-12 ENCOUNTER — Other Ambulatory Visit (HOSPITAL_COMMUNITY): Payer: Self-pay

## 2023-04-13 DIAGNOSIS — E1165 Type 2 diabetes mellitus with hyperglycemia: Secondary | ICD-10-CM

## 2023-04-16 ENCOUNTER — Encounter: Payer: Medicaid Other | Admitting: Internal Medicine

## 2023-04-22 ENCOUNTER — Ambulatory Visit: Payer: Medicaid Other | Admitting: Internal Medicine

## 2023-04-22 ENCOUNTER — Encounter: Payer: Self-pay | Admitting: Internal Medicine

## 2023-04-22 ENCOUNTER — Other Ambulatory Visit (HOSPITAL_BASED_OUTPATIENT_CLINIC_OR_DEPARTMENT_OTHER): Payer: Self-pay

## 2023-04-22 ENCOUNTER — Encounter: Payer: Self-pay | Admitting: Family

## 2023-04-22 VITALS — BP 124/81 | HR 69 | Temp 97.8°F | Resp 16 | Ht 64.0 in | Wt 167.2 lb

## 2023-04-22 DIAGNOSIS — E559 Vitamin D deficiency, unspecified: Secondary | ICD-10-CM

## 2023-04-22 DIAGNOSIS — F418 Other specified anxiety disorders: Secondary | ICD-10-CM | POA: Diagnosis not present

## 2023-04-22 DIAGNOSIS — L709 Acne, unspecified: Secondary | ICD-10-CM

## 2023-04-22 DIAGNOSIS — E1165 Type 2 diabetes mellitus with hyperglycemia: Secondary | ICD-10-CM

## 2023-04-22 DIAGNOSIS — G8928 Other chronic postprocedural pain: Secondary | ICD-10-CM

## 2023-04-22 DIAGNOSIS — R41840 Attention and concentration deficit: Secondary | ICD-10-CM

## 2023-04-22 DIAGNOSIS — Z90710 Acquired absence of both cervix and uterus: Secondary | ICD-10-CM | POA: Insufficient documentation

## 2023-04-22 DIAGNOSIS — D62 Acute posthemorrhagic anemia: Secondary | ICD-10-CM

## 2023-04-22 DIAGNOSIS — F4310 Post-traumatic stress disorder, unspecified: Secondary | ICD-10-CM | POA: Diagnosis not present

## 2023-04-22 DIAGNOSIS — J309 Allergic rhinitis, unspecified: Secondary | ICD-10-CM

## 2023-04-22 DIAGNOSIS — E65 Localized adiposity: Secondary | ICD-10-CM

## 2023-04-22 DIAGNOSIS — J4520 Mild intermittent asthma, uncomplicated: Secondary | ICD-10-CM

## 2023-04-22 MED ORDER — TRETINOIN 0.025 % EX CREA
TOPICAL_CREAM | Freq: Every day | CUTANEOUS | 0 refills | Status: DC
Start: 2023-04-22 — End: 2023-04-22

## 2023-04-22 MED ORDER — VITAMIN D3 125 MCG (5000 UT) PO CAPS
5000.0000 [IU] | ORAL_CAPSULE | Freq: Every day | ORAL | 3 refills | Status: DC
Start: 2023-04-22 — End: 2023-04-22

## 2023-04-22 MED ORDER — SIMPLY SALINE 0.9 % NA AERS
1.0000 | INHALATION_SPRAY | Freq: Every day | NASAL | 11 refills | Status: AC
Start: 2023-04-22 — End: ?
  Filled 2023-04-22: qty 127, fill #0

## 2023-04-22 MED ORDER — ALBUTEROL SULFATE HFA 108 (90 BASE) MCG/ACT IN AERS
2.0000 | INHALATION_SPRAY | Freq: Four times a day (QID) | RESPIRATORY_TRACT | 99 refills | Status: AC | PRN
  Filled 2023-04-22: qty 36, 50d supply, fill #0
  Filled 2023-09-01: qty 36, 50d supply, fill #1
  Filled 2024-02-17: qty 36, 50d supply, fill #2

## 2023-04-22 MED ORDER — SIMPLY SALINE 0.9 % NA AERS: 1.0000 | INHALATION_SPRAY | Freq: Every day | NASAL | 11 refills | Status: DC

## 2023-04-22 MED ORDER — TRETINOIN 0.025 % EX CREA
TOPICAL_CREAM | Freq: Every day | CUTANEOUS | 0 refills | Status: DC
Start: 2023-04-22 — End: 2023-11-05
  Filled 2023-04-22: qty 45, 30d supply, fill #0

## 2023-04-22 MED ORDER — VITAMIN D3 125 MCG (5000 UT) PO CAPS
5000.0000 [IU] | ORAL_CAPSULE | Freq: Every day | ORAL | 3 refills | Status: AC
Start: 2023-04-22 — End: ?
  Filled 2023-04-22: qty 90, 90d supply, fill #0

## 2023-04-22 MED ORDER — ALBUTEROL SULFATE HFA 108 (90 BASE) MCG/ACT IN AERS
2.0000 | INHALATION_SPRAY | Freq: Four times a day (QID) | RESPIRATORY_TRACT | 99 refills | Status: DC | PRN
Start: 2023-04-22 — End: 2023-04-22

## 2023-04-22 NOTE — Assessment & Plan Note (Signed)
 Assessment: Diabetes diagnosed late 2023 with improvement in glycemic control through dietary management. A1c trending from initial 11.0% to current 7.0%, still above goal of <7%. Previously had gestational diabetes. Family history of diabetes present. No evidence of nephropathy with negative microalbuminuria. No diabetic retinopathy on most recent eye exam. LDL well-controlled at 98 mg/dL. Patient has made significant lifestyle modifications including reducing sugar-sweetened beverages with positive impact on weight and glycemic control. Plan: Continue dietary management with emphasis on consistent carbohydrate intake and regular glucose monitoring Discussed benefits of GLP-1 receptor agonist therapy (specifically Mounjaro/tirzepatide) for both glycemic control and weight management; patient considering options Reviewed associated cardiovascular and renal protection benefits of GLP-1 therapy Verified Medicaid coverage for Mounjaro without prior authorization Complete diabetic foot exam today Continue monitoring: A1c due in June 2025 Follow-up in 6 months for diabetes management Maintain diabetes self-management with current glucose monitoring

## 2023-04-22 NOTE — Assessment & Plan Note (Signed)
 Assessment: Clinical symptoms consistent with vitamin D deficiency including bone pain, muscle weakness, and fatigue. No recent vitamin D levels available. Limited dietary vitamin D intake and minimal sun exposure. Calcium levels within normal range at 8.9 mg/dL. Previously had vitamin D deficiency diagnosis with discontinued supplementation. Plan: Resume vitamin D3 supplementation 5,000 IU daily Order vitamin D 25-OH level with next lab draw Discussed importance of supplementation for bone health and potential impact on overall well-being Emphasized consistent supplementation to improve symptoms and prevent complications Consider calcium supplementation if vitamin D levels remain low Reassess symptoms at next visit

## 2023-04-22 NOTE — Assessment & Plan Note (Signed)
 Assessment: Central fat distribution with current BMI 28.71. Positive weight loss trend noted with 15-pound reduction over past year. Plan: Congratulated on weight loss progress Encouraged continued dietary modifications Discussed benefits of resistance training for metabolic health Emphasized relationship between weight management and glycemic control Consider GLP-1 agonist therapy for dual benefit of diabetes management and weight loss

## 2023-04-22 NOTE — Assessment & Plan Note (Signed)
 Assessment: Ongoing symptoms of depression and anxiety with worsening PHQ-9 score to 13 (moderate depression), previously 11. Current screen positive for suicidal thoughts requiring safety assessment. 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 Prefers non-pharmacological management approaches. Plan: Safety assessment completed with no acute safety concerns identified Referral to psychology for therapeutic intervention Discussed EMDR therapy as evidence-based approach for trauma Reviewed Medicaid coverage for mental health services Attempt to identify non-[facility] options for therapy as per patient preference Follow-up on mental health symptoms at next visit Provided crisis resources and safety planning

## 2023-04-22 NOTE — Assessment & Plan Note (Signed)
 Assessment: Diagnosed ADHD with current prescription for amphetamine-dextroamphetamine 5mg  daily. Reports concerns about effectiveness and emotional side effects. Plan: Continue current medication while exploring alternative options Discussed non-stimulant approaches to ADHD management Consider referral to psychiatry if interested in medication adjustment Follow-up on symptom management at next visit

## 2023-04-22 NOTE — Assessment & Plan Note (Signed)
 Assessment: Exercise-induced and environmentally triggered asthma. Current management with as-needed albuterol. No recent exacerbations requiring urgent care or hospitalization. Symptoms well-controlled with current regimen. Plan: Renew albuterol inhaler with refills Discussed potential environmental triggers including household humidity Advised on appropriate use of rescue inhaler before exercise Discussed asthma action plan for symptom management Continue current regimen with as-needed albuterol

## 2023-04-22 NOTE — Patient Instructions (Addendum)
 After Visit Summary Your Diagnoses Type 2 diabetes mellitus Vitamin D deficiency Mild intermittent asthma Allergic rhinitis Mixed anxiety and depressive disorder Post-traumatic stress disorder (PTSD) Attention deficit disorder Chronic pain after hysterectomy Acne Medication Changes Today New Medications: Vitamin D3 5,000 units: Take 1 capsule by mouth daily Saline Nasal Spray: Use 1 spray in each nostril daily in the morning Tretinoin 0.025% cream: Apply a small amount to affected areas of face at bedtime Albuterol inhaler (renewed): Inhale 2 puffs every 6 hours as needed for wheezing or shortness of breath Important Instructions For Your Diabetes: Continue your excellent work with dietary changes Keep monitoring your blood sugar as directed We discussed the option of medication called tirzepatide Greggory Keen) which could help with both blood sugar control and weight management Your next A1c test will be due in June 2025 Your kidney function and eye exam were normal - great news! For Your Vitamin D Deficiency: Start taking Vitamin D3 5,000 units daily This may help improve your bone pain, muscle weakness, and fatigue We will check your vitamin D level at your next blood draw For Your Skin: Apply tretinoin cream at bedtime only You may experience redness, peeling, or dryness when starting - this is normal Use sunscreen daily as this medication can make your skin more sensitive to sunlight Continue using clindamycin gel twice daily as prescribed For Your Asthma and Allergies: Use your albuterol inhaler as needed for breathing difficulties Try the saline nasal spray daily to help with allergy symptoms Continue Zyrtec and Flonase as you have been For Your Mental Health: A referral to psychology has been placed for therapy We specifically discussed EMDR therapy which can be helpful for trauma-related symptoms We will work to find non-[facility] providers if possible Follow-Up  Appointments Psychology: You will be contacted to schedule an appointment Primary Care Follow-up: 6 months (September 2025) Important Health Reminders Your diabetic eye exam is due: November 2025 Your diabetic foot exam is due: March 2026 Your flu shot is due Your COVID-19 vaccine is due When to Seek Medical Attention Call 911 or go to the nearest emergency room if you experience: Difficulty breathing that is not relieved by your inhaler Chest pain Thoughts of harming yourself or others Contact our office if you experience: Blood sugars consistently over 300 mg/dL despite following your diet Severe skin reaction to the tretinoin cream Worsening mood or anxiety symptoms New or worsening side effects from any medication Resources Mental Health Crisis Resources: National Suicide Prevention Lifeline: 988 Crisis Text Line: Text HOME to 9078261857 Remember that your health is a journey, and we're here to support you every step of the way. The positive changes you've already made with your diet and lifestyle are making a difference!   Patient Instructions for Using Tretinoin (Retin-A) for Skin Rejuvenation What to Expect: Tretinoin helps improve fine lines, skin texture, and pigmentation by increasing collagen production and cell turnover. It can take 8-12 weeks to see visible improvement. Initial side effects (redness, peeling, dryness) are common but improve with continued use.  How to Use Tretinoin: Start Slowly: Use a pea-sized amount for the entire face. Apply only at night, as tretinoin makes skin sensitive to sunlight. Start every other night for 2 weeks, then increase to nightly if tolerated. Application Steps: Wash face with a gentle cleanser and pat dry completely. Wait 10-20 minutes before applying tretinoin to avoid irritation. Apply a thin layer (pea-sized amount) evenly over the face, avoiding the eyes, mouth, and corners of the nose. Follow with  a moisturizer to help  minimize dryness. Morning Routine: Always use sunscreen (SPF 30+) during the day, as tretinoin increases sun sensitivity. Continue using a gentle moisturizer to reduce dryness.  Common Side Effects & Solutions: Side Effect Solution  Redness & Peeling Apply every other night until skin adjusts.  Dryness & Flaking Use a moisturizer after application.  Increased Sensitivity Avoid harsh scrubs, exfoliants, or alcohol-based products.  Temporary Breakouts This is "purging" and usually lasts 4-6 weeks. Keep using tretinoin.   What to Avoid: ? Harsh exfoliants or scrubs (can worsen irritation). ? Benzoyl peroxide or strong acids (may deactivate tretinoin). ? Waxing or aggressive facial treatments (can cause burns). ? Skipping sunscreen (increases risk of sunburn and hyperpigmentation).  When to Call Your Doctor: Severe redness, burning, or swelling that does not improve. Extreme peeling or cracking despite moisturizer use. Signs of an allergic reaction (swelling, rash, difficulty breathing). Would you like me to provide a written handout for the patient?    We had a conversation today about healthy eating habits, exercise, calorie and carb goals for sustainable and successful weight loss. I gave her caloric and protein daily intake values and encouraged increasing fiber and water intake. I discussed weight loss medications that could be used with consideration of her specific past medical history and financial limitations.  The 3 things that almost all people who lose weight and keep it off do: 1.  Cut out all sugary beverages and all calorie-containing beverages  2.  Resistance training (cardiac exercise not as important for maintaining muscle mass) 3.  Actually counting calories and carbs and deciding against foods that have high amounts - I.e. Check nutrition labels.    Sinusitis and associated upper respiratory infections:    A sinus infection is soreness and swelling (inflammation)  of your sinuses. Sinuses are hollow spaces in the bones around your face. They are located: Around your eyes. In the middle of your forehead. Behind your nose. In your cheekbones. Your sinuses and nasal passages are lined with a fluid called mucus. Mucus drains out of your sinuses. Swelling can trap mucus in your sinuses. This lets germs (bacteria, virus, or fungus) grow, which leads to infection. Most of the time, this condition is caused by a virus.  Chronic allergies can increase swelling in the passages of the sinuses, reducing drainage of mucous and causing infections. What are the causes? Allergies. Asthma. Germs. Things that block your nose or sinuses. Growths in the nose (nasal polyps). Chemicals or irritants in the air. A fungus. This is rare. What increases the risk? Having a weak body defense system (immune system). Doing a lot of swimming or diving. Using nasal sprays too much. Smoking. What are the signs or symptoms? The main symptoms of this condition are pain and a feeling of pressure around the sinuses. Other symptoms include: Stuffy nose (congestion). This may make it hard to breathe through your nose. Runny nose (drainage). Soreness, swelling, and warmth in the sinuses. A cough that may get worse at night. Being unable to smell and taste. Mucus that collects in the throat or the back of the nose (postnasal drip). This may cause a sore throat or bad breath. Being very tired (fatigued). A fever.   How can these infections spread into my ear?  The eustachian tube drains to the sinuses from the mental ear, and can become clogged and infected.   How is this diagnosed? Your symptoms. Your medical history. A physical exam. Tests to find out if your  condition is short-term (acute) or long-term (chronic). Your doctor may: Check your nose for growths (polyps). Check your sinuses using a tool that has a light on one end (endoscope). Check for allergies or germs. Do  imaging tests, such as an MRI or CT scan. How is this treated? The most important thing, regardless of the cause, is the sinus rinse & steroid nasal spray combo:  Saline misting nasal spray clears allergens, irritants, mucus from sinuses. Use it every time you feel the need to blow your nose and just before using the steroid nasal spray. This allows the nasal steroids to reach & open Eustachian tubes and reduce symptoms  Tilt head to the side over sink. Insert nozzle into one nostril, depressing on the textured area at the base of the nozzle so a gentle mist fills sinus passages and flows out nostrils. Repeat in other nostril. Additional Treatment for this condition depends on the cause and whether it is short-term or long-term. If caused by a virus, your symptoms should go away on their own within 10 days. You may be given medicines to relieve symptoms. They include: OTC (available over the counter without a prescription) Medicines like robitussin are great to manage multiple symptom(s)  If its COVID then there is a special medicine for that, but no medications for other specific viruses yet Medicines that treat allergies (antihistamines). Benadryl is strongest antihistamine but very drowsy.  Levocetirizine (xyzal) is the strongest non-drowsy antihistamine.  There are antihistamine eye drops  Over-the-counter pain relievers Decongestants: OTC drugs like Afrin (oxymetazoline) or Sudafed (pseudoephedrine) work by reducing the swelling of blood vessels in the nasal passages and Eustachian tubes.  Never use these medications for more than a few days at a time, especially afrin is dependency-forming. If this isn't working or you need more than a few days of afrin or sudafed... you should make an appointment.   These kinds of decongestants are less effective in infections than in allergies.  If caused by bacteria, your doctor may wait to see if you will get better without treatment. Consider starting  antibiotics only when: Symptoms Last Over 10 Days: If your symptoms persist beyond 10 days, it might be a sign of a bacterial infection. Severe Symptoms: High fever, significant facial pressure, or severe pain could indicate a need for antibiotics. Ear Pain: Pain radiating to your ears can suggest sinus involvement that might benefit from antibiotics Thick Creamy Discharge: Thick, yellow, or green mucus may be a sign of a bacterial infection, whether its discharged from the nose, the eyes, or the throat. Worsening Symptoms: If your symptoms initially improve but then worsen, it's important to get re-evaluated.  Our Approach to Your Care: Starting with Non-Antibiotics: In most cases, we can manage sinus infections with rest, over-the-counter pain relievers, and home remedies like nasal irrigation. This helps [place] avoid unnecessary antibiotics. Usually, an infection can be cleared with good sinus hygiene without the risk of antibiotics. Monitoring Your Progress: Let's keep in touch about your symptoms. If they worsen or meet the criteria listed above, please contact us right away. Completing the Course: If antibiotics become necessary, it's crucial to finish the entire prescription to ensure complete healing. Supporting Gut Health: Probiotics can be helpful alongside antibiotics to maintain healthy gut bacteria.  Yogurt is cheaper than the pills.  If caused by growths in the nose, surgery may be needed.  Follow these instructions at home: Medicines Take, use, or apply over-the-counter and prescription medicines only as told by your  doctor. These may include nasal sprays. If you were prescribed an antibiotic medicine, take it as told by your doctor. Do not stop taking it even if you start to feel better. Hydrate and humidify  Drink enough water to keep your pee (urine) pale yellow. Use a cool mist humidifier to keep the humidity level in your home above 50%. Breathe in steam for 10-15 minutes, 3-4  times a day, or as told by your doctor. You can do this in the bathroom while a hot shower is running. Try not to spend time in cool or dry air. Decongest and rinse out your sinuses with daily SimplySaline gentle misting spray- apply any medicated sinus sprays after using this misting to blow your nose. Rest Rest as much as you can. Sleep with your head raised (elevated). Make sure you get enough sleep each night. General instructions  Put a warm, moist washcloth on your face 3-4 times a day, or as often as told by your doctor. Use nasal saline washes as often as told by your doctor. Wash your hands often with soap and water. If you cannot use soap and water, use hand sanitizer. Do not smoke. Avoid being around people who are smoking (secondhand smoke). Keep all follow-up visits. Contact a doctor if: You have a fever. Your symptoms get worse. Your symptoms do not get better within 10 days. Get help right away if: You have a very bad headache. You cannot stop vomiting. You have very bad pain or swelling around your face or eyes. You have trouble seeing. You feel confused. Your neck is stiff. You have trouble breathing. These symptoms may be an emergency. Get help right away. Call 911. Do not wait to see if the symptoms will go away. Do not drive yourself to the hospital. Summary A sinus infection is swelling of your sinuses. Sinuses are hollow spaces in the bones around your face. This condition is caused by tissues in your nose that become inflamed or swollen. This traps germs. These can lead to infection. Daily sinus rinsing can reduce your risk for these. If you were prescribed an antibiotic medicine, take it as told by your doctor. Do not stop taking it even if you start to feel better. Keep all follow-up visits.

## 2023-04-22 NOTE — Assessment & Plan Note (Signed)
 Assessment: Chronic seasonal allergic rhinitis currently managed with cetirizine 10mg  daily and fluticasone nasal spray. Plan: Continue cetirizine 10mg  daily and fluticasone nasal spray Add saline nasal spray for additional symptom management and to potentially reduce medication dependence Discussed non-pharmacological approaches including environmental modifications Continue current regimen with consideration of allergen avoidance strategies

## 2023-04-22 NOTE — Assessment & Plan Note (Signed)
 Assessment: Facial acne requiring topical treatment. Plan: Prescribe tretinoin 0.025% cream for nightly application Discussed potential side effects including initial skin irritation, dryness, and photosensitivity Advised on proper application technique and sunscreen use Continue clindamycin 1% gel as currently prescribed Review response to treatment at next visit

## 2023-04-22 NOTE — Progress Notes (Signed)
 [facility]  Phone: [phone]  - Medical Office Visit -  Visit Date: 04/22/2023 Patient: [name]   DOB: May 26, 1975   48 y.o. Female  MRN: [id number] Patient Care Team: Jeani Sow, MD as PCP - General (Family Medicine) Today's Health Care Provider: Lula Olszewski, MD  ===========================================    Chief Complaint / Reason for Visit: Transfer of Care (Transfer of Care/Discuss therapy referral, does not want to see psychiatry)   Background: 48 y.o. female who has Iron deficiency anemia due to chronic blood loss; Vitamin D deficiency; Mixed anxiety and depressive disorder; Allergic rhinitis; Type 2 diabetes mellitus with hyperglycemia, without long-term current use of insulin (HCC); Mild intermittent asthma without complication; Attention or concentration deficit; Chronic pain after hysterectomy; PTSD (post-traumatic stress disorder); Central adiposity; and Acne on their problem list.  History of Present Illness The patient is an adult female with multiple chronic medical conditions presenting for transfer of care. She was diagnosed with type 2 diabetes at the end of last year, with a family history of diabetes and previous gestational diabetes during pregnancy. Her glycemic control has improved with significant dietary modifications, particularly reducing sugary beverages. She is currently managing through diet alone without medication. Recent A1c shows improvement from initial value of 11.0% to current 7.0%. She reports ongoing symptoms consistent with vitamin D deficiency including bone pain, muscle weakness, and general fatigue. She has not had recent vitamin D level testing and acknowledges limited dietary calcium intake and minimal sun exposure. Her asthma remains exercise-induced and environmentally triggered, particularly by pollen. She uses albuterol inhaler as needed for symptom management, primarily during physical activity or  allergen exposure. She reports a history of anemia previously exacerbated by fibroids that caused heavy bleeding. Following hysterectomy with salpingectomy in September 2023, her anemia has resolved with hemoglobin now consistently normal at 13.6 g/dL. She describes experiencing mild depression and anxiety, which she attributes to past traumas and her surgical experience with occasional night sweats. She has expressed preference for non-pharmacological approaches to mental health management. PHQ-9 screening demonstrates moderate depression with a score of 13, increased from previous scores, with notation of suicidal thoughts on current screening requiring further assessment. She has previously diagnosed ADHD and has been off medication due to concerns about effectiveness and emotional side effects. She expresses interest in exploring alternative treatment approaches avoiding stimulants, though she currently has a prescription for low-dose Adderall. She manages allergic rhinitis with Zyrtec and Flonase as needed but is interested in non-medication approaches to symptom management. Weight has shown steady improvement with a 15-pound reduction over the past year, from 182 lbs to current 167 lbs, with BMI decreasing from approximately 31 to current 28.7. Home medications include amphetamine-dextroamphetamine 5mg  daily, calcium with vitamin D supplement, cetirizine 10mg  daily, clindamycin 1% gel twice daily, vitamin B12 supplement, fluticasone nasal spray twice daily, diabetic testing supplies, ibuprofen 600mg  as needed, and ondansetron 4mg  as needed for nausea.      Problem overviews updated today: Problem  Chronic Pain After Hysterectomy   Had anemia due to menorrhagia and fibroids, 2023.  Has some pain after but anemia resolved. Some PTSD /depression /anxiety/stress also.   Ptsd (Post-Traumatic Stress Disorder)  Central Adiposity  Acne  Attention Or Concentration Deficit   Low dose Adderall not     Mild Intermittent Asthma Without Complication   As needed albuterol only, exercise induced and pollen/ atmosphere.    Type 2 Diabetes Mellitus With Hyperglycemia, Without Long-Term Current Use of Insulin (Hcc)  Off medication(s) diet controlled now  Lab Results  Component Value Date   HGBA1C 7.0 (H) 01/26/2023   HGBA1C 6.5 08/11/2022   HGBA1C 6.8 (H) 03/12/2022   HGBA1C 11.0 (A) 12/05/2021   HGBA1C 10.3 Repeated and verified X2. (H) 12/05/2021   Lab Results  Component Value Date   LDLCALC 98 01/26/2023   Lab Results  Component Value Date   MICROALBUR <0.7 03/17/2023   MICROALBUR 1.1 03/12/2022   Diabetes Composite Score: 4  Values used to calculate this score:   Points  Metrics      1        Blood Pressure: 125/81      0        Prescribed Statins: No      1        Hemoglobin A1c: 7%      1        Smokes Tobacco: No      1        Prescribed Aspirin: N/A - patient does not meet cardiovascular risk criteria      Health Maintenance: Diabetes Health Maintenance Due  Topic Date Due   OPHTHALMOLOGY EXAM  12/19/2022   FOOT EXAM  04/10/2023   HEMOGLOBIN A1C  07/27/2023   04/10/2022 Diabetic Foot Exam - Simple   No data filed    Care Team Ophthalmologist:  No care team member to display      Allergic Rhinitis   Chronic,.  Seasonal   controlled.  Continue zyrtec 10mg  daily and flonase 1 spray each nostril twice daily    Mixed Anxiety and Depressive Disorder   Has some PTSD, wants to avoid psychiatry and medication(s) for now.    03/17/2023    4:47 PM 11/10/2022    4:23 PM 06/26/2022    1:50 PM  Depression screen PHQ 2/9  Decreased Interest 2 2 2   Down, Depressed, Hopeless 3 2 2   PHQ - 2 Score 5 4 4   Altered sleeping 0 0 1  Tired, decreased energy 1 1 1   Change in appetite 1 0 1  Feeling bad or failure about yourself  3 3 0  Trouble concentrating 1 3 0  Moving slowly or fidgety/restless 0 0 0  Suicidal thoughts 2 0 0  PHQ-9 Score 13 11 7   Difficult doing  work/chores Somewhat difficult Extremely dIfficult Somewhat difficult       Vitamin D Deficiency   Clinical symptoms that can be associated with vitamin D deficiency include bone pain, muscle weakness, and general fatigue Vitamin D levels are affected by dietary choices and sunlight exposure  No results found for: "VD25OH" Lab Results  Component Value Date   TSH 1.61 12/05/2021   ALKPHOS 46 01/26/2023   CALCIUM 8.9 01/26/2023   CAION 1.28 12/05/2021   Lab Results  Component Value Date   CALCIUM 8.9 01/26/2023   CALCIUM 9.6 08/11/2022   CALCIUM 9.2 03/12/2022   CALCIUM 9.7 01/12/2022   CALCIUM 9.2 12/06/2021   No known family history osteoporosis, no thyroid problem... so DEXA not needed until 58    Postoperative Anemia Due to Acute Blood Loss (Resolved)   Lab Results  Component Value Date/Time   MCV 87.3 01/26/2023 10:16 AM   MCV 87.4 08/11/2022 02:15 PM   MCV 84.8 03/12/2022 12:08 PM   MCV 81.9 01/12/2022 10:48 AM   MCV 80.7 12/06/2021 07:49 PM   MCV 75.8 (L) 05/27/2015 02:41 PM   Lab Results  Component Value Date/Time   HGB 13.6  01/26/2023 10:16 AM   HGB 13.3 08/11/2022 02:15 PM   HGB 13.7 03/12/2022 12:08 PM   HGB 13.6 01/12/2022 10:48 AM   HGB 13.1 12/06/2021 07:49 PM   HGB 10.7 (L) 11/13/2021 10:54 AM   HGB 10.7 (L) 08/14/2021 02:21 PM   HGB 9.3 (L) 06/16/2021 02:42 PM   HGB 9.2 (L) 05/27/2015 02:41 PM      Component Value Date/Time   IRON 98 03/12/2022 1208   IRON 194 (H) 05/27/2015 1441   TIBC 261.8 03/12/2022 1208   TIBC 358 05/27/2015 1441   FERRITIN 75.9 03/12/2022 1208   FERRITIN 11 05/27/2015 1441   IRONPCTSAT 37.4 03/12/2022 1208   IRONPCTSAT 54 05/27/2015 1441         Medications updated/reviewed: Current Outpatient Medications on File Prior to Visit  Medication Sig   amphetamine-dextroamphetamine (ADDERALL XR) 5 MG 24 hr capsule Take 1 capsule (5 mg total) by mouth daily.   Calcium Carb-Cholecalciferol (CALCIUM + D3 PO) Take 1 tablet  by mouth daily. When able to remember   cetirizine (ZYRTEC ALLERGY) 10 MG tablet Take 1 tablet (10 mg total) by mouth daily.   clindamycin (CLINDAGEL) 1 % gel Apply topically 2 (two) times daily.   Cyanocobalamin (B-12) 1000 MCG TBCR Take 1,000 mcg by mouth daily. When able to remember   fluticasone (FLONASE) 50 MCG/ACT nasal spray Place 1 spray into both nostrils in the morning and at bedtime.   glucose blood (ACCU-CHEK GUIDE) test strip daily   glucose blood test strip Use as instructed   ibuprofen (ADVIL) 600 MG tablet Take 1 tablet (600 mg total) by mouth every 6 (six) hours as needed for fever or headache.   Lancets (FREESTYLE) lancets Use as instructed   ondansetron (ZOFRAN-ODT) 4 MG disintegrating tablet Take 1 tablet (4 mg total) by mouth every 6 (six) hours as needed for nausea.   No current facility-administered medications on file prior to visit.   Medications Discontinued During This Encounter  Medication Reason   albuterol (VENTOLIN HFA) 108 (90 Base) MCG/ACT inhaler Reorder   Cholecalciferol (VITAMIN D3) 125 MCG (5000 UT) CAPS    albuterol (VENTOLIN HFA) 108 (90 Base) MCG/ACT inhaler    Saline (SIMPLY SALINE) 0.9 % AERS    tretinoin (RETIN-A) 0.025 % cream    Current Meds  Medication Sig   amphetamine-dextroamphetamine (ADDERALL XR) 5 MG 24 hr capsule Take 1 capsule (5 mg total) by mouth daily.   Calcium Carb-Cholecalciferol (CALCIUM + D3 PO) Take 1 tablet by mouth daily. When able to remember   cetirizine (ZYRTEC ALLERGY) 10 MG tablet Take 1 tablet (10 mg total) by mouth daily.   clindamycin (CLINDAGEL) 1 % gel Apply topically 2 (two) times daily.   Cyanocobalamin (B-12) 1000 MCG TBCR Take 1,000 mcg by mouth daily. When able to remember   fluticasone (FLONASE) 50 MCG/ACT nasal spray Place 1 spray into both nostrils in the morning and at bedtime.   glucose blood (ACCU-CHEK GUIDE) test strip daily   glucose blood test strip Use as instructed   ibuprofen (ADVIL) 600 MG  tablet Take 1 tablet (600 mg total) by mouth every 6 (six) hours as needed for fever or headache.   Lancets (FREESTYLE) lancets Use as instructed   ondansetron (ZOFRAN-ODT) 4 MG disintegrating tablet Take 1 tablet (4 mg total) by mouth every 6 (six) hours as needed for nausea.   [DISCONTINUED] albuterol (VENTOLIN HFA) 108 (90 Base) MCG/ACT inhaler Inhale 2 puffs into the lungs every 6 (six) hours  as needed for wheezing or shortness of breath.   [DISCONTINUED] Cholecalciferol (VITAMIN D3) 125 MCG (5000 UT) CAPS Take 1 capsule (5,000 Units total) by mouth daily.   [DISCONTINUED] Saline (SIMPLY SALINE) 0.9 % AERS Place 1 spray into the nose daily at 6 (six) AM.   [DISCONTINUED] tretinoin (RETIN-A) 0.025 % cream Apply topically at bedtime.    Allergies:   Allergies  Allergen Reactions   Shellfish Allergy Itching and Swelling    Throat swells    Other Itching and Swelling    Hazelnut. Swelling of throat    Latex Itching, Rash and Other (See Comments)    Burning    Past Medical History:  has a past medical history of Anemia, Asthma, Blood transfusion without reported diagnosis, Gestational diabetes, Postoperative anemia due to acute blood loss (10/09/2021), and Type 2 diabetes mellitus (HCC). Past Surgical History:   has a past surgical history that includes arm surgery; Dilation and curettage of uterus; Cesarean section; Tooth extraction; Cervical cerclage; IR Radiologist Eval & Mgmt (09/04/2021); and Hysterectomy abdominal with salpingectomy (Bilateral, 10/08/2021). Social History:   reports that she has never smoked. She has never used smokeless tobacco. She reports that she does not drink alcohol and does not use drugs. Family History:  family history includes Breast cancer in her maternal aunt; Hypertension in her brother, maternal uncle, and mother. Depression Screen and Health Maintenance:    03/17/2023    4:47 PM 11/10/2022    4:23 PM 06/26/2022    1:50 PM 03/12/2022   12:14 PM  PHQ 2/9 Scores   PHQ - 2 Score 5 4 4 4   PHQ- 9 Score 13 11 7 12    Health Maintenance  Topic Date Due   OPHTHALMOLOGY EXAM  12/19/2022   FOOT EXAM  04/10/2023   INFLUENZA VACCINE  05/03/2023 (Originally 09/03/2022)   COVID-19 Vaccine (3 - Pfizer risk series) 05/08/2023 (Originally 10/12/2019)   DTaP/Tdap/Td (1 - Tdap) 02/01/2024 (Originally 10/18/1992)   Pneumococcal Vaccine 92-95 Years old (1 of 2 - PCV) 03/16/2024 (Originally 10/19/1979)   HEMOGLOBIN A1C  07/27/2023   Diabetic kidney evaluation - eGFR measurement  01/26/2024   Diabetic kidney evaluation - Urine ACR  03/16/2024   Fecal DNA (Cologuard)  06/18/2025   Hepatitis C Screening  Completed   HIV Screening  Completed   HPV VACCINES  Aged Out   Immunization History  Administered Date(s) Administered   PFIZER(Purple Top)SARS-COV-2 Vaccination 08/24/2019, 09/14/2019   Td 04/03/1980     Objective   Physical ExamBP 124/81   Pulse 69   Temp 97.8 F (36.6 C) (Temporal)   Resp 16   Ht 5\' 4"  (1.626 m)   Wt 167 lb 4 oz (75.9 kg)   LMP 08/13/2021 (Approximate)   SpO2 98%   BMI 28.71 kg/m  Wt Readings from Last 10 Encounters:  04/22/23 167 lb 4 oz (75.9 kg)  03/17/23 171 lb 4 oz (77.7 kg)  02/01/23 168 lb (76.2 kg)  01/26/23 169 lb 8 oz (76.9 kg)  11/10/22 172 lb 6 oz (78.2 kg)  08/25/22 171 lb (77.6 kg)  08/11/22 177 lb (80.3 kg)  06/26/22 179 lb 8 oz (81.4 kg)  05/11/22 182 lb 8 oz (82.8 kg)  04/10/22 181 lb 6 oz (82.3 kg)  Vital signs reviewed.  Nursing notes reviewed. Weight trend reviewed. Abnormalities and problem-specific physical exam findings: seems a little depressed. General Appearance:  Well developed, well nourished, well-groomed, healthy-appearing female with Body mass index is 28.71 kg/m. No acute distress  appreciable.   Skin: Clear and well-hydrated. Pulmonary:  Normal work of breathing at rest, no respiratory distress apparent. SpO2: 98 %  Musculoskeletal: She demonstrates smooth and coordinated movements throughout all  major joints.All extremities are intact.  Neurological:  Awake, alert, oriented, and engaged.  No obvious focal neurological deficits or cognitive impairments.  Sensorium seems unclouded.  Psychiatric:  Appropriate mood, pleasant and cooperative demeanor, and engaged during the exam  Reviewed Results & Data Results LABS Hb: 9 g/dL (6578) I6N: 62% (95/2841)    No results found for any visits on 04/22/23.  Office Visit on 03/17/2023  Component Date Value   Microalb, Ur 03/17/2023 <0.7    Creatinine,U 03/17/2023 97.5    Microalb Creat Ratio 03/17/2023 7.2   Office Visit on 02/01/2023  Component Date Value   SARS Coronavirus 2 Ag 02/01/2023 Negative    Rapid Strep A Screen 02/01/2023 Positive (A)    Influenza A, POC 02/01/2023 Positive (A)    Influenza B, POC 02/01/2023 Negative   Office Visit on 01/26/2023  Component Date Value   WBC 01/26/2023 3.7 (L)    RBC 01/26/2023 4.59    Hemoglobin 01/26/2023 13.6    HCT 01/26/2023 40.0    MCV 01/26/2023 87.3    MCHC 01/26/2023 34.0    RDW 01/26/2023 13.5    Platelets 01/26/2023 186.0    Neutrophils Relative % 01/26/2023 44.6    Lymphocytes Relative 01/26/2023 41.9    Monocytes Relative 01/26/2023 7.6    Eosinophils Relative 01/26/2023 4.5    Basophils Relative 01/26/2023 1.4    Neutro Abs 01/26/2023 1.7    Lymphs Abs 01/26/2023 1.6    Monocytes Absolute 01/26/2023 0.3    Eosinophils Absolute 01/26/2023 0.2    Basophils Absolute 01/26/2023 0.1    Sodium 01/26/2023 139    Potassium 01/26/2023 3.6    Chloride 01/26/2023 104    CO2 01/26/2023 29    Glucose, Bld 01/26/2023 123 (H)    BUN 01/26/2023 14    Creatinine, Ser 01/26/2023 0.62    Total Bilirubin 01/26/2023 1.2    Alkaline Phosphatase 01/26/2023 46    AST 01/26/2023 16    ALT 01/26/2023 10    Total Protein 01/26/2023 6.8    Albumin 01/26/2023 4.1    GFR 01/26/2023 104.68    Calcium 01/26/2023 8.9    Hgb A1c MFr Bld 01/26/2023 7.0 (H)    Cholesterol 01/26/2023 157     Triglycerides 01/26/2023 70.0    HDL 01/26/2023 44.90    VLDL 01/26/2023 14.0    LDL Cholesterol 01/26/2023 98    Total CHOL/HDL Ratio 01/26/2023 3    NonHDL 01/26/2023 111.77    Amphetamines, Urine 01/26/2023 Negative    Barbiturate Quant, Ur 01/26/2023 Negative    Benzodiazepine Quant, Ur 01/26/2023 Negative    Cannabinoid Quant, Ur 01/26/2023 Negative    Cocaine (Metab.) 01/26/2023 Negative    Opiate Quant, Ur 01/26/2023 Negative    PCP Quant, Ur 01/26/2023 Negative    Methadone Screen, Urine 01/26/2023 Negative    Propoxyphene 01/26/2023 Negative    Ethanol, Urine 01/26/2023 Negative   Lab on 11/11/2022  Component Date Value   QuantiFERON-TB Gold Plus 11/11/2022 NEGATIVE    NIL 11/11/2022 0.03    Mitogen-NIL 11/11/2022 >10.00    TB1-NIL 11/11/2022 0.01    TB2-NIL 11/11/2022 0.00   Office Visit on 08/11/2022  Component Date Value   WBC 08/11/2022 3.7 (L)    RBC 08/11/2022 4.55    Hemoglobin 08/11/2022 13.3    HCT  08/11/2022 39.7    MCV 08/11/2022 87.4    MCHC 08/11/2022 33.4    RDW 08/11/2022 13.9    Platelets 08/11/2022 203.0    Neutrophils Relative % 08/11/2022 42.2 (L)    Lymphocytes Relative 08/11/2022 45.7    Monocytes Relative 08/11/2022 7.6    Eosinophils Relative 08/11/2022 3.1    Basophils Relative 08/11/2022 1.4    Neutro Abs 08/11/2022 1.5    Lymphs Abs 08/11/2022 1.7    Monocytes Absolute 08/11/2022 0.3    Eosinophils Absolute 08/11/2022 0.1    Basophils Absolute 08/11/2022 0.1    Sodium 08/11/2022 140    Potassium 08/11/2022 3.9    Chloride 08/11/2022 103    CO2 08/11/2022 31    Glucose, Bld 08/11/2022 90    BUN 08/11/2022 9    Creatinine, Ser 08/11/2022 0.62    Total Bilirubin 08/11/2022 1.5 (H)    Alkaline Phosphatase 08/11/2022 49    AST 08/11/2022 15    ALT 08/11/2022 11    Total Protein 08/11/2022 6.6    Albumin 08/11/2022 4.1    GFR 08/11/2022 105.02    Calcium 08/11/2022 9.6    Hgb A1c MFr Bld 08/11/2022 6.5   Scanned Document on  05/19/2022  Component Date Value   HM Diabetic Eye Exam 12/18/2021 No Retinopathy    No image results found.   No results found.  DG Toe 5th Right Result Date: 08/25/2022 CLINICAL DATA:  Pain. Trauma. Bruise. Glass bottle dropped onto toe. Bruising and swelling. EXAM: RIGHT FIFTH TOE COMPARISON:  None Available. FINDINGS: The fifth toe DIP joint is fused, likely the patient's normal anatomy. No definite acute fracture is seen. No curvilinear lucency is seen on frontal or oblique view. On lateral view there is subtle curvilinear lucency overlying the distal aspect of the fused middle and distal phalanges, favored to represent the normal bone contours and overlapping bones. Normal alignment. Joint spaces are preserved. IMPRESSION: No definite acute fracture is seen.  Please see above discussion. Electronically Signed   By: Neita Garnet M.D.   On: 08/25/2022 19:03        Assessment & Plan Vitamin D deficiency Assessment: Clinical symptoms consistent with vitamin D deficiency including bone pain, muscle weakness, and fatigue. No recent vitamin D levels available. Limited dietary vitamin D intake and minimal sun exposure. Calcium levels within normal range at 8.9 mg/dL. Previously had vitamin D deficiency diagnosis with discontinued supplementation. Plan: Resume vitamin D3 supplementation 5,000 IU daily Order vitamin D 25-OH level with next lab draw Discussed importance of supplementation for bone health and potential impact on overall well-being Emphasized consistent supplementation to improve symptoms and prevent complications Consider calcium supplementation if vitamin D levels remain low Reassess symptoms at next visit Type 2 diabetes mellitus with hyperglycemia, without long-term current use of insulin (HCC) Assessment: Diabetes diagnosed late 2023 with improvement in glycemic control through dietary management. A1c trending from initial 11.0% to current 7.0%, still above goal of <7%.  Previously had gestational diabetes. Family history of diabetes present. No evidence of nephropathy with negative microalbuminuria. No diabetic retinopathy on most recent eye exam. LDL well-controlled at 98 mg/dL. Patient has made significant lifestyle modifications including reducing sugar-sweetened beverages with positive impact on weight and glycemic control. Plan: Continue dietary management with emphasis on consistent carbohydrate intake and regular glucose monitoring Discussed benefits of GLP-1 receptor agonist therapy (specifically Mounjaro/tirzepatide) for both glycemic control and weight management; patient considering options Reviewed associated cardiovascular and renal protection benefits of GLP-1 therapy Verified Medicaid  coverage for [facility] without prior authorization Complete diabetic foot exam today Continue monitoring: A1c due in June 2025 Follow-up in 6 months for diabetes management Maintain diabetes self-management with current glucose monitoring Postoperative anemia due to acute blood loss Resolved Assessment: History of anemia due to fibroids causing heavy bleeding. Current hemoglobin normal at 13.6 g/dL with normal iron studies. Condition resolved following hysterectomy in 2023. Plan: No active management needed for resolved condition Monitor CBC with annual labs Document resolution of previously diagnosed condition PTSD (post-traumatic stress disorder) Assessment: Post-traumatic stress symptoms related to past traumas and surgical experience. Currently managed without medication. Symptoms impact daily functioning with associated sleep disturbance including night sweats. Plan: Referral to psychology for trauma-focused therapy Specifically discussed benefits of EMDR therapy for PTSD symptoms Provided education on trauma response and symptom management Consider medication options if symptoms worsen Follow-up on symptom progress at next visit Mixed anxiety and depressive  disorder Assessment: Ongoing symptoms of depression and anxiety with worsening PHQ-9 score to 13 (moderate depression), previously 11. Current screen positive for suicidal thoughts requiring safety assessment. 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 Prefers non-pharmacological management approaches. Plan: Safety assessment completed with no acute safety concerns identified Referral to psychology for therapeutic intervention Discussed EMDR therapy as evidence-based approach for trauma Reviewed Medicaid coverage for mental health services Attempt to identify non-[facility] options for therapy as per patient preference Follow-up on mental health symptoms at next visit Provided crisis resources and safety planning Mild intermittent asthma without complication Assessment: Exercise-induced and environmentally triggered asthma. Current management with as-needed albuterol. No recent exacerbations requiring urgent care or hospitalization. Symptoms well-controlled with current regimen. 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 Pain improved but still present. Plan: Continue current pain management approach Discussed relationship between anxiety/depression and chronic pain Consider physical therapy referral if pain persists Reassess at next visit Attention or concentration deficit Assessment: Diagnosed ADHD with current prescription for amphetamine-dextroamphetamine 5mg  daily. Reports concerns about effectiveness and emotional side effects. Plan: Continue current medication while exploring alternative  options Discussed non-stimulant approaches to ADHD management Consider referral to psychiatry if interested in medication adjustment Follow-up on symptom management at next visit Allergic rhinitis, unspecified seasonality, unspecified trigger Assessment: Chronic seasonal allergic rhinitis currently managed with cetirizine 10mg  daily and fluticasone nasal spray. Plan: Continue cetirizine 10mg  daily and fluticasone nasal spray Add saline nasal spray for additional symptom management and to potentially reduce medication dependence Discussed non-pharmacological approaches including environmental modifications Continue current regimen with consideration of allergen avoidance strategies Central adiposity Assessment: Central fat distribution with current BMI 28.71. Positive weight loss trend noted with 15-pound reduction over past year. Plan: Congratulated on weight loss progress Encouraged continued dietary modifications Discussed benefits of resistance training for metabolic health Emphasized relationship between weight management and glycemic control Consider GLP-1 agonist therapy for dual benefit of diabetes management and weight loss Acne, unspecified acne type Assessment: Facial acne requiring topical treatment. Plan: Prescribe tretinoin 0.025% cream for nightly application Discussed potential side effects including initial skin irritation, dryness, and photosensitivity Advised on proper application technique and sunscreen use Continue clindamycin 1% gel as currently prescribed Review response to treatment at next visit Health Maintenance Diabetic eye exam completed in November 2023 with no retinopathy Diabetic foot  exam completed today with normal findings Next A1c due June 2025 Diabetic kidney evaluation completed February 2025 with normal findings Influenza vaccine due COVID-19 vaccine due for updated strain Continued emphasis on preventive care measures Total face-to-face time  spent with patient: 45 minutes, including counseling on diagnosis and management options, reviewing lab results, , and coordinating care.  Diagnoses and all orders for this visit: Vitamin D deficiency -     Cholecalciferol (VITAMIN D3) 125 MCG (5000 UT) CAPS; Take 1 capsule (5,000 Units total) by mouth daily. Type 2 diabetes mellitus with hyperglycemia, without long-term current use of insulin (HCC) Postoperative anemia due to acute blood loss PTSD (post-traumatic stress disorder) -     Ambulatory referral to Psychology Mixed anxiety and depressive disorder -     Ambulatory referral to Psychology Mild intermittent asthma without complication -     albuterol (VENTOLIN HFA) 108 (90 Base) MCG/ACT inhaler; Inhale 2 puffs into the lungs every 6 (six) hours as needed for wheezing or shortness of breath. 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 Central adiposity Acne, unspecified acne type -     tretinoin (RETIN-A) 0.025 % cream; Apply topically at bedtime.       Additional notes: This document was synthesized by artificial intelligence (Abridge) using HIPAA-compliant recording of the clinical interaction;   We discussed the use of AI scribe software for clinical note transcription with the patient, who gave verbal consent to proceed.    Additional Info: This encounter employed state-of-the-art, real-time, collaborative documentation. The patient actively reviewed and assisted in updating their electronic medical record on a shared screen, ensuring transparency and facilitating joint problem-solving for the problem list, overview, and plan. This approach promotes accurate, informed care. The treatment plan was discussed and reviewed in detail, including medication safety, potential side effects, and all patient  questions. We confirmed understanding and comfort with the plan. Follow-up instructions were established, including contacting the office for any concerns, returning if symptoms worsen, persist, or new symptoms develop, and precautions for potential emergency department visits.  Initial Appointment Goals:  This initial visit focused on establishing a foundation for the patient's care. We collaboratively reviewed her medical history and medications in detail, updating the chart as shown in the encounter. Given the extensive information, we prioritized addressing her most pressing concerns, which she reported were: Transfer of Care (Transfer of Care/Discuss therapy referral, does not want to see psychiatry)  While the complexity of the patient's medical picture may necessitate further evaluation in subsequent visits, we were able to develop a preliminary care plan together. To expedite a comprehensive plan at the next visit, we encouraged the patient to gather relevant medical records from previous providers. This collaborative approach will ensure a more complete understanding of the patient's health and inform the development of a personalized care plan. We look forward to continuing the conversation and working together with the patient on achieving her health goals.   Collaborative Documentation:  Today's encounter utilized real-time, dynamic patient engagement.  Patients actively participate by directly reviewing and assisting in updating their medical records through a shared screen. This transparency empowers patients to visually confirm chart updates made by the healthcare provider.  This collaborative approach facilitates problem management as we jointly update the problem list, problem overview, and assessment/plan. Ultimately, this process enhances chart accuracy and completeness, fostering  shared decision-making, patient education, and informed consent for tests and treatments.  Collaborative  Treatment Planning:  Treatment plans were discussed and reviewed in detail.  Explained medication safety and potential side effects.  Encouraged participation and answered all patient questions, confirming understanding and comfort with the plan. Encouraged patient to contact our office if they have any questions or concerns. Agreed on patient returning to office if symptoms worsen, persist, or new symptoms develop.  ----------------------------------------------------- Lula Olszewski, MD  04/22/2023 8:39 PM   [facility]:  [phone]

## 2023-04-22 NOTE — Assessment & Plan Note (Signed)
 Assessment: Persistent pain following hysterectomy performed in September 2023 for fibroids causing anemia. Pain improved but still present. Plan: Continue current pain management approach Discussed relationship between anxiety/depression and chronic pain Consider physical therapy referral if pain persists Reassess at next visit

## 2023-04-22 NOTE — Assessment & Plan Note (Signed)
 Assessment: Post-traumatic stress symptoms related to past traumas and surgical experience. Currently managed without medication. Symptoms impact daily functioning with associated sleep disturbance including night sweats. Plan: Referral to psychology for trauma-focused therapy Specifically discussed benefits of EMDR therapy for PTSD symptoms Provided education on trauma response and symptom management Consider medication options if symptoms worsen Follow-up on symptom progress at next visit

## 2023-04-22 NOTE — Assessment & Plan Note (Signed)
 Resolved Assessment: History of anemia due to fibroids causing heavy bleeding. Current hemoglobin normal at 13.6 g/dL with normal iron studies. Condition resolved following hysterectomy in 2023. Plan: No active management needed for resolved condition Monitor CBC with annual labs Document resolution of previously diagnosed condition

## 2023-04-23 ENCOUNTER — Other Ambulatory Visit: Payer: Self-pay

## 2023-04-30 NOTE — Telephone Encounter (Signed)
 Copied from CRM [id number]. Topic: Clinical - Medication Question >> Apr 28, 2023  3:29 PM Almira Coaster wrote: Reason for CRM: Patient is calling to follow up with Dr.Morrison, she would like to start the  (specifically Mounjaro/tirzepatide) per Dr.Morrison recommendation. She would like to ask a few questions prior but feels like she is up to start. Best call back number is [phone].

## 2023-05-02 MED ORDER — TIRZEPATIDE 2.5 MG/0.5ML ~~LOC~~ SOAJ
2.5000 mg | SUBCUTANEOUS | 11 refills | Status: DC
Start: 2023-05-02 — End: 2023-06-09
  Filled 2023-05-02: qty 2, 28d supply, fill #0

## 2023-05-03 ENCOUNTER — Encounter: Payer: Self-pay | Admitting: Family

## 2023-05-03 ENCOUNTER — Encounter (HOSPITAL_BASED_OUTPATIENT_CLINIC_OR_DEPARTMENT_OTHER): Payer: Self-pay

## 2023-05-03 ENCOUNTER — Other Ambulatory Visit (HOSPITAL_BASED_OUTPATIENT_CLINIC_OR_DEPARTMENT_OTHER): Payer: Self-pay

## 2023-05-04 ENCOUNTER — Other Ambulatory Visit (HOSPITAL_COMMUNITY): Payer: Self-pay

## 2023-05-04 ENCOUNTER — Telehealth: Payer: Self-pay

## 2023-05-04 NOTE — Telephone Encounter (Signed)
 Pharmacy Patient Advocate Encounter   Received notification from Physician's Office that prior authorization for Mounjaro 2.5MG /0.5ML auto-injectors is required/requested.   Insurance verification completed.   The patient is insured through [facility] .   Per test claim: PA required; PA submitted to above mentioned insurance via CoverMyMeds Key/confirmation #/EOC [facility] Status is pending

## 2023-05-05 NOTE — Telephone Encounter (Signed)
 Pharmacy Patient Advocate Encounter  Received notification from [facility] that Prior Authorization for [facility] 2.5MG /0.5ML auto-injectors has been DENIED.  Full denial letter will be uploaded to the media tab. See denial reason below.  Here are the policy requirements your request did not meet:  The request does not the [facility] Preferred Drug List (PDL) trial and failure criteria for the use of a non-referred drug. To meet the criteria for approval, you must try two of the following preferred drugs on the [employer] Preferred Drug List (PDL) (this is a list of covered drugs): Byetta, Trulicity, Ozempic, Victoza  PA #/Case ID/Reference #: [id number]

## 2023-05-06 ENCOUNTER — Encounter: Payer: Self-pay | Admitting: Internal Medicine

## 2023-05-06 NOTE — Telephone Encounter (Signed)
 Patient stated she would like more information sent through mychart because she doesn't know much about preferred medications.

## 2023-05-07 ENCOUNTER — Other Ambulatory Visit (HOSPITAL_BASED_OUTPATIENT_CLINIC_OR_DEPARTMENT_OTHER): Payer: Self-pay

## 2023-05-07 ENCOUNTER — Other Ambulatory Visit: Payer: Self-pay | Admitting: *Deleted

## 2023-05-07 ENCOUNTER — Encounter: Payer: Self-pay | Admitting: Family

## 2023-05-07 DIAGNOSIS — E1165 Type 2 diabetes mellitus with hyperglycemia: Secondary | ICD-10-CM

## 2023-05-07 MED ORDER — ACCU-CHEK GUIDE TEST VI STRP
ORAL_STRIP | 12 refills | Status: AC
Start: 2023-05-07 — End: ?
  Filled 2023-05-07: qty 100, 90d supply, fill #0
  Filled 2023-08-03: qty 100, 90d supply, fill #1
  Filled 2023-11-12: qty 100, 90d supply, fill #2
  Filled 2024-02-17: qty 100, 90d supply, fill #3

## 2023-05-07 MED ORDER — ACCU-CHEK SOFTCLIX LANCETS MISC
12 refills | Status: AC
Start: 2023-05-07 — End: ?
  Filled 2023-05-07: qty 100, 90d supply, fill #0
  Filled 2023-08-03: qty 100, 90d supply, fill #1
  Filled 2023-11-12: qty 100, 90d supply, fill #2
  Filled 2024-02-17: qty 100, 90d supply, fill #3

## 2023-05-07 MED ORDER — ACCU-CHEK GUIDE W/DEVICE KIT
PACK | 1 refills | Status: AC
Start: 2023-05-07 — End: ?
  Filled 2023-05-07: qty 1, 1d supply, fill #0

## 2023-05-08 ENCOUNTER — Other Ambulatory Visit (HOSPITAL_BASED_OUTPATIENT_CLINIC_OR_DEPARTMENT_OTHER): Payer: Self-pay

## 2023-05-10 ENCOUNTER — Other Ambulatory Visit (HOSPITAL_BASED_OUTPATIENT_CLINIC_OR_DEPARTMENT_OTHER): Payer: Self-pay

## 2023-05-10 ENCOUNTER — Ambulatory Visit: Admitting: Internal Medicine

## 2023-05-10 ENCOUNTER — Encounter: Payer: Self-pay | Admitting: Internal Medicine

## 2023-05-10 VITALS — BP 120/64 | HR 68 | Temp 97.8°F | Ht 64.0 in | Wt 158.4 lb

## 2023-05-10 DIAGNOSIS — E1165 Type 2 diabetes mellitus with hyperglycemia: Secondary | ICD-10-CM

## 2023-05-10 DIAGNOSIS — F418 Other specified anxiety disorders: Secondary | ICD-10-CM | POA: Diagnosis not present

## 2023-05-10 DIAGNOSIS — Z7985 Long-term (current) use of injectable non-insulin antidiabetic drugs: Secondary | ICD-10-CM

## 2023-05-10 DIAGNOSIS — F4310 Post-traumatic stress disorder, unspecified: Secondary | ICD-10-CM | POA: Diagnosis not present

## 2023-05-10 DIAGNOSIS — Z780 Asymptomatic menopausal state: Secondary | ICD-10-CM

## 2023-05-10 DIAGNOSIS — D72819 Decreased white blood cell count, unspecified: Secondary | ICD-10-CM

## 2023-05-10 DIAGNOSIS — R110 Nausea: Secondary | ICD-10-CM

## 2023-05-10 DIAGNOSIS — E559 Vitamin D deficiency, unspecified: Secondary | ICD-10-CM

## 2023-05-10 DIAGNOSIS — Z7984 Long term (current) use of oral hypoglycemic drugs: Secondary | ICD-10-CM

## 2023-05-10 DIAGNOSIS — R350 Frequency of micturition: Secondary | ICD-10-CM | POA: Insufficient documentation

## 2023-05-10 LAB — VITAMIN D 25 HYDROXY (VIT D DEFICIENCY, FRACTURES): VITD: 29.53 ng/mL — ABNORMAL LOW (ref 30.00–100.00)

## 2023-05-10 LAB — COMPREHENSIVE METABOLIC PANEL WITH GFR
ALT: 11 U/L (ref 0–35)
AST: 12 U/L (ref 0–37)
Albumin: 4.5 g/dL (ref 3.5–5.2)
Alkaline Phosphatase: 61 U/L (ref 39–117)
BUN: 15 mg/dL (ref 6–23)
CO2: 29 meq/L (ref 19–32)
Calcium: 9.7 mg/dL (ref 8.4–10.5)
Chloride: 100 meq/L (ref 96–112)
Creatinine, Ser: 0.77 mg/dL (ref 0.40–1.20)
GFR: 90.49 mL/min (ref >60.00)
Glucose, Bld: 347 mg/dL — ABNORMAL HIGH (ref 70–99)
Potassium: 4.3 meq/L (ref 3.5–5.1)
Sodium: 138 meq/L (ref 135–145)
Total Bilirubin: 1.3 mg/dL — ABNORMAL HIGH (ref 0.2–1.2)
Total Protein: 7.5 g/dL (ref 6.0–8.3)

## 2023-05-10 LAB — B12 AND FOLATE PANEL
Folate: >25.2 ng/mL (ref >5.9)
Vitamin B-12: >1537 pg/mL — ABNORMAL HIGH (ref 211–911)

## 2023-05-10 LAB — CBC WITH DIFFERENTIAL/PLATELET
Basophils Absolute: 0.1 10*3/uL (ref 0.0–0.1)
Basophils Relative: 1.5 % (ref 0.0–3.0)
Eosinophils Absolute: 0.3 10*3/uL (ref 0.0–0.7)
Eosinophils Relative: 8 % — ABNORMAL HIGH (ref 0.0–5.0)
HCT: 44.2 % (ref 36.0–46.0)
Hemoglobin: 15.1 g/dL — ABNORMAL HIGH (ref 12.0–15.0)
Lymphocytes Relative: 44.6 % (ref 12.0–46.0)
Lymphs Abs: 2 10*3/uL (ref 0.7–4.0)
MCHC: 34.2 g/dL (ref 30.0–36.0)
MCV: 86.3 fl (ref 78.0–100.0)
Monocytes Absolute: 0.3 10*3/uL (ref 0.1–1.0)
Monocytes Relative: 7.3 % (ref 3.0–12.0)
Neutro Abs: 1.7 10*3/uL (ref 1.4–7.7)
Neutrophils Relative %: 38.6 % — ABNORMAL LOW (ref 43.0–77.0)
Platelets: 205 10*3/uL (ref 150.0–400.0)
RBC: 5.12 Mil/uL — ABNORMAL HIGH (ref 3.87–5.11)
RDW: 13.6 % (ref 11.5–15.5)
WBC: 4.4 10*3/uL (ref 4.0–10.5)

## 2023-05-10 LAB — MICROALBUMIN / CREATININE URINE RATIO
Creatinine,U: 111.8 mg/dL
Microalb Creat Ratio: 9.1 mg/g (ref 0.0–30.0)
Microalb, Ur: 1 mg/dL (ref 0.0–1.9)

## 2023-05-10 LAB — LIPID PANEL
Cholesterol: 157 mg/dL (ref 0–200)
HDL: 47.6 mg/dL (ref >39.00)
LDL Cholesterol: 93 mg/dL (ref 0–99)
NonHDL: 109.51
Total CHOL/HDL Ratio: 3
Triglycerides: 85 mg/dL (ref 0.0–149.0)
VLDL: 17 mg/dL (ref 0.0–40.0)

## 2023-05-10 LAB — HEMOGLOBIN A1C: Hgb A1c MFr Bld: 11.4 % — ABNORMAL HIGH (ref 4.6–6.5)

## 2023-05-10 MED ORDER — METFORMIN HCL 500 MG PO TABS
500.0000 mg | ORAL_TABLET | Freq: Two times a day (BID) | ORAL | 3 refills | Status: DC
  Filled 2023-05-10: qty 180, 90d supply, fill #0

## 2023-05-10 MED ORDER — OZEMPIC (0.25 OR 0.5 MG/DOSE) 2 MG/3ML ~~LOC~~ SOPN
0.2500 mg | PEN_INJECTOR | SUBCUTANEOUS | 2 refills | Status: DC
Start: 2023-05-10 — End: 2023-06-09
  Filled 2023-05-10: qty 3, 28d supply, fill #0

## 2023-05-10 MED ORDER — ONDANSETRON 4 MG PO TBDP
4.0000 mg | ORAL_TABLET | Freq: Three times a day (TID) | ORAL | 2 refills | Status: AC | PRN
Start: 2023-05-10 — End: ?
  Filled 2023-05-10 (×2): qty 20, 7d supply, fill #0

## 2023-05-10 MED ORDER — ESCITALOPRAM OXALATE 10 MG PO TABS
10.0000 mg | ORAL_TABLET | Freq: Every day | ORAL | 1 refills | Status: DC
  Filled 2023-05-10 (×2): qty 30, 30d supply, fill #0
  Filled 2023-12-02: qty 30, 30d supply, fill #1

## 2023-05-10 MED ORDER — GLIPIZIDE 5 MG PO TABS
5.0000 mg | ORAL_TABLET | Freq: Two times a day (BID) | ORAL | 3 refills | Status: DC
  Filled 2023-05-10: qty 60, 30d supply, fill #0

## 2023-05-10 MED ORDER — ONDANSETRON 4 MG PO TBDP
4.0000 mg | ORAL_TABLET | Freq: Four times a day (QID) | ORAL | 0 refills | Status: DC | PRN
  Filled 2023-05-10: qty 20, 5d supply, fill #0

## 2023-05-10 MED ORDER — DAPAGLIFLOZIN PROPANEDIOL 5 MG PO TABS
5.0000 mg | ORAL_TABLET | Freq: Every day | ORAL | 3 refills | Status: AC
Start: 2023-05-10 — End: ?
  Filled 2023-05-10: qty 90, 90d supply, fill #0
  Filled 2023-08-03: qty 90, 90d supply, fill #1

## 2023-05-10 NOTE — Progress Notes (Signed)
 ==============================   [facility]: [phone]   -- Medical Office Visit --  Patient: [name]      Age: 48 y.o.       Sex:  female  Date:   05/10/2023 Today's Healthcare Provider: Lula Olszewski, MD  ==============================   Chief Complaint: Diabetes (Pt states she is having a lot of elevated blood sugars for the past week or so.States makes her feel differently at times when it is elevated. The highest close to 500 but got it down to 300. Pt states she might be checking at the wrong time.)  History of Present Illness 48 year old female with type 2 diabetes who presents with uncontrolled blood sugars.  She has significantly elevated blood sugar levels, ranging from 300 to 500 mg/dL, which she attributes to increased stress, poor dietary habits, and insufficient physical activity. She experiences frequent urination, which she believes is due to high blood sugar levels. She is not currently on any diabetes medication, having previously been on metformin and insulin but discontinued them. She has not yet chosen between other medications such as Tirzepatide or Ozempic.  She experiences significant stress due to work and personal issues, which she believes contributes to her poor dietary choices and elevated blood sugar levels. She engages in stress eating, particularly consuming ice cream sandwiches, and describes her eating habits as 'terrible.'  She has a history of mixed anxiety and depressive disorder, PTSD, and type 2 diabetes. She experiences symptoms of depression, including anhedonia, feeling down, poor appetite, fatigue, low energy, and poor concentration. She has had thoughts of self-harm in the past two weeks and describes her depression as making life 'extremely hard.'  She associates her history of depression with a past hysterectomy. She has tried medications like Lexapro and Wellbutrin in the past without significant  improvement. She has concerns about taking medications and prefers to manage her conditions with minimal medication use.   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  Reviewed: She   has a past medical history of Anemia, Asthma, Blood transfusion without reported diagnosis, Gestational diabetes, Postoperative anemia due to acute blood loss (10/09/2021), and Type 2 diabetes mellitus (HCC).  Manually updated the following problems: Problem  Leukopenia   Leukopenia (D72.819) #NewProblem #IncidentalFinding  STATUS: 05/10/2023: Low WBC noted during today's visit (exact value not documented). Identified as part of evaluation for uncontrolled diabetes. Patient does not report recent infections, fevers, or other symptoms suggestive of bone marrow suppression.  CLINICAL CONTEXT: 48 year old female with type 2 diabetes, major depression, PTSD, and vitamin D deficiency. Current medications include amphetamine-dextroamphetamine, albuterol, clindamycin gel, fluticasone nasal spray, tretinoin cream, and multiple vitamin supplements. Started today on metformin, glipizide, dapagliflozin, escitalopram, and ondansetron.  DIAGNOSTIC ASSESSMENT: ? Primary considerations (differential diagnosis): 1. Medication effect (30-40% probability): Potential reaction to current medications 2. Nutritional deficiency (20-30% probability): Vitamin B12, folate, copper deficiencies 3. Viral suppression (15-20% probability): Recent or current viral  infection 4. Benign ethnic neutropenia (10-15% probability): If patient has African ancestry 5. Primary hematologic disorder (<5% probability): Myelodysplastic syndrome, aplastic anemia ?? Full assessment pending comprehensive CBC with differential and additional workup  MANAGEMENT PLAN: ?? Diagnostic: CBC with differential ordered today ?? Additional testing: B12 and folate panel ordered ?? Medication review: Assessed current medications for potential bone marrow suppression ?? Consider: Peripheral blood smear if CBC shows significant abnormalities ?? Referral threshold: Will refer to hematology if WBC <3.0 or if neutrophil count <1.0  MONITORING: ?? Follow-up in 25 days with CBC results review ?? Short-term monitoring: Patient educated on fever monitoring and infection risk ??? Symptoms to report: Fever >100.37F, unexplained bruising, prolonged bleeding, fatigue ?? Long-term monitoring: Sequential CBCs at 1-3 month intervals if leukopenia persists  GUIDELINES: American Society of Hematology guidelines recommend step-wise evaluation of unexplained cytopenias. For mild-moderate leukopenia (WBC 2.0-3.5  10^9/L) without other cytopenias, observation with serial CBCs is appropriate in asymptomatic patients. Complete hematologic evaluation indicated for progressive leukopenia, associated cytopenias, or concerning clinical features.  ADDITIONAL CONSIDERATIONS: Patient's history of anemia noted (iron deficiency anemia due to chronic blood loss on problem list), though most recent CBC results not available in today's documentation. This history suggests potential for multiple cytopenias that would increase concern. Will need to assess historical trend of WBC counts once full records are available. Nutritional assessment particularly important given history of poor dietary habits and multiple deficiencies.     Postmenopausal Estrogen Deficiency  Urinary Frequency   Urinary Frequency (R35.0) #NewProblem  #AssociatedFinding  STATUS: 05/10/2023: Patient reports increased urinary frequency, which she attributes to elevated blood glucose levels (300-500 mg/dL). No prior documented history of urinary symptoms. No dysuria, hematuria, or other urinary complaints noted.  CLINICAL CONTEXT: 48 year old female with uncontrolled type 2 diabetes (recent hyperglycemia 300-500 mg/dL), severe depression (PHQ-9: 24), post-hysterectomy status (10/2021), and vitamin D deficiency. Starting dapagliflozin (SGLT2 inhibitor) today, which can affect urinary patterns. No documented UTI history or pelvic floor disorders in available records.  DIAGNOSTIC ASSESSMENT: ? Primary consideration (70-80% probability): Osmotic diuresis due to hyperglycemia ? Secondary considerations: 1. Urinary tract infection (10-15% probability): Given new onset urinary symptoms 2. Overactive bladder (5-10% probability): Common in women with history of pelvic surgery 3. Medication effect (5-10% probability): Review of current medications ?? Assessment pending urinalysis results and response to glycemic control  MANAGEMENT PLAN: ?? Diagnostic: Urinalysis with microscopic examination and reflex culture ordered today ?? Monitoring: Urine microalbumin/creatinine ratio to assess for diabetic nephropathy ?? Primary management: Aggressive glycemic control with metformin, glipizide, and pending semaglutide ?? Patient education: Explained expected urinary effects of SGLT2 inhibitor (dapagliflozin) ?? Fluid management: Advised on balanced hydration - adequate fluid intake without excessive consumption ?? Referral threshold: Will consider urology referral if symptoms persist after glycemic normalization  MONITORING: ?? Follow-up in 25 days to reassess urinary symptoms ?? Self-monitoring: Patient instructed to track frequency pattern and correlation with blood glucose ??? Warning signs: Educated on symptoms requiring urgent evaluation (dysuria, hematuria,  fever) ?? Long-term assessment: Monitor for UTI risk with SGLT2 inhibitor therapy  GUIDELINES: American Urological Association guidelines recommend initial evaluation with urinalysis for new urinary symptoms. For diabetic patients with hyperglycemia and polyuria, primary focus should be on glycemic control. When initiating SGLT2 inhibitors, patients should be monitored for genital mycotic infections and UTIs, with particular vigilance in women with risk factors.  ADDITIONAL CONSIDERATIONS: Osmotic diuresis from hyperglycemia is the most likely explanation for current symptoms, but initiating dapagliflozin (SGLT2 inhibitor) may further increase urinary frequency and UTI risk. Patient's post-hysterectomy status could potentially contribute to altered urinary function through anatomical changes. Close monitoring of urinary symptoms with new medication regimen is essential. Consider specialized pelvic floor assessment if symptoms persist after  glycemic normalization.     Ptsd (Post-Traumatic Stress Disorder)   Post-Traumatic Stress Disorder (F43.10) #Chronic #ContributingFactor  STATUS: 05/10/2023: Ongoing PTSD symptoms related to past trauma and surgical experiences (hysterectomy 10/2021). Likely contributing to severe depression exacerbation (PHQ-9: 24) and difficulties with medical condition self-management. Previous referral to psychology for trauma-focused therapy not yet completed.  CLINICAL CONTEXT: 48 year old female with major depression, uncontrolled diabetes, chronic post-surgical pain, and attention difficulties. Reports sleep disturbance including night sweats. Has expressed preference for non-pharmacological management approaches. Currently on amphetamine-dextroamphetamine 5mg  daily for attention issues.  DIAGNOSTIC ASSESSMENT: ? Primary diagnosis: PTSD related to multiple traumas with significant impact on daily functioning ? Secondary considerations: Complex trauma with both pre-surgical and  surgical traumatic experiences ?? Complications: Likely contributing to medication adherence challenges and depression severity ? Assessment needed: Structured trauma assessment would be beneficial for treatment planning  MANAGEMENT PLAN: ?? Specialty care: Reinforced previous referral to psychology for trauma-focused therapy ?? Specific intervention: EMDR therapy recommended as evidence-based approach for trauma ?? Medication consideration: SSRI initiated for depression may have beneficial effects on PTSD symptoms ?? Patient education: Provided information on trauma responses and relationship to physical symptoms ?? Coping strategies: Reviewed grounding techniques for managing acute distress  MONITORING: ?? Follow-up in 25 days for reassessment of overall mental health status ?? Symptom tracking: Focus on sleep quality, intrusive thoughts, and hypervigilance ??? Assess: Progress with referral completion and therapy engagement ?? Monitor: Impact of PTSD symptoms on diabetes self-management  GUIDELINES: Following American Psychological Association Clinical Practice Guidelines for PTSD. Trauma-focused psychotherapies (including EMDR, CPT, and prolonged exposure) are recommended as first-line treatments. SSRIs (sertraline, paroxetine, fluoxetine) and SNRIs (venlafaxine) are recommended as first-line pharmacotherapy. Combined approach often beneficial for complex presentations.  ADDITIONAL CONSIDERATIONS: PTSD symptoms likely exacerbated by current life stressors and may be contributing significantly to poor diabetes control through physiological stress responses and behavioral avoidance. Patient has expressed reservations about [facility] psychiatric evaluation process for accessing therapy. Working to identify alternative therapy options while maintaining focus on evidence-based trauma interventions. Consider exploring trauma-informed approach to diabetes management.     Type 2 Diabetes Mellitus With  Hyperglycemia, Without Long-Term Current Use of Insulin (Hcc)   Type 2 Diabetes with Hyperglycemia (E11.65) #AcuteDecompensation #FollowUp  STATUS: 05/10/2023: Patient reports severely elevated home glucose readings (300-500 mg/dL) for past week. Previously well-controlled with A1c improved from 11.0% (12/2021) to 7.0% (01/2023) through dietary management. Patient has discontinued taking prescribed tirzepatide.  CLINICAL CONTEXT: 48 year old female with severe depression (PHQ-9: 24), PTSD, vitamin D deficiency, and chronic pain post-hysterectomy. Reports significant psychosocial stressors affecting self-care. Recent weight loss trend (171 lbs ? 158 lbs) over past 2 months. No evidence of diabetic nephropathy (microalbumin/creatinine ratio normal 03/2023) or retinopathy (last exam 12/2021).  DIAGNOSTIC ASSESSMENT: ? Primary consideration (80-90% probability): Glycemic decompensation due to psychosocial stressors and poor dietary adherence ? Secondary considerations: Medication non-adherence (discontinued tirzepatide), progression of underlying disease ?? Contributing factors: Severe depression (PHQ-9 score deteriorated from 13 to 24), self-reported "terrible" eating habits with stress eating ? Risk stratification: Moderate risk for complications given prior good control but acute severe hyperglycemia  MANAGEMENT PLAN: ?? Glycemic control: Initiated metformin 500mg  BID with meals + glipizide 5mg  BID before meals due to insurance delay with semaglutide (Ozempic) ?? Renal protection: Added dapagliflozin Marcelline Deist) 5mg  daily before breakfast ?? Medication: Submitted prior authorization for semaglutide (Ozempic) 0.25mg  weekly ?? Symptom management: Ondansetron 4mg  PRN for anticipated GI side effects ?? Lifestyle modifications: Structured meal planning focusing on consistent carbohydrate timing and portion  control ?? Integrated care: Coordinated approach with depression management (started escitalopram) given  clear bidirectional relationship  MONITORING: ?? Follow-up in 25 days to assess medication response and adjust therapy ?? Lab monitoring: A1c, comprehensive metabolic panel, microalbumin/creatinine ratio, CBC with differential ordered ??? Self-monitoring: Daily glucose monitoring with emphasis on fasting and 2-hour postprandial values ?? Hypoglycemia precautions: Education provided on sulfonylurea therapy risks and hypoglycemia management  GUIDELINES: Per American Diabetes Association (ADA) Standards of Care (2025), multidrug therapy is recommended for patients with A1c >7%. Combined metformin + SGLT2 inhibitor + sulfonylurea is an acceptable regimen when GLP-1 RAs are not immediately available. Coordinating diabetes and depression care improves outcomes for both conditions.  ADDITIONAL CONSIDERATIONS: Bidirectional relationship between worsening depression (PHQ-9 increased from 13 to 24) and diabetes self-care. Patient has prior history of gestational diabetes and family history of diabetes. Upcoming specialty evaluations: ophthalmology eye exam due 12/2022 and previously scheduled mental health appointment. Low WBC noted during visit - baseline CBC ordered for monitoring.      Mixed Anxiety and Depressive Disorder   Major Depressive Disorder, Severe (F32.2) #Acute #SuicidalIdeation  STATUS: 05/10/2023: Critical deterioration in depression severity with PHQ-9 score sharply increased from 13 (moderate) to 24 (severe). New onset of active suicidal ideation. Previously diagnosed as mixed anxiety and depressive disorder, now meets criteria for severe major depression.  CLINICAL CONTEXT: 48 year old female with uncontrolled diabetes, PTSD related to past trauma and surgical experiences, chronic pain post-hysterectomy, and attention difficulties. Reports significant work and personal stressors. Failed trials of Lexapro and Wellbutrin in the past. Preference for non-pharmacological interventions noted  previously.  DIAGNOSTIC ASSESSMENT: ? Primary diagnosis: Major depressive disorder, severe episode (PHQ-9: 24/27) with suicidal ideation (score 2/3 on item 9) ? Secondary considerations: PTSD with depression, adjustment disorder with depressed mood ?? Risk factors: History of trauma, chronic medical conditions, medication non-adherence pattern, social isolation ? Protective factors: Insight into condition, willingness to engage in treatment during today's visit, supportive family (daughter mentioned)  MANAGEMENT PLAN: ?? Pharmacotherapy: Initiated escitalopram (Lexapro) 10mg  daily with gradual titration (5mg  daily for first 2 weeks) ?? Safety planning: Completed comprehensive suicide risk assessment with no immediate risk identified ?? Referral: Reinforced previous referral to psychology for trauma-focused therapy, specifically EMDR ?? Workplace accommodation: Offered FMLA paperwork completion for potential medical leave ?? Patient education: Provided education on depression-diabetes bidirectional relationship ?? Crisis resources: Provided information on [facility] for emergencies  MONITORING: ?? Close follow-up in 25 days for medication response assessment and safety monitoring ?? Depression monitoring: Repeat PHQ-9 at follow-up visit ??? Side effect monitoring: Education provided on potential SSRI side effects ?? Treatment expectations: Discussion of typical timeline for SSRI response (2-4 weeks for initial effects)  GUIDELINES: Following American Psychiatric Association (APA) guidelines for MDD management. For severe depression with suicidal ideation, combination of pharmacotherapy and psychotherapy is recommended. SSRI is appropriate first-line agent despite previous trial given clinical severity. Patient did not meet criteria for involuntary hospitalization per assessment using [place] Suicide Severity Rating Scale framework.  ADDITIONAL CONSIDERATIONS:  Complex case with multiple interacting conditions. Depression severity likely contributing to diabetes decompensation through impact on self-care behaviors. Previous hesitance about psychiatric medications considered but severity warranted medication initiation. Gradual dose titration approach selected to minimize side effects and improve adherence likelihood. Current Adderall XR prescription may need reassessment in context of depression treatment.       03/17/2023    4:47 PM 11/10/2022    4:23 PM 06/26/2022    1:50 PM  Depression screen  PHQ 2/9  Decreased Interest 2 2 2   Down, Depressed, Hopeless 3 2 2   PHQ - 2 Score 5 4 4   Altered sleeping 0 0 1  Tired, decreased energy 1 1 1   Change in appetite 1 0 1  Feeling bad or failure about yourself  3 3 0  Trouble concentrating 1 3 0  Moving slowly or fidgety/restless 0 0 0  Suicidal thoughts 2 0 0  PHQ-9 Score 13 11 7   Difficult doing work/chores Somewhat difficult Extremely dIfficult Somewhat difficult       Vitamin D Deficiency   Vitamin D Deficiency (E55.9) #Chronic #Contributory  STATUS: 05/10/2023: Long-standing vitamin D deficiency with inconsistent supplementation adherence. Last documented in 04/2023 clinical note with reported symptoms of bone pain, muscle weakness, and fatigue. No recent 25-OH vitamin D levels available for assessment. Previously prescribed supplementation not being taken consistently.  CLINICAL CONTEXT: 48 year old female with severe depression, uncontrolled type 2 diabetes, and chronic pain. Risk factors include limited sun exposure, poor dietary vitamin D intake, and self-reported medication adherence challenges. Symptoms potentially overlapping with depression manifestations. Calcium levels consistently within normal range (most recent 8.9 mg/dL on 40/9811).  DIAGNOSTIC ASSESSMENT: ? Primary consideration: Vitamin D deficiency with likely contribution to overall symptom burden ? Secondary considerations: Symptom  overlap with depression and other conditions ? Labs pending: 25-OH vitamin D level ordered today ? Normal findings: Serum calcium consistently normal (range: 8.9-9.7 mg/dL over past year), alkaline phosphatase 46 (01/2023)  MANAGEMENT PLAN: ?? Supplementation: Reinforced importance of consistent vitamin D3 5,000 IU daily supplementation ?? Diagnostic: Ordered 25-OH vitamin D level ?? Dietary counseling: Encouraged vitamin D-rich foods (fatty fish, fortified dairy) ?? Lifestyle modifications: Recommended 15-30 minutes of sun exposure 2-3 times weekly when possible ?? Adherence support: Connected vitamin D supplementation to overall wellbeing and depression management  MONITORING: ?? Follow-up in 25 days with lab result review ?? Laboratory monitoring: 25-OH vitamin D level, calcium, phosphorus ??? Symptom monitoring: Tracking of bone pain, muscle strength, fatigue in context of overall condition ?? Long-term monitoring: Consider repeat testing in 3-6 months after supplementation initiated  GUIDELINES: Following Endocrine Society Clinical Practice Guidelines. For vitamin D deficiency, recommended replacement dose is 50,000 IU weekly or 5,000-7,000 IU daily for 8 weeks to achieve serum 25-OH vitamin D level >30 ng/mL, followed by maintenance therapy of 1,500-2,000 IU daily. Higher doses may be required in obesity, malabsorption, and medication interactions.  ADDITIONAL CONSIDERATIONS: Vitamin D deficiency may be contributing to or exacerbating depression symptoms. Research shows bidirectional relationship between vitamin D status and mood disorders. Given patient's age (35), not yet at typical DEXA screening age (42+) with no other identified osteoporosis risk factors. No family history of osteoporosis noted, TSH normal (1.61 on 12/2021). Consider integrating supplementation adherence with diabetes medication regimen for improved compliance.    No results found for: "VD25OH" Lab Results  Component  Value Date   TSH 1.61 12/05/2021   ALKPHOS 46 01/26/2023   CALCIUM 8.9 01/26/2023   CAION 1.28 12/05/2021   Lab Results  Component Value Date   CALCIUM 8.9 01/26/2023   CALCIUM 9.6 08/11/2022   CALCIUM 9.2 03/12/2022   CALCIUM 9.7 01/12/2022   CALCIUM 9.2 12/06/2021   No known family history osteoporosis, no thyroid problem... so DEXA not needed until 58      Reviewed:  Allergies as of 05/10/2023 - Review Complete 05/10/2023  Allergen Reaction Noted   Shellfish allergy Itching and Swelling 05/02/2012   Other Itching and Swelling 05/02/2012   Latex  Itching, Rash, and Other (See Comments) 08/13/2020    Medications: Reviewed: Current Outpatient Medications on File Prior to Visit  Medication Sig   Accu-Chek Softclix Lancets lancets Use as instructed   albuterol (VENTOLIN HFA) 108 (90 Base) MCG/ACT inhaler Inhale 2 puffs into the lungs every 6 (six) hours as needed for wheezing or shortness of breath.   amphetamine-dextroamphetamine (ADDERALL XR) 5 MG 24 hr capsule Take 1 capsule (5 mg total) by mouth daily.   Blood Glucose Monitoring Suppl (ACCU-CHEK GUIDE) w/Device KIT Use to test blood sugar daily   Calcium Carb-Cholecalciferol (CALCIUM + D3 PO) Take 1 tablet by mouth daily. When able to remember   cetirizine (ZYRTEC ALLERGY) 10 MG tablet Take 1 tablet (10 mg total) by mouth daily.   Cholecalciferol (VITAMIN D3) 125 MCG (5000 UT) CAPS Take 1 capsule (5,000 Units total) by mouth daily.   clindamycin (CLINDAGEL) 1 % gel Apply topically 2 (two) times daily.   Cyanocobalamin (B-12) 1000 MCG TBCR Take 1,000 mcg by mouth daily. When able to remember   fluticasone (FLONASE) 50 MCG/ACT nasal spray Place 1 spray into both nostrils in the morning and at bedtime.   glucose blood (ACCU-CHEK GUIDE TEST) test strip Use to test blood sugar daily   glucose blood (ACCU-CHEK GUIDE) test strip daily   glucose blood test strip Use as instructed   Lancets (FREESTYLE) lancets Use as instructed    Saline (SIMPLY SALINE) 0.9 % AERS Place 1 spray into the nose daily at 6 (six) AM.   tretinoin (RETIN-A) 0.025 % cream Apply topically at bedtime.   tirzepatide [facility]) 2.5 MG/0.5ML Pen Inject 2.5 mg into the skin once a week. (Patient not taking: Reported on 05/10/2023)   No current facility-administered medications on file prior to visit.   Medications Discontinued During This Encounter  Medication Reason   ibuprofen (ADVIL) 600 MG tablet Completed Course   ondansetron (ZOFRAN-ODT) 4 MG disintegrating tablet Reorder         Physical Exam:    05/10/2023    1:33 PM 04/22/2023    1:22 PM 03/17/2023    3:59 PM  Vitals with BMI  Height 5\' 4"  5\' 4"  5\' 4"   Weight 158 lbs 6 oz 167 lbs 4 oz 171 lbs 4 oz  BMI 27.18 28.69 29.38  Systolic 120 124 161  Diastolic 64 81 81  Pulse 68 69 73   Wt Readings from Last 10 Encounters:  No data found for Wt  Vital signs reviewed.  Nursing notes reviewed. Weight trend reviewed. Physical Exam  Physical Exam  tearful  General Appearance:  No acute distress appreciable.   Well-groomed, healthy-appearing female.  Well proportioned with no abnormal fat distribution.  Good muscle tone. Pulmonary:  Normal work of breathing at rest, no respiratory distress apparent. SpO2: 98 %  Musculoskeletal: All extremities are intact.  Neurological:  Awake, alert, oriented, and engaged.  No obvious focal neurological deficits or cognitive impairments.  Sensorium seems unclouded.   Speech is clear and coherent with logical content. Psychiatric:  Appropriate mood, pleasant and cooperative demeanor, thoughtful and engaged during the exam     05/10/2023    1:47 PM 03/17/2023    4:47 PM 11/10/2022    4:23 PM  PHQ9 SCORE ONLY  PHQ-9 Total Score 24 13 11       05/10/2023    1:47 PM 03/17/2023    4:47 PM 11/10/2022    4:23 PM  Depression screen PHQ 2/9  Decreased Interest 3 2 2  Down, Depressed, Hopeless 3 3 2   PHQ - 2 Score 6 5 4   Altered sleeping 1 0 0  Tired,  decreased energy 3 1 1   Change in appetite 3 1 0  Feeling bad or failure about yourself  3 3 3   Trouble concentrating 3 1 3   Moving slowly or fidgety/restless 3 0 0  Suicidal thoughts 2 2 0  PHQ-9 Score 24 13 11   Difficult doing work/chores Extremely dIfficult Somewhat difficult Extremely dIfficult        No results found for any visits on 05/10/23. No visits with results within 1 Year(s) from this visit.  Latest known visit with results is:  No results found for any previous visit.  No image results found. No results found.    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 This represents critical clinical deterioration requiring immediate intervention. Diagnostic criteria for major depressive disorder (vs. previous mixed anxiety-depression diagnosis) now clearly met with 9/9 symptoms endorsed: depressed mood, anhedonia, sleep disturbance, fatigue/energy loss, appetite changes, worthlessness/guilt, concentration difficulties, psychomotor changes, and suicidal ideation.  SUICIDE RISK ASSESSMENT: Patient reports suicidal thoughts "in the past two weeks" (score 2/3 on PHQ-9 item 9). Comprehensive suicide risk assessment performed using [place]-Suicide Severity Rating Scale (C-SSRS) framework:  Ideation: Passive suicidal thoughts without specific plan or intent (severity level 3/5)  Behavior: No history of suicide attempts  Acute risk factors: Severe depression, diabetes decompensation, work/personal stressors  Chronic risk factors: PTSD, chronic pain, history of trauma  Protective factors: Insight into  condition, willing to engage in treatment, daughter support, expressed future desires despite symptoms  Overall risk assessment: Moderate risk without immediate safety concerns that would necessitate hospitalization  SYMPTOM ANALYSIS:  Duration: Chronic depression with acute exacerbation over past 2 weeks  Pattern: Progressive worsening in context of work/personal stressors and diabetes decompensation  Functional impact: Patient describes daily activities as "extremely difficult," significantly impaired occupational functioning  Associated symptoms: Tearfulness noted during examination, psychomotor retardation, impaired concentration  CONTRIBUTING FACTORS:  Biological: History of postmenopausal estrogen deficiency, vitamin D deficiency, uncontrolled diabetes  Psychological: Untreated PTSD symptoms, persistent post-surgical pain, negative thought patterns  Social: Work stressors, unclear family/social support system beyond daughter  Medication: Current amphetamine-dextroamphetamine may contribute to mood lability  TREATMENT HISTORY: Previous trials of Lexapro and Wellbutrin reported as ineffective, though dosing, duration, and adherence details unclear. Previous referral to psychology not completed. Preference for non-pharmacological interventions noted previously, but now demonstrates increased receptiveness to medication given symptom severity.  DIFFERENTIAL DIAGNOSIS:  Primary (70%): Major depressive disorder, severe episode with suicidal features  Secondary (20%): PTSD with depression as primary manifestation  Secondary (5%): Adjustment disorder with depressed mood related to medical illness  Consider (5%): Bipolar II disorder (would require further assessment for hypomanic episodes)  COMPREHENSIVE MANAGEMENT PLAN: PHARMACOTHERAPY RATIONALE:  Selected agent: Escitalopram (Lexapro) 10 mg daily with gradual titration (5 mg daily for first 2 weeks)  Mechanism: Selective serotonin reuptake  inhibitor (SSRI) with favorable efficacy-to-side-effect profile  Evidence base: Meta-analyses indicate 50-60% response rate for escitalopram in MDD with number needed to treat (NNT) of 6-8 compared to placebo  Rationale for retrial: Previous trial details unclear; structured approach with gradual titration may improve outcomes  SSRI selection  factors: Favorable anxiolytic properties, limited drug interactions with diabetes medications, established efficacy in comorbid depression-diabetes  PSYCHOTHERAPY APPROACH:  Primary recommendation: Trauma-focused therapy with emphasis on EMDR for addressing underlying PTSD  Interim coping strategies: Provided basic cognitive-behavioral techniques for managing negative thoughts and behavioral activation approaches  Referral status: Reinforced previous psychology referral with specific request for trauma-focused therapy  Alternative options: Exploring non-[facility] providers as per patient preference  SAFETY PLANNING:  Crisis resources: Provided [facility] information  Emergency contacts: Identified daughter as support person; confirmed contact information  Suicide safety plan: Developed written plan identifying warning signs, coping strategies, social supports, crisis numbers  Agreement: Patient verbally contracted for safety with commitment to utilize resources if thoughts worsen  DECISION TREE FOR TREATMENT ADJUSTMENTS:  Week 2: Assess medication tolerance; increase to target 10mg  if tolerated  Week 4 (25-day follow-up): Complete reassessment with PHQ-9; assess suicide risk  If minimal improvement at 4 weeks: Consider dose increase to 15-20mg  or augmentation strategies  If worsening/emergency: Immediate psychiatric assessment/hospitalization  If partially improved at 8-12 weeks: Consider adjunctive therapy (bupropion, psychotherapy intensification)  FUNCTIONAL REHABILITATION:  Occupational: Offered FMLA paperwork completion  for potential medical leave  Daily structure: Developed basic activity schedule focusing on one accomplishable task daily  Sleep hygiene: Provided specific strategies for improving sleep quality  Physical activity: Recommended 5-10 minute walks for mood benefit integrated with diabetes management  MONITORING PLAN:  Symptom tracking: Weekly PHQ-9 self-assessments with attention to suicide item  Medication effects: Monitoring for both therapeutic response and side effects  Follow-up schedule: 25-day return visit with earlier availability if symptoms worsen  Integrated assessment: Coordinated evaluation of depression and diabetes outcomes  INTEGRATED CARE APPROACH:  Diabetes-depression coordination: Synchronized treatment plans recognizing bidirectional relationship  Family involvement: Daughter informed of medication changes with patient permission  Communication plan: Direct phone access provided for urgent concerns between visits  Barriers addressed: Medication cost coverage verified through insurance  CLINICAL REASONING AND EVIDENCE BASE: GUIDELINE ALIGNMENT: Treatment approach follows American Psychiatric Association (APA) Practice Guidelines for MDD and VA/DoD Clinical Practice Guidelines for MDD. Combined pharmacotherapy and psychotherapy represents first-line approach for severe depression with suicidal ideation per consensus guidelines.  MEDICATION SELECTION EVIDENCE:  Escitalopram efficacy: STAR*D trial and subsequent meta-analyses demonstrate comparable efficacy to other antidepressants with superior tolerability profile  Diabetes considerations: SSRI selection informed by systematic review evidence showing potential beneficial effects on glycemic control and insulin sensitivity  Gradual titration: Approach supported by evidence showing reduced discontinuation rates with start-low, go-slow approach  Previous trial considerations: Limited details available regarding prior SSRI  experience; therapeutic trial requires adequate dose, duration (>6 weeks), and adherence  PSYCHOTHERAPY EVIDENCE:  EMDR for PTSD: Meta-analyses show large effect sizes (Hedges' g = 0.66) for reducing PTSD symptoms with sustained benefits  Combined treatment: STAR*D and other trials demonstrate superior outcomes with combined pharmacotherapy-psychotherapy versus either modality alone  Trauma focus: Evidence indicates addressing underlying trauma improves treatment outcomes in depression with comorbid PTSD  BENEFIT-RISK ANALYSIS:  Primary benefits: Symptom relief, suicide risk reduction, improved functioning, potential synergistic effect with diabetes management  Key risks: SSRI side effects (initial anxiety, GI symptoms, sexual dysfunction), potential for serotonin syndrome with other medications  NNT/NNH considerations:  Type 2 diabetes mellitus with hyperglycemia, without long-term current use of insulin (HCC) Type 2 Diabetes with Severe Hyperglycemia (E11.65) #AcuteDecompensation  COMPREHENSIVE ASSESSMENT: CURRENT STATUS: 48 year old female with type 2 diabetes diagnosed late 2023, initially well-controlled through dietary management (A1c improved from 11.0% to 7.0%), now presenting with  acute hyperglycemic decompensation. Home glucose readings 300-500 mg/dL over past week, which represents significant deterioration from previous control. Patient reports associated polyuria but denies polydipsia, blurred vision, or other symptoms of acute hyperglycemic crisis.  RISK STRATIFICATION: Patient presents with severe hyperglycemia without evidence of diabetic ketoacidosis or hyperosmolar hyperglycemic state. American Diabetes Association criteria for outpatient management are met. [place] 10-year ASCVD risk calculated at approximately 5.2% (moderate), supporting primary prevention measures. UKPDS risk engine suggests 15% risk of any diabetes-related endpoint over 10 years without  intervention.  CONTRIBUTING FACTORS:  Primary: Significant psychosocial stressors with work and personal issues  Nutritional: Self-described "terrible" dietary habits with stress eating, particularly high sugar foods (ice cream)  Psychiatric: Severe depression (PHQ-9: 24) affecting self-care behaviors  Medication: Non-adherence to prescribed tirzepatide  Physical: Insufficient physical activity during periods of depression  COMPLICATIONS ASSESSMENT:  Microvascular: No evidence of diabetic nephropathy (microalbumin/creatinine ratio normal 03/2023), no diabetic retinopathy on eye exam (12/2021), previous foot exam normal (04/2023)  Macrovascular: No known cardiovascular disease. LDL well-controlled at 98 mg/dL (16/1096)  Other: No symptoms of diabetic neuropathy reported  GLYCEMIC PATTERNS: Severe hyperglycemia across all times of day per patient report, suggesting both basal and postprandial dysregulation. A1c trend shows previous improvement (11.0% ? 7.0%) with dietary management alone, but current glucose levels suggest significant deterioration that would correspond to estimated A1c >10% if sustained.  DIFFERENTIAL DIAGNOSIS FOR DECOMPENSATION:  Primary (80%): Psychosocial stressors leading to poor dietary adherence and medication discontinuation  Secondary (15%): Natural progression of underlying diabetes requiring intensification beyond lifestyle measures  Less likely (5%): Intercurrent illness, medication interactions, or undiagnosed endocrinopathy  COMPREHENSIVE MANAGEMENT PLAN: PHARMACOTHERAPY RATIONALE: Implementing multi-drug approach based on ADA Standards of Care 2025 recommendations for rapid glycemic control with A1c significantly above target. Plan addresses both fasting and postprandial hyperglycemia through complementary mechanisms:  1. Basal glucose control:  metFORMIN 500 mg BID with meals: First-line therapy reducing hepatic glucose production and improving peripheral  insulin sensitivity. Starting at moderate dose to minimize GI side effects with potential for uptitration.  dapagliflozin (FARXIGA) 5 mg daily before breakfast: SGLT2 inhibitor providing insulin-independent glucose lowering, cardiovascular and renal protection. Evidence from [facility] 58 trial demonstrates 17% reduction in MACE and 47% reduction in kidney disease progression.  2. Postprandial glucose control:  glipiZIDE 5 mg BID before meals: Sulfonylurea for rapid reduction in glucose levels through enhanced insulin secretion. Allows immediate improvement while awaiting GLP-1 RA approval and effect.  semaglutide (OZEMPIC) 0.25 mg weekly: Prior authorization submitted. GLP-1 receptor agonist recommended for glycemic efficacy (1.5-2.0% A1c reduction), weight management benefit, and cardiovascular protection (SUSTAIN-6 trial showed 26% reduction in MACE).  3. Supportive therapy:  ondansetron 4 mg PRN: Proactive management of GI side effects from metformin and anticipated GLP-1 RA therapy to improve treatment adherence.  DECISION TREE FOR THERAPY ADJUSTMENT:  At 2 weeks: If fasting glucose >180 mg/dL despite adherence, increase metformin to 1000 mg BID  At 2 weeks: If postprandial glucose >250 mg/dL despite adherence, increase glipizide to 10 mg BID  At 4 weeks: If A1c remains >9% or glucose consistently >200 mg/dL, consider basal insulin initiation  After GLP-1 RA approval: Start semaglutide 0.25 mg weekly  4 weeks, then increase to 0.5 mg weekly  After GLP-1 RA established: Consider tapering/discontinuing glipizide to minimize hypoglycemia risk  NUTRITION AND LIFESTYLE INTERVENTION: Tailored approach recognizing current psychosocial limitations:  Dietary strategy: Focus on simple carbohydrate consistency rather than strict caloric or carbohydrate counting  Targeted substitutions: Identify 2-3 high-impact dietary modifications that patient feels  capable of implementing  Stress-related eating:  Develop practical alternative coping strategies for emotional triggers  Physical activity: Start with 5-10 minute walking intervals if possible, emphasizing mood benefits  Self-monitoring: Simplified glucose monitoring schedule focusing on fasting and one postprandial time daily  MONITORING AND FOLLOW-UP:  Laboratory assessment: A1c, comprehensive metabolic panel, lipid panel ordered today  Complication screening: Microalbumin/creatinine ratio ordered; ophthalmology follow-up due 12/2022 confirmed  Medication safety: Education provided on hypoglycemia recognition and management; SGLT2i precautions reviewed  Comprehensive follow-up: 25-day return visit with medication adjustments based on glucose patterns and A1c results  INTEGRATED CARE APPROACH:  Depression management: Coordinated with escitalopram initiation to address bidirectional relationship  Medication adherence: Simplified regimen with daughter involvement for support  Social determinants: Explored workplace stressors; FMLA documentation offered  Education: Tailored diabetes education focusing on hyperglycemia symptoms, medication mechanisms, and treatment expectations  CLINICAL REASONING AND EVIDENCE BASE: GUIDELINE ALIGNMENT: Management approach follows 2025 ADA Standards of Care recommendation for patients with A1c significantly above target to start with combination therapy. SGLT2 inhibitor choice supported by 2023 ESC/EASD guidelines recommending early use in T2DM for cardiorenal protection independent of glycemic control.  MEDICATION SELECTION RATIONALE:  Metformin: Position as first-line therapy supported by UKPDS long-term follow-up showing 33% reduction in microvascular complications and 27% reduction in all-cause mortality.  SGLT2 inhibitor: Selected dapagliflozin based on DECLARE-TIMI 58 evidence for cardiorenal protection and favorable urinary symptom profile given patient's glycosuria.  Sulfonylurea: Temporary bridge therapy  supported by ADOPT study data on rapid glucose-lowering effect while awaiting GLP-1 RA initiation.  GLP-1 RA: Semaglutide selected based on SUSTAIN-6 and PIONEER-6 evidence showing superior A1c reduction, weight benefits, and cardiovascular protection compared to other agents.  BENEFIT-RISK ASSESSMENT:  Primary benefits: Rapid improvement in hyperglycemia, weight management support, cardiorenal protection  Key risks: Hypoglycemia with sulfonylurea (mitigated by meal timing), GI effects (proactively managed), urinary tract infections with SGLT2i (monitoring planned)  NNT/NNH: SGLT2i NNT=22 for prevention of heart failure hospitalization, sulfonylurea NNH=17-21 for hypoglycemia events  INDIVIDUALIZATION FACTORS:  Psychosocial: Severe depression influencing regimen design (simplified, coordinated with psychiatric care)  Economic: Insurance coverage verified for all medications except semaglutide (pending authorization)  Comorbidities: Approach considers mental health needs and leukopenia evaluation  Preferences: Patient's initial medication hesitancy addressed through education and shared decision-making  PATIENT EDUCATION DELIVERED: HYPERGLYCEMIA COMPLICATIONS: Reviewed short-term risks (dehydration, fatigue, increased infection risk) and long-term complications (retinopathy, nephropathy, neuropathy, cardiovascular disease). Emphasized that current blood glucose levels significantly increase risk for complications, but rapid improvement can substantially reduce this risk.  MEDICATION MECHANISMS:  Metformin: Reduces sugar production by liver and improves insulin effectiveness  Glipizide: Stimulates pancreas to release more insulin quickly  Dapagliflozin: Removes excess sugar through urine and protects heart and kidneys  Semaglutide: Slows stomach emptying, reduces appetite, and enhances insulin release  HYPOGLYCEMIA PREVENTION AND MANAGEMENT: Reviewed symptoms (shakiness, confusion, sweating,  hunger) and treatment (15-15 rule: 15g fast-acting carbohydrate, check in 15 minutes, repeat if needed). Provided glucose tablets and emphasized importance of regular meals when taking glipizide.  GLUCOSE MONITORING TECHNIQUE: Verified proper technique and timing. Established structured monitoring schedule with fasting and 2-hour post-dinner checks daily, with additional checks if symptoms occur.  DIET AND LIFESTYLE: Focused on practical strategies given current challenges. Identified specific high-impact substitutions (replacing sugary beverages with water, selecting lower-carbohydrate snack alternatives) and meal timing to optimize medication effectiveness.  DIABETES-DEPRESSION CONNECTION: Discussed bidirectional relationship between depression and diabetes control. Explained how improving one condition often helps the other, reinforcing importance of integrated treatment approach.   Nausea  Leukopenia,  unspecified type  Vitamin D deficiency  Postmenopausal estrogen deficiency  Urinary frequency   Assessment and Plan Assessment & Plan Type 2 diabetes mellitus with hyperglycemia   Severe hyperglycemia with blood glucose levels between 300 to 500 mg/dL is influenced by stress, poor diet, and lack of exercise. She understands the risk of complications like vision loss, neuropathy, and cardiovascular issues. Although initially hesitant, she recognizes the need for treatment to prevent further complications. Ozempic (semaglutide) is recommended for its efficacy in reducing blood glucose and convenient weekly dosing. Discussed risks include nausea, constipation, and unproven cancer risks, while benefits include reduced risk of myocardial infarction and stroke. Alternative treatments like insulin and other oral medications were considered, but Ozempic is preferred. Start Ozempic at a low dose to minimize side effects. Prescribe Zofran for nausea management. Emphasize lifestyle modifications,  including dietary changes and increased physical activity. Consider Marcelline Deist or Jardiance to protect renal function, acknowledging the risk of urinary tract infections.  Daughter notified me that there will be prior authorization on Ozempic so its delayed so called in metformin glipizide for in meantime.  Severe depression   Her depression score increased from 13 to 24, indicating severe depression with suicidal ideation, exacerbated by stress and possibly linked to past trauma and post-hysterectomy. Previous antidepressants were ineffective. Lexapro (escitalopram) is recommended for stress reduction and mood improvement. Counseling and therapy, particularly EMDR for PTSD, were discussed. She prefers talk therapy due to concerns about stigma and medication. FMLA paperwork is offered for time off work if needed. Information on the [facility] is provided for emergencies. Start Lexapro with a half tablet for the first two weeks, then increase to a full tablet. Encourage follow-up with [facility] for counseling and potential EMDR therapy. Discuss FMLA paperwork option. Provide information on the [facility] for emergencies.  Vitamin D deficiency   Vitamin D deficiency may contribute to overall health issues. Prescribe prescription-strength vitamin D.  Follow-up   Regular follow-up is necessary to monitor treatment response and adjust the management plan. A follow-up appointment is scheduled in 25 days to assess the response to Ozempic and Lexapro. Plan lab tests including A1c, vitamin D, B12, and urine analysis to monitor diabetes control and nutritional status.  Recording duration: 40 minutes        Orders Placed During this Encounter:   Orders Placed This Encounter  Procedures   CBC with Differential/Platelet   Comprehensive metabolic panel with GFR    [place]    Has the patient fasted?:   No   Lipid panel    [place]    Has the  patient fasted?:   No   Hemoglobin A1c    [place]   Vitamin D (25 hydroxy)   B12 and Folate Panel   Microalbumin / creatinine urine ratio    [place]   Urinalysis w microscopic + reflex cultur   Meds ordered this encounter  Medications   Semaglutide,0.25 or 0.5MG /DOS, (OZEMPIC, 0.25 OR 0.5 MG/DOSE,) 2 MG/3ML SOPN    Sig: Inject 0.25 mg into the skin once a week.    Dispense:  3 mL    Refill:  2   ondansetron (ZOFRAN-ODT) 4 MG disintegrating tablet    Sig: Dissolve 1 tablet under the tongue every 8 (eight) hours as needed for nausea or vomiting.    Dispense:  20 tablet    Refill:  2   ondansetron (ZOFRAN-ODT) 4 MG disintegrating tablet    Sig: Dissolve 1 tablet under the  tongue every 6 (six) hours as needed for nausea.    Dispense:  20 tablet    Refill:  0   escitalopram (LEXAPRO) 10 MG tablet    Sig: Take 1 tablet (10 mg total) by mouth daily. Take half tablet for first 2 weeks to start.    Dispense:  30 tablet    Refill:  1   dapagliflozin propanediol (FARXIGA) 5 MG TABS tablet    Sig: Take 1 tablet (5 mg total) by mouth daily before breakfast.    Dispense:  90 tablet    Refill:  3   metFORMIN (GLUCOPHAGE) 500 MG tablet    Sig: Take 1 tablet (500 mg total) by mouth 2 (two) times daily with a meal.    Dispense:  180 tablet    Refill:  3   glipiZIDE (GLUCOTROL) 5 MG tablet    Sig: Take 1 tablet (5 mg total) by mouth 2 (two) times daily before a meal.    Dispense:  60 tablet    Refill:  3   Comprehensive Integrated Assessment and Plan 05/10/2023 #ComplexCare  CLINICAL SUMMARY AND RISK STRATIFICATION: PATIENT PROFILE: 48 year old female with multiple interacting conditions requiring integrated management approach. Primary issues involve severe decompensation of both type 2 diabetes (glucose 300-500 mg/dL) and major depression (PHQ-9 score 24 with suicidal ideation), against background of PTSD, vitamin D deficiency, and newly identified leukopenia and urinary  frequency.  COMPOSITE RISK ASSESSMENT:  Acute medical risk: HIGH - Severe hyperglycemia with risk for metabolic complications  Psychiatric safety risk: MODERATE - Suicidal ideation present but without active plan/intent  Functional impairment: HIGH - "Extremely difficult" to perform daily activities  Complication development risk: MODERATE - No evidence of microvascular complications despite prior poor control  Treatment complexity risk: MODERATE - Multiple new medications with interaction potential  ILLNESS TRAJECTORY ASSESSMENT: Acute bidirectional decompensation of diabetes and depression, likely precipitated by psychosocial stressors and treatment non-adherence (discontinued tirzepatide). Trajectory suggests rapid deterioration over 2-4 weeks with potential for further decline without intervention. Historical pattern shows capability for significant improvement with appropriate interventions (previous A1c reduction from 11.0% to 7.0% with dietary management alone).  SYSTEMS ANALYSIS FACTORS:  Biological: Dysglycemia, possible vitamin deficiencies (vitamin D, B12), potential hormonal factors (postmenopausal)  Psychological: Major depression, PTSD, attention difficulties  Social: Work stressors, family support limited to daughter  Behavioral: Medication non-adherence, dietary challenges, physical inactivity  Healthcare system: Mental health access barriers, medication insurance coverage challenges  INTEGRATED MANAGEMENT PLAN: PRIORITY SEQUENCE RATIONALE: Intervention priorities established based on acuity, risk, and synergistic treatment effects: 1) Glycemic stabilization - Primary metabolic safety concern requiring immediate intervention 2) Depression management - Critical for both safety and enabling diabetes self-care 3) Diagnostic clarification - Comprehensive testing for leukopenia, vitamin deficiencies 4) Supportive care - Address contributing factors while primary conditions  stabilize  MULTIMODAL THERAPEUTIC APPROACH:  Pharmacological intervention: Synchronized medication regimen addressing multiple conditions   - Diabetes: Metformin 500mg  BID + glipizide 5mg  BID + dapagliflozin 5mg  daily + pending semaglutide   - Depression: Escitalopram 5mg  daily  2 weeks, then 10mg  daily   - Supportive: Ondansetron 4mg  PRN, vitamin D 5,000 IU daily  Psychological support: Evidence-based trauma-focused therapy referral (EMDR)  Behavioral strategies: Simplified diabetes self-management, basic depression coping techniques  Social intervention: FMLA paperwork offered, family education/involvement  INTEGRATED CARE COORDINATION:  Information sharing: Coordinated communication between primary care, endocrinology, mental health  Medication synchronization: Consolidated medication regimen with attention to timing and interactions  Visit scheduling: Comprehensive follow-up at 25 days encompassing  all active conditions  Referral management: Expedited psychology referral with trauma-focus specification  COMPREHENSIVE MONITORING FRAMEWORK:  Objective measures:   - Ordered today: CBC, CMP, HbA1c, vitamin D, B12/folate, microalbumin/creatinine, UA   - Self-monitoring: Daily glucose readings, weekly PHQ-9 self-assessments  Clinical reassessment schedule:   - Diabetes: Glucose pattern analysis, medication tolerance, symptom changes   - Depression: Symptom trajectory, suicide risk reassessment, medication effects   - Leukopenia: CBC result interpretation, symptom monitoring  Emergency surveillance: Clear parameters for urgent contact established for hyperglycemic crisis or suicidal ideation progression  CLINICAL DECISION THRESHOLDS: GLUCOSE MANAGEMENT THRESHOLDS:  Emergency intervention: Glucose >500 mg/dL with symptoms, ketosis, or altered mental status  Insulin initiation threshold: Fasting glucose >250 mg/dL after 2 weeks on current regimen  Metformin dose adjustment: Increase to  1000mg  BID if fasting glucose >180 mg/dL after 2 weeks  Target achievement: Fasting glucose <130 mg/dL, postprandial <161 mg/dL  PSYCHIATRIC MANAGEMENT THRESHOLDS:  Emergency psychiatric evaluation: Development of active suicidal intent/plan, severe agitation  Medication adjustment threshold: <30% reduction in PHQ-9 at 4 weeks  Psychiatric referral threshold: Inadequate response at 8 weeks, emergence of hypomanic symptoms  Target achievement: PHQ-9 <10 with resolution of suicidal ideation  DIAGNOSTIC EVALUATION THRESHOLDS:  Hematology referral: WBC <3.0 or neutrophil count <1.0  Urological evaluation: Persistent urinary symptoms after glycemic normalization  Vitamin D management: Supplementation increase if 25-OH vitamin D <20 ng/mL  B12 intervention: Parenteral B12 if serum level <200 pg/mL or neurological symptoms  EVIDENCE-BASED CLINICAL REASONING: INTEGRATED CARE EVIDENCE: Structured integrated care approaches for diabetes-depression comorbidity show substantial improvements in both conditions compared to usual care. Meta-analyses demonstrate 0.33% additional HbA1c reduction and 0.37 standardized mean difference in depression scores with collaborative care models ([employer] Syst Rev, 2021).  DIABETES-DEPRESSION BIDIRECTIONAL RELATIONSHIP: Systematic reviews establish bidirectional relationship between glycemic control and depression severity. Improvements in depression associate with 0.5-1.0% HbA1c reductions; conversely, improved glycemic control correlates with reduced depression symptoms (Diabetes Care, 2021).  MULTIDRUG DIABETES APPROACH: Evidence from VERIFY trial supports early combination therapy in type 2 diabetes for more durable glycemic control compared to sequential monotherapy. ADA guidelines endorse multi-drug approach when A1c >1.5% above target, particularly with symptoms of hyperglycemia.  DEPRESSION TREATMENT SELECTION: Comparative effectiveness research supports  escitalopram as first-line agent with favorable efficacy-to-side-effect profile. EMDR therapy for PTSD-comorbid depression shows 0.66 effect size in meta-analyses, superior to non-trauma-focused approaches.  VITAMIN D CONSIDERATIONS: Meta-analyses suggest vitamin D supplementation may provide modest improvements in depression symptoms (SMD=0.28) and potential benefits for glycemic control, particularly in deficient individuals.  PATIENT-CENTERED CONSIDERATIONS: ALIGNED TREATMENT GOALS: Treatment plan explicitly aligns with patient-identified priorities: improving blood glucose, addressing worsening depression symptoms, maintaining employment function, and minimizing medication burden when possible.  PREFERENCE INCORPORATION: Management approach respects patient preferences by:  Medication approach: Gradual SSRI titration addressing previous concerns about side effects  Mental health care: Exploring non-[facility] provider options as per patient preference  Treatment burden: Consolidating medication schedules to simplify daily regimen  Education: Providing detailed information addressing expressed desire to understand medication mechanisms  BARRIERS ADDRESSED:  Financial: Insurance coverage verified for medications; contingency plan developed for semaglutide  Occupational: FMLA documentation offered to facilitate workplace accommodations  Cognitive: Simplified self-management instructions provided given concentration difficulties  Psychological: Stigma concerns acknowledged and addressed through education  CULTURAL COMPETENCE: Treatment approach considers potential cultural factors in mental health conceptualization and treatment preferences, though specific cultural identity factors not detailed in available documentation.  VISIT DOCUMENTATION SUMMARY: ENCOUNTER DETAILS: 40-minute comprehensive visit addressing multiple complex medical issues including diabetes decompensation,  severe depression with  suicidal ideation, and newly identified leukopenia.  ORDERS GENERATED:  Medications (7): metformin, glipizide, dapagliflozin, escitalopram, ondansetron, pending semaglutide  Laboratory tests (8): CBC with differential, CMP with GFR, lipid panel, HbA1c, vitamin D, B12/folate, microalbumin/creatinine ratio, urinalysis with microscopic  Referrals: Reinforced psychology referral; potential hematology pending CBC results  CARE COORDINATION: Daughter notified about medication changes with patient permission. Prior authorization process initiated for semaglutide (Ozempic) with temporary alternative regimen established.  FOLLOW-UP PLAN: Comprehensive reassessment in 25 days with laboratory review, medication adjustment, and depression status evaluation. Emergency contact parameters clearly established for both diabetes and mental health concerns.   I have personally spent 58 minutes involved in face-to-face and non-face-to-face activities for this patient on the day of the visit. Professional time spent includes the following activities: Preparing to see the patient (review of tests), Obtaining and/or reviewing separately obtained history (admission/discharge record), Performing a medically appropriate examination and/or evaluation , Ordering medications/tests/procedures, referring and communicating with other health care professionals, Documenting clinical information in the EMR, Independently interpreting results (not separately reported), Communicating results to the patient/family/caregiver, Counseling and educating the patient/family/caregiver and Care coordination (not separately reported).        **This document was synthesized by artificial intelligence ([employer]bridge) using HIPAA-compliant recording of the clinical interaction;   We discussed the use of AI scribe software for clinical note transcription with the patient, who gave verbal consent to proceed.    Additional Info: This encounter employed  state-of-the-art, real-time, collaborative documentation. The patient actively reviewed and assisted in updating their electronic medical record on a shared screen, ensuring transparency and facilitating joint problem-solving for the problem list, overview, and plan. This approach promotes accurate, informed care. The treatment plan was discussed and reviewed in detail, including medication safety, potential side effects, and all patient questions. We confirmed understanding and comfort with the plan. Follow-up instructions were established, including contacting the office for any concerns, returning if symptoms worsen, persist, or new symptoms develop, and precautions for potential emergency department visits.

## 2023-05-10 NOTE — Patient Instructions (Addendum)
 Your Medication & Follow-up Instructions Date: May 10, 2023 Why We're Starting These Medications Your blood sugar readings of 300-500 mg/dL are dangerously high and need immediate treatment to prevent complications. At the same time, your depression has become severe (PHQ-9 score: 24), which can make managing your diabetes even harder. We're treating both conditions together because they affect each other. Your New Medication Plan For Diabetes: Metformin (Glucophage) 500 mg  START WITH: 1 tablet with breakfast for first 3 days THEN INCREASE TO: 1 tablet with breakfast and 1 tablet with dinner WHY: Helps your liver release less sugar and helps your body use insulin better POSSIBLE SIDE EFFECTS: Upset stomach, diarrhea (usually improves after 1-2 weeks) Glipizide (Glucotrol) 5 mg  START WITH:  tablet before breakfast for first 3 days THEN INCREASE TO:  tablet before breakfast and  tablet before dinner AFTER 1 WEEK IF TOLERATED: 1 tablet before breakfast and 1 tablet before dinner WHY: Helps your pancreas release more insulin to lower blood sugar quickly POSSIBLE SIDE EFFECTS: Low blood sugar (see warning signs below) Dapagliflozin (Farxiga) 5 mg - PENDING INSURANCE APPROVAL  WHEN APPROVED: 1 tablet every morning before breakfast WHY: Removes sugar through urine and protects your kidneys and heart POSSIBLE SIDE EFFECTS: Increased urination, urinary tract infections Semaglutide (Ozempic) injection - PENDING INSURANCE APPROVAL  WHEN APPROVED: Will start at lowest dose with instructions at that time WHY: Lowers blood sugar, reduces appetite, and protects your heart POSSIBLE SIDE EFFECTS: Nausea, reduced appetite For Depression: Escitalopram (Lexapro) 10 mg  START WITH:  tablet (5 mg) every morning for first 2 weeks THEN INCREASE TO: 1 tablet (10 mg) every morning WHY: Helps balance brain chemicals to improve mood and reduce anxiety POSSIBLE SIDE EFFECTS: Headache, nausea, drowsiness  (usually temporary) NOTE: Takes 2-4 weeks to start working; full effect may take 6-8 weeks For Side Effect Management: Ondansetron (Zofran ODT) 4 mg dissolving tablets  TAKE AS NEEDED: Let 1 tablet dissolve under tongue every 6-8 hours if nausea occurs WHY: Prevents and treats nausea from other medications DO NOT TAKE MORE THAN: 3 tablets in 24 hours Continue Taking: Vitamin D3 5,000 IU - Daily Adderall XR 5 mg - Daily in morning All your other regular medications as previously prescribed Blood Sugar Monitoring Instructions Check your blood sugar:  First thing in the morning (fasting) 2 hours after dinner Any time you feel symptoms of low or high blood sugar Target blood sugar ranges:  Fasting (morning): Below 130 mg/dL 2 hours after meals: Below 180 mg/dL Keep a log of all readings to bring to your follow-up appointment Important Warning Signs Call the office immediately or go to the emergency room if: Blood sugar above 400 mg/dL with:  Excessive thirst or urination Severe nausea/vomiting Confusion Difficulty breathing Low blood sugar (below 70 mg/dL) with:  Shakiness, sweating, fast heartbeat Confusion, irritability Weakness, dizziness That doesn't improve after eating 15g of carbohydrates (4 oz juice or glucose tablets) Depression symptoms worsen with:  Worsening thoughts of suicide or self-harm Development of specific plan for self-harm Severe agitation or panic For mental health emergencies, contact [facility] at [phone] or go to the nearest emergency room. Follow-up Plan Please schedule a follow-up visit in 7-10 days (sooner than originally discussed) to:  Review your blood sugar readings Check for medication side effects Assess your depression symptoms Make any needed medication adjustments Lab work: Please complete the lab tests ordered today within the next 3-5 days Insurance updates: We will contact you  about Ozempic and  Proofreader Diet: Focus on consistent meal timing and reducing sugar intake Activity: Try short 5-10 minute walks if possible Stress: Use deep breathing when feeling overwhelmed Sleep: Maintain regular sleep schedule Support: Connect with your daughter or other supportive people Notes from Today's Visit: We understand managing both diabetes and depression is challenging. The good news is that treating one condition often helps the other improve as well. By starting these medications and making small lifestyle changes, many patients see significant improvements in both their blood sugar and mood within a few weeks. Please remember that you're not alone in this journey. Our office is here to support you. Call us any time with questions or concerns about your treatment plan. Next appointment: Please call the office to schedule your follow-up for 7-10 days from today.

## 2023-05-10 NOTE — Assessment & Plan Note (Signed)
 Type 2 Diabetes with Severe Hyperglycemia (E11.65) #AcuteDecompensation  COMPREHENSIVE ASSESSMENT: CURRENT STATUS: 48 year old female with type 2 diabetes diagnosed late 2023, initially well-controlled through dietary management (A1c improved from 11.0% to 7.0%), now presenting with acute hyperglycemic decompensation. Home glucose readings 300-500 mg/dL over past week, which represents significant deterioration from previous control. Patient reports associated polyuria but denies polydipsia, blurred vision, or other symptoms of acute hyperglycemic crisis.  RISK STRATIFICATION: Patient presents with severe hyperglycemia without evidence of diabetic ketoacidosis or hyperosmolar hyperglycemic state. American Diabetes Association criteria for outpatient management are met. [place] 10-year ASCVD risk calculated at approximately 5.2% (moderate), supporting primary prevention measures. UKPDS risk engine suggests 15% risk of any diabetes-related endpoint over 10 years without intervention.  CONTRIBUTING FACTORS:  Primary: Significant psychosocial stressors with work and personal issues  Nutritional: Self-described "terrible" dietary habits with stress eating, particularly high sugar foods (ice cream)  Psychiatric: Severe depression (PHQ-9: 24) affecting self-care behaviors  Medication: Non-adherence to prescribed tirzepatide  Physical: Insufficient physical activity during periods of depression  COMPLICATIONS ASSESSMENT:  Microvascular: No evidence of diabetic nephropathy (microalbumin/creatinine ratio normal 03/2023), no diabetic retinopathy on eye exam (12/2021), previous foot exam normal (04/2023)  Macrovascular: No known cardiovascular disease. LDL well-controlled at 98 mg/dL (16/1096)  Other: No symptoms of diabetic neuropathy reported  GLYCEMIC PATTERNS: Severe hyperglycemia across all times of day per patient report, suggesting both basal and postprandial dysregulation. A1c trend shows previous  improvement (11.0% ? 7.0%) with dietary management alone, but current glucose levels suggest significant deterioration that would correspond to estimated A1c >10% if sustained.  DIFFERENTIAL DIAGNOSIS FOR DECOMPENSATION:  Primary (80%): Psychosocial stressors leading to poor dietary adherence and medication discontinuation  Secondary (15%): Natural progression of underlying diabetes requiring intensification beyond lifestyle measures  Less likely (5%): Intercurrent illness, medication interactions, or undiagnosed endocrinopathy  COMPREHENSIVE MANAGEMENT PLAN: PHARMACOTHERAPY RATIONALE: Implementing multi-drug approach based on ADA Standards of Care 2025 recommendations for rapid glycemic control with A1c significantly above target. Plan addresses both fasting and postprandial hyperglycemia through complementary mechanisms:  1. Basal glucose control:  metFORMIN 500 mg BID with meals: First-line therapy reducing hepatic glucose production and improving peripheral insulin sensitivity. Starting at moderate dose to minimize GI side effects with potential for uptitration.  dapagliflozin (FARXIGA) 5 mg daily before breakfast: SGLT2 inhibitor providing insulin-independent glucose lowering, cardiovascular and renal protection. Evidence from [facility] trial demonstrates 17% reduction in MACE and 47% reduction in kidney disease progression.  2. Postprandial glucose control:  glipiZIDE 5 mg BID before meals: Sulfonylurea for rapid reduction in glucose levels through enhanced insulin secretion. Allows immediate improvement while awaiting GLP-1 RA approval and effect.  semaglutide (OZEMPIC) 0.25 mg weekly: Prior authorization submitted. GLP-1 receptor agonist recommended for glycemic efficacy (1.5-2.0% A1c reduction), weight management benefit, and cardiovascular protection (SUSTAIN-6 trial showed 26% reduction in MACE).  3. Supportive therapy:  ondansetron 4 mg PRN: Proactive management of GI side  effects from metformin and anticipated GLP-1 RA therapy to improve treatment adherence.  DECISION TREE FOR THERAPY ADJUSTMENT:  At 2 weeks: If fasting glucose >180 mg/dL despite adherence, increase metformin to 1000 mg BID  At 2 weeks: If postprandial glucose >250 mg/dL despite adherence, increase glipizide to 10 mg BID  At 4 weeks: If A1c remains >9% or glucose consistently >200 mg/dL, consider basal insulin initiation  After GLP-1 RA approval: Start semaglutide 0.25 mg weekly  4 weeks, then increase to 0.5 mg weekly  After GLP-1 RA established: Consider tapering/discontinuing glipizide to minimize hypoglycemia risk  NUTRITION AND LIFESTYLE INTERVENTION: Tailored approach recognizing current psychosocial limitations:  Dietary strategy: Focus on simple carbohydrate consistency rather than strict caloric or carbohydrate counting  Targeted substitutions: Identify 2-3 high-impact dietary modifications that patient feels capable of implementing  Stress-related eating: Develop practical alternative coping strategies for emotional triggers  Physical activity: Start with 5-10 minute walking intervals if possible, emphasizing mood benefits  Self-monitoring: Simplified glucose monitoring schedule focusing on fasting and one postprandial time daily  MONITORING AND FOLLOW-UP:  Laboratory assessment: A1c, comprehensive metabolic panel, lipid panel ordered today  Complication screening: Microalbumin/creatinine ratio ordered; ophthalmology follow-up due 12/2022 confirmed  Medication safety: Education provided on hypoglycemia recognition and management; SGLT2i precautions reviewed  Comprehensive follow-up: 25-day return visit with medication adjustments based on glucose patterns and A1c results  INTEGRATED CARE APPROACH:  Depression management: Coordinated with escitalopram initiation to address bidirectional relationship  Medication adherence: Simplified regimen with daughter involvement for support   Social determinants: Explored workplace stressors; FMLA documentation offered  Education: Tailored diabetes education focusing on hyperglycemia symptoms, medication mechanisms, and treatment expectations  CLINICAL REASONING AND EVIDENCE BASE: GUIDELINE ALIGNMENT: Management approach follows 2025 ADA Standards of Care recommendation for patients with A1c significantly above target to start with combination therapy. SGLT2 inhibitor choice supported by 2023 ESC/EASD guidelines recommending early use in T2DM for cardiorenal protection independent of glycemic control.  MEDICATION SELECTION RATIONALE:  Metformin: Position as first-line therapy supported by UKPDS long-term follow-up showing 33% reduction in microvascular complications and 27% reduction in all-cause mortality.  SGLT2 inhibitor: Selected dapagliflozin based on DECLARE-TIMI 58 evidence for cardiorenal protection and favorable urinary symptom profile given patient's glycosuria.  Sulfonylurea: Temporary bridge therapy supported by ADOPT study data on rapid glucose-lowering effect while awaiting GLP-1 RA initiation.  GLP-1 RA: Semaglutide selected based on SUSTAIN-6 and PIONEER-6 evidence showing superior A1c reduction, weight benefits, and cardiovascular protection compared to other agents.  BENEFIT-RISK ASSESSMENT:  Primary benefits: Rapid improvement in hyperglycemia, weight management support, cardiorenal protection  Key risks: Hypoglycemia with sulfonylurea (mitigated by meal timing), GI effects (proactively managed), urinary tract infections with SGLT2i (monitoring planned)  NNT/NNH: SGLT2i NNT=22 for prevention of heart failure hospitalization, sulfonylurea NNH=17-21 for hypoglycemia events  INDIVIDUALIZATION FACTORS:  Psychosocial: Severe depression influencing regimen design (simplified, coordinated with psychiatric care)  Economic: Insurance coverage verified for all medications except semaglutide (pending authorization)   Comorbidities: Approach considers mental health needs and leukopenia evaluation  Preferences: Patient's initial medication hesitancy addressed through education and shared decision-making  PATIENT EDUCATION DELIVERED: HYPERGLYCEMIA COMPLICATIONS: Reviewed short-term risks (dehydration, fatigue, increased infection risk) and long-term complications (retinopathy, nephropathy, neuropathy, cardiovascular disease). Emphasized that current blood glucose levels significantly increase risk for complications, but rapid improvement can substantially reduce this risk.  MEDICATION MECHANISMS:  Metformin: Reduces sugar production by liver and improves insulin effectiveness  Glipizide: Stimulates pancreas to release more insulin quickly  Dapagliflozin: Removes excess sugar through urine and protects heart and kidneys  Semaglutide: Slows stomach emptying, reduces appetite, and enhances insulin release  HYPOGLYCEMIA PREVENTION AND MANAGEMENT: Reviewed symptoms (shakiness, confusion, sweating, hunger) and treatment (15-15 rule: 15g fast-acting carbohydrate, check in 15 minutes, repeat if needed). Provided glucose tablets and emphasized importance of regular meals when taking glipizide.  GLUCOSE MONITORING TECHNIQUE: Verified proper technique and timing. Established structured monitoring schedule with fasting and 2-hour post-dinner checks daily, with additional checks if symptoms occur.  DIET AND LIFESTYLE: Focused on practical strategies given current challenges. Identified specific high-impact substitutions (replacing sugary beverages with water, selecting lower-carbohydrate snack alternatives) and meal timing  to optimize medication effectiveness.  DIABETES-DEPRESSION CONNECTION: Discussed bidirectional relationship between depression and diabetes control. Explained how improving one condition often helps the other, reinforcing importance of integrated treatment approach.

## 2023-05-10 NOTE — Assessment & Plan Note (Signed)
 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 This represents critical clinical deterioration requiring immediate intervention. Diagnostic criteria for major depressive disorder (vs. previous mixed anxiety-depression diagnosis) now clearly met with 9/9 symptoms endorsed: depressed mood, anhedonia, sleep disturbance, fatigue/energy loss, appetite changes, worthlessness/guilt, concentration difficulties, psychomotor changes, and suicidal ideation.  SUICIDE RISK ASSESSMENT: Patient reports suicidal thoughts "in the past two weeks" (score 2/3 on PHQ-9 item 9). Comprehensive suicide risk assessment performed using [place]-Suicide Severity Rating Scale (C-SSRS) framework:  Ideation: Passive suicidal thoughts without specific plan or intent (severity level 3/5)  Behavior: No history of suicide attempts  Acute risk factors: Severe depression, diabetes decompensation, work/personal stressors  Chronic risk factors: PTSD, chronic pain, history of trauma  Protective factors: Insight into condition, willing to engage in treatment, daughter support, expressed future desires despite symptoms  Overall risk assessment: Moderate risk without immediate safety concerns that would necessitate hospitalization  SYMPTOM ANALYSIS:  Duration: Chronic depression with acute exacerbation over past 2 weeks  Pattern: Progressive worsening in context of work/personal stressors and diabetes decompensation  Functional impact: Patient describes daily activities as "extremely difficult," significantly impaired occupational functioning  Associated symptoms: Tearfulness noted during examination, psychomotor retardation, impaired concentration  CONTRIBUTING FACTORS:  Biological: History of  postmenopausal estrogen deficiency, vitamin D deficiency, uncontrolled diabetes  Psychological: Untreated PTSD symptoms, persistent post-surgical pain, negative thought patterns  Social: Work stressors, unclear family/social support system beyond daughter  Medication: Current amphetamine-dextroamphetamine may contribute to mood lability  TREATMENT HISTORY: Previous trials of Lexapro and Wellbutrin reported as ineffective, though dosing, duration, and adherence details unclear. Previous referral to psychology not completed. Preference for non-pharmacological interventions noted previously, but now demonstrates increased receptiveness to medication given symptom severity.  DIFFERENTIAL DIAGNOSIS:  Primary (70%): Major depressive disorder, severe episode with suicidal features  Secondary (20%): PTSD with depression as primary manifestation  Secondary (5%): Adjustment disorder with depressed mood related to medical illness  Consider (5%): Bipolar II disorder (would require further assessment for hypomanic episodes)  COMPREHENSIVE MANAGEMENT PLAN: PHARMACOTHERAPY RATIONALE:  Selected agent: Escitalopram (Lexapro) 10 mg daily with gradual titration (5 mg daily for first 2 weeks)  Mechanism: Selective serotonin reuptake inhibitor (SSRI) with favorable efficacy-to-side-effect profile  Evidence base: Meta-analyses indicate 50-60% response rate for escitalopram in MDD with number needed to treat (NNT) of 6-8 compared to placebo  Rationale for retrial: Previous trial details unclear; structured approach with gradual titration may improve outcomes  SSRI selection factors: Favorable anxiolytic properties, limited drug interactions with diabetes medications, established efficacy in comorbid depression-diabetes  PSYCHOTHERAPY APPROACH:  Primary recommendation: Trauma-focused therapy with emphasis on EMDR for addressing underlying PTSD  Interim coping strategies: Provided basic cognitive-behavioral techniques  for managing negative thoughts and behavioral activation approaches  Referral status: Reinforced previous psychology referral with specific request for trauma-focused therapy  Alternative options: Exploring non-[facility] providers as per patient preference  SAFETY PLANNING:  Crisis resources: Provided [facility] information  Emergency contacts: Identified daughter as support person; confirmed contact information  Suicide safety plan: Developed written plan identifying warning signs, coping strategies, social supports, crisis numbers  Agreement: Patient verbally contracted for safety with commitment to utilize resources if thoughts worsen  DECISION TREE FOR TREATMENT ADJUSTMENTS:  Week 2: Assess medication tolerance; increase to target 10mg  if tolerated  Week 4 (25-day follow-up): Complete reassessment with PHQ-9; assess suicide risk  If minimal improvement at 4 weeks: Consider dose increase to 15-20mg  or augmentation strategies  If worsening/emergency: Immediate psychiatric assessment/hospitalization  If partially improved at 8-12 weeks: Consider adjunctive therapy (bupropion, psychotherapy intensification)  FUNCTIONAL REHABILITATION:  Occupational: Offered FMLA paperwork completion for potential medical leave  Daily structure: Developed basic activity schedule focusing on one accomplishable task daily  Sleep hygiene: Provided specific strategies for improving sleep quality  Physical activity: Recommended 5-10 minute walks for mood benefit integrated with diabetes management  MONITORING PLAN:  Symptom tracking: Weekly PHQ-9 self-assessments with attention to suicide item  Medication effects: Monitoring for both therapeutic response and side effects  Follow-up schedule: 25-day return visit with earlier availability if symptoms worsen  Integrated assessment: Coordinated evaluation of depression and diabetes outcomes  INTEGRATED CARE APPROACH:   Diabetes-depression coordination: Synchronized treatment plans recognizing bidirectional relationship  Family involvement: Daughter informed of medication changes with patient permission  Communication plan: Direct phone access provided for urgent concerns between visits  Barriers addressed: Medication cost coverage verified through insurance  CLINICAL REASONING AND EVIDENCE BASE: GUIDELINE ALIGNMENT: Treatment approach follows American Psychiatric Association (APA) Practice Guidelines for MDD and VA/DoD Clinical Practice Guidelines for MDD. Combined pharmacotherapy and psychotherapy represents first-line approach for severe depression with suicidal ideation per consensus guidelines.  MEDICATION SELECTION EVIDENCE:  Escitalopram efficacy: STAR*D trial and subsequent meta-analyses demonstrate comparable efficacy to other antidepressants with superior tolerability profile  Diabetes considerations: SSRI selection informed by systematic review evidence showing potential beneficial effects on glycemic control and insulin sensitivity  Gradual titration: Approach supported by evidence showing reduced discontinuation rates with start-low, go-slow approach  Previous trial considerations: Limited details available regarding prior SSRI experience; therapeutic trial requires adequate dose, duration (>6 weeks), and adherence  PSYCHOTHERAPY EVIDENCE:  EMDR for PTSD: Meta-analyses show large effect sizes (Hedges' g = 0.66) for reducing PTSD symptoms with sustained benefits  Combined treatment: STAR*D and other trials demonstrate superior outcomes with combined pharmacotherapy-psychotherapy versus either modality alone  Trauma focus: Evidence indicates addressing underlying trauma improves treatment outcomes in depression with comorbid PTSD  BENEFIT-RISK ANALYSIS:  Primary benefits: Symptom relief, suicide risk reduction, improved functioning, potential synergistic effect with diabetes management  Key risks:  SSRI side effects (initial anxiety, GI symptoms, sexual dysfunction), potential for serotonin syndrome with other medications  NNT/NNH considerations:

## 2023-05-11 ENCOUNTER — Encounter: Payer: Self-pay | Admitting: Family

## 2023-05-11 ENCOUNTER — Other Ambulatory Visit: Payer: Self-pay

## 2023-05-11 ENCOUNTER — Other Ambulatory Visit (HOSPITAL_COMMUNITY): Payer: Self-pay

## 2023-05-11 ENCOUNTER — Encounter: Payer: Self-pay | Admitting: Internal Medicine

## 2023-05-11 ENCOUNTER — Telehealth: Payer: Self-pay | Admitting: Internal Medicine

## 2023-05-11 ENCOUNTER — Telehealth: Payer: Self-pay

## 2023-05-11 LAB — URINALYSIS W MICROSCOPIC + REFLEX CULTURE
Bacteria, UA: NONE SEEN /HPF
Bilirubin Urine: NEGATIVE
Hgb urine dipstick: NEGATIVE
Hyaline Cast: NONE SEEN /LPF
Leukocyte Esterase: NEGATIVE
Nitrites, Initial: NEGATIVE
Protein, ur: NEGATIVE
RBC / HPF: NONE SEEN /HPF (ref 0–2)
Specific Gravity, Urine: 1.039 — ABNORMAL HIGH (ref 1.001–1.035)
pH: <=5 (ref 5.0–8.0)

## 2023-05-11 LAB — NO CULTURE INDICATED

## 2023-05-11 NOTE — Telephone Encounter (Signed)
-----   Message from [facility] sent at 05/10/2023  4:18 PM EDT ----- Regarding: PA Could you please start a urgent PA on Ozempic 0.25mg  weekly for type 2 diabetes for pt listed above thanks.

## 2023-05-11 NOTE — Telephone Encounter (Signed)
 [facility] need PA, please complete ASAP. Thanks

## 2023-05-11 NOTE — Telephone Encounter (Deleted)
 Pharmacy Patient Advocate Encounter   Received notification from Physician's Office that prior authorization for Mounjaro 2.5MG /0.5ML auto-injectors is required/requested.   Insurance verification completed.   The patient is insured through [facility] .   Per test claim:  BYETTA PEN, TRULICITY PEN, VICTOZA PEN, OZEMPIC PEN is preferred by the insurance.  If suggested medication is appropriate, Please send in a new RX and discontinue this one. If not, please advise as to why it's not appropriate so that we may request a Prior Authorization. Please note, some preferred medications may still require a PA.  If the suggested medications have not been trialed and there are no contraindications to their use, the PA will not be submitted, as it will not be approved.   There is another prescription for Ozempic on patient's chart. Will submit PA for Ozempic in separate encounter.

## 2023-05-11 NOTE — Telephone Encounter (Signed)
 Pharmacy Patient Advocate Encounter   Received notification from Physician's Office that prior authorization for Ozempic (0.25 or 0.5 MG/DOSE) 2MG /3ML pen-injectors is required/requested.   Insurance verification completed.   The patient is insured through [facility] .   Per test claim: PA required; PA submitted to above mentioned insurance via CoverMyMeds Key/confirmation #/EOC [id number] Status is pending

## 2023-05-12 ENCOUNTER — Other Ambulatory Visit (HOSPITAL_BASED_OUTPATIENT_CLINIC_OR_DEPARTMENT_OTHER): Payer: Self-pay

## 2023-05-12 ENCOUNTER — Other Ambulatory Visit (HOSPITAL_COMMUNITY): Payer: Self-pay

## 2023-05-12 ENCOUNTER — Telehealth: Payer: Self-pay

## 2023-05-12 NOTE — Telephone Encounter (Signed)
 Pharmacy Patient Advocate Encounter  Received notification from [facility] that Prior Authorization for Ozempic (0.25 or 0.5 MG/DOSE) 2MG /3ML pen-injectors  has been APPROVED from 05/11/23 to 05/10/24. Unable to obtain price due to refill too soon rejection, last fill date 05/12/23 next available fill date4/30/25   PA #/Case ID/Reference #: [id number]

## 2023-05-12 NOTE — Telephone Encounter (Signed)
 Patient's review of lab results/notes confirmed.

## 2023-05-12 NOTE — Telephone Encounter (Signed)
 Copied from CRM [id number]. Topic: General - Other >> May 12, 2023 10:54 AM Elizebeth Brooking wrote: Reason for CRM: Patient called in returning a missed phone call from Brihanna Devenport, would like for her to give her a callback.     Called patient back and spoke with her. Informed her that she is approved for Ozempic and to only take that for now. Also, that the PA is processing for Marcelline Deist but to hold off on Farxiga for now per Dr. Jon Billings. Donzetta Starch, CMA

## 2023-05-12 NOTE — Telephone Encounter (Signed)
 Good morning her PA for Ozempic was approved and her copay is $4.00, I did not see an order for [facility], and the message said to please complete PA for [facility].

## 2023-05-12 NOTE — Telephone Encounter (Signed)
 Per Solmon Ice, she has already sent a message to the PA team to start a prior authorization for [place]. Also, patient is approved for Ozempic.

## 2023-05-13 ENCOUNTER — Ambulatory Visit

## 2023-05-13 NOTE — Progress Notes (Signed)
 Patient was given instructions on how to administer Ozempic injection and she administered the injection herself. Dr. Jon Billings and I supervised. Donzetta Starch, CMA

## 2023-05-14 ENCOUNTER — Other Ambulatory Visit (HOSPITAL_COMMUNITY): Payer: Self-pay

## 2023-05-14 ENCOUNTER — Telehealth: Payer: Self-pay

## 2023-05-14 NOTE — Telephone Encounter (Signed)
 Pt is aware and has pick up rx

## 2023-05-14 NOTE — Telephone Encounter (Signed)
 MyChart msg sent to patient advising of approval. Last Rx picked up 05/12/23. Nothing further needed at this time

## 2023-05-14 NOTE — Telephone Encounter (Signed)
 Pharmacy Patient Advocate Encounter   Received notification from CoverMyMeds that prior authorization for Farxiga 5MG  tablets is required/requested.   Insurance verification completed.   The patient is insured through [employer] .   Per test claim: PA required; PA submitted to above mentioned insurance via CoverMyMeds Key/confirmation #/EOC [id number] Status is pending

## 2023-05-14 NOTE — Telephone Encounter (Signed)
 Pharmacy Patient Advocate Encounter   Received notification from CoverMyMeds that prior authorization for Farxiga 5MG  tablets is required/requested.   Insurance verification completed.   The patient is insured through [facility] .   Per test claim: PA required; PA started via CoverMyMeds. KEY [id number] . Waiting for clinical questions to populate.

## 2023-05-17 ENCOUNTER — Other Ambulatory Visit (HOSPITAL_COMMUNITY): Payer: Self-pay

## 2023-05-17 ENCOUNTER — Other Ambulatory Visit: Payer: Self-pay

## 2023-05-17 ENCOUNTER — Other Ambulatory Visit (HOSPITAL_BASED_OUTPATIENT_CLINIC_OR_DEPARTMENT_OTHER): Payer: Self-pay

## 2023-05-17 NOTE — Telephone Encounter (Signed)
 Pharmacy Patient Advocate Encounter  Received notification from  [employer]  that Prior Authorization for Farxiga 5MG  tablets  has been APPROVED from 05/14/23 to 05/13/24. Ran test claim, Copay is $4. This test claim was processed through [facility]- copay amounts may vary at other pharmacies due to pharmacy/plan contracts, or as the patient moves through the different stages of their insurance plan.   PA #/Case ID/Reference #: [id number]

## 2023-05-18 ENCOUNTER — Other Ambulatory Visit (HOSPITAL_COMMUNITY): Payer: Self-pay

## 2023-05-21 NOTE — Telephone Encounter (Signed)
 Pharmacy Patient Advocate Encounter  Received notification from [facility] that Prior Authorization for Farxiga  5MG  tablets has been APPROVED from 05/14/2023 to 05/13/2024   PA #/Case ID/Reference #: [id number]

## 2023-06-09 ENCOUNTER — Encounter: Payer: Self-pay | Admitting: Internal Medicine

## 2023-06-09 ENCOUNTER — Other Ambulatory Visit (HOSPITAL_BASED_OUTPATIENT_CLINIC_OR_DEPARTMENT_OTHER): Payer: Self-pay

## 2023-06-09 ENCOUNTER — Ambulatory Visit: Admitting: Internal Medicine

## 2023-06-09 VITALS — BP 95/63 | HR 86 | Temp 97.6°F | Resp 16 | Ht 64.0 in | Wt 155.4 lb

## 2023-06-09 DIAGNOSIS — H547 Unspecified visual loss: Secondary | ICD-10-CM

## 2023-06-09 DIAGNOSIS — Z780 Asymptomatic menopausal state: Secondary | ICD-10-CM

## 2023-06-09 DIAGNOSIS — E1165 Type 2 diabetes mellitus with hyperglycemia: Secondary | ICD-10-CM | POA: Diagnosis not present

## 2023-06-09 DIAGNOSIS — F418 Other specified anxiety disorders: Secondary | ICD-10-CM

## 2023-06-09 DIAGNOSIS — R350 Frequency of micturition: Secondary | ICD-10-CM

## 2023-06-09 DIAGNOSIS — E559 Vitamin D deficiency, unspecified: Secondary | ICD-10-CM | POA: Diagnosis not present

## 2023-06-09 LAB — POCT URINALYSIS DIPSTICK
Bilirubin, UA: NEGATIVE
Blood, UA: NEGATIVE
Glucose, UA: POSITIVE — AB
Leukocytes, UA: NEGATIVE
Nitrite, UA: NEGATIVE
Odor: NEGATIVE
Protein, UA: NEGATIVE
Spec Grav, UA: 1.025 (ref 1.010–1.025)
Urobilinogen, UA: 0.2 U/dL
pH, UA: 5 (ref 5.0–8.0)

## 2023-06-09 MED ORDER — VITAMIN D (ERGOCALCIFEROL) 1.25 MG (50000 UNIT) PO CAPS
50000.0000 [IU] | ORAL_CAPSULE | ORAL | 1 refills | Status: DC
  Filled 2023-06-09: qty 12, 84d supply, fill #0
  Filled 2023-09-01: qty 12, 84d supply, fill #1

## 2023-06-09 MED ORDER — OZEMPIC (0.25 OR 0.5 MG/DOSE) 2 MG/3ML ~~LOC~~ SOPN
0.5000 mg | PEN_INJECTOR | SUBCUTANEOUS | 2 refills | Status: DC
  Filled 2023-06-09: qty 3, 28d supply, fill #0
  Filled 2023-08-03: qty 3, 28d supply, fill #1
  Filled 2023-09-01: qty 3, 28d supply, fill #2

## 2023-06-09 NOTE — Assessment & Plan Note (Signed)
 Blood glucose levels have significantly improved, now within the 100s range, down from 300-500s. A1c, previously at 11, is expected to decrease to 7 with current management. There is no evidence of nephropathy. Vision changes are likely due to initial glycemic control improvements causing temporary eye swelling. She is at high risk for cataracts and other complications due to past hyperglycemia. Current treatment with semaglutide  and Farxiga  is effective, with no side effects reported. To prevent future complications such as neuropathy, retinopathy, and cardiovascular issues, continue the current dose of semaglutide , but send in a higher dose prescription for future use if needed. Continue Farxiga . Check urine for UTI and send antibiotics if positive. Provide more glucose test strips. Refer to an ophthalmologist for vision assessment. Recheck A1c in two months. Follow up in two months for diabetes and depression management.

## 2023-06-09 NOTE — Assessment & Plan Note (Signed)
 Offered estrogen replacement therapy

## 2023-06-09 NOTE — Assessment & Plan Note (Signed)
 Blurred vision is possibly related to recent glycemic control improvements causing temporary eye swelling. She is at high risk for cataracts due to past hyperglycemia. A previous eye exam was inconclusive. Regular ophthalmologic examinations are recommended to monitor for potential complications. Refer to an ophthalmologist for a comprehensive vision assessment.

## 2023-06-09 NOTE — Assessment & Plan Note (Signed)
 Will check for urinary tract infection (UTI) with farxiga  she has some symptom(s).

## 2023-06-09 NOTE — Progress Notes (Unsigned)
 ==============================  [facility]: [id number]   -- Medical Office Visit --  Patient: [name]      Age: 48 y.o.       Sex:  female  Date:   06/09/2023 Today's Healthcare Provider: Anthon Kins, MD  ==============================   Chief Complaint: Medical Management of Chronic Issues (1 month follow-up/)  History of Present Illness 48 year old female with diabetes who presents for follow-up on blood sugar control and vision issues.  She is managing her diabetes with injectable medication, which has significantly improved her blood sugar levels, now consistently in the 'one something range', down from previous levels of 300 to 500. She is adjusting to the medication schedule and has noticed weight loss, which she attributes to the treatment. She is also taking Farxiga  without significant side effects, though she experiences increased urination.  She experiences blurred vision, particularly noticeable when reading. She previously visited an eye doctor but did not receive follow-up. She is concerned about the potential impact of her diabetes on her vision, as she has a family history of diabetes-related complications.  She has been prescribed Lexapro  for mood improvement but admits to inconsistent use, having missed several doses. She attributes some mood improvement to better blood sugar control. She reports no side effects from Lexapro  and is attempting to take it more regularly.  She mentions not receiving her vitamin D  prescription, which was not covered by insurance. She understands the importance of vitamin D  for energy, mood, and bone health, especially post-menopause.  BMI Readings from Last 5 Encounters:  No data found for BMI  Can't pull weight in smart phrases or see prior visit due to security lock on this chart   Lab Results  Component Value Date   HGBA1C 11.4 (H) 05/10/2023   HGBA1C 7.0 (H) 01/26/2023   HGBA1C  6.5 08/11/2022    Background Reviewed: Problem List: has Iron  deficiency anemia due to chronic blood loss; Vitamin D  deficiency; Mixed anxiety and depressive disorder; Allergic rhinitis; Type 2 diabetes mellitus with hyperglycemia, without long-term current use of insulin  (HCC); Mild intermittent asthma without complication; Attention or concentration deficit; Chronic pain after hysterectomy; PTSD (post-traumatic stress disorder); Central adiposity; Acne; Leukopenia; Postmenopausal estrogen deficiency; Urinary frequency; and Vision impairment on their problem list. Past Medical History:  has a past medical history of Anemia, Asthma, Blood transfusion without reported diagnosis, Gestational diabetes, Postoperative anemia due to acute blood loss (10/09/2021), and Type 2 diabetes mellitus (HCC). Past Surgical History:   has a past surgical history that includes arm surgery; Dilation and curettage of uterus; Cesarean section; Tooth extraction; Cervical cerclage; IR Radiologist Eval & Mgmt (09/04/2021); and Hysterectomy abdominal with salpingectomy (Bilateral, 10/08/2021). Social History:   reports that she has never smoked. She has never used smokeless tobacco. She reports that she does not drink alcohol and does not use drugs. Family History:  family history includes Breast cancer in her maternal aunt; Hypertension in her brother, maternal uncle, and mother. Allergies:  is allergic to shellfish allergy, other, and latex.   Medication Reconciliation: Current Outpatient Medications on File Prior to Visit  Medication Sig   Accu-Chek Softclix Lancets lancets Use as instructed   albuterol  (VENTOLIN  HFA) 108 (90 Base) MCG/ACT inhaler Inhale 2 puffs into the lungs every 6 (six) hours as needed for wheezing or shortness of breath.   amphetamine -dextroamphetamine  (ADDERALL XR) 5 MG 24 hr capsule Take 1 capsule (5 mg total) by mouth daily.   Blood  Glucose Monitoring Suppl (ACCU-CHEK GUIDE) w/Device KIT Use to test  blood sugar daily   Calcium Carb-Cholecalciferol  (CALCIUM + D3 PO) Take 1 tablet by mouth daily. When able to remember   cetirizine  (ZYRTEC  ALLERGY) 10 MG tablet Take 1 tablet (10 mg total) by mouth daily.   Cholecalciferol  (VITAMIN D3) 125 MCG (5000 UT) CAPS Take 1 capsule (5,000 Units total) by mouth daily.   clindamycin  (CLINDAGEL ) 1 % gel Apply topically 2 (two) times daily.   Cyanocobalamin  (B-12) 1000 MCG TBCR Take 1,000 mcg by mouth daily. When able to remember   dapagliflozin  propanediol (FARXIGA ) 5 MG TABS tablet Take 1 tablet (5 mg total) by mouth daily before breakfast.   escitalopram  (LEXAPRO ) 10 MG tablet Take 1 tablet (10 mg total) by mouth daily. Take half tablet for first 2 weeks to start.   fluticasone  (FLONASE ) 50 MCG/ACT nasal spray Place 1 spray into both nostrils in the morning and at bedtime.   glucose blood (ACCU-CHEK GUIDE TEST) test strip Use to test blood sugar daily   glucose blood (ACCU-CHEK GUIDE) test strip daily   glucose blood test strip Use as instructed   Lancets (FREESTYLE) lancets Use as instructed   ondansetron  (ZOFRAN -ODT) 4 MG disintegrating tablet Dissolve 1 tablet under the tongue every 8 (eight) hours as needed for nausea or vomiting.   ondansetron  (ZOFRAN -ODT) 4 MG disintegrating tablet Dissolve 1 tablet under the tongue every 6 (six) hours as needed for nausea.   Saline (SIMPLY SALINE) 0.9 % AERS Place 1 spray into the nose daily at 6 (six) AM.   tretinoin  (RETIN-A ) 0.025 % cream Apply topically at bedtime.   No current facility-administered medications on file prior to visit.   Medications Discontinued During This Encounter  Medication Reason   glipiZIDE  (GLUCOTROL ) 5 MG tablet    metFORMIN  (GLUCOPHAGE ) 500 MG tablet    tirzepatide  (MOUNJARO ) 2.5 MG/0.5ML Pen    Semaglutide ,0.25 or 0.5MG /DOS, (OZEMPIC , 0.25 OR 0.5 MG/DOSE,) 2 MG/3ML SOPN Reorder     Physical Exam:    06/09/2023   11:54 AM 05/10/2023    1:33 PM 04/22/2023    1:22 PM  Vitals  with BMI  Height 5\' 4"  5\' 4"  5\' 4"   Weight 155 lbs 6 oz 158 lbs 6 oz 167 lbs 4 oz  BMI 26.66 27.18 28.69  Systolic 95 120 124  Diastolic 63 64 81  Pulse 86 68 69  Vital signs reviewed.  Nursing notes reviewed. Weight trend reviewed. Physical Exam General Appearance:  No acute distress appreciable.   Well-groomed, healthy-appearing female.  Well proportioned with no abnormal fat distribution.  Good muscle tone. Pulmonary:  Normal work of breathing at rest, no respiratory distress apparent. SpO2: 95 %  Musculoskeletal: All extremities are intact.  Neurological:  Awake, alert, oriented, and engaged.  No obvious focal neurological deficits or cognitive impairments.  Sensorium seems unclouded.   Speech is clear and coherent with logical content. Psychiatric:  Appropriate mood, pleasant and cooperative demeanor, thoughtful and engaged during the exam Physical Exam MEASUREMENTS: Weight- 155.6.  No results found for this or any previous visit.     No results found for any visits on 06/09/23. No visits with results within 1 Year(s) from this visit.  Latest known visit with results is:  No results found for any previous visit.  No image results found. No results found.      06/09/2023   12:06 PM 05/10/2023    1:47 PM 03/17/2023    4:47 PM 11/10/2022  4:23 PM  PHQ 2/9 Scores  PHQ - 2 Score 3 6 5 4   PHQ- 9 Score 18 24 13 11    Results LABS A1c: 11% (05/10/2023)    Lab Results  Component Value Date   COLORU yellow 06/09/2023   CLARITYU clear 06/09/2023   GLUCOSEUR Positive (A) 06/09/2023   BILIRUBINUR negative 06/09/2023   KETONESU +-5 06/09/2023   SPECGRAV 1.025 06/09/2023   RBCUR Negative 06/09/2023   PHUR 5.0 06/09/2023   PROTEINUR Negative 06/09/2023   UROBILINOGEN 0.2 06/09/2023   LEUKOCYTESUR Negative 06/09/2023    Assessment & Plan Vision impairment Blurred vision is possibly related to recent glycemic control improvements causing temporary eye swelling. She is at  high risk for cataracts due to past hyperglycemia. A previous eye exam was inconclusive. Regular ophthalmologic examinations are recommended to monitor for potential complications. Refer to an ophthalmologist for a comprehensive vision assessment. Type 2 diabetes mellitus with hyperglycemia, without long-term current use of insulin  (HCC) Blood glucose levels have significantly improved, now within the 100s range, down from 300-500s. A1c, previously at 11, is expected to decrease to 7 with current management. There is no evidence of nephropathy. Vision changes are likely due to initial glycemic control improvements causing temporary eye swelling. She is at high risk for cataracts and other complications due to past hyperglycemia. Current treatment with semaglutide  and Farxiga  is effective, with no side effects reported. To prevent future complications such as neuropathy, retinopathy, and cardiovascular issues, continue the current dose of semaglutide , but send in a higher dose prescription for future use if needed. Continue Farxiga . Check urine for UTI and send antibiotics if positive. Provide more glucose test strips. Refer to an ophthalmologist for vision assessment. Recheck A1c in two months. Follow up in two months for diabetes and depression management. Vitamin D  deficiency Vitamin D  prescription for 5000 is not covered by insurance. The importance of vitamin D  for energy, mood, and bone health is discussed. Weekly dosing aligns with the semaglutide  injection schedule to improve adherence. Send a prescription for a weekly vitamin D  dose to align with the semaglutide  injection schedule. Urinary frequency Will check for urinary tract infection (UTI) with farxiga  she has some symptom(s). Mixed anxiety and depressive disorder Depression has improved from severe to moderate, likely due to better glycemic control and semaglutide 's mood-enhancing effects. Inconsistent use of Lexapro  is noted, with no reported  side effects. Emphasize the importance of consistent medication use for mood stabilization. Inform her that sudden discontinuation of Lexapro  can lead to severe mood deterioration. Encourage consistent daily use of Lexapro . Discuss potential benefits of estrogen replacement therapy, but she opts to wait. Postmenopausal estrogen deficiency Offered estrogen replacement therapy        Orders Placed During this Encounter:   Orders Placed This Encounter  Procedures   Ambulatory referral to Ophthalmology    Referral Priority:   Routine    Referral Type:   Consultation    Referral Reason:   Specialty Services Required    Requested Specialty:   Ophthalmology    Number of Visits Requested:   1   POCT urinalysis dipstick   Meds ordered this encounter  Medications   Vitamin D , Ergocalciferol , (DRISDOL) 1.25 MG (50000 UNIT) CAPS capsule    Sig: Take 1 capsule (50,000 Units total) by mouth every 7 (seven) days.    Dispense:  12 capsule    Refill:  1   Semaglutide ,0.25 or 0.5MG /DOS, (OZEMPIC , 0.25 OR 0.5 MG/DOSE,) 2 MG/3ML SOPN    Sig: Inject  0.5 mg into the skin once a week.    Dispense:  3 mL    Refill:  2   Medical Decision Making: 2 or more stable chronic illnesses Prescription drug management       This document was synthesized by artificial intelligence (Abridge) using HIPAA-compliant recording of the clinical interaction;   We discussed the use of AI scribe software for clinical note transcription with the patient, who gave verbal consent to proceed. additional Info: This encounter employed state-of-the-art, real-time, collaborative documentation. The patient actively reviewed and assisted in updating their electronic medical record on a shared screen, ensuring transparency and facilitating joint problem-solving for the problem list, overview, and plan. This approach promotes accurate, informed care. The treatment plan was discussed and reviewed in detail, including medication safety,  potential side effects, and all patient questions. We confirmed understanding and comfort with the plan. Follow-up instructions were established, including contacting the office for any concerns, returning if symptoms worsen, persist, or new symptoms develop, and precautions for potential emergency department visits.

## 2023-06-09 NOTE — Assessment & Plan Note (Signed)
 Vitamin D  prescription for 5000 is not covered by insurance. The importance of vitamin D  for energy, mood, and bone health is discussed. Weekly dosing aligns with the semaglutide  injection schedule to improve adherence. Send a prescription for a weekly vitamin D  dose to align with the semaglutide  injection schedule.

## 2023-06-09 NOTE — Assessment & Plan Note (Signed)
 Depression has improved from severe to moderate, likely due to better glycemic control and semaglutide 's mood-enhancing effects. Inconsistent use of Lexapro  is noted, with no reported side effects. Emphasize the importance of consistent medication use for mood stabilization. Inform her that sudden discontinuation of Lexapro  can lead to severe mood deterioration. Encourage consistent daily use of Lexapro . Discuss potential benefits of estrogen replacement therapy, but she opts to wait.

## 2023-06-09 NOTE — Patient Instructions (Signed)
 VISIT SUMMARY:  Today, we discussed your diabetes management, vision issues, mood, and vitamin D  deficiency. Your blood sugar levels have significantly improved with your current medication, and we addressed your concerns about blurred vision and mood stabilization. We also talked about the importance of vitamin D  for your overall health.  YOUR PLAN:  -TYPE 2 DIABETES MELLITUS WITH HYPERGLYCEMIA: Type 2 diabetes is a condition where your body does not use insulin  properly, leading to high blood sugar levels. Your blood sugar levels have improved significantly with your current medications, semaglutide  and Farxiga . Continue taking these medications as prescribed. We will check your urine for a possible infection and provide antibiotics if needed. You should also continue monitoring your blood sugar levels regularly, and we will recheck your A1c in two months. A referral to an ophthalmologist has been made to assess your vision.  -VISION IMPAIRMENT: Your blurred vision may be due to recent improvements in your blood sugar control, which can cause temporary eye swelling. You are at a higher risk for cataracts and other eye complications because of your past high blood sugar levels. Regular eye exams are important, and you have been referred to an ophthalmologist for a comprehensive vision assessment.  -MODERATE DEPRESSION: Depression is a mood disorder that can affect your daily life. Your mood has improved, likely due to better blood sugar control and your current medication. It is important to take Lexapro  consistently to help stabilize your mood. Missing doses can lead to a worsening of your symptoms. Please take Lexapro  daily as prescribed.  -VITAMIN D  DEFICIENCY: Vitamin D  is important for energy, mood, and bone health. You have not received your vitamin D  prescription because it was not covered by insurance. We have sent a new prescription for a weekly dose to align with your semaglutide  injection  schedule to help you remember to take it.  INSTRUCTIONS:  Please follow up in two months for diabetes and depression management. Make sure to attend your ophthalmologist appointment for a comprehensive vision assessment. Continue monitoring your blood sugar levels regularly and take your medications as prescribed. If you experience any new symptoms or have concerns, contact our office.  It was a pleasure seeing you today! Your health and satisfaction are our top priorities.  Scherrie Curt, MD  Your Providers PCP: Anthon Kins, MD,  [phone]) Referring Provider: Anthon Kins, MD,  [phone])     NEXT STEPS: [x]  Early Intervention: Schedule sooner appointment, call our on-call services, or go to emergency room if there is any significant Increase in pain or discomfort New or worsening symptoms Sudden or severe changes in your health [x]  Flexible Follow-Up: We recommend a Return in about 2 months (around 08/09/2023). for optimal routine care. This allows for progress monitoring and treatment adjustments. [x]  Preventive Care: Schedule your annual preventive care visit! It's typically covered by insurance and helps identify potential health issues early. [x]  Lab & X-ray Appointments: Incomplete tests scheduled today, or call to schedule. X-rays: [facility] (M-F, 8:30am-noon or 1pm-5pm). [x]  Medical Information Release: Sign a release form at front desk to obtain relevant medical information we don't have.  MAKING THE MOST OF OUR FOCUSED 20 MINUTE APPOINTMENTS: [x]   Clearly state your top concerns at the beginning of the visit to focus our discussion [x]   If you anticipate you will need more time, please inform the front desk during scheduling - we can book multiple appointments in the same week. [x]   If you have transportation problems- use our  convenient video appointments or ask about transportation support. [x]   We can get down to business faster if you use  MyChart to update information before the visit and submit non-urgent questions before your visit. Thank you for taking the time to provide details through MyChart.  Let our nurse know and she can import this information into your encounter documents.  Arrival and Wait Times: [x]   Arriving on time ensures that everyone receives prompt attention. [x]   Early morning (8a) and afternoon (1p) appointments tend to have shortest wait times. [x]   Unfortunately, we cannot delay appointments for late arrivals or hold slots during phone calls.  Getting Answers and Following Up [x]   Simple Questions & Concerns: For quick questions or basic follow-up after your visit, reach us  at [phone] or MyChart messaging. [x]   Complex Concerns: If your concern is more complex, scheduling an appointment might be best. Discuss this with the staff to find the most suitable option. [x]   Lab & Imaging Results: We'll contact you directly if results are abnormal or you don't use MyChart. Most normal results will be on MyChart within 2-3 business days, with a review message from Dr. Boston Byers. Haven't heard back in 2 weeks? Need results sooner? Contact us  at [phone]. [x]   Referrals: Our referral coordinator will manage specialist referrals. The specialist's office should contact you within 2 weeks to schedule an appointment. Call us  if you haven't heard from them after 2 weeks.  Staying Connected [x]   MyChart: Activate your MyChart for the fastest way to access results and message us . See the last page of this paperwork for instructions on how to activate.  Bring to Your Next Appointment [x]   Medications: Please bring all your medication bottles to your next appointment to ensure we have an accurate record of your prescriptions. [x]   Health Diaries: If you're monitoring any health conditions at home, keeping a diary of your readings can be very helpful for discussions at your next appointment.  Billing [x]   X-ray &  Lab Orders: These are billed by separate companies. Contact the invoicing company directly for questions or concerns. [x]   Visit Charges: Discuss any billing inquiries with our administrative services team.  Your Satisfaction Matters [x]   Share Your Experience: We strive for your satisfaction! If you have any complaints, or preferably compliments, please let Dr. Boston Byers know directly or contact our Practice Administrators, Olinda Bertrand or [employer], by asking at the front desk.   Reviewing Your Records [x]   Review this early draft of your clinical encounter notes below and the final encounter summary tomorrow on MyChart after its been completed.  All orders placed so far are visible here: Vision impairment -     Ambulatory referral to Ophthalmology  Type 2 diabetes mellitus with hyperglycemia, without long-term current use of insulin  (HCC)  Vitamin D  deficiency  Urinary frequency

## 2023-07-23 ENCOUNTER — Ambulatory Visit: Admitting: Family Medicine

## 2023-08-03 ENCOUNTER — Other Ambulatory Visit (HOSPITAL_BASED_OUTPATIENT_CLINIC_OR_DEPARTMENT_OTHER): Payer: Self-pay

## 2023-08-09 ENCOUNTER — Encounter: Payer: Self-pay | Admitting: Internal Medicine

## 2023-08-09 ENCOUNTER — Other Ambulatory Visit: Payer: Self-pay

## 2023-08-09 ENCOUNTER — Other Ambulatory Visit (HOSPITAL_BASED_OUTPATIENT_CLINIC_OR_DEPARTMENT_OTHER): Payer: Self-pay

## 2023-08-09 ENCOUNTER — Ambulatory Visit: Admitting: Internal Medicine

## 2023-08-09 VITALS — BP 110/74 | HR 68 | Temp 98.0°F | Ht 64.0 in | Wt 151.6 lb

## 2023-08-09 DIAGNOSIS — M20011 Mallet finger of right finger(s): Secondary | ICD-10-CM

## 2023-08-09 DIAGNOSIS — R102 Pelvic and perineal pain: Secondary | ICD-10-CM

## 2023-08-09 DIAGNOSIS — E1165 Type 2 diabetes mellitus with hyperglycemia: Secondary | ICD-10-CM | POA: Diagnosis not present

## 2023-08-09 DIAGNOSIS — L910 Hypertrophic scar: Secondary | ICD-10-CM

## 2023-08-09 DIAGNOSIS — B351 Tinea unguium: Secondary | ICD-10-CM

## 2023-08-09 DIAGNOSIS — Z803 Family history of malignant neoplasm of breast: Secondary | ICD-10-CM

## 2023-08-09 DIAGNOSIS — L299 Pruritus, unspecified: Secondary | ICD-10-CM

## 2023-08-09 DIAGNOSIS — I781 Nevus, non-neoplastic: Secondary | ICD-10-CM

## 2023-08-09 MED ORDER — CICLOPIROX OLAMINE 0.77 % EX CREA
1.0000 | TOPICAL_CREAM | Freq: Two times a day (BID) | CUTANEOUS | 1 refills | Status: AC
  Filled 2023-08-09: qty 30, 30d supply, fill #0

## 2023-08-09 MED ORDER — TERBINAFINE HCL 250 MG PO TABS
250.0000 mg | ORAL_TABLET | Freq: Every day | ORAL | 0 refills | Status: AC
  Filled 2023-08-09: qty 34, 34d supply, fill #0

## 2023-08-09 MED ORDER — FREESTYLE LIBRE 3 PLUS SENSOR MISC
5 refills | Status: DC
  Filled 2023-08-09: qty 1, 15d supply, fill #0

## 2023-08-09 MED ORDER — NYSTATIN-TRIAMCINOLONE 100000-0.1 UNIT/GM-% EX OINT
1.0000 | TOPICAL_OINTMENT | Freq: Two times a day (BID) | CUTANEOUS | 0 refills | Status: DC
  Filled 2023-08-09: qty 30, 15d supply, fill #0

## 2023-08-09 NOTE — Assessment & Plan Note (Signed)
 In grandmother.

## 2023-08-09 NOTE — Patient Instructions (Signed)
 It was a pleasure seeing you today! Your health and satisfaction are our top priorities.  Bernardino Cone, MD  Your Providers PCP: Cone Bernardino MATSU, MD,  [phone]) Referring Provider: Cone Bernardino MATSU, MD,  [phone])     NEXT STEPS: [x]  Early Intervention: Schedule sooner appointment, call our on-call services, or go to emergency room if there is any significant Increase in pain or discomfort New or worsening symptoms Sudden or severe changes in your health [x]  Flexible Follow-Up: We recommend a No follow-ups on file. for optimal routine care. This allows for progress monitoring and treatment adjustments. [x]  Preventive Care: Schedule your annual preventive care visit! It's typically covered by insurance and helps identify potential health issues early. [x]  Lab & X-ray Appointments: Incomplete tests scheduled today, or call to schedule. X-rays: [facility] (M-F, 8:30am-noon or 1pm-5pm). [x]  Medical Information Release: Sign a release form at front desk to obtain relevant medical information we don't have.  MAKING THE MOST OF OUR FOCUSED 20 MINUTE APPOINTMENTS: [x]   Clearly state your top concerns at the beginning of the visit to focus our discussion [x]   If you anticipate you will need more time, please inform the front desk during scheduling - we can book multiple appointments in the same week. [x]   If you have transportation problems- use our convenient video appointments or ask about transportation support. [x]   We can get down to business faster if you use MyChart to update information before the visit and submit non-urgent questions before your visit. Thank you for taking the time to provide details through MyChart.  Let our nurse know and she can import this information into your encounter documents.  Arrival and Wait Times: [x]   Arriving on time ensures that everyone receives prompt attention. [x]   Early morning (8a) and afternoon (1p) appointments tend to  have shortest wait times. [x]   Unfortunately, we cannot delay appointments for late arrivals or hold slots during phone calls.  Getting Answers and Following Up [x]   Simple Questions & Concerns: For quick questions or basic follow-up after your visit, reach us  at [phone] or MyChart messaging. [x]   Complex Concerns: If your concern is more complex, scheduling an appointment might be best. Discuss this with the staff to find the most suitable option. [x]   Lab & Imaging Results: We'll contact you directly if results are abnormal or you don't use MyChart. Most normal results will be on MyChart within 2-3 business days, with a review message from Dr. Cone. Haven't heard back in 2 weeks? Need results sooner? Contact us  at [phone]. [x]   Referrals: Our referral coordinator will manage specialist referrals. The specialist's office should contact you within 2 weeks to schedule an appointment. Call us  if you haven't heard from them after 2 weeks.  Staying Connected [x]   MyChart: Activate your MyChart for the fastest way to access results and message us . See the last page of this paperwork for instructions on how to activate.  Bring to Your Next Appointment [x]   Medications: Please bring all your medication bottles to your next appointment to ensure we have an accurate record of your prescriptions. [x]   Health Diaries: If you're monitoring any health conditions at home, keeping a diary of your readings can be very helpful for discussions at your next appointment.  Billing [x]   X-ray & Lab Orders: These are billed by separate companies. Contact the invoicing company directly for questions or concerns. [x]   Visit Charges: Discuss any billing inquiries with our administrative services  team.  Your Satisfaction Matters [x]   Share Your Experience: We strive for your satisfaction! If you have any complaints, or preferably compliments, please let Dr. Jesus know directly or contact our Practice  Administrators, Manuelita Rubin or [employer], by asking at the front desk.   Reviewing Your Records [x]   Review this early draft of your clinical encounter notes below and the final encounter summary tomorrow on MyChart after its been completed.  All orders placed so far are visible here: Type 2 diabetes mellitus with hyperglycemia, without long-term current use of insulin  (HCC) -     Ambulatory referral to Ophthalmology -     Ambulatory referral to Podiatry -     CBC with Differential/Platelet -     Comprehensive metabolic panel with GFR -     Lipid panel -     Hemoglobin A1c -     Microalbumin / creatinine urine ratio -     FreeStyle Libre 3 Plus Sensor; Change sensor every 15 days.  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  Dispense: 30 g; Refill: 0  Pelvic cramping -     Ambulatory referral to Gynecology  Onychomycosis -     Ciclopirox  Olamine; Apply 1 Application topically 2 (two) times daily.  Dispense: 90 g; Refill: 1 -     Terbinafine  HCl; Take 1 tablet (250 mg total) by mouth daily. For toenail fungal infection  Dispense: 84 tablet; Refill: 0  Spider veins of limb

## 2023-08-09 NOTE — Assessment & Plan Note (Signed)
 She has spider veins, which are benign but cosmetically concerning and associated with increased estrogen levels. Discuss sclerotherapy as a treatment option, noting it is expensive, considered cosmetic, and not typically covered by insurance.

## 2023-08-09 NOTE — Assessment & Plan Note (Signed)
 test content

## 2023-08-09 NOTE — Assessment & Plan Note (Signed)
 Terbinafine  and cyclopirox prescribed

## 2023-08-09 NOTE — Assessment & Plan Note (Signed)
 Pelvic cramping post-hysterectomy is likely due to surgical adhesions. The cramping is not dangerous but uncomfortable. She requests evaluation by a new gynecologist, avoiding previous providers. Refer to a new gynecologist for evaluation.

## 2023-08-09 NOTE — Assessment & Plan Note (Signed)
 She has mallet finger, likely due to a torn ligament or tendon, present for approximately 10 years. It is not causing significant functional impairment but may benefit from evaluation by a hand specialist. Refer to hand surgery for evaluation.

## 2023-08-09 NOTE — Progress Notes (Signed)
 ==============================  [facility]: [phone]   -- Medical Office Visit --  Patient: [name]      Age: 48 y.o.       Sex:  female  Date:   08/09/2023 Today's Healthcare Provider: Bernardino KANDICE Cone, MD  ==============================   Chief Complaint: Diabetes  Discussed the use of AI scribe software for clinical note transcription with the patient, who gave verbal consent to proceed.  History of Present Illness The patient presents with diabetes management and concerns about varicose veins.  She is focused on managing her diabetes, working on improving her eating habits and exercise. She regularly checks her blood sugar levels, with fasting levels typically between 98 and 100 mg/dL. Her previous A1c levels increased from 7 to 11.4, but her current blood sugar readings are generally between 100 and 200 mg/dL. She has not been using a continuous glucose monitor recently due to discomfort.  She has concerns about varicose veins and spider veins, initially evaluated by a specialist when she was diagnosed with diabetes approximately two years ago. She is interested in learning more about treatment options for these veins.  She experiences itching over her abdominal incision site from a hysterectomy performed approximately two years ago. She has tried using bio oil and changing soaps to alleviate the itching but has not used steroid creams yet. She also reports persistent pelvic cramping since her hysterectomy and is interested in seeing a new gynecologist for further evaluation.  She has a family history of breast cancer, with her grandmother having had the disease. She has had mammograms in the past, with the last one being within the past three years. No lumps or bumps in her breasts.  She reports a history of a broken right middle finger, which occurred around 2014 or 2015. She describes it as a mallet finger, and she can still move  it.  Lab Results  Component Value Date   HGBA1C 11.4 (H) 05/10/2023   HGBA1C 7.0 (H) 01/26/2023   HGBA1C 6.5 08/11/2022   Doesn't like continuous glucose monitor.    Health Maintenance Due  Topic Date Due   Hepatitis B Vaccines (1 of 3 - 19+ 3-dose series) Never done   OPHTHALMOLOGY EXAM  12/19/2022   FOOT EXAM  04/10/2023   Health Maintenance  Topic Date Due   Hepatitis B Vaccines (1 of 3 - 19+ 3-dose series) Never done   OPHTHALMOLOGY EXAM  12/19/2022   FOOT EXAM  04/10/2023   COVID-19 Vaccine (3 - Pfizer risk series) 08/25/2023 (Originally 10/12/2019)   DTaP/Tdap/Td (1 - Tdap) 02/01/2024 (Originally 10/18/1992)   Pneumococcal Vaccine 40-80 Years old (1 of 2 - PCV) 03/16/2024 (Originally 10/18/1992)   INFLUENZA VACCINE  09/03/2023   HEMOGLOBIN A1C  11/09/2023   Diabetic kidney evaluation - eGFR measurement  05/09/2024   Diabetic kidney evaluation - Urine ACR  05/09/2024   Fecal DNA (Cologuard)  06/18/2025   Hepatitis C Screening  Completed   HIV Screening  Completed   HPV VACCINES  Aged Out   Meningococcal B Vaccine  Aged Out    Background Reviewed: Problem List: has Iron  deficiency anemia due to chronic blood loss; Vitamin D  deficiency; Mixed anxiety and depressive disorder; Allergic rhinitis; Type 2 diabetes mellitus with hyperglycemia, without long-term current use of insulin  (HCC); Mild intermittent asthma without complication; Attention or concentration deficit; 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 Past Medical History:  has a past medical history of Anemia, Asthma, Blood transfusion without reported diagnosis, Gestational diabetes, Postoperative anemia  due to acute blood loss (10/09/2021), and Type 2 diabetes mellitus (HCC). Past Surgical History:   has a past surgical history that includes arm surgery; Dilation and curettage of uterus; Cesarean section; Tooth extraction; Cervical cerclage; IR Radiologist Eval & Mgmt (09/04/2021); and Hysterectomy abdominal with salpingectomy (Bilateral, 10/08/2021). Social History:   reports that she has never smoked. She has never used smokeless tobacco. She reports that she does not drink alcohol and does not use drugs. Family History:  family history includes Breast cancer in her maternal aunt; Hypertension in her brother, maternal uncle, and mother. Allergies:  is allergic to shellfish allergy, other, and latex.   Medication Reconciliation: Current Outpatient Medications on File Prior to Visit  Medication Sig   Accu-Chek Softclix Lancets lancets Use as instructed   albuterol  (VENTOLIN  HFA) 108 (90 Base) MCG/ACT inhaler Inhale 2 puffs into the lungs every 6 (six) hours as needed for wheezing or shortness of breath.   amphetamine -dextroamphetamine  (ADDERALL XR) 5 MG 24 hr capsule Take 1 capsule (5 mg total) by mouth daily.   Blood Glucose Monitoring Suppl (ACCU-CHEK GUIDE) w/Device KIT Use to test blood sugar daily   Calcium Carb-Cholecalciferol  (CALCIUM + D3 PO) Take 1 tablet by mouth daily. When able to remember   cetirizine  (ZYRTEC  ALLERGY) 10 MG tablet Take 1 tablet (10 mg total) by mouth daily.   Cholecalciferol  (VITAMIN D3) 125 MCG (5000 UT) CAPS Take 1 capsule (5,000 Units total) by mouth daily.   clindamycin  (CLINDAGEL ) 1 % gel Apply topically 2 (two) times daily.   Cyanocobalamin  (B-12) 1000 MCG TBCR Take 1,000 mcg by mouth daily. When able to remember   dapagliflozin  propanediol (FARXIGA ) 5 MG TABS tablet Take 1 tablet (5 mg total) by mouth daily before breakfast.   fluticasone  (FLONASE ) 50 MCG/ACT nasal spray Place 1 spray into both nostrils in the morning and at bedtime.   glucose blood (ACCU-CHEK  GUIDE TEST) test strip Use to test blood sugar daily   glucose blood (ACCU-CHEK GUIDE) test strip daily   glucose blood test strip Use as instructed   Lancets (FREESTYLE) lancets Use as instructed   ondansetron  (ZOFRAN -ODT) 4 MG disintegrating tablet Dissolve 1 tablet under the tongue every 8 (eight) hours as needed for nausea or vomiting.   ondansetron  (ZOFRAN -ODT) 4 MG disintegrating tablet Dissolve 1 tablet under the tongue every 6 (six) hours as needed for nausea.   Saline (SIMPLY SALINE) 0.9 % AERS Place 1 spray into the nose daily at 6 (six) AM.   Semaglutide ,0.25 or 0.5MG /DOS, (OZEMPIC , 0.25 OR 0.5 MG/DOSE,) 2 MG/3ML SOPN Inject 0.5 mg into the skin once a week.   tretinoin  (RETIN-A ) 0.025 % cream Apply topically at bedtime.   Vitamin D , Ergocalciferol , (DRISDOL ) 1.25 MG (50000 UNIT) CAPS capsule Take 1 capsule (50,000 Units total) by mouth every 7 (seven) days.   escitalopram  (LEXAPRO ) 10 MG tablet Take 1 tablet (10 mg total) by mouth daily. Take half tablet for first 2 weeks to start.   No current facility-administered medications on file prior to visit.  There are no discontinued medications.   Physical Exam:    08/09/2023   10:54 AM 06/09/2023   11:54 AM 05/10/2023    1:33 PM  Vitals with BMI  Height 5' 4 5' 4 5' 4  Weight 151 lbs 10 oz 155 lbs 6 oz 158 lbs 6 oz  BMI 26.01 26.66 27.18  Systolic 110 95 120  Diastolic 74 63 64  Pulse 68 86 68  Vital signs reviewed.  Nursing notes reviewed. Weight trend reviewed. Physical Exam General Appearance:  No acute distress appreciable.   Well-groomed, healthy-appearing female.  Well proportioned with no abnormal fat distribution.  Good muscle tone. Pulmonary:  Normal work of breathing at rest, no respiratory distress apparent. SpO2: 98 %  Musculoskeletal: All extremities are intact.  Neurological:  Awake, alert, oriented, and engaged.  No obvious focal neurological deficits or cognitive impairments.  Sensorium seems unclouded.   Speech  is clear and coherent with logical content. Psychiatric:  Appropriate mood, pleasant and cooperative demeanor, thoughtful and engaged during the exam Physical Exam EXTREMITIES: Feet are slightly cold with good pulses. Hair present on feet indicating good blood flow. Excellent monofilament response and pulse. SKIN: Minimal fungal infection on nail. Well-healed surgical scar on abdomen.  Perform Simple Foot Exam  Diabetic Foot Exam - Simple   Simple Foot Form Visual Inspection No deformities, no ulcerations, no other skin breakdown bilaterally: Yes Sensation Testing Intact to touch and monofilament testing bilaterally: Yes Pulse Check Posterior Tibialis and Dorsalis pulse intact bilaterally: Yes Comments Mild onychomycosiss      Results:    06/09/2023   12:06 PM 05/10/2023    1:47 PM 03/17/2023    4:47 PM 11/10/2022    4:23 PM  PHQ 2/9 Scores  PHQ - 2 Score 3 6 5 4   PHQ- 9 Score 18 24 13 11    Results LABS A1c: 11.4 Fasting Blood Glucose: 98 (08/09/2023)    No results found for any visits on 08/09/23. No visits with results within 2 Year(s) from this visit.  Latest known visit with results is:  No results found for any previous visit.  No image results found. No results found.       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 It is not causing significant functional impairment but may benefit from evaluation by a hand specialist. Refer to hand surgery for evaluation. Keloid scar Keloids on her ears are likely related to previous piercings. They are benign but cosmetically concerning. Refer to dermatology for evaluation and potential treatment, as surgical removal is not recommended due to  risk of recurrence. Pruritic condition She experiences pruritus at the site of a previous hysterectomy incision, possibly due to nerve irritation or fungal infection, especially given her diabetes and nail fungus. Prescribe triamcinolone  cream with antifungal properties for itching at the surgical site. Consider referral to dermatology if symptoms persist. Pelvic cramping Pelvic cramping post-hysterectomy is likely due to surgical adhesions. The cramping is not dangerous but uncomfortable. She requests evaluation by a new gynecologist, avoiding previous providers. Refer to a new gynecologist for evaluation. Onychomycosis Terbinafine  and cyclopirox prescribed  Spider veins of limb She has spider veins, which are benign but cosmetically concerning and associated with increased estrogen levels. Discuss sclerotherapy as a treatment option, noting it is expensive, considered cosmetic, and not typically covered by insurance. She is due for routine health maintenance screenings, including mammography, due to a family history of breast  cancer. Despite discrepancies in guidelines, a screening mammogram is warranted given her family history. Order screening mammogram.    ORDER ASSOCIATIONS  #   DIAGNOSIS / CONDITION ICD-10 ENCOUNTER ORDER     ICD-10-CM   1. Type 2 diabetes mellitus with hyperglycemia, without long-term current use of insulin  (HCC)  E11.65 Ambulatory referral to Ophthalmology    Ambulatory referral to Podiatry    CBC with Differential/Platelet    Comprehensive metabolic panel with GFR    Lipid panel    Hemoglobin A1c    Microalbumin / creatinine urine ratio    Continuous Glucose Sensor (FREESTYLE LIBRE 3 PLUS SENSOR) MISC    2. FH: breast cancer  Z80.3 MM DIGITAL SCREENING BILATERAL    3. 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 Keloid scar  L91.0 Ambulatory referral to Dermatology    5. Pruritic condition  L29.9  Ambulatory referral to Dermatology    nystatin -triamcinolone  ointment (MYCOLOG)    6. Pelvic cramping  R10.2 Ambulatory referral to Gynecology    7. Onychomycosis  B35.1 ciclopirox  (LOPROX ) 0.77 % cream    terbinafine  (LAMISIL ) 250 MG tablet    8. Spider veins of limb  I78.1       Meds ordered this encounter  Medications   Continuous Glucose Sensor (FREESTYLE LIBRE 3 PLUS SENSOR) MISC    Sig: Change sensor every 15 days.    Dispense:  1 each    Refill:  5    HGBA1C                   11.4 (H)            05/10/2023                HGBA1C                   7.0 (H)             01/26/2023                HGBA1C                   6.5                 08/11/2022   nystatin -triamcinolone  ointment (MYCOLOG)    Sig: Apply 1 Application topically 2 (two) times daily.    Dispense:  30 g    Refill:  0   ciclopirox  (LOPROX ) 0.77 % cream    Sig: Apply 1 Application topically 2 (two) times daily.    Dispense:  90 g    Refill:  1   terbinafine  (LAMISIL ) 250 MG tablet    Sig: Take 1 tablet (250 mg total) by mouth daily. For toenail fungal infection    Dispense:  84 tablet    Refill:  0         This document was synthesized by artificial intelligence ([employer]bridge) using HIPAA-compliant recording of the clinical interaction;   We discussed the use of AI scribe software for clinical note transcription with the patient, who gave verbal consent to proceed. additional Info: This encounter employed state-of-the-art, real-time, collaborative documentation. The patient actively reviewed and assisted in updating their electronic medical record on a shared screen, ensuring transparency and facilitating joint problem-solving for the problem list, overview, and plan. This approach promotes accurate, informed care. The treatment plan was discussed and reviewed in detail, including medication safety, potential side effects,  and all patient questions. We confirmed understanding and comfort with the plan. Follow-up  instructions were established, including contacting the office for any concerns, returning if symptoms worsen, persist, or new symptoms develop, and precautions for potential emergency department visits.

## 2023-08-09 NOTE — Assessment & Plan Note (Signed)
 Keloids on her ears are likely related to previous piercings. They are benign but cosmetically concerning. Refer to dermatology for evaluation and potential treatment, as surgical removal is not recommended due to risk of recurrence.

## 2023-08-09 NOTE — Assessment & Plan Note (Signed)
 She experiences pruritus at the site of a previous hysterectomy incision, possibly due to nerve irritation or fungal infection, especially given her diabetes and nail fungus. Prescribe triamcinolone  cream with antifungal properties for itching at the surgical site. Consider referral to dermatology if symptoms persist.

## 2023-08-10 ENCOUNTER — Other Ambulatory Visit (HOSPITAL_BASED_OUTPATIENT_CLINIC_OR_DEPARTMENT_OTHER): Payer: Self-pay

## 2023-08-11 ENCOUNTER — Telehealth: Payer: Self-pay

## 2023-08-11 ENCOUNTER — Other Ambulatory Visit: Payer: Self-pay

## 2023-08-11 ENCOUNTER — Other Ambulatory Visit

## 2023-08-11 ENCOUNTER — Other Ambulatory Visit (HOSPITAL_BASED_OUTPATIENT_CLINIC_OR_DEPARTMENT_OTHER): Payer: Self-pay

## 2023-08-11 ENCOUNTER — Other Ambulatory Visit (INDEPENDENT_AMBULATORY_CARE_PROVIDER_SITE_OTHER): Payer: Self-pay

## 2023-08-11 ENCOUNTER — Other Ambulatory Visit: Payer: Self-pay | Admitting: Internal Medicine

## 2023-08-11 ENCOUNTER — Other Ambulatory Visit (HOSPITAL_COMMUNITY): Payer: Self-pay

## 2023-08-11 DIAGNOSIS — E1165 Type 2 diabetes mellitus with hyperglycemia: Secondary | ICD-10-CM

## 2023-08-11 DIAGNOSIS — M20011 Mallet finger of right finger(s): Secondary | ICD-10-CM

## 2023-08-11 DIAGNOSIS — D500 Iron deficiency anemia secondary to blood loss (chronic): Secondary | ICD-10-CM

## 2023-08-11 DIAGNOSIS — L910 Hypertrophic scar: Secondary | ICD-10-CM

## 2023-08-11 LAB — POCT GLYCOSYLATED HEMOGLOBIN (HGB A1C): Hemoglobin A1C: 6.5 % — AB (ref 4.0–5.6)

## 2023-08-11 NOTE — Telephone Encounter (Signed)
 Pharmacy Patient Advocate Encounter   Received notification from Onbase that prior authorization for FREESTYLE LIBRE 3 SENSOR is required/requested.   Insurance verification completed.   The patient is insured through [facility] .   Per test claim: PA required; PA submitted to above mentioned insurance via CoverMyMeds Key/confirmation #/EOC [facility] Status is pending

## 2023-08-11 NOTE — Telephone Encounter (Signed)
 Pt has done pick up new medication spoke with pt in office today

## 2023-08-11 NOTE — Telephone Encounter (Signed)
 Pharmacy Patient Advocate Encounter   Received notification from Onbase that prior authorization for Nystatin -Triamcinolone  100000-0.1UNIT/GM-% ointment is required/requested.   Insurance verification completed.   The patient is insured through [facility] .   Per test claim: Patient must try and fail two of the prefferred drugs below.SABRASABRA

## 2023-08-11 NOTE — Telephone Encounter (Signed)
 Pharmacy Patient Advocate Encounter   Received notification from Onbase that prior authorization for FreeStyle Libre 3 Plus Sensor is required/requested.   Insurance verification completed.   The patient is insured through [facility] .   Per test claim: PA required; PA submitted to above mentioned insurance via CoverMyMeds Key/confirmation #/EOC [facility] Status is pending

## 2023-08-11 NOTE — Telephone Encounter (Signed)
 ERROR

## 2023-08-12 ENCOUNTER — Other Ambulatory Visit (HOSPITAL_COMMUNITY): Payer: Self-pay

## 2023-08-12 ENCOUNTER — Other Ambulatory Visit (HOSPITAL_BASED_OUTPATIENT_CLINIC_OR_DEPARTMENT_OTHER): Payer: Self-pay

## 2023-08-12 ENCOUNTER — Ambulatory Visit: Payer: Self-pay | Admitting: Internal Medicine

## 2023-08-12 MED ORDER — TRIAMCINOLONE ACETONIDE 0.1 % EX CREA
1.0000 | TOPICAL_CREAM | Freq: Two times a day (BID) | CUTANEOUS | 0 refills | Status: AC
  Filled 2023-08-12: qty 30, 15d supply, fill #0

## 2023-08-12 NOTE — Addendum Note (Signed)
 Addended by: Shaine Mount G on: 08/12/2023 03:00 PM   Modules accepted: Orders

## 2023-08-12 NOTE — Telephone Encounter (Signed)
 Pharmacy Patient Advocate Encounter  Received notification from [facility] that Prior Authorization for FreeStyle Libre 3 Plus Sensor has been DENIED.  No reason given; No denial letter received via Fax or CMM. It has been requested and will be uploaded to the media tab once received.   PA #/Case ID/Reference #: [id number]

## 2023-08-13 ENCOUNTER — Other Ambulatory Visit (HOSPITAL_BASED_OUTPATIENT_CLINIC_OR_DEPARTMENT_OTHER): Payer: Self-pay

## 2023-08-16 ENCOUNTER — Encounter: Payer: Self-pay | Admitting: Internal Medicine

## 2023-08-21 ENCOUNTER — Other Ambulatory Visit (HOSPITAL_BASED_OUTPATIENT_CLINIC_OR_DEPARTMENT_OTHER): Payer: Self-pay

## 2023-08-24 ENCOUNTER — Other Ambulatory Visit: Payer: Self-pay

## 2023-08-24 ENCOUNTER — Telehealth: Payer: Self-pay

## 2023-08-24 ENCOUNTER — Ambulatory Visit: Payer: Self-pay | Admitting: Internal Medicine

## 2023-08-24 ENCOUNTER — Other Ambulatory Visit (INDEPENDENT_AMBULATORY_CARE_PROVIDER_SITE_OTHER)

## 2023-08-24 DIAGNOSIS — E559 Vitamin D deficiency, unspecified: Secondary | ICD-10-CM

## 2023-08-24 DIAGNOSIS — D500 Iron deficiency anemia secondary to blood loss (chronic): Secondary | ICD-10-CM | POA: Diagnosis not present

## 2023-08-24 DIAGNOSIS — E1165 Type 2 diabetes mellitus with hyperglycemia: Secondary | ICD-10-CM

## 2023-08-24 LAB — COMPREHENSIVE METABOLIC PANEL WITH GFR
ALT: 8 U/L (ref 0–35)
AST: 15 U/L (ref 0–37)
Albumin: 4.4 g/dL (ref 3.5–5.2)
Alkaline Phosphatase: 40 U/L (ref 39–117)
BUN: 14 mg/dL (ref 6–23)
CO2: 29 meq/L (ref 19–32)
Calcium: 9.2 mg/dL (ref 8.4–10.5)
Chloride: 103 meq/L (ref 96–112)
Creatinine, Ser: 0.69 mg/dL (ref 0.40–1.20)
GFR: 101.6 mL/min (ref >60.00)
Glucose, Bld: 83 mg/dL (ref 70–99)
Potassium: 3.7 meq/L (ref 3.5–5.1)
Sodium: 138 meq/L (ref 135–145)
Total Bilirubin: 1.6 mg/dL — ABNORMAL HIGH (ref 0.2–1.2)
Total Protein: 7.3 g/dL (ref 6.0–8.3)

## 2023-08-24 LAB — CBC WITH DIFFERENTIAL/PLATELET
Basophils Absolute: 0.1 K/uL (ref 0.0–0.1)
Basophils Relative: 2.1 % (ref 0.0–3.0)
Eosinophils Absolute: 0.3 K/uL (ref 0.0–0.7)
Eosinophils Relative: 6.7 % — ABNORMAL HIGH (ref 0.0–5.0)
HCT: 42.1 % (ref 36.0–46.0)
Hemoglobin: 14.2 g/dL (ref 12.0–15.0)
Lymphocytes Relative: 45.9 % (ref 12.0–46.0)
Lymphs Abs: 1.9 K/uL (ref 0.7–4.0)
MCHC: 33.7 g/dL (ref 30.0–36.0)
MCV: 85.8 fl (ref 78.0–100.0)
Monocytes Absolute: 0.3 K/uL (ref 0.1–1.0)
Monocytes Relative: 8 % (ref 3.0–12.0)
Neutro Abs: 1.6 K/uL (ref 1.4–7.7)
Neutrophils Relative %: 37.3 % — ABNORMAL LOW (ref 43.0–77.0)
Platelets: 208 K/uL (ref 150.0–400.0)
RBC: 4.9 Mil/uL (ref 3.87–5.11)
RDW: 13.5 % (ref 11.5–15.5)
WBC: 4.2 K/uL (ref 4.0–10.5)

## 2023-08-24 LAB — LIPID PANEL
Cholesterol: 159 mg/dL (ref 0–200)
HDL: 46.1 mg/dL (ref >39.00)
LDL Cholesterol: 100 mg/dL — ABNORMAL HIGH (ref 0–99)
NonHDL: 112.55
Total CHOL/HDL Ratio: 3
Triglycerides: 62 mg/dL (ref 0.0–149.0)
VLDL: 12.4 mg/dL (ref 0.0–40.0)

## 2023-08-24 LAB — MICROALBUMIN / CREATININE URINE RATIO
Creatinine,U: 142.9 mg/dL
Microalb Creat Ratio: 4.9 mg/g (ref 0.0–30.0)
Microalb, Ur: 0.7 mg/dL (ref 0.0–1.9)

## 2023-08-24 NOTE — Telephone Encounter (Signed)
 Called  [facility] to see where pt labs where at from 2 weeks ago told they have not received any labs since April 2025. Then called [facility] spoke with Luke she stated they have not received anything as well.

## 2023-08-24 NOTE — Progress Notes (Signed)
 Labs reviewed nothing was concerning

## 2023-08-25 ENCOUNTER — Other Ambulatory Visit (HOSPITAL_BASED_OUTPATIENT_CLINIC_OR_DEPARTMENT_OTHER): Payer: Self-pay

## 2023-08-27 ENCOUNTER — Other Ambulatory Visit (HOSPITAL_BASED_OUTPATIENT_CLINIC_OR_DEPARTMENT_OTHER): Payer: Self-pay

## 2023-09-01 ENCOUNTER — Other Ambulatory Visit (HOSPITAL_COMMUNITY): Payer: Self-pay

## 2023-09-01 ENCOUNTER — Encounter: Payer: Self-pay | Admitting: Family

## 2023-09-01 ENCOUNTER — Other Ambulatory Visit: Payer: Self-pay

## 2023-09-01 ENCOUNTER — Other Ambulatory Visit (HOSPITAL_BASED_OUTPATIENT_CLINIC_OR_DEPARTMENT_OTHER): Payer: Self-pay

## 2023-09-01 ENCOUNTER — Other Ambulatory Visit: Payer: Self-pay | Admitting: Family Medicine

## 2023-09-01 DIAGNOSIS — R41840 Attention and concentration deficit: Secondary | ICD-10-CM

## 2023-09-02 ENCOUNTER — Ambulatory Visit (INDEPENDENT_AMBULATORY_CARE_PROVIDER_SITE_OTHER): Admitting: Internal Medicine

## 2023-09-02 ENCOUNTER — Encounter: Payer: Self-pay | Admitting: Internal Medicine

## 2023-09-02 ENCOUNTER — Other Ambulatory Visit (HOSPITAL_BASED_OUTPATIENT_CLINIC_OR_DEPARTMENT_OTHER): Payer: Self-pay

## 2023-09-02 ENCOUNTER — Other Ambulatory Visit: Payer: Self-pay

## 2023-09-02 VITALS — BP 110/72 | HR 78 | Resp 18 | Ht 64.0 in

## 2023-09-02 DIAGNOSIS — H538 Other visual disturbances: Secondary | ICD-10-CM | POA: Diagnosis not present

## 2023-09-02 DIAGNOSIS — R41840 Attention and concentration deficit: Secondary | ICD-10-CM

## 2023-09-02 DIAGNOSIS — T7840XD Allergy, unspecified, subsequent encounter: Secondary | ICD-10-CM | POA: Diagnosis not present

## 2023-09-02 MED ORDER — AMPHETAMINE-DEXTROAMPHET ER 5 MG PO CP24
5.0000 mg | ORAL_CAPSULE | Freq: Every day | ORAL | 0 refills | Status: DC
  Filled 2023-09-02: qty 30, 30d supply, fill #0

## 2023-09-02 MED ORDER — AMPHETAMINE-DEXTROAMPHET ER 5 MG PO CP24: 5.0000 mg | ORAL_CAPSULE | Freq: Every day | ORAL | 0 refills | Status: DC

## 2023-09-02 MED ORDER — LEVOCETIRIZINE DIHYDROCHLORIDE 5 MG PO TABS
5.0000 mg | ORAL_TABLET | Freq: Every evening | ORAL | 3 refills | Status: AC
  Filled 2023-09-02: qty 30, 30d supply, fill #0
  Filled 2023-12-02: qty 30, 30d supply, fill #1
  Filled 2024-02-17: qty 30, 30d supply, fill #2

## 2023-09-02 NOTE — Patient Instructions (Addendum)
 It was a pleasure seeing you today! Your health and satisfaction are our top priorities.  Bernardino Cone, MD     Your Providers PCP: Cone Bernardino MATSU, MD,  [phone]) Referring Provider: Cone Bernardino MATSU, MD,  [phone])  NEXT STEPS: [x]  Early Intervention: Schedule sooner appointment, call our on-call services, or go to emergency room if there is any significant Increase in pain or discomfort New or worsening symptoms Sudden or severe changes in your health [x]  Flexible Follow-Up: We recommend a Return in about 1 month (around 10/03/2023). for optimal routine care. This allows for progress monitoring and treatment adjustments. [x]  Preventive Care: Schedule your annual preventive care visit! It's typically covered by insurance and helps identify potential health issues early. [x]  Lab & X-ray Appointments: Incomplete tests scheduled today, or call to schedule. X-rays: [facility] (M-F, 8:30am-noon or 1pm-5pm). [x]  Medical Information Release: Sign a release form at front desk to obtain relevant medical information we don't have.  MAKING THE MOST OF OUR FOCUSED 20 MINUTE APPOINTMENTS: [x]   Clearly state your top concerns at the beginning of the visit to focus our discussion [x]   If you anticipate you will need more time, please inform the front desk during scheduling - we can book multiple appointments in the same week. [x]   If you have transportation problems- use our convenient video appointments or ask about transportation support. [x]   We can get down to business faster if you use MyChart to update information before the visit and submit non-urgent questions before your visit. Thank you for taking the time to provide details through MyChart.  Let our nurse know and she can import this information into your encounter documents.  Arrival and Wait Times: [x]   Arriving on time ensures that everyone receives prompt attention. [x]   Early morning (8a) and afternoon (1p)  appointments tend to have shortest wait times. [x]   Unfortunately, we cannot delay appointments for late arrivals or hold slots during phone calls.  Getting Answers and Following Up [x]   Simple Questions & Concerns: For quick questions or basic follow-up after your visit, reach us  at [phone] or MyChart messaging. [x]   Complex Concerns: If your concern is more complex, scheduling an appointment might be best. Discuss this with the staff to find the most suitable option. [x]   Lab & Imaging Results: We'll contact you directly if results are abnormal or you don't use MyChart. Most normal results will be on MyChart within 2-3 business days, with a review message from Dr. Cone. Haven't heard back in 2 weeks? Need results sooner? Contact us  at [phone]. [x]   Referrals: Our referral coordinator will manage specialist referrals. The specialist's office should contact you within 2 weeks to schedule an appointment. Call us  if you haven't heard from them after 2 weeks.  Staying Connected [x]   MyChart: Activate your MyChart for the fastest way to access results and message us . See the last page of this paperwork for instructions on how to activate.  Bring to Your Next Appointment [x]   Medications: Please bring all your medication bottles to your next appointment to ensure we have an accurate record of your prescriptions. [x]   Health Diaries: If you're monitoring any health conditions at home, keeping a diary of your readings can be very helpful for discussions at your next appointment.  Billing [x]   X-ray & Lab Orders: These are billed by separate companies. Contact the invoicing company directly for questions or concerns. [x]   Visit Charges: Discuss any billing inquiries with  our administrative services team.  Your Satisfaction Matters [x]   Share Your Experience: We strive for your satisfaction! If you have any complaints, or preferably compliments, please let Dr. Jesus know directly or  contact our Practice Administrators, Manuelita Rubin or [employer], by asking at the front desk.   Reviewing Your Records [x]   Review this early draft of your clinical encounter notes below and the final encounter summary tomorrow on MyChart after its been completed.  All orders placed so far are visible here: Attention or concentration deficit -     Amphetamine -Dextroamphet ER; Take 1 capsule (5 mg total) by mouth daily.  Dispense: 30 capsule; Refill: 0 -     Amphetamine -Dextroamphet ER; Take 1 capsule (5 mg total) by mouth daily.  Dispense: 30 capsule; Refill: 0 -     Amphetamine -Dextroamphet ER; Take 1 capsule (5 mg total) by mouth daily.  Dispense: 30 capsule; Refill: 0  Blurry vision -     Ambulatory referral to Ophthalmology  Allergy, subsequent encounter -     Levocetirizine Dihydrochloride ; Take 1 tablet (5 mg total) by mouth every evening.  Dispense: 90 tablet; Refill: 3         ALLERGY MANAGEMENT PLAN  This plan is designed to help manage your allergic rhinitis (nasal allergies) effectively. Follow these steps daily for best results.  Sinus saline sprays- use nightly, and after sneezing episodes or exposure to allergen.  Insert deeply and spray mist into nose while leaning over sink at 45 degrees,  while gently breathing. Also blow out onto tissue while leaning forward 45 degrees. Once daily, after a sinus rinse, use sensimist.  Just before bedtime is best. This only needed if allergies acting up.  If this is inadequate add-on once daily for levocetirizine / xyzal  5 mg for nondrowsy antihistamine Take benadryl  25 mg at bedtime also if allergic mucus is persisting  When allergies cause chronic swelling in sinuses, it leads to sinus infections:    DAILY TREATMENT ROUTINE   Time of Day Treatment Steps  Morning 1. Saline Nasal Spray - Use to cleanse nasal passages 2. Xyzal  (levocetirizine) - Take one tablet daily   Throughout Day Saline Nasal Spray - Use 2 additional  times (mid-day and afternoon)   Evening/Bedtime 1. Saline Nasal Rinse - Thoroughly clean nasal passages 2. Flonase  Sensimist - Apply after nasal rinse 3. Benadryl  (diphenhydramine ) - Take 25mg  if experiencing persistent congestion    PROPER TECHNIQUE GUIDE       Saline Nasal Spray/Rinse Technique: Lean forward over sink at a 45-degree angle Turn head slightly to one side Insert spray tip into upper nostril Spray gently while breathing lightly through your nose Repeat on other side Gently blow nose to clear excess solution Use saline spray 3 times daily to keep nasal passages moist and clear allergens.       Flonase  Sensimist Technique: Shake bottle gently before each use Prime the bottle if it's new or hasn't been used for a week Tilt your head forward slightly Insert tip into nostril, pointing away from the center of your nose Spray while inhaling gently Repeat in other nostril Use Flonase  Sensimist once daily, preferably at bedtime after using saline rinse. It may take several days of regular use to feel maximum benefit.   WHY FLONASE  SENSIMIST?   Benefits of Flonase  Sensimist:  Alcohol-free and scent-free formula - gentler on sensitive nasal passages Fine mist application - more comfortable with less dripping down throat Effectively relieves nasal congestion, sneezing, runny nose, and  even eye symptoms 24-hour relief with once-daily dosing Uses a more potent form of fluticasone  that works at a lower dose Less liquid per spray means less discomfort  UNDERSTANDING YOUR MEDICATIONS   Medication How It Works Important Notes  Flonase  Sensimist (fluticasone  furoate) Reduces inflammation in nasal passages, addressing the underlying cause of allergy symptoms - Takes several days for full effect - Use daily for best results - Safe for long-term use   Xyzal  (levocetirizine) Blocks histamine to reduce allergy symptoms like sneezing and itching - Take at the same time each day -  May cause drowsiness in some people - Once-daily dosing   Benadryl  (diphenhydramine ) Antihistamine that provides additional relief for breakthrough symptoms - Causes drowsiness - Use only at bedtime - For occasional use when needed   Saline Spray/Rinse Physically removes allergens and moistens nasal passages - Safe to use frequently - Improves effectiveness of other treatments - Reduces nasal irritation    CONTACT YOUR PROVIDER IF: Your symptoms do not improve after 1-2 weeks of following this plan You develop sinus pain with fever or green/yellow discharge You experience frequent nosebleeds You develop new or worsening symptoms You have questions about your treatment plan     ADDITIONAL ALLERGY MANAGEMENT TIPS   HELPFUL STRATEGIES: ?? Keep windows closed during high pollen seasons ??? Use allergen-proof covers for pillows and mattresses ?? Vacuum regularly with a HEPA filter vacuum ?? Shower and change clothes after spending time outdoors ?? Check local pollen counts and limit outdoor time when counts are high ?? Stay well-hydrated to help keep mucous membranes moist

## 2023-09-02 NOTE — Progress Notes (Signed)
 ==============================  [facility]: [phone]   -- Medical Office Visit --  Patient: [name]      Age: 48 y.o.       Sex:  female  Date:   09/02/2023 Today's Healthcare Provider: Bernardino KANDICE Cone, MD  ==============================   Chief Complaint: Medication Refills (Need medication refilled) Resume Attention Deficit Hyperactivity Disorder (ADHD) management   Discussed the use of AI scribe software for clinical note transcription with the patient, who gave verbal consent to proceed.  History of Present Illness The patient presents with a request to restart Adderall for concentration issues.  She previously took Adderall at a low dose of 5 mg but did not notice significant effects. She did not feel 'speedy' on the low dose and is contemplating whether to try a higher dose or restart at the same level.  She has not tried Strattera, a non-controlled alternative for concentration.  She does not recall having any old records of an ADHD diagnosis from previous providers, but Dr. Wendolyn trialed 5 mg Adderall and she'd like to resume it.  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 Past Medical History:  has a past medical history of Anemia, Asthma, Blood transfusion without reported  diagnosis, Gestational diabetes, Postoperative anemia due to acute blood loss (10/09/2021), and Type 2 diabetes mellitus (HCC). Past Surgical History:   has a past surgical history that includes arm surgery; Dilation and curettage of uterus; Cesarean section; Tooth extraction; Cervical cerclage; IR Radiologist Eval & Mgmt (09/04/2021); and Hysterectomy abdominal with salpingectomy (Bilateral, 10/08/2021). Social History:   reports that she has never smoked. She has never used smokeless tobacco. She reports that she does not drink alcohol and does not use drugs. Family History:  family history includes Breast cancer in her maternal aunt; Hypertension in her brother, maternal uncle, and mother. Allergies:  is allergic to shellfish allergy, iodinated contrast media, other, and latex.   Medication Reconciliation: Current Outpatient Medications on File Prior to Visit  Medication Sig   Accu-Chek Softclix Lancets lancets Use as instructed   albuterol  (VENTOLIN  HFA) 108 (90 Base) MCG/ACT inhaler Inhale 2 puffs into the lungs every 6 (six) hours as needed for wheezing or shortness of breath.   Blood Glucose Monitoring Suppl (ACCU-CHEK GUIDE) w/Device KIT Use to test blood sugar daily   Calcium Carb-Cholecalciferol  (CALCIUM + D3 PO) Take 1 tablet by mouth daily. When able to remember   Cholecalciferol  (VITAMIN D3) 125 MCG (5000 UT) CAPS Take 1 capsule (5,000 Units total) by mouth daily.   ciclopirox  (LOPROX ) 0.77 % cream Apply 1 Application topically 2 (two) times daily.   clindamycin  (CLINDAGEL ) 1 % gel Apply topically 2 (two) times daily.   Continuous Glucose Sensor (FREESTYLE LIBRE 3 PLUS SENSOR) MISC Change sensor every 15 days.   Cyanocobalamin  (B-12) 1000 MCG TBCR Take 1,000 mcg by mouth daily. When able to remember   dapagliflozin  propanediol (FARXIGA ) 5 MG TABS tablet Take 1 tablet (5 mg total) by mouth daily  before breakfast.   escitalopram  (LEXAPRO ) 10 MG tablet Take 1 tablet (10 mg total) by mouth  daily. Take half tablet for first 2 weeks to start.   fluticasone  (FLONASE ) 50 MCG/ACT nasal spray Place 1 spray into both nostrils in the morning and at bedtime.   glucose blood (ACCU-CHEK GUIDE TEST) test strip Use to test blood sugar daily   glucose blood (ACCU-CHEK GUIDE) test strip daily   glucose blood test strip Use as instructed   Lancets (FREESTYLE) lancets Use as instructed   nystatin -triamcinolone  ointment (MYCOLOG) Apply 1 Application topically 2 (two) times daily.   ondansetron  (ZOFRAN -ODT) 4 MG disintegrating tablet Dissolve 1 tablet under the tongue every 8 (eight) hours as needed for nausea or vomiting.   ondansetron  (ZOFRAN -ODT) 4 MG disintegrating tablet Dissolve 1 tablet under the tongue every 6 (six) hours as needed for nausea.   Saline (SIMPLY SALINE) 0.9 % AERS Place 1 spray into the nose daily at 6 (six) AM.   Semaglutide ,0.25 or 0.5MG /DOS, (OZEMPIC , 0.25 OR 0.5 MG/DOSE,) 2 MG/3ML SOPN Inject 0.5 mg into the skin once a week.   terbinafine  (LAMISIL ) 250 MG tablet Take 1 tablet (250 mg total) by mouth daily. For toenail fungal infection   tretinoin  (RETIN-A ) 0.025 % cream Apply topically at bedtime.   triamcinolone  cream (KENALOG ) 0.1 % Apply 1 Application topically 2 (two) times daily.   Vitamin D , Ergocalciferol , (DRISDOL ) 1.25 MG (50000 UNIT) CAPS capsule Take 1 capsule (50,000 Units total) by mouth every 7 (seven) days.   No current facility-administered medications on file prior to visit.   Medications Discontinued During This Encounter  Medication Reason   amphetamine -dextroamphetamine  (ADDERALL XR) 5 MG 24 hr capsule Reorder   cetirizine  (ZYRTEC  ALLERGY) 10 MG tablet Completed Course     Physical Exam:    09/02/2023   12:45 PM 08/09/2023   10:54 AM 06/09/2023   11:54 AM  Vitals with BMI  Height 5' 4 5' 4 5' 4  Weight  151 lbs 10 oz 155 lbs 6 oz  BMI  26.01 26.66  Systolic 110 110 95  Diastolic 72 74 63  Pulse 78 68 86  Vital signs reviewed.  Nursing  notes reviewed. Weight trend reviewed. Physical Exam General Appearance:  No acute distress appreciable.   Well-groomed, healthy-appearing female.  Well proportioned with no abnormal fat distribution.  Good muscle tone. Pulmonary:  Normal work of breathing at rest, no respiratory distress apparent. SpO2: 100 %  Musculoskeletal: All extremities are intact.  Neurological:  Awake, alert, oriented, and engaged.  No obvious focal neurological deficits or cognitive impairments.  Sensorium seems unclouded.   Speech is clear and coherent with logical content. Psychiatric:  Appropriate mood, pleasant and cooperative demeanor, thoughtful and engaged during the exam     06/09/2023   12:06 PM 05/10/2023    1:47 PM 03/17/2023    4:47 PM 11/10/2022    4:23 PM  PHQ 2/9 Scores  PHQ - 2 Score 3 6 5 4   PHQ- 9 Score 18 24 13 11    Lab on 08/24/2023  Component Date Value Ref Range Status   Cholesterol 08/24/2023 159  0 - 200 mg/dL Final   Triglycerides 92/77/7974 62.0  0.0 - 149.0 mg/dL Final   HDL 92/77/7974 46.10  >39.00 mg/dL Final   VLDL 92/77/7974 12.4  0.0 - 40.0 mg/dL Final   LDL Cholesterol 08/24/2023 100 (H)  0 - 99 mg/dL Final   Total CHOL/HDL Ratio 08/24/2023 3   Final   NonHDL  08/24/2023 112.55   Final   Sodium 08/24/2023 138  135 - 145 mEq/L Final   Potassium 08/24/2023 3.7  3.5 - 5.1 mEq/L Final   Chloride 08/24/2023 103  96 - 112 mEq/L Final   CO2 08/24/2023 29  19 - 32 mEq/L Final   Glucose, Bld 08/24/2023 83  70 - 99 mg/dL Final   BUN 92/77/7974 14  6 - 23 mg/dL Final   Creatinine, Ser 08/24/2023 0.69  0.40 - 1.20 mg/dL Final   Total Bilirubin 08/24/2023 1.6 (H)  0.2 - 1.2 mg/dL Final   Alkaline Phosphatase 08/24/2023 40  39 - 117 U/L Final   AST 08/24/2023 15  0 - 37 U/L Final   ALT 08/24/2023 8  0 - 35 U/L Final   Total Protein 08/24/2023 7.3  6.0 - 8.3 g/dL Final   Albumin  08/24/2023 4.4  3.5 - 5.2 g/dL Final   GFR 92/77/7974 101.60  >60.00 mL/min Final   Calcium 08/24/2023 9.2  8.4  - 10.5 mg/dL Final   Microalb, Ur 92/77/7974 0.7  0.0 - 1.9 mg/dL Final   Creatinine,U 92/77/7974 142.9  mg/dL Final   Microalb Creat Ratio 08/24/2023 4.9  0.0 - 30.0 mg/g Final   WBC 08/24/2023 4.2  4.0 - 10.5 K/uL Final   RBC 08/24/2023 4.90  3.87 - 5.11 Mil/uL Final   Hemoglobin 08/24/2023 14.2  12.0 - 15.0 g/dL Final   HCT 92/77/7974 42.1  36.0 - 46.0 % Final   MCV 08/24/2023 85.8  78.0 - 100.0 fl Final   MCHC 08/24/2023 33.7  30.0 - 36.0 g/dL Final   RDW 92/77/7974 13.5  11.5 - 15.5 % Final   Platelets 08/24/2023 208.0  150.0 - 400.0 K/uL Final   Neutrophils Relative % 08/24/2023 37.3 (L)  43.0 - 77.0 % Final   Lymphocytes Relative 08/24/2023 45.9  12.0 - 46.0 % Final   Monocytes Relative 08/24/2023 8.0  3.0 - 12.0 % Final   Eosinophils Relative 08/24/2023 6.7 (H)  0.0 - 5.0 % Final   Basophils Relative 08/24/2023 2.1  0.0 - 3.0 % Final   Neutro Abs 08/24/2023 1.6  1.4 - 7.7 K/uL Final   Lymphs Abs 08/24/2023 1.9  0.7 - 4.0 K/uL Final   Monocytes Absolute 08/24/2023 0.3  0.1 - 1.0 K/uL Final   Eosinophils Absolute 08/24/2023 0.3  0.0 - 0.7 K/uL Final   Basophils Absolute 08/24/2023 0.1  0.0 - 0.1 K/uL Final  Orders Only on 08/11/2023  Component Date Value Ref Range Status   Hemoglobin A1C 08/11/2023 6.5 (A)  4.0 - 5.6 % Final  Office Visit on 06/09/2023  Component Date Value Ref Range Status   Color, UA 06/09/2023 yellow   Final   Clarity, UA 06/09/2023 clear   Final   Glucose, UA 06/09/2023 Positive (A)  Negative Final   Bilirubin, UA 06/09/2023 negative   Final   Ketones, UA 06/09/2023 +-5   Final   Spec Grav, UA 06/09/2023 1.025  1.010 - 1.025 Final   Blood, UA 06/09/2023 Negative   Final   pH, UA 06/09/2023 5.0  5.0 - 8.0 Final   Protein, UA 06/09/2023 Negative  Negative Final   Urobilinogen, UA 06/09/2023 0.2  0.2 or 1.0 E.U./dL Final   Nitrite, UA 94/92/7974 negative   Final   Leukocytes, UA 06/09/2023 Negative  Negative Final   Odor 06/09/2023 negative   Final   Office Visit on 05/10/2023  Component Date Value Ref Range Status   WBC 05/10/2023 4.4  4.0 - 10.5 K/uL Final   RBC 05/10/2023 5.12 (H)  3.87 - 5.11 Mil/uL Final   Hemoglobin 05/10/2023 15.1 (H)  12.0 - 15.0 g/dL Final   HCT 95/92/7974 44.2  36.0 - 46.0 % Final   MCV 05/10/2023 86.3  78.0 - 100.0 fl Final   MCHC 05/10/2023 34.2  30.0 - 36.0 g/dL Final   RDW 95/92/7974 13.6  11.5 - 15.5 % Final   Platelets 05/10/2023 205.0  150.0 - 400.0 K/uL Final   Neutrophils Relative % 05/10/2023 38.6 (L)  43.0 - 77.0 % Final   Lymphocytes Relative 05/10/2023 44.6  12.0 - 46.0 % Final   Monocytes Relative 05/10/2023 7.3  3.0 - 12.0 % Final   Eosinophils Relative 05/10/2023 8.0 (H)  0.0 - 5.0 % Final   Basophils Relative 05/10/2023 1.5  0.0 - 3.0 % Final   Neutro Abs 05/10/2023 1.7  1.4 - 7.7 K/uL Final   Lymphs Abs 05/10/2023 2.0  0.7 - 4.0 K/uL Final   Monocytes Absolute 05/10/2023 0.3  0.1 - 1.0 K/uL Final   Eosinophils Absolute 05/10/2023 0.3  0.0 - 0.7 K/uL Final   Basophils Absolute 05/10/2023 0.1  0.0 - 0.1 K/uL Final   Sodium 05/10/2023 138  135 - 145 mEq/L Final   Potassium 05/10/2023 4.3  3.5 - 5.1 mEq/L Final   Chloride 05/10/2023 100  96 - 112 mEq/L Final   CO2 05/10/2023 29  19 - 32 mEq/L Final   Glucose, Bld 05/10/2023 347 (H)  70 - 99 mg/dL Final   BUN 95/92/7974 15  6 - 23 mg/dL Final   Creatinine, Ser 05/10/2023 0.77  0.40 - 1.20 mg/dL Final   Total Bilirubin 05/10/2023 1.3 (H)  0.2 - 1.2 mg/dL Final   Alkaline Phosphatase 05/10/2023 61  39 - 117 U/L Final   AST 05/10/2023 12  0 - 37 U/L Final   ALT 05/10/2023 11  0 - 35 U/L Final   Total Protein 05/10/2023 7.5  6.0 - 8.3 g/dL Final   Albumin  05/10/2023 4.5  3.5 - 5.2 g/dL Final   GFR 95/92/7974 90.49  >60.00 mL/min Final   Calcium 05/10/2023 9.7  8.4 - 10.5 mg/dL Final   Cholesterol 95/92/7974 157  0 - 200 mg/dL Final   Triglycerides 95/92/7974 85.0  0.0 - 149.0 mg/dL Final   HDL 95/92/7974 47.60  >39.00 mg/dL Final   VLDL  95/92/7974 17.0  0.0 - 40.0 mg/dL Final   LDL Cholesterol 05/10/2023 93  0 - 99 mg/dL Final   Total CHOL/HDL Ratio 05/10/2023 3   Final   NonHDL 05/10/2023 109.51   Final   Hgb A1c MFr Bld 05/10/2023 11.4 (H)  4.6 - 6.5 % Final   VITD 05/10/2023 29.53 (L)  30.00 - 100.00 ng/mL Final   Vitamin B-12 05/10/2023 >1537 (H)  211 - 911 pg/mL Final   Folate 05/10/2023 >25.2  >5.9 ng/mL Final   Microalb, Ur 05/10/2023 1.0  0.0 - 1.9 mg/dL Final   Creatinine,U 95/92/7974 111.8  mg/dL Final   Microalb Creat Ratio 05/10/2023 9.1  0.0 - 30.0 mg/g Final   Color, Urine 05/10/2023 YELLOW  YELLOW Final   APPearance 05/10/2023 CLEAR  CLEAR Final   Specific Gravity, Urine 05/10/2023 1.039 (H)  1.001 - 1.035 Final   pH 05/10/2023 < OR = 5.0  5.0 - 8.0 Final   Glucose, UA 05/10/2023 3+ (A)  NEGATIVE Final   Bilirubin Urine 05/10/2023 NEGATIVE  NEGATIVE Final   Ketones, ur 05/10/2023 3+ (  A)  NEGATIVE Final   Hgb urine dipstick 05/10/2023 NEGATIVE  NEGATIVE Final   Protein, ur 05/10/2023 NEGATIVE  NEGATIVE Final   Nitrites, Initial 05/10/2023 NEGATIVE  NEGATIVE Final   Leukocyte Esterase 05/10/2023 NEGATIVE  NEGATIVE Final   WBC, UA 05/10/2023 0-5  0 - 5 /HPF Final   RBC / HPF 05/10/2023 NONE SEEN  0 - 2 /HPF Final   Squamous Epithelial / HPF 05/10/2023 0-5  < OR = 5 /HPF Final   Bacteria, UA 05/10/2023 NONE SEEN  NONE SEEN /HPF Final   Hyaline Cast 05/10/2023 NONE SEEN  NONE SEEN /LPF Final   Note 05/10/2023    Final   Reflexve Urine Culture 05/10/2023    Final  Office Visit on 03/17/2023  Component Date Value Ref Range Status   Microalb, Ur 03/17/2023 <0.7  0.0 - 1.9 mg/dL Final   Creatinine,U 97/87/7974 97.5  mg/dL Final   Microalb Creat Ratio 03/17/2023 7.2  0.0 - 30.0 mg/g Final  Office Visit on 02/01/2023  Component Date Value Ref Range Status   SARS Coronavirus 2 Ag 02/01/2023 Negative  Negative Final   Rapid Strep A Screen 02/01/2023 Positive (A)  Negative Final   Influenza A, POC 02/01/2023  Positive (A)  Negative Final   Influenza B, POC 02/01/2023 Negative  Negative Final  Office Visit on 01/26/2023  Component Date Value Ref Range Status   WBC 01/26/2023 3.7 (L)  4.0 - 10.5 K/uL Final   RBC 01/26/2023 4.59  3.87 - 5.11 Mil/uL Final   Hemoglobin 01/26/2023 13.6  12.0 - 15.0 g/dL Final   HCT 87/75/7975 40.0  36.0 - 46.0 % Final   MCV 01/26/2023 87.3  78.0 - 100.0 fl Final   MCHC 01/26/2023 34.0  30.0 - 36.0 g/dL Final   RDW 87/75/7975 13.5  11.5 - 15.5 % Final   Platelets 01/26/2023 186.0  150.0 - 400.0 K/uL Final   Neutrophils Relative % 01/26/2023 44.6  43.0 - 77.0 % Final   Lymphocytes Relative 01/26/2023 41.9  12.0 - 46.0 % Final   Monocytes Relative 01/26/2023 7.6  3.0 - 12.0 % Final   Eosinophils Relative 01/26/2023 4.5  0.0 - 5.0 % Final   Basophils Relative 01/26/2023 1.4  0.0 - 3.0 % Final   Neutro Abs 01/26/2023 1.7  1.4 - 7.7 K/uL Final   Lymphs Abs 01/26/2023 1.6  0.7 - 4.0 K/uL Final   Monocytes Absolute 01/26/2023 0.3  0.1 - 1.0 K/uL Final   Eosinophils Absolute 01/26/2023 0.2  0.0 - 0.7 K/uL Final   Basophils Absolute 01/26/2023 0.1  0.0 - 0.1 K/uL Final   Sodium 01/26/2023 139  135 - 145 mEq/L Final   Potassium 01/26/2023 3.6  3.5 - 5.1 mEq/L Final   Chloride 01/26/2023 104  96 - 112 mEq/L Final   CO2 01/26/2023 29  19 - 32 mEq/L Final   Glucose, Bld 01/26/2023 123 (H)  70 - 99 mg/dL Final   BUN 87/75/7975 14  6 - 23 mg/dL Final   Creatinine, Ser 01/26/2023 0.62  0.40 - 1.20 mg/dL Final   Total Bilirubin 01/26/2023 1.2  0.2 - 1.2 mg/dL Final   Alkaline Phosphatase 01/26/2023 46  39 - 117 U/L Final   AST 01/26/2023 16  0 - 37 U/L Final   ALT 01/26/2023 10  0 - 35 U/L Final   Total Protein 01/26/2023 6.8  6.0 - 8.3 g/dL Final   Albumin  01/26/2023 4.1  3.5 - 5.2 g/dL Final   GFR  01/26/2023 104.68  >60.00 mL/min Final   Calcium 01/26/2023 8.9  8.4 - 10.5 mg/dL Final   Hgb J8r MFr Bld 01/26/2023 7.0 (H)  4.6 - 6.5 % Final   Cholesterol 01/26/2023 157  0 -  200 mg/dL Final   Triglycerides 87/75/7975 70.0  0.0 - 149.0 mg/dL Final   HDL 87/75/7975 44.90  >39.00 mg/dL Final   VLDL 87/75/7975 14.0  0.0 - 40.0 mg/dL Final   LDL Cholesterol 01/26/2023 98  0 - 99 mg/dL Final   Total CHOL/HDL Ratio 01/26/2023 3   Final   NonHDL 01/26/2023 111.77   Final   Amphetamines, Urine 01/26/2023 Negative  Cutoff=1000 ng/mL Final   Barbiturate Quant, Ur 01/26/2023 Negative  Cutoff=300 ng/mL Final   Benzodiazepine Quant, Ur 01/26/2023 Negative  Cutoff=300 ng/mL Final   Cannabinoid Quant, Ur 01/26/2023 Negative  Cutoff=50 ng/mL Final   Cocaine (Metab.) 01/26/2023 Negative  Cutoff=300 ng/mL Final   Opiate Quant, Ur 01/26/2023 Negative  Cutoff=300 ng/mL Final   PCP Quant, Ur 01/26/2023 Negative  Cutoff=25 ng/mL Final   Methadone Screen, Urine 01/26/2023 Negative  Cutoff=300 ng/mL Final   Propoxyphene 01/26/2023 Negative  Cutoff=300 ng/mL Final   Ethanol, Urine 01/26/2023 Negative  Cutoff=0.020 % Final  Lab on 11/11/2022  Component Date Value Ref Range Status   QuantiFERON-TB Gold Plus 11/11/2022 NEGATIVE  NEGATIVE Final   NIL 11/11/2022 0.03  IU/mL Final   Mitogen-NIL 11/11/2022 >10.00  IU/mL Final   TB1-NIL 11/11/2022 0.01  IU/mL Final   TB2-NIL 11/11/2022 0.00  IU/mL Final  Office Visit on 08/11/2022  Component Date Value Ref Range Status   WBC 08/11/2022 3.7 (L)  4.0 - 10.5 K/uL Final   RBC 08/11/2022 4.55  3.87 - 5.11 Mil/uL Final   Hemoglobin 08/11/2022 13.3  12.0 - 15.0 g/dL Final   HCT 92/90/7975 39.7  36.0 - 46.0 % Final   MCV 08/11/2022 87.4  78.0 - 100.0 fl Final   MCHC 08/11/2022 33.4  30.0 - 36.0 g/dL Final   RDW 92/90/7975 13.9  11.5 - 15.5 % Final   Platelets 08/11/2022 203.0  150.0 - 400.0 K/uL Final   Neutrophils Relative % 08/11/2022 42.2 (L)  43.0 - 77.0 % Final   Lymphocytes Relative 08/11/2022 45.7  12.0 - 46.0 % Final   Monocytes Relative 08/11/2022 7.6  3.0 - 12.0 % Final   Eosinophils Relative 08/11/2022 3.1  0.0 - 5.0 % Final    Basophils Relative 08/11/2022 1.4  0.0 - 3.0 % Final   Neutro Abs 08/11/2022 1.5  1.4 - 7.7 K/uL Final   Lymphs Abs 08/11/2022 1.7  0.7 - 4.0 K/uL Final   Monocytes Absolute 08/11/2022 0.3  0.1 - 1.0 K/uL Final   Eosinophils Absolute 08/11/2022 0.1  0.0 - 0.7 K/uL Final   Basophils Absolute 08/11/2022 0.1  0.0 - 0.1 K/uL Final   Sodium 08/11/2022 140  135 - 145 mEq/L Final   Potassium 08/11/2022 3.9  3.5 - 5.1 mEq/L Final   Chloride 08/11/2022 103  96 - 112 mEq/L Final   CO2 08/11/2022 31  19 - 32 mEq/L Final   Glucose, Bld 08/11/2022 90  70 - 99 mg/dL Final   BUN 92/90/7975 9  6 - 23 mg/dL Final   Creatinine, Ser 08/11/2022 0.62  0.40 - 1.20 mg/dL Final   Total Bilirubin 08/11/2022 1.5 (H)  0.2 - 1.2 mg/dL Final   Alkaline Phosphatase 08/11/2022 49  39 - 117 U/L Final   AST 08/11/2022 15  0 - 37  U/L Final   ALT 08/11/2022 11  0 - 35 U/L Final   Total Protein 08/11/2022 6.6  6.0 - 8.3 g/dL Final   Albumin  08/11/2022 4.1  3.5 - 5.2 g/dL Final   GFR 92/90/7975 105.02  >60.00 mL/min Final   Calcium 08/11/2022 9.6  8.4 - 10.5 mg/dL Final   Hgb J8r MFr Bld 08/11/2022 6.5  4.6 - 6.5 % Final  Scanned Document on 05/19/2022  Component Date Value Ref Range Status   HM Diabetic Eye Exam 12/18/2021 No Retinopathy  No Retinopathy Final  There may be more visits with results that are not included.  No image results found. No results found.       ASSESSMENT & PLAN   Assessment & Plan Attention or concentration deficit ADHD was previously diagnosed by Dr. Wendolyn. She has not experienced significant effects from prior low-dose Adderall (5 mg). Discussed potential risks of stimulant medications, including high potential for abuse, misuse, dependence, sudden death, myocardial infarction, and psychosis. Withdrawal symptoms such as dysphoria or anhedonia were explained. Alternative non-controlled medication, Strattera, was discussed, which has less risk but also less efficacy. She prefers to start with  a low dose of Adderall and adjust as needed. Emphasized the importance of secure storage to prevent theft due to the high potential for misuse and the ongoing methamphetamine crisis. Prescribe Adderall 5 mg for three months with monthly pick-up due to [facility] regulations. Instruct her to report any chest pain, palpitations, or syncope. Conduct one drug screen per year as per [facility] regulations. Schedule follow-up appointment in three months to reassess medication efficacy and dosage. Blurry vision Refer care to to ophthalmology Allergy, subsequent encounter AVS information given  ORDER ASSOCIATIONS  #   DIAGNOSIS / CONDITION ICD-10 ENCOUNTER ORDER     ICD-10-CM   1. Attention or concentration deficit  R41.840 amphetamine -dextroamphetamine  (ADDERALL XR) 5 MG 24 hr capsule    amphetamine -dextroamphetamine  (ADDERALL XR) 5 MG 24 hr capsule    amphetamine -dextroamphetamine  (ADDERALL XR) 5 MG 24 hr capsule    2. Blurry vision  H53.8 Ambulatory referral to Ophthalmology    3. Allergy, subsequent encounter  T78.40XD levocetirizine (XYZAL ) 5 MG tablet     Meds ordered this encounter  Medications   amphetamine -dextroamphetamine  (ADDERALL XR) 5 MG 24 hr capsule    Sig: Take 1 capsule (5 mg total) by mouth daily.    Dispense:  30 capsule    Refill:  0   amphetamine -dextroamphetamine  (ADDERALL XR) 5 MG 24 hr capsule    Sig: Take 1 capsule (5 mg total) by mouth daily.    Dispense:  30 capsule    Refill:  0   amphetamine -dextroamphetamine  (ADDERALL XR) 5 MG 24 hr capsule    Sig: Take 1 capsule (5 mg total) by mouth daily.    Dispense:  30 capsule    Refill:  0   levocetirizine (XYZAL ) 5 MG tablet    Sig: Take 1 tablet (5 mg total) by mouth every evening.    Dispense:  90 tablet    Refill:  3   Orders Placed This Encounter  Procedures   Ambulatory referral to Ophthalmology    Referral Priority:   Routine    Referral Type:   Consultation    Referral Reason:   Specialty Services Required     Requested Specialty:   Ophthalmology    Number of Visits Requested:   1      This document was synthesized by artificial intelligence ([employer]) using HIPAA-compliant recording of  the clinical interaction;   We discussed the use of AI scribe software for clinical note transcription with the patient, who gave verbal consent to proceed. additional Info: This encounter employed state-of-the-art, real-time, collaborative documentation. The patient actively reviewed and assisted in updating their electronic medical record on a shared screen, ensuring transparency and facilitating joint problem-solving for the problem list, overview, and plan. This approach promotes accurate, informed care. The treatment plan was discussed and reviewed in detail, including medication safety, potential side effects, and all patient questions. We confirmed understanding and comfort with the plan. Follow-up instructions were established, including contacting the office for any concerns, returning if symptoms worsen, persist, or new symptoms develop, and precautions for potential emergency department visits.

## 2023-09-05 NOTE — Assessment & Plan Note (Signed)
 ADHD was previously diagnosed by Dr. Wendolyn. She has not experienced significant effects from prior low-dose Adderall (5 mg). Discussed potential risks of stimulant medications, including high potential for abuse, misuse, dependence, sudden death, myocardial infarction, and psychosis. Withdrawal symptoms such as dysphoria or anhedonia were explained. Alternative non-controlled medication, Strattera, was discussed, which has less risk but also less efficacy. She prefers to start with a low dose of Adderall and adjust as needed. Emphasized the importance of secure storage to prevent theft due to the high potential for misuse and the ongoing methamphetamine crisis. Prescribe Adderall 5 mg for three months with monthly pick-up due to [facility] regulations. Instruct her to report any chest pain, palpitations, or syncope. Conduct one drug screen per year as per [facility] regulations. Schedule follow-up appointment in three months to reassess medication efficacy and dosage.

## 2023-09-24 ENCOUNTER — Ambulatory Visit (INDEPENDENT_AMBULATORY_CARE_PROVIDER_SITE_OTHER): Admitting: Obstetrics and Gynecology

## 2023-09-24 ENCOUNTER — Encounter: Payer: Self-pay | Admitting: Internal Medicine

## 2023-09-24 ENCOUNTER — Other Ambulatory Visit (HOSPITAL_BASED_OUTPATIENT_CLINIC_OR_DEPARTMENT_OTHER): Payer: Self-pay

## 2023-09-24 ENCOUNTER — Ambulatory Visit: Payer: Self-pay | Admitting: Obstetrics and Gynecology

## 2023-09-24 ENCOUNTER — Encounter: Payer: Self-pay | Admitting: Obstetrics and Gynecology

## 2023-09-24 VITALS — BP 110/70 | HR 74 | Ht 64.17 in | Wt 146.6 lb

## 2023-09-24 DIAGNOSIS — K2960 Other gastritis without bleeding: Secondary | ICD-10-CM | POA: Diagnosis not present

## 2023-09-24 DIAGNOSIS — R102 Pelvic and perineal pain: Secondary | ICD-10-CM | POA: Diagnosis not present

## 2023-09-24 DIAGNOSIS — R1010 Upper abdominal pain, unspecified: Secondary | ICD-10-CM

## 2023-09-24 DIAGNOSIS — B3731 Acute candidiasis of vulva and vagina: Secondary | ICD-10-CM

## 2023-09-24 DIAGNOSIS — K5790 Diverticulosis of intestine, part unspecified, without perforation or abscess without bleeding: Secondary | ICD-10-CM | POA: Diagnosis not present

## 2023-09-24 LAB — URINALYSIS
Bilirubin Urine: NEGATIVE
Hgb urine dipstick: NEGATIVE
Ketones, ur: NEGATIVE
Leukocytes,Ua: NEGATIVE
Nitrite: NEGATIVE
Protein, ur: NEGATIVE
Specific Gravity, Urine: 1.025 (ref 1.001–1.035)
pH: 5.5 (ref 5.0–8.0)

## 2023-09-24 MED ORDER — FAMOTIDINE 20 MG PO TABS
20.0000 mg | ORAL_TABLET | Freq: Two times a day (BID) | ORAL | 6 refills | Status: AC
  Filled 2023-09-24: qty 30, 15d supply, fill #0
  Filled 2023-12-02: qty 30, 15d supply, fill #1
  Filled 2024-02-17: qty 30, 15d supply, fill #2

## 2023-09-24 NOTE — Progress Notes (Signed)
 48 y.o. y.o. female here for abdominal pain for a little over a year Patient's last menstrual period was 08/13/2021 (approximate).    Pelvic discharge: denies H/o LTCS x1 and hysterectomy for symptomatic fibroids. Ovaries intact.  Pain is diffuse when it comes and can be severe at times and has sent her to the ER. Has reflux and pain is felt in her epigastric and across her lower pelvis.  Has not noticed anything that makes it worse or better. Does have urinary frequency from diabetes. No Dysuria Has done cologuard and reports this as normal Certain foods like milk can give her abdominal pain and cramping  Has also noticed some pruritus at her cesarean incision  2022 ct of pelvic with fibroids and diverticulosis Body mass index is 25.03 kg/m.    Blood pressure 110/70, pulse 74, height 5' 4.17 (1.63 m), weight 146 lb 9.6 oz (66.5 kg), last menstrual period 08/13/2021, SpO2 97%.     Component Value Date/Time   DIAGPAP  06/26/2021 1109    - Negative for Intraepithelial Lesions or Malignancy (NILM)   DIAGPAP - Benign reactive/reparative changes 06/26/2021 1109   HPVHIGH Negative 06/26/2021 1109   ADEQPAP  06/26/2021 1109    Satisfactory but limited for evaluation with partially obscuring blood;   ADEQPAP no transformation zone component present. 06/26/2021 1109    GYN HISTORY:    Component Value Date/Time   DIAGPAP  06/26/2021 1109    - Negative for Intraepithelial Lesions or Malignancy (NILM)   DIAGPAP - Benign reactive/reparative changes 06/26/2021 1109   HPVHIGH Negative 06/26/2021 1109   ADEQPAP  06/26/2021 1109    Satisfactory but limited for evaluation with partially obscuring blood;   ADEQPAP no transformation zone component present. 06/26/2021 1109    OB History  Gravida Para Term Preterm AB Living  4 3 2 1 1 2   SAB IAB Ectopic Multiple Live Births  1   1 3     # Outcome Date GA Lbr Len/2nd Weight Sex Type Anes PTL Lv  4 Term 2008 [redacted]w[redacted]d   F CS-LTranv    LIV     Birth Comments: compound presentation  3A Preterm 2005 [redacted]w[redacted]d       ND     Birth Comments: twins  3B Preterm 2005 [redacted]w[redacted]d         2 Term 1998 [redacted]w[redacted]d  6 lb (2.722 kg) F Vag-Spont   LIV     Birth Comments: no complications, born [facility]  1 SAB 1993            Past Medical History:  Diagnosis Date   Anemia    Asthma    Blood transfusion without reported diagnosis    Gestational diabetes    Postoperative anemia due to acute blood loss 10/09/2021   Lab Results      Component    Value    Date/Time           MCV    87.3    01/26/2023 10:16 AM           MCV    87.4    08/11/2022 02:15 PM           MCV    84.8    03/12/2022 12:08 PM           MCV    81.9    01/12/2022 10:48 AM           MCV    80.7    12/06/2021 07:49 PM  MCV    75.8 (L)    05/27/2015 02:41 PM             Lab Results      Component    Value    Date/Time           HGB      Type 2 diabetes mellitus (HCC)     Past Surgical History:  Procedure Laterality Date   arm surgery     CERVICAL CERCLAGE     CESAREAN SECTION     DILATION AND CURETTAGE OF UTERUS     HYSTERECTOMY ABDOMINAL WITH SALPINGECTOMY Bilateral 10/08/2021   Procedure: ABDOMINAL HYSTERECTOMY WITH BILATERAL  SALPINGECTOMY;  Surgeon: Cleatus Moccasin, MD;  Location: [facility] OR;  Service: Gynecology;  Laterality: Bilateral;   IR RADIOLOGIST EVAL & MGMT  09/04/2021   TOOTH EXTRACTION      Current Outpatient Medications on File Prior to Visit  Medication Sig Dispense Refill   Accu-Chek Softclix Lancets lancets Use as instructed 100 each 12   albuterol  (VENTOLIN  HFA) 108 (90 Base) MCG/ACT inhaler Inhale 2 puffs into the lungs every 6 (six) hours as needed for wheezing or shortness of breath. 36 g 99   amphetamine -dextroamphetamine  (ADDERALL XR) 5 MG 24 hr capsule Take 1 capsule (5 mg total) by mouth daily. 30 capsule 0   Blood Glucose Monitoring Suppl (ACCU-CHEK GUIDE) w/Device KIT Use to test blood sugar daily 1 kit 1   Calcium Carb-Cholecalciferol  (CALCIUM + D3 PO)  Take 1 tablet by mouth daily. When able to remember     ciclopirox  (LOPROX ) 0.77 % cream Apply 1 Application topically 2 (two) times daily. 90 g 1   clindamycin  (CLINDAGEL ) 1 % gel Apply topically 2 (two) times daily. 30 g 1   Cyanocobalamin  (B-12) 1000 MCG TBCR Take 1,000 mcg by mouth daily. When able to remember 100 tablet 3   dapagliflozin  propanediol (FARXIGA ) 5 MG TABS tablet Take 1 tablet (5 mg total) by mouth daily before breakfast. 90 tablet 3   escitalopram  (LEXAPRO ) 10 MG tablet Take 1 tablet (10 mg total) by mouth daily. Take half tablet for first 2 weeks to start. 30 tablet 1   fluticasone  (FLONASE ) 50 MCG/ACT nasal spray Place 1 spray into both nostrils in the morning and at bedtime. 16 g 5   glucose blood (ACCU-CHEK GUIDE TEST) test strip Use to test blood sugar daily 100 each 12   glucose blood (ACCU-CHEK GUIDE) test strip daily 100 each 3   glucose blood test strip Use as instructed 100 each 12   levocetirizine (XYZAL ) 5 MG tablet Take 1 tablet (5 mg total) by mouth every evening. 90 tablet 3   nystatin -triamcinolone  ointment (MYCOLOG) Apply 1 Application topically 2 (two) times daily. 30 g 0   ondansetron  (ZOFRAN -ODT) 4 MG disintegrating tablet Dissolve 1 tablet under the tongue every 8 (eight) hours as needed for nausea or vomiting. 20 tablet 2   ondansetron  (ZOFRAN -ODT) 4 MG disintegrating tablet Dissolve 1 tablet under the tongue every 6 (six) hours as needed for nausea. 20 tablet 0   Saline (SIMPLY SALINE) 0.9 % AERS Place 1 spray into the nose daily at 6 (six) AM. 127 mL 11   Semaglutide ,0.25 or 0.5MG /DOS, (OZEMPIC , 0.25 OR 0.5 MG/DOSE,) 2 MG/3ML SOPN Inject 0.5 mg into the skin once a week. 3 mL 2   terbinafine  (LAMISIL ) 250 MG tablet Take 1 tablet (250 mg total) by mouth daily. For toenail fungal infection 84 tablet 0   tretinoin  (RETIN-A ) 0.025 %  cream Apply topically at bedtime. 45 g 0   triamcinolone  cream (KENALOG ) 0.1 % Apply 1 Application topically 2 (two) times daily.  30 g 0   Vitamin D , Ergocalciferol , (DRISDOL ) 1.25 MG (50000 UNIT) CAPS capsule Take 1 capsule (50,000 Units total) by mouth every 7 (seven) days. 12 capsule 1   [START ON 11/01/2023] amphetamine -dextroamphetamine  (ADDERALL XR) 5 MG 24 hr capsule Take 1 capsule (5 mg total) by mouth daily. 30 capsule 0   [START ON 10/02/2023] amphetamine -dextroamphetamine  (ADDERALL XR) 5 MG 24 hr capsule Take 1 capsule (5 mg total) by mouth daily. 30 capsule 0   Cholecalciferol  (VITAMIN D3) 125 MCG (5000 UT) CAPS Take 1 capsule (5,000 Units total) by mouth daily. (Patient not taking: Reported on 09/24/2023) 90 capsule 3   Lancets (FREESTYLE) lancets Use as instructed (Patient not taking: Reported on 09/24/2023) 100 each 12   No current facility-administered medications on file prior to visit.    Social History   Socioeconomic History   Marital status: Single    Spouse name: Not on file   Number of children: 2   Years of education: Not on file   Highest education level: Not on file  Occupational History   Not on file  Tobacco Use   Smoking status: Never   Smokeless tobacco: Never  Vaping Use   Vaping status: Never Used  Substance and Sexual Activity   Alcohol use: No   Drug use: No   Sexual activity: Not Currently    Birth control/protection: Condom  Other Topics Concern   Not on file  Social History Narrative   Not on file   Social Drivers of Health   Financial Resource Strain: Not on file  Food Insecurity: Low Risk  (09/01/2023)   Received from [facility]   Hunger Vital Sign    Within the past 12 months, you worried that your food would run out before you got money to buy more: Never true    Within the past 12 months, the food you bought just didn't last and you didn't have money to get more. : Never true  Transportation Needs: No Transportation Needs (09/01/2023)   Received from [facility]    In the past 12 months, has lack of reliable transportation kept you from  medical appointments, meetings, work or from getting things needed for daily living? : No  Physical Activity: Not on file  Stress: Not on file  Social Connections: Not on file  Intimate Partner Violence: Not on file    Family History  Problem Relation Age of Onset   Hypertension Mother    Hypertension Brother    Breast cancer Maternal Aunt    Hypertension Maternal Uncle      Allergies  Allergen Reactions   Shellfish Allergy Itching and Swelling    Throat swells    Iodinated Contrast Media Itching   Other Itching and Swelling    Hazelnut. Swelling of throat    Latex Itching, Other (See Comments), Rash and Dermatitis    Burning  Other Reaction(s): Other (See Comments)    Burning      Patient's last menstrual period was Patient's last menstrual period was 08/13/2021 (approximate)..            Review of Systems Alls systems reviewed and are negative.     Physical Exam Constitutional:      Appearance: Normal appearance.  Abdominal:     General: Bowel sounds are normal.     Tenderness:  There is no abdominal tenderness.      Comments: No palpable masses  Musculoskeletal:     Cervical back: Normal range of motion.  Neurological:     Mental Status: She is alert and oriented to person, place, and time.  Psychiatric:        Mood and Affect: Mood normal.        Behavior: Behavior normal.  Vitals and nursing note reviewed.       A:         abdominal pain, history of diverticulosis, hysterectomy with ovaries intact Epigastric pain,                              P:      referral for PUS to evaluate ovaries for any gyn causes of the pain.  Most likely GI related such as H.pylori or diverticulosis.  Referral to GI placed for evaluation. Pepcid  sent to her pharmacy.  RTC with persistent or worsening s/s.  No follow-ups on file.  Almarie MARLA Carpen

## 2023-09-26 LAB — URINE CULTURE
MICRO NUMBER:: 16870448
Result:: NO GROWTH
SPECIMEN QUALITY:: ADEQUATE

## 2023-09-27 ENCOUNTER — Other Ambulatory Visit: Payer: Self-pay

## 2023-09-27 DIAGNOSIS — B379 Candidiasis, unspecified: Secondary | ICD-10-CM

## 2023-09-27 MED ORDER — FLUCONAZOLE 150 MG PO TABS
150.0000 mg | ORAL_TABLET | Freq: Once | ORAL | 0 refills | Status: AC
  Filled 2023-09-27 – 2023-09-28 (×2): qty 1, 1d supply, fill #0

## 2023-09-28 ENCOUNTER — Other Ambulatory Visit (HOSPITAL_BASED_OUTPATIENT_CLINIC_OR_DEPARTMENT_OTHER): Payer: Self-pay

## 2023-10-07 ENCOUNTER — Ambulatory Visit: Admitting: Internal Medicine

## 2023-10-07 ENCOUNTER — Other Ambulatory Visit (HOSPITAL_BASED_OUTPATIENT_CLINIC_OR_DEPARTMENT_OTHER): Payer: Self-pay

## 2023-10-07 ENCOUNTER — Encounter: Payer: Self-pay | Admitting: Internal Medicine

## 2023-10-07 ENCOUNTER — Encounter: Payer: Self-pay | Admitting: Family

## 2023-10-07 VITALS — BP 100/60 | HR 74 | Temp 98.0°F | Ht 64.17 in | Wt 147.8 lb

## 2023-10-07 DIAGNOSIS — L299 Pruritus, unspecified: Secondary | ICD-10-CM

## 2023-10-07 DIAGNOSIS — Z7985 Long-term (current) use of injectable non-insulin antidiabetic drugs: Secondary | ICD-10-CM

## 2023-10-07 DIAGNOSIS — Z5987 Material hardship due to limited financial resources, not elsewhere classified: Secondary | ICD-10-CM

## 2023-10-07 DIAGNOSIS — F32A Depression, unspecified: Secondary | ICD-10-CM

## 2023-10-07 DIAGNOSIS — R41840 Attention and concentration deficit: Secondary | ICD-10-CM | POA: Diagnosis not present

## 2023-10-07 DIAGNOSIS — E1165 Type 2 diabetes mellitus with hyperglycemia: Secondary | ICD-10-CM | POA: Diagnosis not present

## 2023-10-07 DIAGNOSIS — M79641 Pain in right hand: Secondary | ICD-10-CM

## 2023-10-07 DIAGNOSIS — M79642 Pain in left hand: Secondary | ICD-10-CM

## 2023-10-07 DIAGNOSIS — B351 Tinea unguium: Secondary | ICD-10-CM

## 2023-10-07 DIAGNOSIS — D72819 Decreased white blood cell count, unspecified: Secondary | ICD-10-CM

## 2023-10-07 MED ORDER — NYSTATIN-TRIAMCINOLONE 100000-0.1 UNIT/GM-% EX OINT
1.0000 | TOPICAL_OINTMENT | Freq: Two times a day (BID) | CUTANEOUS | 0 refills | Status: AC
  Filled 2023-10-07: qty 30, 15d supply, fill #0

## 2023-10-07 MED ORDER — AMPHETAMINE-DEXTROAMPHET ER 5 MG PO CP24: 5.0000 mg | ORAL_CAPSULE | Freq: Every day | ORAL | 0 refills | Status: AC

## 2023-10-07 MED ORDER — AMPHETAMINE-DEXTROAMPHET ER 5 MG PO CP24: 5.0000 mg | ORAL_CAPSULE | Freq: Every day | ORAL | 0 refills | Status: DC

## 2023-10-07 MED ORDER — OZEMPIC (0.25 OR 0.5 MG/DOSE) 2 MG/3ML ~~LOC~~ SOPN
0.5000 mg | PEN_INJECTOR | SUBCUTANEOUS | 2 refills | Status: DC
  Filled 2023-10-07: qty 3, 28d supply, fill #0

## 2023-10-07 MED ORDER — CICLOPIROX 8 % EX SOLN
Freq: Every day | CUTANEOUS | 0 refills | Status: AC
  Filled 2023-10-07: qty 6.6, 30d supply, fill #0

## 2023-10-07 NOTE — Patient Instructions (Addendum)
 Happiness plan: Step 1: Wake up make coffee take Attention Deficit Hyperactivity Disorder (ADHD) medication(s) Step 2:   start exercising with trx type machine after stretching, use music and trx videos.  Take a shower and make yourself look and feel good.   Pamper yourself -- don't start trying to achieve everything until you've pampered yourself. 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   Tell it about your interests and skills Step 4:  make after that look for hobbies that you enjoy even if they don't make money. Step 5: go to bed same time every night - takes escitalopram  every night don't miss.   (Set up sleep scheduler on phone)      Lab Results  Component Value Date   WBC 4.2 08/24/2023   WBC 4.4 05/10/2023   WBC 3.7 (L) 01/26/2023   WBC 3.7 (L) 08/11/2022   WBC 3.6 (L) 03/12/2022   WBC 3.3 (L) 01/12/2022   WBC 4.1 12/06/2021   WBC 4.3 12/05/2021   WBC 3.5 (L) 11/13/2021   WBC 6.4 10/09/2021   WBC 6.8 10/09/2021   WBC 6.3 10/08/2021   WBC 4.4 10/08/2021   WBC 4.0 08/14/2021   WBC 3.9 (L) 06/16/2021   WBC 4.0 06/06/2021   WBC 4.6 10/10/2020   WBC 4.0 08/19/2020   WBC 2.5 (L) 06/26/2020   WBC 8.6 06/13/2019   WBC 6.1 06/12/2019   WBC 8.1 01/01/2016   WBC 10.8 (H) 12/31/2015   WBC 11.7 (H) 12/30/2015   WBC 11.9 (H) 12/30/2015   WBC 4.4 08/04/2015   WBC 3.5 (L) 05/27/2015   WBC 11.3 (H) 03/10/2014   WBC 3.9 (L) 04/22/2012   WBC 9.7 12/26/2006   WBC 5.1 12/25/2006   WBC 8.9 10/01/2006   WBC 6.4 07/13/2006   It was a pleasure seeing you today! Your health and satisfaction are our top priorities.  Bernardino Cone, MD  VISIT SUMMARY: During your follow-up visit, we discussed your diabetes, ADHD, depression, and other health concerns. Your diabetes management has significantly improved with Ozempic , and we discussed the possibility of using a continuous glucose monitor. We also reviewed your ADHD and depression medications, emphasizing the  importance of consistent use. Additionally, we addressed your fungal nail infection, pruritus, and hand pain, and provided referrals for further care. We also discussed your financial situation and provided resources for assistance.  YOUR PLAN: -TYPE 2 DIABETES MELLITUS WITH HYPERGLYCEMIA: Type 2 diabetes is a condition where your body does not use insulin  properly, leading to high blood sugar levels. Your diabetes is well-controlled with Ozempic , which has significantly lowered your A1c. Continue taking Ozempic  0.5 mg once a week and consider using a continuous glucose monitor like Dexcom. Maintain dietary modifications to keep your glucose levels stable.  -ATTENTION-DEFICIT HYPERACTIVITY DISORDER (ADHD): ADHD is a condition that affects your ability to focus and stay calm. You are managing your symptoms with Adderall, but it's important to take it consistently. Continue taking Adderall 5 mg as needed, but not more than prescribed. Try to take it in the morning or before tasks that require focus, and use a planner to organize your daily activities.  -DEPRESSION: Depression is a mental health condition that affects your mood and interest in activities. You are taking escitalopram  (Lexapro ) for your depression, but it's important to take it daily to manage your symptoms. Establish a structured daily routine that includes exercise, self-care, and planning. If needed, consider seeing a psychologist for therapy.  -  TINEA UNGUIUM (FUNGAL NAIL INFECTION): Tinea unguium is a fungal infection of the nails. You are treating it with ciclopirox  cream and will now also use ciclopirox  lacquer for better results. Stop taking the oral medication terbinafine .  -PRURITUS: Pruritus is itching, which you are experiencing around your navel area, possibly due to a fungal infection. Use nystatin  powder as prescribed to treat the itching.  -PAIN IN RIGHT HAND: You are experiencing pain in your right hand and need a second  opinion on hand surgery. We will refer you to a new provider for this.  -MATERIAL HARDSHIP DUE TO LIMITED FINANCIAL RESOURCES: Financial difficulties are affecting your access to healthcare and medications. We will refer you to social work for help with financial resources, including medication costs, utilities, and food support.  -LOW WHITE BLOOD CELL COUNT AND IRON  DEFICIENCY ANEMIA (HISTORY, CURRENTLY RESOLVED): Your anemia and low white blood cell count have improved since your hysterectomy. We will continue to monitor your blood counts periodically, but no further action is needed unless there are changes.  INSTRUCTIONS: Please follow up with the new hand surgery referral and the social work referral for fi nancial assistance. Continue with your current medications and consider using a continuous glucose monitor. Maintain a consistent routine for taking your medications and managing your health. If you have any new symptoms or concerns, please schedule another appointment.  Your Providers PCP: Jesus Bernardino MATSU, MD,  [phone]) Referring Provider: Jesus Bernardino MATSU, MD,  [phone])  NEXT STEPS: [x]  Early Intervention: Schedule sooner appointment, call our on-call services, or go to emergency room if there is any significant Increase in pain or discomfort New or worsening symptoms Sudden or severe changes in your health [x]  Flexible Follow-Up: We recommend a Return in about 1 month (around 11/06/2023). for optimal routine care. This allows for progress monitoring and treatment adjustments. [x]  Preventive Care: Schedule your annual preventive care visit! It's typically covered by insurance and helps identify potential health issues early. [x]  Lab & X-ray Appointments: Incomplete tests scheduled today, or call to schedule. X-rays: [facility] (M-F, 8:30am-noon or 1pm-5pm). [x]  Medical Information Release: Sign a release form at front desk to obtain relevant medical  information we don't have.  MAKING THE MOST OF OUR FOCUSED 20 MINUTE APPOINTMENTS: [x]   Clearly state your top concerns at the beginning of the visit to focus our discussion [x]   If you anticipate you will need more time, please inform the front desk during scheduling - we can book multiple appointments in the same week. [x]   If you have transportation problems- use our convenient video appointments or ask about transportation support. [x]   We can get down to business faster if you use MyChart to update information before the visit and submit non-urgent questions before your visit. Thank you for taking the time to provide details through MyChart.  Let our nurse know and she can import this information into your encounter documents.  Arrival and Wait Times: [x]   Arriving on time ensures that everyone receives prompt attention. [x]   Early morning (8a) and afternoon (1p) appointments tend to have shortest wait times. [x]   Unfortunately, we cannot delay appointments for late arrivals or hold slots during phone calls.  Getting Answers and Following Up [x]   Simple Questions & Concerns: For quick questions or basic follow-up after your visit, reach us  at [phone] or MyChart messaging. [x]   Complex Concerns: If your concern is more complex, scheduling an appointment might be best. Discuss this with the  staff to find the most suitable option. [x]   Lab & Imaging Results: We'll contact you directly if results are abnormal or you don't use MyChart. Most normal results will be on MyChart within 2-3 business days, with a review message from Dr. Jesus. Haven't heard back in 2 weeks? Need results sooner? Contact us  at [phone]. [x]   Referrals: Our referral coordinator will manage specialist referrals. The specialist's office should contact you within 2 weeks to schedule an appointment. Call us  if you haven't heard from them after 2 weeks.  Staying Connected [x]   MyChart: Activate your MyChart for  the fastest way to access results and message us . See the last page of this paperwork for instructions on how to activate.  Bring to Your Next Appointment [x]   Medications: Please bring all your medication bottles to your next appointment to ensure we have an accurate record of your prescriptions. [x]   Health Diaries: If you're monitoring any health conditions at home, keeping a diary of your readings can be very helpful for discussions at your next appointment.  Billing [x]   X-ray & Lab Orders: These are billed by separate companies. Contact the invoicing company directly for questions or concerns. [x]   Visit Charges: Discuss any billing inquiries with our administrative services team.  Your Satisfaction Matters [x]   Share Your Experience: We strive for your satisfaction! If you have any complaints, or preferably compliments, please let Dr. Jesus know directly or contact our Practice Administrators, Manuelita Rubin or [employer], by asking at the front desk.   Reviewing Your Records [x]   Review this early draft of your clinical encounter notes below and the final encounter summary tomorrow on MyChart after its been completed.  All orders placed so far are visible here: Attention or concentration deficit -     Amphetamine -Dextroamphet ER; Take 1 capsule (5 mg total) by mouth daily.  Dispense: 30 capsule; Refill: 0 -     AMB Referral [facility] Care Management -     Amphetamine -Dextroamphet ER; Take 1 capsule (5 mg total) by mouth daily.  Dispense: 30 capsule; Refill: 0 -     Amphetamine -Dextroamphet ER; Take 1 capsule (5 mg total) by mouth daily.  Dispense: 30 capsule; Refill: 0  Inadequate material resources -     AMB Referral [facility] Care Management  Pain in both hands -     Ambulatory referral to Hand Surgery  Pruritic condition -     Nystatin -Triamcinolone ; Apply 1 Application topically 2 (two) times daily.  Dispense: 30 g; Refill: 0  Type 2 diabetes mellitus with hyperglycemia, without  long-term current use of insulin  (HCC) -     Ozempic  (0.25 or 0.5 MG/DOSE); Inject 0.5 mg into the skin once a week.  Dispense: 3 mL; Refill: 2  Onychomycosis -     Ciclopirox ; Apply topically at bedtime. Apply over nail and surrounding skin. Apply daily over previous coat. After seven (7) days, may remove with alcohol and continue cycle.  Dispense: 6.6 mL; Refill: 0  Depression, unspecified depression type  Leukopenia, unspecified type

## 2023-10-07 NOTE — Progress Notes (Signed)
 ==============================  [facility]: [phone]   -- Medical Office Visit --  Patient: [name]      Age: 48 y.o.       Sex:  female  Date:   10/07/2023 Today's Healthcare Provider: Bernardino KANDICE Cone, MD  ==============================   Chief Complaint: Diabetes Attention Deficit Hyperactivity Disorder (ADHD) depression  Discussed the use of AI scribe software for clinical note transcription with the patient, who gave verbal consent to proceed.  History of Present Illness 48 year old female with diabetes, ADHD, and depression who presents for a follow-up visit.  She has experienced significant improvement in her diabetes management since starting Ozempic , with her A1c dropping from 11.4% to 6.5%. She is not currently monitoring her blood sugar at home but is considering using a continuous glucose monitor. She reports that she has not experienced gastrointestinal side effects such as diarrhea with Ozempic , though she notes that her bowel habits may vary depending on her diet.  Regarding her ADHD, she is currently taking Adderall 5 mg as needed and feels calmer and more focused when she takes it. However, she sometimes misses doses and is working on maintaining a consistent schedule.  For her depression, she has been prescribed escitalopram  but admits to inconsistent use. She acknowledges the need to take it regularly to manage her symptoms effectively. She describes a lack of motivation and difficulty engaging in activities she once enjoyed, which she attributes to her depression.  She has a history of anemia and low white blood cell counts, which have been monitored over time. Her anemia improved following a hysterectomy in September 2023, and she no longer requires iron  infusions. She has a history of low white blood cell counts, which have been monitored over time. She reports no recent infections, fever, weight loss, bruising,  fatigue, or joint pain.  She has several outstanding referrals, including ophthalmology, podiatry, and dermatology, and is awaiting appointments. She has a family history of breast cancer and has experienced postmenopausal symptoms such as hot flashes.  Socially, she is currently not working and is exploring ways to improve her financial situation.  Patient reports not hearing from referrals Ophto from 7/31 Podiatry from 7/7  Lab Results  Component Value Date   HGBA1C 6.5 (A) 08/11/2023   HGBA1C 11.4 (H) 05/10/2023   HGBA1C 7.0 (H) 01/26/2023      Lab Results  Component Value Date   WBC 4.2 08/24/2023   WBC 4.4 05/10/2023   WBC 3.7 (L) 01/26/2023   WBC 3.7 (L) 08/11/2022   WBC 3.6 (L) 03/12/2022   WBC 3.3 (L) 01/12/2022   WBC 4.1 12/06/2021   WBC 4.3 12/05/2021   WBC 3.5 (L) 11/13/2021   WBC 6.4 10/09/2021   WBC 6.8 10/09/2021   WBC 6.3 10/08/2021   WBC 4.4 10/08/2021   WBC 4.0 08/14/2021   WBC 3.9 (L) 06/16/2021   WBC 4.0 06/06/2021   WBC 4.6 10/10/2020   WBC 4.0 08/19/2020   WBC 2.5 (L) 06/26/2020   WBC 8.6 06/13/2019   WBC 6.1 06/12/2019   WBC 8.1 01/01/2016   WBC 10.8 (H) 12/31/2015   WBC 11.7 (H) 12/30/2015   WBC 11.9 (H) 12/30/2015   WBC 4.4 08/04/2015   WBC 3.5 (L) 05/27/2015   WBC 11.3 (H) 03/10/2014   WBC 3.9 (L) 04/22/2012   WBC 9.7 12/26/2006   WBC 5.1 12/25/2006   WBC 8.9 10/01/2006   WBC 6.4 07/13/2006   Lab Results  Component Value Date   HGB 14.2 08/24/2023   HGB 15.1 (H) 05/10/2023   HGB 13.6 01/26/2023   HGB 13.3 08/11/2022   HGB 13.7 03/12/2022   HGB 13.6 01/12/2022   HGB 13.1 12/06/2021   HGB 15.6 (H) 12/05/2021   HGB 14.5 12/05/2021   HGB 10.7 (L) 11/13/2021   HGB 8.9 (L) 10/09/2021   HGB 6.0 (LL) 10/09/2021   HGB 7.6 (L) 10/08/2021   HGB 9.1 (L) 10/08/2021   HGB 10.7 (L) 08/14/2021   HGB 9.3 (L) 06/16/2021   HGB 8.8 (L) 06/06/2021   HGB 6.9 (LL) 10/10/2020   HGB 9.3 (L) 08/19/2020   HGB 6.3 (LL) 06/26/2020   HGB 9.9 (L)  06/13/2019   HGB 4.4 (LL) 06/12/2019   HGB 8.3 (L) 01/01/2016   HGB 5.9 (LL) 12/31/2015   HGB 6.1 (LL) 12/30/2015   HGB 6.8 (LL) 12/30/2015   HGB 13.0 08/04/2015   HGB 9.2 (L) 05/27/2015   HGB 8.1 (L) 03/10/2014   HGB 6.7 (LL) 04/22/2012   HGB 10.4 DELTA CHECK NOTED (L) 12/26/2006   HGB 13.3 12/25/2006   HGB 12.6 10/01/2006   HGB 10.9 (L) 07/13/2006    Background Reviewed: Problem List: has Iron  deficiency anemia due to chronic blood loss; Vitamin D  deficiency; Mixed anxiety and depressive disorder; Allergic rhinitis; Type 2 diabetes mellitus with hyperglycemia, without long-term current use of insulin  (HCC); Mild intermittent asthma without complication; Attention or concentration deficit; Chronic pain after hysterectomy; PTSD (post-traumatic stress disorder); Central adiposity; Acne; Leukopenia; Postmenopausal estrogen deficiency; Urinary frequency; Vision impairment; 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 Past Medical History:  has a past medical history of Anemia, Asthma, Blood transfusion without reported diagnosis, Diverticulosis, Gestational diabetes, Postoperative anemia due to acute blood loss (10/09/2021), and Type 2 diabetes mellitus (HCC). Past Surgical History:   has a past surgical history that includes arm surgery; Dilation and curettage of uterus; Cesarean section; Tooth extraction; Cervical cerclage; IR Radiologist Eval & Mgmt (09/04/2021); and Hysterectomy abdominal with salpingectomy (Bilateral, 10/08/2021). Social History:   reports that she has never smoked. She has never used smokeless tobacco. She reports that she does not drink alcohol and does not use drugs. Family History:  family history includes Breast cancer in her maternal aunt; Hypertension in her brother, maternal uncle, and mother. Allergies:  is allergic to shellfish  allergy, iodinated contrast media, other, and latex.   Medication Reconciliation: Current Outpatient Medications on File Prior to Visit  Medication Sig   Accu-Chek Softclix Lancets lancets Use as instructed   albuterol  (VENTOLIN  HFA) 108 (90 Base) MCG/ACT inhaler Inhale 2 puffs into the lungs every 6 (six) hours as needed for wheezing or shortness of breath.   amphetamine -dextroamphetamine  (ADDERALL XR) 5 MG 24 hr capsule Take 1 capsule (5 mg total) by mouth daily.   amphetamine -dextroamphetamine  (ADDERALL XR) 5 MG 24 hr capsule Take 1 capsule (5 mg total) by mouth daily.   Blood Glucose Monitoring Suppl (ACCU-CHEK GUIDE) w/Device KIT Use to test blood sugar daily   Calcium Carb-Cholecalciferol  (CALCIUM + D3 PO) Take 1 tablet by mouth daily. When able to remember   Cholecalciferol  (VITAMIN D3) 125 MCG (5000 UT) CAPS Take 1 capsule (5,000 Units total) by mouth daily. (Patient not taking: Reported on 09/24/2023)   ciclopirox  (LOPROX ) 0.77 % cream Apply 1 Application topically 2 (two) times daily.   clindamycin  (CLINDAGEL ) 1 % gel  Apply topically 2 (two) times daily.   Cyanocobalamin  (B-12) 1000 MCG TBCR Take 1,000 mcg by mouth daily. When able to remember   dapagliflozin  propanediol (FARXIGA ) 5 MG TABS tablet Take 1 tablet (5 mg total) by mouth daily before breakfast.   escitalopram  (LEXAPRO ) 10 MG tablet Take 1 tablet (10 mg total) by mouth daily. Take half tablet for first 2 weeks to start.   famotidine  (PEPCID ) 20 MG tablet Take 1 tablet (20 mg total) by mouth 2 (two) times daily.   fluticasone  (FLONASE ) 50 MCG/ACT nasal spray Place 1 spray into both nostrils in the morning and at bedtime.   glucose blood (ACCU-CHEK GUIDE TEST) test strip Use to test blood sugar daily   glucose blood (ACCU-CHEK GUIDE) test strip daily   glucose blood test strip Use as instructed   Lancets (FREESTYLE) lancets Use as instructed (Patient not taking: Reported on 09/24/2023)   levocetirizine (XYZAL ) 5 MG tablet Take  1 tablet (5 mg total) by mouth every evening.   ondansetron  (ZOFRAN -ODT) 4 MG disintegrating tablet Dissolve 1 tablet under the tongue every 8 (eight) hours as needed for nausea or vomiting.   ondansetron  (ZOFRAN -ODT) 4 MG disintegrating tablet Dissolve 1 tablet under the tongue every 6 (six) hours as needed for nausea.   Saline (SIMPLY SALINE) 0.9 % AERS Place 1 spray into the nose daily at 6 (six) AM.   terbinafine  (LAMISIL ) 250 MG tablet Take 1 tablet (250 mg total) by mouth daily. For toenail fungal infection   tretinoin  (RETIN-A ) 0.025 % cream Apply topically at bedtime.   triamcinolone  cream (KENALOG ) 0.1 % Apply 1 Application topically 2 (two) times daily.   Vitamin D , Ergocalciferol , (DRISDOL ) 1.25 MG (50000 UNIT) CAPS capsule Take 1 capsule (50,000 Units total) by mouth every 7 (seven) days.   No current facility-administered medications on file prior to visit.   Medications Discontinued During This Encounter  Medication Reason   amphetamine -dextroamphetamine  (ADDERALL XR) 5 MG 24 hr capsule Dose change   Semaglutide ,0.25 or 0.5MG /DOS, (OZEMPIC , 0.25 OR 0.5 MG/DOSE,) 2 MG/3ML SOPN Reorder   nystatin -triamcinolone  ointment (MYCOLOG) Reorder     Physical Exam:    10/07/2023    9:40 AM 09/24/2023   10:37 AM 09/02/2023   12:45 PM  Vitals with BMI  Height 5' 4.17 5' 4.173 5' 4  Weight 147 lbs 13 oz 146 lbs 10 oz   BMI 25.23 25.03   Systolic 100 110 889  Diastolic 60 70 72  Pulse 74 74 78  Vital signs reviewed.  Nursing notes reviewed. Weight trend reviewed. Physical Activity: Not on file   General Appearance:  No acute distress appreciable.   Well-groomed, healthy-appearing female.  Well proportioned with no abnormal fat distribution.  Good muscle tone. Pulmonary:  Normal work of breathing at rest, no respiratory distress apparent. SpO2: 98 %  Musculoskeletal: All extremities are intact.  Neurological:  Awake, alert, oriented, and engaged.  No obvious focal neurological  deficits or cognitive impairments.  Sensorium seems unclouded.   Speech is clear and coherent with logical content. Psychiatric:  Appropriate mood, pleasant and cooperative demeanor, thoughtful and engaged during the exam  Results LABS Hemoglobin (Hb): Low between 2022 and 2025 White Blood Cell count (WBC): Fluctuated between low and high, normal since December 2024 Hemoglobin A1c (A1c): Ranged from 11.4 to 6.5     10/07/2023   10:31 AM 09/24/2023   10:46 AM 06/09/2023   12:06 PM 05/10/2023    1:47 PM  PHQ 2/9 Scores  PHQ -  2 Score 4 1 3 6   PHQ- 9 Score 21  18 24     Office Visit on 09/24/2023  Component Date Value Ref Range Status   Color, Urine 09/24/2023 YELLOW  YELLOW Final   APPearance 09/24/2023 CLEAR  CLEAR Final   Specific Gravity, Urine 09/24/2023 1.025  1.001 - 1.035 Final   pH 09/24/2023 5.5  5.0 - 8.0 Final   Glucose, UA 09/24/2023 3+ (A)  NEGATIVE Final   Bilirubin Urine 09/24/2023 NEGATIVE  NEGATIVE Final   Ketones, ur 09/24/2023 NEGATIVE  NEGATIVE Final   Hgb urine dipstick 09/24/2023 NEGATIVE  NEGATIVE Final   Protein, ur 09/24/2023 NEGATIVE  NEGATIVE Final   Nitrite 09/24/2023 NEGATIVE  NEGATIVE Final   Leukocytes,Ua 09/24/2023 NEGATIVE  NEGATIVE Final   MICRO NUMBER: 09/24/2023 [id number]   Final   SPECIMEN QUALITY: 09/24/2023 Adequate   Final   Sample Source 09/24/2023 URINE   Final   STATUS: 09/24/2023 FINAL   Final   Result: 09/24/2023 No Growth   Final  Lab on 08/24/2023  Component Date Value Ref Range Status   Cholesterol 08/24/2023 159  0 - 200 mg/dL Final   Triglycerides 92/77/7974 62.0  0.0 - 149.0 mg/dL Final   HDL 92/77/7974 46.10  >39.00 mg/dL Final   VLDL 92/77/7974 12.4  0.0 - 40.0 mg/dL Final   LDL Cholesterol 08/24/2023 100 (H)  0 - 99 mg/dL Final   Total CHOL/HDL Ratio 08/24/2023 3   Final   NonHDL 08/24/2023 112.55   Final   Sodium 08/24/2023 138  135 - 145 mEq/L Final   Potassium 08/24/2023 3.7  3.5 - 5.1 mEq/L Final   Chloride 08/24/2023 103   96 - 112 mEq/L Final   CO2 08/24/2023 29  19 - 32 mEq/L Final   Glucose, Bld 08/24/2023 83  70 - 99 mg/dL Final   BUN 92/77/7974 14  6 - 23 mg/dL Final   Creatinine, Ser 08/24/2023 0.69  0.40 - 1.20 mg/dL Final   Total Bilirubin 08/24/2023 1.6 (H)  0.2 - 1.2 mg/dL Final   Alkaline Phosphatase 08/24/2023 40  39 - 117 U/L Final   AST 08/24/2023 15  0 - 37 U/L Final   ALT 08/24/2023 8  0 - 35 U/L Final   Total Protein 08/24/2023 7.3  6.0 - 8.3 g/dL Final   Albumin  08/24/2023 4.4  3.5 - 5.2 g/dL Final   GFR 92/77/7974 101.60  >60.00 mL/min Final   Calcium 08/24/2023 9.2  8.4 - 10.5 mg/dL Final   Microalb, Ur 92/77/7974 0.7  0.0 - 1.9 mg/dL Final   Creatinine,U 92/77/7974 142.9  mg/dL Final   Microalb Creat Ratio 08/24/2023 4.9  0.0 - 30.0 mg/g Final   WBC 08/24/2023 4.2  4.0 - 10.5 K/uL Final   RBC 08/24/2023 4.90  3.87 - 5.11 Mil/uL Final   Hemoglobin 08/24/2023 14.2  12.0 - 15.0 g/dL Final   HCT 92/77/7974 42.1  36.0 - 46.0 % Final   MCV 08/24/2023 85.8  78.0 - 100.0 fl Final   MCHC 08/24/2023 33.7  30.0 - 36.0 g/dL Final   RDW 92/77/7974 13.5  11.5 - 15.5 % Final   Platelets 08/24/2023 208.0  150.0 - 400.0 K/uL Final   Neutrophils Relative % 08/24/2023 37.3 (L)  43.0 - 77.0 % Final   Lymphocytes Relative 08/24/2023 45.9  12.0 - 46.0 % Final   Monocytes Relative 08/24/2023 8.0  3.0 - 12.0 % Final   Eosinophils Relative 08/24/2023 6.7 (H)  0.0 - 5.0 % Final  Basophils Relative 08/24/2023 2.1  0.0 - 3.0 % Final   Neutro Abs 08/24/2023 1.6  1.4 - 7.7 K/uL Final   Lymphs Abs 08/24/2023 1.9  0.7 - 4.0 K/uL Final   Monocytes Absolute 08/24/2023 0.3  0.1 - 1.0 K/uL Final   Eosinophils Absolute 08/24/2023 0.3  0.0 - 0.7 K/uL Final   Basophils Absolute 08/24/2023 0.1  0.0 - 0.1 K/uL Final  Orders Only on 08/11/2023  Component Date Value Ref Range Status   Hemoglobin A1C 08/11/2023 6.5 (A)  4.0 - 5.6 % Final  Office Visit on 06/09/2023  Component Date Value Ref Range Status   Color, UA  06/09/2023 yellow   Final   Clarity, UA 06/09/2023 clear   Final   Glucose, UA 06/09/2023 Positive (A)  Negative Final   Bilirubin, UA 06/09/2023 negative   Final   Ketones, UA 06/09/2023 +-5   Final   Spec Grav, UA 06/09/2023 1.025  1.010 - 1.025 Final   Blood, UA 06/09/2023 Negative   Final   pH, UA 06/09/2023 5.0  5.0 - 8.0 Final   Protein, UA 06/09/2023 Negative  Negative Final   Urobilinogen, UA 06/09/2023 0.2  0.2 or 1.0 E.U./dL Final   Nitrite, UA 94/92/7974 negative   Final   Leukocytes, UA 06/09/2023 Negative  Negative Final   Odor 06/09/2023 negative   Final  Office Visit on 05/10/2023  Component Date Value Ref Range Status   WBC 05/10/2023 4.4  4.0 - 10.5 K/uL Final   RBC 05/10/2023 5.12 (H)  3.87 - 5.11 Mil/uL Final   Hemoglobin 05/10/2023 15.1 (H)  12.0 - 15.0 g/dL Final   HCT 95/92/7974 44.2  36.0 - 46.0 % Final   MCV 05/10/2023 86.3  78.0 - 100.0 fl Final   MCHC 05/10/2023 34.2  30.0 - 36.0 g/dL Final   RDW 95/92/7974 13.6  11.5 - 15.5 % Final   Platelets 05/10/2023 205.0  150.0 - 400.0 K/uL Final   Neutrophils Relative % 05/10/2023 38.6 (L)  43.0 - 77.0 % Final   Lymphocytes Relative 05/10/2023 44.6  12.0 - 46.0 % Final   Monocytes Relative 05/10/2023 7.3  3.0 - 12.0 % Final   Eosinophils Relative 05/10/2023 8.0 (H)  0.0 - 5.0 % Final   Basophils Relative 05/10/2023 1.5  0.0 - 3.0 % Final   Neutro Abs 05/10/2023 1.7  1.4 - 7.7 K/uL Final   Lymphs Abs 05/10/2023 2.0  0.7 - 4.0 K/uL Final   Monocytes Absolute 05/10/2023 0.3  0.1 - 1.0 K/uL Final   Eosinophils Absolute 05/10/2023 0.3  0.0 - 0.7 K/uL Final   Basophils Absolute 05/10/2023 0.1  0.0 - 0.1 K/uL Final   Sodium 05/10/2023 138  135 - 145 mEq/L Final   Potassium 05/10/2023 4.3  3.5 - 5.1 mEq/L Final   Chloride 05/10/2023 100  96 - 112 mEq/L Final   CO2 05/10/2023 29  19 - 32 mEq/L Final   Glucose, Bld 05/10/2023 347 (H)  70 - 99 mg/dL Final   BUN 95/92/7974 15  6 - 23 mg/dL Final   Creatinine, Ser  05/10/2023 0.77  0.40 - 1.20 mg/dL Final   Total Bilirubin 05/10/2023 1.3 (H)  0.2 - 1.2 mg/dL Final   Alkaline Phosphatase 05/10/2023 61  39 - 117 U/L Final   AST 05/10/2023 12  0 - 37 U/L Final   ALT 05/10/2023 11  0 - 35 U/L Final   Total Protein 05/10/2023 7.5  6.0 - 8.3 g/dL Final  Albumin  05/10/2023 4.5  3.5 - 5.2 g/dL Final   GFR 95/92/7974 90.49  >60.00 mL/min Final   Calcium 05/10/2023 9.7  8.4 - 10.5 mg/dL Final   Cholesterol 95/92/7974 157  0 - 200 mg/dL Final   Triglycerides 95/92/7974 85.0  0.0 - 149.0 mg/dL Final   HDL 95/92/7974 47.60  >39.00 mg/dL Final   VLDL 95/92/7974 17.0  0.0 - 40.0 mg/dL Final   LDL Cholesterol 05/10/2023 93  0 - 99 mg/dL Final   Total CHOL/HDL Ratio 05/10/2023 3   Final   NonHDL 05/10/2023 109.51   Final   Hgb A1c MFr Bld 05/10/2023 11.4 (H)  4.6 - 6.5 % Final   VITD 05/10/2023 29.53 (L)  30.00 - 100.00 ng/mL Final   Vitamin B-12 05/10/2023 >1537 (H)  211 - 911 pg/mL Final   Folate 05/10/2023 >25.2  >5.9 ng/mL Final   Microalb, Ur 05/10/2023 1.0  0.0 - 1.9 mg/dL Final   Creatinine,U 95/92/7974 111.8  mg/dL Final   Microalb Creat Ratio 05/10/2023 9.1  0.0 - 30.0 mg/g Final   Color, Urine 05/10/2023 YELLOW  YELLOW Final   APPearance 05/10/2023 CLEAR  CLEAR Final   Specific Gravity, Urine 05/10/2023 1.039 (H)  1.001 - 1.035 Final   pH 05/10/2023 < OR = 5.0  5.0 - 8.0 Final   Glucose, UA 05/10/2023 3+ (A)  NEGATIVE Final   Bilirubin Urine 05/10/2023 NEGATIVE  NEGATIVE Final   Ketones, ur 05/10/2023 3+ (A)  NEGATIVE Final   Hgb urine dipstick 05/10/2023 NEGATIVE  NEGATIVE Final   Protein, ur 05/10/2023 NEGATIVE  NEGATIVE Final   Nitrites, Initial 05/10/2023 NEGATIVE  NEGATIVE Final   Leukocyte Esterase 05/10/2023 NEGATIVE  NEGATIVE Final   WBC, UA 05/10/2023 0-5  0 - 5 /HPF Final   RBC / HPF 05/10/2023 NONE SEEN  0 - 2 /HPF Final   Squamous Epithelial / HPF 05/10/2023 0-5  < OR = 5 /HPF Final   Bacteria, UA 05/10/2023 NONE SEEN  NONE SEEN /HPF  Final   Hyaline Cast 05/10/2023 NONE SEEN  NONE SEEN /LPF Final   Note 05/10/2023    Final   Reflexve Urine Culture 05/10/2023    Final  Office Visit on 03/17/2023  Component Date Value Ref Range Status   Microalb, Ur 03/17/2023 <0.7  0.0 - 1.9 mg/dL Final   Creatinine,U 97/87/7974 97.5  mg/dL Final   Microalb Creat Ratio 03/17/2023 7.2  0.0 - 30.0 mg/g Final  Office Visit on 02/01/2023  Component Date Value Ref Range Status   SARS Coronavirus 2 Ag 02/01/2023 Negative  Negative Final   Rapid Strep A Screen 02/01/2023 Positive (A)  Negative Final   Influenza A, POC 02/01/2023 Positive (A)  Negative Final   Influenza B, POC 02/01/2023 Negative  Negative Final  Office Visit on 01/26/2023  Component Date Value Ref Range Status   WBC 01/26/2023 3.7 (L)  4.0 - 10.5 K/uL Final   RBC 01/26/2023 4.59  3.87 - 5.11 Mil/uL Final   Hemoglobin 01/26/2023 13.6  12.0 - 15.0 g/dL Final   HCT 87/75/7975 40.0  36.0 - 46.0 % Final   MCV 01/26/2023 87.3  78.0 - 100.0 fl Final   MCHC 01/26/2023 34.0  30.0 - 36.0 g/dL Final   RDW 87/75/7975 13.5  11.5 - 15.5 % Final   Platelets 01/26/2023 186.0  150.0 - 400.0 K/uL Final   Neutrophils Relative % 01/26/2023 44.6  43.0 - 77.0 % Final   Lymphocytes Relative 01/26/2023 41.9  12.0 -  46.0 % Final   Monocytes Relative 01/26/2023 7.6  3.0 - 12.0 % Final   Eosinophils Relative 01/26/2023 4.5  0.0 - 5.0 % Final   Basophils Relative 01/26/2023 1.4  0.0 - 3.0 % Final   Neutro Abs 01/26/2023 1.7  1.4 - 7.7 K/uL Final   Lymphs Abs 01/26/2023 1.6  0.7 - 4.0 K/uL Final   Monocytes Absolute 01/26/2023 0.3  0.1 - 1.0 K/uL Final   Eosinophils Absolute 01/26/2023 0.2  0.0 - 0.7 K/uL Final   Basophils Absolute 01/26/2023 0.1  0.0 - 0.1 K/uL Final   Sodium 01/26/2023 139  135 - 145 mEq/L Final   Potassium 01/26/2023 3.6  3.5 - 5.1 mEq/L Final   Chloride 01/26/2023 104  96 - 112 mEq/L Final   CO2 01/26/2023 29  19 - 32 mEq/L Final   Glucose, Bld 01/26/2023 123 (H)  70 - 99  mg/dL Final   BUN 87/75/7975 14  6 - 23 mg/dL Final   Creatinine, Ser 01/26/2023 0.62  0.40 - 1.20 mg/dL Final   Total Bilirubin 01/26/2023 1.2  0.2 - 1.2 mg/dL Final   Alkaline Phosphatase 01/26/2023 46  39 - 117 U/L Final   AST 01/26/2023 16  0 - 37 U/L Final   ALT 01/26/2023 10  0 - 35 U/L Final   Total Protein 01/26/2023 6.8  6.0 - 8.3 g/dL Final   Albumin  01/26/2023 4.1  3.5 - 5.2 g/dL Final   GFR 87/75/7975 104.68  >60.00 mL/min Final   Calcium 01/26/2023 8.9  8.4 - 10.5 mg/dL Final   Hgb J8r MFr Bld 01/26/2023 7.0 (H)  4.6 - 6.5 % Final   Cholesterol 01/26/2023 157  0 - 200 mg/dL Final   Triglycerides 87/75/7975 70.0  0.0 - 149.0 mg/dL Final   HDL 87/75/7975 44.90  >39.00 mg/dL Final   VLDL 87/75/7975 14.0  0.0 - 40.0 mg/dL Final   LDL Cholesterol 01/26/2023 98  0 - 99 mg/dL Final   Total CHOL/HDL Ratio 01/26/2023 3   Final   NonHDL 01/26/2023 111.77   Final   Amphetamines, Urine 01/26/2023 Negative  Cutoff=1000 ng/mL Final   Barbiturate Quant, Ur 01/26/2023 Negative  Cutoff=300 ng/mL Final   Benzodiazepine Quant, Ur 01/26/2023 Negative  Cutoff=300 ng/mL Final   Cannabinoid Quant, Ur 01/26/2023 Negative  Cutoff=50 ng/mL Final   Cocaine (Metab.) 01/26/2023 Negative  Cutoff=300 ng/mL Final   Opiate Quant, Ur 01/26/2023 Negative  Cutoff=300 ng/mL Final   PCP Quant, Ur 01/26/2023 Negative  Cutoff=25 ng/mL Final   Methadone Screen, Urine 01/26/2023 Negative  Cutoff=300 ng/mL Final   Propoxyphene 01/26/2023 Negative  Cutoff=300 ng/mL Final   Ethanol, Urine 01/26/2023 Negative  Cutoff=0.020 % Final  Lab on 11/11/2022  Component Date Value Ref Range Status   QuantiFERON-TB Gold Plus 11/11/2022 NEGATIVE  NEGATIVE Final   NIL 11/11/2022 0.03  IU/mL Final   Mitogen-NIL 11/11/2022 >10.00  IU/mL Final   TB1-NIL 11/11/2022 0.01  IU/mL Final   TB2-NIL 11/11/2022 0.00  IU/mL Final  Office Visit on 08/11/2022  Component Date Value Ref Range Status   WBC 08/11/2022 3.7 (L)  4.0 - 10.5 K/uL  Final   RBC 08/11/2022 4.55  3.87 - 5.11 Mil/uL Final   Hemoglobin 08/11/2022 13.3  12.0 - 15.0 g/dL Final   HCT 92/90/7975 39.7  36.0 - 46.0 % Final   MCV 08/11/2022 87.4  78.0 - 100.0 fl Final   MCHC 08/11/2022 33.4  30.0 - 36.0 g/dL Final   RDW 92/90/7975 13.9  11.5 - 15.5 % Final   Platelets 08/11/2022 203.0  150.0 - 400.0 K/uL Final   Neutrophils Relative % 08/11/2022 42.2 (L)  43.0 - 77.0 % Final   Lymphocytes Relative 08/11/2022 45.7  12.0 - 46.0 % Final   Monocytes Relative 08/11/2022 7.6  3.0 - 12.0 % Final   Eosinophils Relative 08/11/2022 3.1  0.0 - 5.0 % Final   Basophils Relative 08/11/2022 1.4  0.0 - 3.0 % Final   Neutro Abs 08/11/2022 1.5  1.4 - 7.7 K/uL Final   Lymphs Abs 08/11/2022 1.7  0.7 - 4.0 K/uL Final   Monocytes Absolute 08/11/2022 0.3  0.1 - 1.0 K/uL Final   Eosinophils Absolute 08/11/2022 0.1  0.0 - 0.7 K/uL Final   Basophils Absolute 08/11/2022 0.1  0.0 - 0.1 K/uL Final   Sodium 08/11/2022 140  135 - 145 mEq/L Final   Potassium 08/11/2022 3.9  3.5 - 5.1 mEq/L Final   Chloride 08/11/2022 103  96 - 112 mEq/L Final   CO2 08/11/2022 31  19 - 32 mEq/L Final   Glucose, Bld 08/11/2022 90  70 - 99 mg/dL Final   BUN 92/90/7975 9  6 - 23 mg/dL Final   Creatinine, Ser 08/11/2022 0.62  0.40 - 1.20 mg/dL Final   Total Bilirubin 08/11/2022 1.5 (H)  0.2 - 1.2 mg/dL Final   Alkaline Phosphatase 08/11/2022 49  39 - 117 U/L Final   AST 08/11/2022 15  0 - 37 U/L Final   ALT 08/11/2022 11  0 - 35 U/L Final   Total Protein 08/11/2022 6.6  6.0 - 8.3 g/dL Final   Albumin  08/11/2022 4.1  3.5 - 5.2 g/dL Final   GFR 92/90/7975 105.02  >60.00 mL/min Final   Calcium 08/11/2022 9.6  8.4 - 10.5 mg/dL Final   Hgb J8r MFr Bld 08/11/2022 6.5  4.6 - 6.5 % Final  There may be more visits with results that are not included.  No image results found. No results found.       ASSESSMENT & PLAN   Assessment & Plan Attention or concentration deficit ADHD symptoms are managed with  Adderall, providing a calmer and more focused state. No significant side effects reported, but medication is not taken consistently. Continue Adderall 5 mg as needed, not exceeding the prescribed dosage. Encourage consistent use of medication when focus is needed and advise taking it in the morning or 30-60 minutes before tasks requiring focus. Encourage the use of a planner to organize daily activities and improve focus. Inadequate material resources Financial constraints impact access to healthcare and medications. Refer to social work for assistance with financial resources, including medication costs, utilities, and food support. Pain in both hands A new referral is needed for a second opinion on hand surgery, as she prefers an alternative provider. Place a new referral for hand surgery for a second opinion, avoiding [facility]. Pruritic condition Intermittent pruritus, particularly around the navel area, with a possible fungal component. Prescribe nystatin  powder for potential fungal involvement. Type 2 diabetes mellitus with hyperglycemia, without long-term current use of insulin  (HCC) Type 2 diabetes mellitus with hyperglycemia   Diabetes is well-controlled with a significant improvement in A1c from 11.4 to 6.5 using semaglutide  (Ozempic ). Continue semaglutide  0.5 mg subcutaneously once a week and discontinue dapagliflozin  (Farxiga ). Consider using Dexcom for educational purposes to monitor glucose levels, sample G7 provided. Encourage dietary modifications to maintain glucose control. Onychomycosis Fungal nail infection is treated with topical ciclopirox . No liver effects are associated with the topical  form. Continue ciclopirox  cream as prescribed and prescribe ciclopirox  lacquer for improved adherence and effectiveness. Discontinue terbinafine  oral medication. Depression, unspecified depression type Depression is managed with escitalopram  (Lexapro ), but inconsistent use may affect mood  stabilization. Encourage consistent daily use of escitalopram  10 mg. Implement a structured daily routine including exercise, self-care, and planning. Consider referral to a psychologist for therapy if needed in the future. Encourage the use of [employer] for exploring new hobbies and interests. Leukopenia, unspecified type Low white blood cell count and iron  deficiency anemia (history, currently resolved)   Previously resolved post-hysterectomy. No current need for hematology referral unless counts decrease again. Monitor white blood cell count and hemoglobin levels periodically.   ORDER ASSOCIATIONS  #   DIAGNOSIS / CONDITION ICD-10 ENCOUNTER ORDER     ICD-10-CM   1. Attention or concentration deficit  R41.840 amphetamine -dextroamphetamine  (ADDERALL XR) 5 MG 24 hr capsule    AMB Referral [facility]    amphetamine -dextroamphetamine  (ADDERALL XR) 5 MG 24 hr capsule    amphetamine -dextroamphetamine  (ADDERALL XR) 5 MG 24 hr capsule    2. Inadequate material resources  Z59.87 AMB Referral [facility] Management    3. Pain in both hands  M79.641 Ambulatory referral to Hand Surgery   M79.642     4. Pruritic condition  L29.9 nystatin -triamcinolone  ointment (MYCOLOG)    5. Type 2 diabetes mellitus with hyperglycemia, without long-term current use of insulin  (HCC)  E11.65 Semaglutide ,0.25 or 0.5MG /DOS, (OZEMPIC , 0.25 OR 0.5 MG/DOSE,) 2 MG/3ML SOPN    6. Onychomycosis  B35.1 ciclopirox  (PENLAC ) 8 % solution    7. Depression, unspecified depression type  F32.A     8. Leukopenia, unspecified type  D72.819          Orders Placed in Encounter:   Referral Orders         Ambulatory referral to Hand Surgery         AMB Referral [facility]     Meds ordered this encounter  Medications   amphetamine -dextroamphetamine  (ADDERALL XR) 5 MG 24 hr capsule    Sig: Take 1 capsule (5 mg total) by mouth daily.    Dispense:  30 capsule    Refill:  0   amphetamine -dextroamphetamine   (ADDERALL XR) 5 MG 24 hr capsule    Sig: Take 1 capsule (5 mg total) by mouth daily.    Dispense:  30 capsule    Refill:  0   amphetamine -dextroamphetamine  (ADDERALL XR) 5 MG 24 hr capsule    Sig: Take 1 capsule (5 mg total) by mouth daily.    Dispense:  30 capsule    Refill:  0   nystatin -triamcinolone  ointment (MYCOLOG)    Sig: Apply 1 Application topically 2 (two) times daily.    Dispense:  30 g    Refill:  0   Semaglutide ,0.25 or 0.5MG /DOS, (OZEMPIC , 0.25 OR 0.5 MG/DOSE,) 2 MG/3ML SOPN    Sig: Inject 0.5 mg into the skin once a week.    Dispense:  3 mL    Refill:  2   ciclopirox  (PENLAC ) 8 % solution    Sig: Apply topically at bedtime. Apply over nail and surrounding skin. Apply daily over previous coat. After seven (7) days, may remove with alcohol and continue cycle.    Dispense:  6.6 mL    Refill:  0          This document was synthesized by artificial intelligence (Abridge) using HIPAA-compliant recording of the clinical interaction;   We discussed the use  of AI scribe software for clinical note transcription with the patient, who gave verbal consent to proceed. additional Info: This encounter employed state-of-the-art, real-time, collaborative documentation. The patient actively reviewed and assisted in updating their electronic medical record on a shared screen, ensuring transparency and facilitating joint problem-solving for the problem list, overview, and plan. This approach promotes accurate, informed care. The treatment plan was discussed and reviewed in detail, including medication safety, potential side effects, and all patient questions. We confirmed understanding and comfort with the plan. Follow-up instructions were established, including contacting the office for any concerns, returning if symptoms worsen, persist, or new symptoms develop, and precautions for potential emergency department visits.

## 2023-10-07 NOTE — Assessment & Plan Note (Signed)
 Low white blood cell count and iron  deficiency anemia (history, currently resolved)   Previously resolved post-hysterectomy. No current need for hematology referral unless counts decrease again. Monitor white blood cell count and hemoglobin levels periodically.

## 2023-10-07 NOTE — Assessment & Plan Note (Signed)
 Fungal nail infection is treated with topical ciclopirox . No liver effects are associated with the topical form. Continue ciclopirox  cream as prescribed and prescribe ciclopirox  lacquer for improved adherence and effectiveness. Discontinue terbinafine  oral medication.

## 2023-10-07 NOTE — Assessment & Plan Note (Signed)
 Type 2 diabetes mellitus with hyperglycemia   Diabetes is well-controlled with a significant improvement in A1c from 11.4 to 6.5 using semaglutide  (Ozempic ). Continue semaglutide  0.5 mg subcutaneously once a week and discontinue dapagliflozin  (Farxiga ). Consider using Dexcom for educational purposes to monitor glucose levels, sample G7 provided. Encourage dietary modifications to maintain glucose control.

## 2023-10-07 NOTE — Assessment & Plan Note (Signed)
 ADHD symptoms are managed with Adderall, providing a calmer and more focused state. No significant side effects reported, but medication is not taken consistently. Continue Adderall 5 mg as needed, not exceeding the prescribed dosage. Encourage consistent use of medication when focus is needed and advise taking it in the morning or 30-60 minutes before tasks requiring focus. Encourage the use of a planner to organize daily activities and improve focus.

## 2023-10-07 NOTE — Assessment & Plan Note (Signed)
 Intermittent pruritus, particularly around the navel area, with a possible fungal component. Prescribe nystatin  powder for potential fungal involvement.

## 2023-10-08 ENCOUNTER — Telehealth: Payer: Self-pay | Admitting: *Deleted

## 2023-10-08 NOTE — Progress Notes (Signed)
 Complex Care Management Note Care Guide Note  10/08/2023 Name: [name] MRN: [id number] DOB: January 12, 1976   Complex Care Management Outreach Attempts: An unsuccessful telephone outreach was attempted today to offer the patient information about available complex care management services.  Follow Up Plan:  Additional outreach attempts will be made to offer the patient complex care management information and services.   Encounter Outcome:  No Answer  Harlene Satterfield  [facility]  [facility], [facility] Guide  Direct Dial: [phone]  Fax [phone]

## 2023-10-08 NOTE — Progress Notes (Signed)
 Care Guide Pharmacy Note  10/08/2023 Name: [name] MRN: [id number] DOB: 04/26/1975  Referred By: Jesus Bernardino MATSU, MD Reason for referral: Complex Care Management (Initial outreach to schedule referral with PharmD and LCSW [facility] )   [name] is a 48 y.o. year old female who is a primary care patient of Jesus Bernardino MATSU, MD.  [name] was referred to the pharmacist for assistance related to: DMII  An unsuccessful telephone outreach was attempted today to contact the patient who was referred to the pharmacy team for assistance with medication management. Additional attempts will be made to contact the patient.  Harlene Satterfield  [facility]  [facility], [facility] Guide  Direct Dial: [phone]  Fax [phone]

## 2023-10-11 NOTE — Progress Notes (Signed)
 CareAction Taken: Care Guide has routed the chart to the clinician for referral outreach.  Clinician Responsibility: Clinician to take over and complete referral process.  [name]  [facility]  [facility], [facility] Guide  Direct Dial: [phone]  Fax [phone]

## 2023-10-12 ENCOUNTER — Encounter: Payer: Self-pay | Admitting: Obstetrics and Gynecology

## 2023-10-15 ENCOUNTER — Encounter: Payer: Self-pay | Admitting: Internal Medicine

## 2023-10-15 ENCOUNTER — Ambulatory Visit (INDEPENDENT_AMBULATORY_CARE_PROVIDER_SITE_OTHER): Admitting: Internal Medicine

## 2023-10-15 VITALS — BP 100/64 | HR 82 | Temp 97.2°F | Ht 64.0 in | Wt 148.0 lb

## 2023-10-15 DIAGNOSIS — J3489 Other specified disorders of nose and nasal sinuses: Secondary | ICD-10-CM

## 2023-10-15 DIAGNOSIS — L910 Hypertrophic scar: Secondary | ICD-10-CM

## 2023-10-15 DIAGNOSIS — J324 Chronic pansinusitis: Secondary | ICD-10-CM

## 2023-10-15 LAB — POC COVID19 BINAXNOW: SARS Coronavirus 2 Ag: NEGATIVE

## 2023-10-15 MED ORDER — SIMPLY SALINE 0.9 % NA AERS
2.0000 | INHALATION_SPRAY | NASAL | 11 refills | Status: AC
  Filled 2023-10-15: qty 127, fill #0

## 2023-10-15 MED ORDER — AMOXICILLIN-POT CLAVULANATE 875-125 MG PO TABS
1.0000 | ORAL_TABLET | Freq: Two times a day (BID) | ORAL | 0 refills | Status: DC
  Filled 2023-10-15: qty 20, 10d supply, fill #0

## 2023-10-15 MED ORDER — PSEUDOEPHEDRINE HCL ER 120 MG PO TB12
120.0000 mg | ORAL_TABLET | Freq: Two times a day (BID) | ORAL | 0 refills | Status: AC
  Filled 2023-10-15: qty 20, 10d supply, fill #0

## 2023-10-15 MED ORDER — LORATADINE 10 MG PO TABS
10.0000 mg | ORAL_TABLET | Freq: Every day | ORAL | 11 refills | Status: AC
  Filled 2023-10-15: qty 30, 30d supply, fill #0
  Filled 2023-11-12: qty 30, 30d supply, fill #1

## 2023-10-15 MED ORDER — FLUTICASONE PROPIONATE 50 MCG/ACT NA SUSP
2.0000 | Freq: Every day | NASAL | 6 refills | Status: AC
  Filled 2023-10-15: qty 16, 30d supply, fill #0
  Filled 2023-11-12: qty 16, 30d supply, fill #1
  Filled 2024-02-17: qty 16, 30d supply, fill #2

## 2023-10-15 NOTE — Progress Notes (Signed)
 ==============================  [facility]: [phone]   -- Medical Office Visit --  Patient: [name]      Age: 48 y.o.       Sex:  female  Date:   10/15/2023 Today's Healthcare Provider: Bernardino KANDICE Cone, MD  ==============================   Chief Complaint: Ear Pain (Pt has had some ear pain off and on for while now) and Allergic Rhinitis  (Pt states having some nasal congestion for two weeks now )   Discussed the use of AI scribe software for clinical note transcription with the patient, who gave verbal consent to proceed.  History of Present Illness 48 year old female with recurrent sinus and ear infections who presents with several weeks of sinus congestion and ear pain.  She has been experiencing sinus congestion and ear pain for several weeks, primarily affecting her ears and sinuses. Symptoms include significant ear pain and pressure, with some days showing slight improvement. She frequently sneezes and has a sore throat and stuffy voice, which she attributes to allergies. Ear pressure and headaches, particularly when leaning forward, are also present, which she associates with sinus pressure.  She has a history of recurrent sinus infections and poor sinus drainage, which she believes is exacerbated by her allergies. Her sinus infections often lead to ear infections due to poor drainage, resulting in significant pressure and discomfort in her ears and sinuses.  She has used nasal sprays and allergy medications in the past, but her symptoms persist. Currently, she is experiencing sinus pressure and headaches, which she attributes to her chronic allergies.  No significant coughing.  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 Past Medical History:  has a past medical history of Anemia, Asthma, Blood transfusion without reported diagnosis, Diverticulosis, Gestational diabetes, Postoperative anemia due to acute blood loss (10/09/2021), and Type 2 diabetes mellitus (HCC). Past Surgical History:   has a past surgical history that includes arm surgery; Dilation and curettage of uterus; Cesarean section; Tooth extraction; Cervical cerclage; IR Radiologist Eval & Mgmt (09/04/2021); and Hysterectomy abdominal with salpingectomy (Bilateral, 10/08/2021). Social History:   reports that she has never smoked. She has never used smokeless tobacco. She reports that she does not drink alcohol and does not use drugs. Family History:  family history includes Breast cancer in her maternal aunt; Hypertension in her brother, maternal uncle, and mother. Allergies:  is allergic to shellfish allergy, iodinated contrast media, other, and latex.   Medication Reconciliation: Current Outpatient Medications on File Prior to Visit  Medication Sig   Accu-Chek Softclix Lancets lancets Use as instructed   albuterol  (VENTOLIN  HFA) 108 (90 Base) MCG/ACT inhaler Inhale 2 puffs into the lungs every 6 (six) hours as needed for wheezing or shortness of breath.   amphetamine -dextroamphetamine  (ADDERALL XR) 5 MG 24 hr capsule Take 1 capsule (5 mg total) by mouth daily.   amphetamine -dextroamphetamine  (ADDERALL XR) 5 MG 24 hr capsule Take 1 capsule (5 mg total) by mouth daily.   [START ON 11/01/2023]  amphetamine -dextroamphetamine  (ADDERALL XR) 5 MG 24 hr capsule Take 1 capsule (5 mg total) by mouth daily.   [START ON 11/06/2023] amphetamine -dextroamphetamine  (ADDERALL XR) 5 MG 24 hr capsule Take 1 capsule (5 mg total) by mouth daily.   [START ON 12/06/2023] amphetamine -dextroamphetamine  (ADDERALL XR) 5 MG 24 hr capsule Take 1 capsule (5 mg total) by mouth daily.   Blood Glucose Monitoring Suppl (ACCU-CHEK GUIDE) w/Device KIT Use to test blood sugar daily   Calcium Carb-Cholecalciferol  (CALCIUM + D3 PO) Take 1 tablet by mouth daily. When able to remember   Cholecalciferol  (VITAMIN D3) 125 MCG (5000 UT) CAPS Take 1 capsule (5,000 Units total) by mouth daily. (Patient not taking: Reported on 09/24/2023)   ciclopirox  (LOPROX ) 0.77 % cream Apply 1 Application topically 2 (two) times daily.   ciclopirox  (PENLAC ) 8 % solution Apply topically at bedtime. Apply over nail and surrounding skin. Apply daily over previous coat. After seven (7) days, may remove with alcohol and continue cycle.   clindamycin  (CLINDAGEL ) 1 % gel Apply topically 2 (two) times daily.   Cyanocobalamin  (B-12) 1000 MCG TBCR Take 1,000 mcg by mouth daily. When able to remember   dapagliflozin  propanediol (FARXIGA ) 5 MG TABS tablet Take 1 tablet (5 mg total) by mouth daily before breakfast.   escitalopram  (LEXAPRO ) 10 MG tablet Take 1 tablet (10 mg total) by mouth daily. Take half tablet for first 2 weeks to start.   famotidine  (PEPCID ) 20 MG tablet Take 1 tablet (20 mg total) by mouth 2 (two) times daily.   fluticasone  (FLONASE ) 50 MCG/ACT nasal spray Place 1 spray into both nostrils in the morning and at bedtime.   glucose blood (ACCU-CHEK GUIDE TEST) test strip Use to test blood sugar daily   glucose blood (ACCU-CHEK GUIDE) test strip daily   glucose blood test strip Use as instructed   Lancets (FREESTYLE) lancets Use as instructed (Patient not taking: Reported on 09/24/2023)   levocetirizine (XYZAL ) 5 MG tablet Take 1 tablet (5 mg  total) by mouth every evening.   nystatin -triamcinolone  ointment (MYCOLOG) Apply 1 Application topically 2 (two) times daily.   ondansetron  (ZOFRAN -ODT) 4 MG disintegrating tablet Dissolve 1 tablet under the tongue every 8 (eight) hours as needed for nausea or vomiting.   ondansetron  (ZOFRAN -ODT) 4 MG disintegrating tablet Dissolve 1 tablet under the tongue every 6 (six) hours as needed for nausea.   Saline (SIMPLY SALINE) 0.9 % AERS Place 1 spray into the nose daily at 6 (six) AM.   Semaglutide ,0.25 or 0.5MG /DOS, (OZEMPIC , 0.25 OR 0.5 MG/DOSE,) 2 MG/3ML SOPN Inject 0.5 mg into the skin once a week.   terbinafine  (LAMISIL ) 250 MG tablet Take 1 tablet (250 mg total) by mouth daily. For toenail fungal infection   tretinoin  (RETIN-A ) 0.025 % cream Apply topically at bedtime.   triamcinolone  cream (KENALOG ) 0.1 % Apply 1 Application topically 2 (two) times daily.   Vitamin D , Ergocalciferol , (DRISDOL ) 1.25 MG (50000 UNIT) CAPS capsule Take 1 capsule (50,000 Units total) by mouth every 7 (seven) days.   No current facility-administered medications on file prior to visit.  There are no discontinued medications.   Physical Exam:    10/15/2023   11:32 AM 10/07/2023    9:40 AM 09/24/2023   10:37 AM  Vitals with BMI  Height 5' 4 5' 4.17 5' 4.173  Weight 148 lbs 147 lbs 13 oz 146 lbs 10 oz  BMI 25.39 25.23 25.03  Systolic 100 100 889  Diastolic 64 60 70  Pulse 82 74  74  Vital signs reviewed.  Nursing notes reviewed. Weight trend reviewed. Physical Activity: Not on file   General Appearance:  No acute distress appreciable.   Well-groomed, healthy-appearing female.  Well proportioned with no abnormal fat distribution.  Good muscle tone. Pulmonary:  Normal work of breathing at rest, no respiratory distress apparent. SpO2: 98 %  Musculoskeletal: All extremities are intact.  Neurological:  Awake, alert, oriented, and engaged.  No obvious focal neurological deficits or cognitive impairments.   Sensorium seems unclouded.   Speech is clear and coherent with logical content. Psychiatric:  Appropriate mood, pleasant and cooperative demeanor, thoughtful and engaged during the exam   Verbalized to patient: Physical Exam HEENT: Ear with scarring, difficult visualization of structures, back pressure, and bulging. Left ear worse than right. Sinus pressure and tenderness.   Results:   Verbalized to patient: Results LABS COVID-19 test: negative     10/07/2023   10:31 AM 09/24/2023   10:46 AM 06/09/2023   12:06 PM 05/10/2023    1:47 PM  PHQ 2/9 Scores  PHQ - 2 Score 4 1 3 6   PHQ- 9 Score 21  18 24     Office Visit on 09/24/2023  Component Date Value Ref Range Status   Color, Urine 09/24/2023 YELLOW  YELLOW Final   APPearance 09/24/2023 CLEAR  CLEAR Final   Specific Gravity, Urine 09/24/2023 1.025  1.001 - 1.035 Final   pH 09/24/2023 5.5  5.0 - 8.0 Final   Glucose, UA 09/24/2023 3+ (A)  NEGATIVE Final   Bilirubin Urine 09/24/2023 NEGATIVE  NEGATIVE Final   Ketones, ur 09/24/2023 NEGATIVE  NEGATIVE Final   Hgb urine dipstick 09/24/2023 NEGATIVE  NEGATIVE Final   Protein, ur 09/24/2023 NEGATIVE  NEGATIVE Final   Nitrite 09/24/2023 NEGATIVE  NEGATIVE Final   Leukocytes,Ua 09/24/2023 NEGATIVE  NEGATIVE Final   MICRO NUMBER: 09/24/2023 [id number]   Final   SPECIMEN QUALITY: 09/24/2023 Adequate   Final   Sample Source 09/24/2023 URINE   Final   STATUS: 09/24/2023 FINAL   Final   Result: 09/24/2023 No Growth   Final  Lab on 08/24/2023  Component Date Value Ref Range Status   Cholesterol 08/24/2023 159  0 - 200 mg/dL Final   Triglycerides 92/77/7974 62.0  0.0 - 149.0 mg/dL Final   HDL 92/77/7974 46.10  >39.00 mg/dL Final   VLDL 92/77/7974 12.4  0.0 - 40.0 mg/dL Final   LDL Cholesterol 08/24/2023 100 (H)  0 - 99 mg/dL Final   Total CHOL/HDL Ratio 08/24/2023 3   Final   NonHDL 08/24/2023 112.55   Final   Sodium 08/24/2023 138  135 - 145 mEq/L Final   Potassium 08/24/2023 3.7  3.5 -  5.1 mEq/L Final   Chloride 08/24/2023 103  96 - 112 mEq/L Final   CO2 08/24/2023 29  19 - 32 mEq/L Final   Glucose, Bld 08/24/2023 83  70 - 99 mg/dL Final   BUN 92/77/7974 14  6 - 23 mg/dL Final   Creatinine, Ser 08/24/2023 0.69  0.40 - 1.20 mg/dL Final   Total Bilirubin 08/24/2023 1.6 (H)  0.2 - 1.2 mg/dL Final   Alkaline Phosphatase 08/24/2023 40  39 - 117 U/L Final   AST 08/24/2023 15  0 - 37 U/L Final   ALT 08/24/2023 8  0 - 35 U/L Final   Total Protein 08/24/2023 7.3  6.0 - 8.3 g/dL Final   Albumin  08/24/2023 4.4  3.5 - 5.2 g/dL Final   GFR 92/77/7974 101.60  >60.00 mL/min Final  Calcium 08/24/2023 9.2  8.4 - 10.5 mg/dL Final   Microalb, Ur 92/77/7974 0.7  0.0 - 1.9 mg/dL Final   Creatinine,U 92/77/7974 142.9  mg/dL Final   Microalb Creat Ratio 08/24/2023 4.9  0.0 - 30.0 mg/g Final   WBC 08/24/2023 4.2  4.0 - 10.5 K/uL Final   RBC 08/24/2023 4.90  3.87 - 5.11 Mil/uL Final   Hemoglobin 08/24/2023 14.2  12.0 - 15.0 g/dL Final   HCT 92/77/7974 42.1  36.0 - 46.0 % Final   MCV 08/24/2023 85.8  78.0 - 100.0 fl Final   MCHC 08/24/2023 33.7  30.0 - 36.0 g/dL Final   RDW 92/77/7974 13.5  11.5 - 15.5 % Final   Platelets 08/24/2023 208.0  150.0 - 400.0 K/uL Final   Neutrophils Relative % 08/24/2023 37.3 (L)  43.0 - 77.0 % Final   Lymphocytes Relative 08/24/2023 45.9  12.0 - 46.0 % Final   Monocytes Relative 08/24/2023 8.0  3.0 - 12.0 % Final   Eosinophils Relative 08/24/2023 6.7 (H)  0.0 - 5.0 % Final   Basophils Relative 08/24/2023 2.1  0.0 - 3.0 % Final   Neutro Abs 08/24/2023 1.6  1.4 - 7.7 K/uL Final   Lymphs Abs 08/24/2023 1.9  0.7 - 4.0 K/uL Final   Monocytes Absolute 08/24/2023 0.3  0.1 - 1.0 K/uL Final   Eosinophils Absolute 08/24/2023 0.3  0.0 - 0.7 K/uL Final   Basophils Absolute 08/24/2023 0.1  0.0 - 0.1 K/uL Final  Orders Only on 08/11/2023  Component Date Value Ref Range Status   Hemoglobin A1C 08/11/2023 6.5 (A)  4.0 - 5.6 % Final  Office Visit on 06/09/2023  Component  Date Value Ref Range Status   Color, UA 06/09/2023 yellow   Final   Clarity, UA 06/09/2023 clear   Final   Glucose, UA 06/09/2023 Positive (A)  Negative Final   Bilirubin, UA 06/09/2023 negative   Final   Ketones, UA 06/09/2023 +-5   Final   Spec Grav, UA 06/09/2023 1.025  1.010 - 1.025 Final   Blood, UA 06/09/2023 Negative   Final   pH, UA 06/09/2023 5.0  5.0 - 8.0 Final   Protein, UA 06/09/2023 Negative  Negative Final   Urobilinogen, UA 06/09/2023 0.2  0.2 or 1.0 E.U./dL Final   Nitrite, UA 94/92/7974 negative   Final   Leukocytes, UA 06/09/2023 Negative  Negative Final   Odor 06/09/2023 negative   Final  Office Visit on 05/10/2023  Component Date Value Ref Range Status   WBC 05/10/2023 4.4  4.0 - 10.5 K/uL Final   RBC 05/10/2023 5.12 (H)  3.87 - 5.11 Mil/uL Final   Hemoglobin 05/10/2023 15.1 (H)  12.0 - 15.0 g/dL Final   HCT 95/92/7974 44.2  36.0 - 46.0 % Final   MCV 05/10/2023 86.3  78.0 - 100.0 fl Final   MCHC 05/10/2023 34.2  30.0 - 36.0 g/dL Final   RDW 95/92/7974 13.6  11.5 - 15.5 % Final   Platelets 05/10/2023 205.0  150.0 - 400.0 K/uL Final   Neutrophils Relative % 05/10/2023 38.6 (L)  43.0 - 77.0 % Final   Lymphocytes Relative 05/10/2023 44.6  12.0 - 46.0 % Final   Monocytes Relative 05/10/2023 7.3  3.0 - 12.0 % Final   Eosinophils Relative 05/10/2023 8.0 (H)  0.0 - 5.0 % Final   Basophils Relative 05/10/2023 1.5  0.0 - 3.0 % Final   Neutro Abs 05/10/2023 1.7  1.4 - 7.7 K/uL Final   Lymphs Abs 05/10/2023 2.0  0.7 - 4.0 K/uL Final   Monocytes Absolute 05/10/2023 0.3  0.1 - 1.0 K/uL Final   Eosinophils Absolute 05/10/2023 0.3  0.0 - 0.7 K/uL Final   Basophils Absolute 05/10/2023 0.1  0.0 - 0.1 K/uL Final   Sodium 05/10/2023 138  135 - 145 mEq/L Final   Potassium 05/10/2023 4.3  3.5 - 5.1 mEq/L Final   Chloride 05/10/2023 100  96 - 112 mEq/L Final   CO2 05/10/2023 29  19 - 32 mEq/L Final   Glucose, Bld 05/10/2023 347 (H)  70 - 99 mg/dL Final   BUN 95/92/7974 15  6 - 23  mg/dL Final   Creatinine, Ser 05/10/2023 0.77  0.40 - 1.20 mg/dL Final   Total Bilirubin 05/10/2023 1.3 (H)  0.2 - 1.2 mg/dL Final   Alkaline Phosphatase 05/10/2023 61  39 - 117 U/L Final   AST 05/10/2023 12  0 - 37 U/L Final   ALT 05/10/2023 11  0 - 35 U/L Final   Total Protein 05/10/2023 7.5  6.0 - 8.3 g/dL Final   Albumin  05/10/2023 4.5  3.5 - 5.2 g/dL Final   GFR 95/92/7974 90.49  >60.00 mL/min Final   Calcium 05/10/2023 9.7  8.4 - 10.5 mg/dL Final   Cholesterol 95/92/7974 157  0 - 200 mg/dL Final   Triglycerides 95/92/7974 85.0  0.0 - 149.0 mg/dL Final   HDL 95/92/7974 47.60  >39.00 mg/dL Final   VLDL 95/92/7974 17.0  0.0 - 40.0 mg/dL Final   LDL Cholesterol 05/10/2023 93  0 - 99 mg/dL Final   Total CHOL/HDL Ratio 05/10/2023 3   Final   NonHDL 05/10/2023 109.51   Final   Hgb A1c MFr Bld 05/10/2023 11.4 (H)  4.6 - 6.5 % Final   VITD 05/10/2023 29.53 (L)  30.00 - 100.00 ng/mL Final   Vitamin B-12 05/10/2023 >1537 (H)  211 - 911 pg/mL Final   Folate 05/10/2023 >25.2  >5.9 ng/mL Final   Microalb, Ur 05/10/2023 1.0  0.0 - 1.9 mg/dL Final   Creatinine,U 95/92/7974 111.8  mg/dL Final   Microalb Creat Ratio 05/10/2023 9.1  0.0 - 30.0 mg/g Final   Color, Urine 05/10/2023 YELLOW  YELLOW Final   APPearance 05/10/2023 CLEAR  CLEAR Final   Specific Gravity, Urine 05/10/2023 1.039 (H)  1.001 - 1.035 Final   pH 05/10/2023 < OR = 5.0  5.0 - 8.0 Final   Glucose, UA 05/10/2023 3+ (A)  NEGATIVE Final   Bilirubin Urine 05/10/2023 NEGATIVE  NEGATIVE Final   Ketones, ur 05/10/2023 3+ (A)  NEGATIVE Final   Hgb urine dipstick 05/10/2023 NEGATIVE  NEGATIVE Final   Protein, ur 05/10/2023 NEGATIVE  NEGATIVE Final   Nitrites, Initial 05/10/2023 NEGATIVE  NEGATIVE Final   Leukocyte Esterase 05/10/2023 NEGATIVE  NEGATIVE Final   WBC, UA 05/10/2023 0-5  0 - 5 /HPF Final   RBC / HPF 05/10/2023 NONE SEEN  0 - 2 /HPF Final   Squamous Epithelial / HPF 05/10/2023 0-5  < OR = 5 /HPF Final   Bacteria, UA  05/10/2023 NONE SEEN  NONE SEEN /HPF Final   Hyaline Cast 05/10/2023 NONE SEEN  NONE SEEN /LPF Final   Note 05/10/2023    Final   Reflexve Urine Culture 05/10/2023    Final  Office Visit on 03/17/2023  Component Date Value Ref Range Status   Microalb, Ur 03/17/2023 <0.7  0.0 - 1.9 mg/dL Final   Creatinine,U 97/87/7974 97.5  mg/dL Final   Microalb Creat Ratio 03/17/2023 7.2  0.0 -  30.0 mg/g Final  Office Visit on 02/01/2023  Component Date Value Ref Range Status   SARS Coronavirus 2 Ag 02/01/2023 Negative  Negative Final   Rapid Strep A Screen 02/01/2023 Positive (A)  Negative Final   Influenza A, POC 02/01/2023 Positive (A)  Negative Final   Influenza B, POC 02/01/2023 Negative  Negative Final  Office Visit on 01/26/2023  Component Date Value Ref Range Status   WBC 01/26/2023 3.7 (L)  4.0 - 10.5 K/uL Final   RBC 01/26/2023 4.59  3.87 - 5.11 Mil/uL Final   Hemoglobin 01/26/2023 13.6  12.0 - 15.0 g/dL Final   HCT 87/75/7975 40.0  36.0 - 46.0 % Final   MCV 01/26/2023 87.3  78.0 - 100.0 fl Final   MCHC 01/26/2023 34.0  30.0 - 36.0 g/dL Final   RDW 87/75/7975 13.5  11.5 - 15.5 % Final   Platelets 01/26/2023 186.0  150.0 - 400.0 K/uL Final   Neutrophils Relative % 01/26/2023 44.6  43.0 - 77.0 % Final   Lymphocytes Relative 01/26/2023 41.9  12.0 - 46.0 % Final   Monocytes Relative 01/26/2023 7.6  3.0 - 12.0 % Final   Eosinophils Relative 01/26/2023 4.5  0.0 - 5.0 % Final   Basophils Relative 01/26/2023 1.4  0.0 - 3.0 % Final   Neutro Abs 01/26/2023 1.7  1.4 - 7.7 K/uL Final   Lymphs Abs 01/26/2023 1.6  0.7 - 4.0 K/uL Final   Monocytes Absolute 01/26/2023 0.3  0.1 - 1.0 K/uL Final   Eosinophils Absolute 01/26/2023 0.2  0.0 - 0.7 K/uL Final   Basophils Absolute 01/26/2023 0.1  0.0 - 0.1 K/uL Final   Sodium 01/26/2023 139  135 - 145 mEq/L Final   Potassium 01/26/2023 3.6  3.5 - 5.1 mEq/L Final   Chloride 01/26/2023 104  96 - 112 mEq/L Final   CO2 01/26/2023 29  19 - 32 mEq/L Final    Glucose, Bld 01/26/2023 123 (H)  70 - 99 mg/dL Final   BUN 87/75/7975 14  6 - 23 mg/dL Final   Creatinine, Ser 01/26/2023 0.62  0.40 - 1.20 mg/dL Final   Total Bilirubin 01/26/2023 1.2  0.2 - 1.2 mg/dL Final   Alkaline Phosphatase 01/26/2023 46  39 - 117 U/L Final   AST 01/26/2023 16  0 - 37 U/L Final   ALT 01/26/2023 10  0 - 35 U/L Final   Total Protein 01/26/2023 6.8  6.0 - 8.3 g/dL Final   Albumin  01/26/2023 4.1  3.5 - 5.2 g/dL Final   GFR 87/75/7975 104.68  >60.00 mL/min Final   Calcium 01/26/2023 8.9  8.4 - 10.5 mg/dL Final   Hgb J8r MFr Bld 01/26/2023 7.0 (H)  4.6 - 6.5 % Final   Cholesterol 01/26/2023 157  0 - 200 mg/dL Final   Triglycerides 87/75/7975 70.0  0.0 - 149.0 mg/dL Final   HDL 87/75/7975 44.90  >39.00 mg/dL Final   VLDL 87/75/7975 14.0  0.0 - 40.0 mg/dL Final   LDL Cholesterol 01/26/2023 98  0 - 99 mg/dL Final   Total CHOL/HDL Ratio 01/26/2023 3   Final   NonHDL 01/26/2023 111.77   Final   Amphetamines, Urine 01/26/2023 Negative  Cutoff=1000 ng/mL Final   Barbiturate Quant, Ur 01/26/2023 Negative  Cutoff=300 ng/mL Final   Benzodiazepine Quant, Ur 01/26/2023 Negative  Cutoff=300 ng/mL Final   Cannabinoid Quant, Ur 01/26/2023 Negative  Cutoff=50 ng/mL Final   Cocaine (Metab.) 01/26/2023 Negative  Cutoff=300 ng/mL Final   Opiate Quant, Ur 01/26/2023 Negative  Cutoff=300  ng/mL Final   PCP Quant, Ur 01/26/2023 Negative  Cutoff=25 ng/mL Final   Methadone Screen, Urine 01/26/2023 Negative  Cutoff=300 ng/mL Final   Propoxyphene 01/26/2023 Negative  Cutoff=300 ng/mL Final   Ethanol, Urine 01/26/2023 Negative  Cutoff=0.020 % Final  Lab on 11/11/2022  Component Date Value Ref Range Status   QuantiFERON-TB Gold Plus 11/11/2022 NEGATIVE  NEGATIVE Final   NIL 11/11/2022 0.03  IU/mL Final   Mitogen-NIL 11/11/2022 >10.00  IU/mL Final   TB1-NIL 11/11/2022 0.01  IU/mL Final   TB2-NIL 11/11/2022 0.00  IU/mL Final  Office Visit on 08/11/2022  Component Date Value Ref Range Status    WBC 08/11/2022 3.7 (L)  4.0 - 10.5 K/uL Final   RBC 08/11/2022 4.55  3.87 - 5.11 Mil/uL Final   Hemoglobin 08/11/2022 13.3  12.0 - 15.0 g/dL Final   HCT 92/90/7975 39.7  36.0 - 46.0 % Final   MCV 08/11/2022 87.4  78.0 - 100.0 fl Final   MCHC 08/11/2022 33.4  30.0 - 36.0 g/dL Final   RDW 92/90/7975 13.9  11.5 - 15.5 % Final   Platelets 08/11/2022 203.0  150.0 - 400.0 K/uL Final   Neutrophils Relative % 08/11/2022 42.2 (L)  43.0 - 77.0 % Final   Lymphocytes Relative 08/11/2022 45.7  12.0 - 46.0 % Final   Monocytes Relative 08/11/2022 7.6  3.0 - 12.0 % Final   Eosinophils Relative 08/11/2022 3.1  0.0 - 5.0 % Final   Basophils Relative 08/11/2022 1.4  0.0 - 3.0 % Final   Neutro Abs 08/11/2022 1.5  1.4 - 7.7 K/uL Final   Lymphs Abs 08/11/2022 1.7  0.7 - 4.0 K/uL Final   Monocytes Absolute 08/11/2022 0.3  0.1 - 1.0 K/uL Final   Eosinophils Absolute 08/11/2022 0.1  0.0 - 0.7 K/uL Final   Basophils Absolute 08/11/2022 0.1  0.0 - 0.1 K/uL Final   Sodium 08/11/2022 140  135 - 145 mEq/L Final   Potassium 08/11/2022 3.9  3.5 - 5.1 mEq/L Final   Chloride 08/11/2022 103  96 - 112 mEq/L Final   CO2 08/11/2022 31  19 - 32 mEq/L Final   Glucose, Bld 08/11/2022 90  70 - 99 mg/dL Final   BUN 92/90/7975 9  6 - 23 mg/dL Final   Creatinine, Ser 08/11/2022 0.62  0.40 - 1.20 mg/dL Final   Total Bilirubin 08/11/2022 1.5 (H)  0.2 - 1.2 mg/dL Final   Alkaline Phosphatase 08/11/2022 49  39 - 117 U/L Final   AST 08/11/2022 15  0 - 37 U/L Final   ALT 08/11/2022 11  0 - 35 U/L Final   Total Protein 08/11/2022 6.6  6.0 - 8.3 g/dL Final   Albumin  08/11/2022 4.1  3.5 - 5.2 g/dL Final   GFR 92/90/7975 105.02  >60.00 mL/min Final   Calcium 08/11/2022 9.6  8.4 - 10.5 mg/dL Final   Hgb J8r MFr Bld 08/11/2022 6.5  4.6 - 6.5 % Final  There may be more visits with results that are not included.  No image results found. No results found.       ASSESSMENT & PLAN   Assessment & Plan Nasal and sinus  discharge Chronic pansinusitis Chronic pansinusitis with recurrent bacterial infection and eustachian tube dysfunction   Chronic pansinusitis with recurrent bacterial infections is causing eustachian tube dysfunction, presenting as sinus congestion, ear pressure, and headaches. This condition is likely due to poor sinus drainage and anatomical predisposition with narrow drainage tracts. The current episode is suspected to be bacterial, exacerbated  by allergies. Prescribe Augmentin  for the bacterial infection. Instruct on nasal irrigation to thin mucus and facilitate drainage. Advise using Flonase  nasal spray to reduce inflammation and improve drainage. Recommend taking an allergy pill and Sudafed to manage symptoms and reduce swelling. Discuss potential surgical intervention with a coblation device to improve sinus drainage if recurrent infections persist.  Allergic rhinitis contributing to chronic sinus and ear disease   Allergic rhinitis is contributing to chronic sinus and ear issues by causing swelling that obstructs sinus drainage, leading to infections. Managing allergies is crucial to prevent recurrent infections. Continue Flonase  nasal spray to manage allergic symptoms. Take an allergy pill regularly to control symptoms. Use saline nasal spray to maintain nasal moisture and assist in clearing allergens.  Hypertrophic scar  Painful growths (possible keloids) on both ears   Painful growths on both ears, possibly keloids, were discussed. Potential treatment options include radiation therapy, which is generally painless but costly. Consider referral to radiation oncology for evaluation and potential treatment of ear growths if financially feasible.  ORDER ASSOCIATIONS  #   DIAGNOSIS / CONDITION ICD-10 ENCOUNTER ORDER     ICD-10-CM   1. Nasal and sinus discharge  J34.89 POC COVID-19 [facility]axNow    2. Chronic pansinusitis  J32.4     3. 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 Also painful growth on both ears where she had piercing (hypertrophic scar vs keloid).          This document was synthesized by artificial intelligence ([employer]) using HIPAA-compliant recording of the clinical interaction;   We discussed the use of AI scribe software for clinical note transcription with the patient, who gave verbal consent to proceed. additional Info: This encounter employed state-of-the-art, real-time, collaborative documentation. The patient actively reviewed and assisted in updating their electronic medical record on a shared screen, ensuring transparency and facilitating joint problem-solving for the problem list, overview, and plan. This approach promotes accurate, informed care. The treatment plan was discussed and reviewed in detail, including medication safety, potential side effects, and all patient questions. We confirmed understanding and comfort with the plan. Follow-up instructions were established, including contacting the office for any concerns, returning if symptoms worsen, persist, or new symptoms develop, and precautions for potential emergency department visits.

## 2023-10-15 NOTE — Patient Instructions (Addendum)
 It was a pleasure seeing you today! Your health and satisfaction are our top priorities.  Maria Cone, MD  VISIT SUMMARY: Today, we discussed your ongoing issues with sinus congestion and ear pain, which have been persistent for several weeks. We reviewed your history of recurrent sinus and ear infections, likely exacerbated by allergies. We also examined the painful growths on your ears.  YOUR PLAN: -CHRONIC PANSINUSITIS WITH RECURRENT BACTERIAL INFECTION AND EUSTACHIAN TUBE DYSFUNCTION: Chronic pansinusitis is a long-term inflammation of all the sinuses, often leading to bacterial infections and problems with the eustachian tube, which connects the middle ear to the throat. This condition is causing your sinus congestion, ear pressure, and headaches. We will treat the bacterial infection with Augmentin . You should also perform nasal irrigation to help thin the mucus and improve drainage. Use Flonase  nasal spray to reduce inflammation and take an allergy pill and Sudafed to manage your symptoms and reduce swelling. If infections continue, we may consider a surgical procedure to improve sinus drainage.  -ALLERGIC RHINITIS CONTRIBUTING TO CHRONIC SINUS AND EAR DISEASE: Allergic rhinitis is an allergic reaction that causes sneezing, congestion, and a runny nose, which can lead to sinus and ear infections by blocking sinus drainage. To manage your allergies and prevent infections, continue using Flonase  nasal spray and take an allergy pill regularly. Use saline nasal spray to keep your nasal passages moist and help clear allergens.  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 We discussed potential treatment options, including radiation therapy, which is generally painless but can be expensive. We may refer you to a radiation oncologist for further evaluation and possible treatment if it is financially feasible.  INSTRUCTIONS: Please  start taking Augmentin  as prescribed. Continue using Flonase  nasal spray, an allergy pill, and Sudafed to manage your symptoms. Perform nasal irrigation regularly. If your symptoms do not improve or if you experience recurrent infections, we may need to discuss surgical options. Additionally, consider the referral to a radiation oncologist for the evaluation of the painful growths on your ears.  Your Providers PCP: Maria Maria MATSU, MD,  [phone]) Referring Provider: [name] MATSU, MD,  [phone])  NEXT STEPS: [x]  Early Intervention: Schedule sooner appointment, call our on-call services, or go to emergency room if there is any significant Increase in pain or discomfort New or worsening symptoms Sudden or severe changes in your health [x]  Flexible Follow-Up: We recommend a No follow-ups on file. for optimal routine care. This allows for progress monitoring and treatment adjustments. [x]  Preventive Care: Schedule your annual preventive care visit! It's typically covered by insurance and helps identify potential health issues early. [x]  Lab & X-ray Appointments: Incomplete tests scheduled today, or call to schedule. X-rays: [facility] (M-F, 8:30am-noon or 1pm-5pm). [x]  Medical Information Release: Sign a release form at front desk to obtain relevant medical information we don't have.  MAKING THE MOST OF OUR FOCUSED 20 MINUTE APPOINTMENTS: [x]   Clearly state your top concerns at the beginning of the visit to focus our discussion [x]   If you anticipate you will need more time, please inform the front desk during scheduling - we can book multiple appointments in the same week. [x]   If you have transportation problems- use our convenient video appointments or ask about transportation support. [x]   We can get down to business faster if you use MyChart to update information before the visit and submit non-urgent questions before your visit. Thank you  for taking the time to  provide details through MyChart.  Let our nurse know and she can import this information into your encounter documents.  Arrival and Wait Times: [x]   Arriving on time ensures that everyone receives prompt attention. [x]   Early morning (8a) and afternoon (1p) appointments tend to have shortest wait times. [x]   Unfortunately, we cannot delay appointments for late arrivals or hold slots during phone calls.  Getting Answers and Following Up [x]   Simple Questions & Concerns: For quick questions or basic follow-up after your visit, reach us  at [phone] or MyChart messaging. [x]   Complex Concerns: If your concern is more complex, scheduling an appointment might be best. Discuss this with the staff to find the most suitable option. [x]   Lab & Imaging Results: We'll contact you directly if results are abnormal or you don't use MyChart. Most normal results will be on MyChart within 2-3 business days, with a review message from Dr. Jesus. Haven't heard back in 2 weeks? Need results sooner? Contact us  at [phone]. [x]   Referrals: Our referral coordinator will manage specialist referrals. The specialist's office should contact you within 2 weeks to schedule an appointment. Call us  if you haven't heard from them after 2 weeks.  Staying Connected [x]   MyChart: Activate your MyChart for the fastest way to access results and message us . See the last page of this paperwork for instructions on how to activate.  Bring to Your Next Appointment [x]   Medications: Please bring all your medication bottles to your next appointment to ensure we have an accurate record of your prescriptions. [x]   Health Diaries: If you're monitoring any health conditions at home, keeping a diary of your readings can be very helpful for discussions at your next appointment.  Billing [x]   X-ray & Lab Orders: These are billed by separate companies. Contact the invoicing company directly for questions or concerns. [x]   Visit  Charges: Discuss any billing inquiries with our administrative services team.  Your Satisfaction Matters [x]   Share Your Experience: We strive for your satisfaction! If you have any complaints, or preferably compliments, please let Dr. Jesus know directly or contact our Practice Administrators, Manuelita Rubin or [employer], by asking at the front desk.   Reviewing Your Records [x]   Review this early draft of your clinical encounter notes below and the final encounter summary tomorrow on MyChart after its been completed.  All orders placed so far are visible here: Nasal and sinus discharge -     POC COVID-19 BinaxNow  Chronic pansinusitis  Hypertrophic scar              ALLERGY MANAGEMENT PLAN  This plan is designed to help manage your allergic rhinitis (nasal allergies) effectively. Follow these steps daily for best results.  Sinus saline sprays- use nightly, and after sneezing episodes or exposure to allergen.  Insert deeply and spray mist into nose while leaning over sink at 45 degrees,  while gently breathing. Also blow out onto tissue while leaning forward 45 degrees. Once daily, after a sinus rinse, use sensimist.  Just before bedtime is best. This only needed if allergies acting up.  If this is inadequate add-on once daily for levocetirizine / xyzal  5 mg for nondrowsy antihistamine Take benadryl  25 mg at bedtime also if allergic mucus is persisting   how painful is radiation of keloids Radiation therapy for keloids is generally considered a painless procedure, but some patients may experience temporary, mild pain or discomfort. Unlike cancer treatments,  keloid radiation uses a much lower dose of radiation, which minimizes side effects. The treatment is non-invasive and typically takes only a few minutes per session.  Pain during the procedure The radiation itself does not cause pain. During the treatment, a machine is positioned over the keloid, and patients do not  feel anything as the low-energy electron beam is delivered to the affected area.  Post-procedure pain and side effects Any discomfort that does occur is usually a minor side effect that appears in the days or weeks following the treatment. Common temporary side effects include:  Skin irritation: The treated skin may become red, dry, or flaky, similar to a mild sunburn. Itching or minor pain: Some people report mild itching or tenderness in the treated area. Swelling (edema): This is a possible side effect, especially depending on the location of the keloid. Pigment changes: The skin color in the treatment area may temporarily darken or lighten.  Most of these symptoms are temporary and gradually improve. Your doctor may recommend a topical emollient or steroid ointment to help reduce these side effects.  Preventing keloid recurrence Radiation for keloids is most effective when used immediately after surgical removal to prevent the scar from growing back. Starting radiation within 24 hours of surgery significantly reduces the high rate of keloid recurrence.  This is for informational purposes only. For medical advice or diagnosis, consult a professional. AI responses may include mistakes. Learn more  When allergies cause chronic swelling in sinuses, it leads to sinus infections:    DAILY TREATMENT ROUTINE   Time of Day Treatment Steps  Morning 1. Saline Nasal Spray - Use to cleanse nasal passages 2. Xyzal  (levocetirizine) - Take one tablet daily   Throughout Day Saline Nasal Spray - Use 2 additional times (mid-day and afternoon)   Evening/Bedtime 1. Saline Nasal Rinse - Thoroughly clean nasal passages 2. Flonase  Sensimist - Apply after nasal rinse 3. Benadryl  (diphenhydramine ) - Take 25mg  if experiencing persistent congestion    PROPER TECHNIQUE GUIDE       Saline Nasal Spray/Rinse Technique: Lean forward over sink at a 45-degree angle Turn head slightly to one side Insert spray tip  into upper nostril Spray gently while breathing lightly through your nose Repeat on other side Gently blow nose to clear excess solution Use saline spray 3 times daily to keep nasal passages moist and clear allergens.       Flonase  Sensimist Technique: Shake bottle gently before each use Prime the bottle if it's new or hasn't been used for a week Tilt your head forward slightly Insert tip into nostril, pointing away from the center of your nose Spray while inhaling gently Repeat in other nostril Use Flonase  Sensimist once daily, preferably at bedtime after using saline rinse. It may take several days of regular use to feel maximum benefit.   WHY FLONASE  SENSIMIST?   Benefits of Flonase  Sensimist:  Alcohol-free and scent-free formula - gentler on sensitive nasal passages Fine mist application - more comfortable with less dripping down throat Effectively relieves nasal congestion, sneezing, runny nose, and even eye symptoms 24-hour relief with once-daily dosing Uses a more potent form of fluticasone  that works at a lower dose Less liquid per spray means less discomfort  UNDERSTANDING YOUR MEDICATIONS   Medication How It Works Important Notes  Flonase  Sensimist (fluticasone  furoate) Reduces inflammation in nasal passages, addressing the underlying cause of allergy symptoms - Takes several days for full effect - Use daily for best results - Safe for long-term use  Xyzal  (levocetirizine) Blocks histamine to reduce allergy symptoms like sneezing and itching - Take at the same time each day - May cause drowsiness in some people - Once-daily dosing   Benadryl  (diphenhydramine ) Antihistamine that provides additional relief for breakthrough symptoms - Causes drowsiness - Use only at bedtime - For occasional use when needed   Saline Spray/Rinse Physically removes allergens and moistens nasal passages - Safe to use frequently - Improves effectiveness of other treatments - Reduces nasal  irritation    CONTACT YOUR PROVIDER IF: Your symptoms do not improve after 1-2 weeks of following this plan You develop sinus pain with fever or green/yellow discharge You experience frequent nosebleeds You develop new or worsening symptoms You have questions about your treatment plan     ADDITIONAL ALLERGY MANAGEMENT TIPS   HELPFUL STRATEGIES: ?? Keep windows closed during high pollen seasons ??? Use allergen-proof covers for pillows and mattresses ?? Vacuum regularly with a HEPA filter vacuum ?? Shower and change clothes after spending time outdoors ?? Check local pollen counts and limit outdoor time when counts are high ?? Stay well-hydrated to help keep mucous membranes moist       Nasal Obstruction and Turbinate Reduction This information is provided to help you understand nasal obstruction caused by enlarged turbinates and the treatment options available with ENT specialists. Understanding Your Nasal Passages     Nasal turbinates are structures inside your nose that help warm, filter, and humidify the air you breathe. When these become enlarged (turbinate hypertrophy), they can block airflow and cause significant breathing problems.  Do You Have These Symptoms? Common Symptoms Impact on Daily Life  ? Chronic nasal congestion or stuffiness ? Difficulty breathing through your nose ? Breathing through your mouth, especially at night ? Snoring or disrupted sleep ? Recurrent sinus infections ? Reduced sense of smell or taste  ? Sleep disturbances or fatigue ? Dry mouth upon waking ? Headaches, especially in morning ? Difficulty with physical activity ? Persistent use of nasal sprays with limited relief ? Decreased quality of life   Common Causes    Allergies (seasonal or year-round)    Environmental irritants (smoke, pollution)    Chronic sinusitis    Anatomical variations    Medication side effects When Medical Management Isn't Enough First-Line Treatments  Second-Line Treatments Specialist Treatments   Nasal saline irrigation  Nasal steroid sprays  Antihistamines  Allergy management  Environmental controls   Prescription nasal sprays  Oral steroids (short course)  Immunotherapy  Extended allergy testing  Sleep positioning aids   Nasal endoscopy evaluation  Coblation turbinate reduction  Other turbinate procedures  Septoplasty (if needed)  Complete airway assessment   About Coblation Turbinate Reduction PROCEDURE OVERVIEW: Coblation Turbinate Reduction is a minimally invasive procedure performed by ENT specialists to reduce enlarged nasal turbinates. Unlike traditional surgery, coblation uses low-temperature radiofrequency energy with saline to gently reduce tissue size while preserving the important functions of the turbinates.    Benefits Patients Typically Experience Benefit What to Realistically Expect  Improved Nasal Breathing Most patients report significant improvement in their ability to breathe through the nose, especially at night. The degree of improvement varies by individual, with many experiencing 70-90% improvement in nasal airflow.  Better Sleep Quality Patients often report reduced snoring, less nighttime awakenings, and feeling more rested in the morning. Partners frequently notice the difference in sleep breathing patterns.  Reduced Medication Dependence Many patients are able to reduce their reliance on nasal sprays and decongestants. Some may still need  occasional use during allergy seasons or upper respiratory infections.  Improved Exercise Tolerance Better nasal breathing often translates to improved comfort during physical activity. Patients frequently report they no longer need to gasp for air through their mouth during exercise.  Decreased Sinus Pressure Patients with chronic sinus pressure or fullness often experience relief as the improved airflow allows better sinus drainage. This may reduce the frequency of  sinus infections for some patients.  Convenience Factors The procedure is typically performed in-office under local anesthesia, takes only 10-15 minutes, and most patients return to normal activities within 1-2 days. No external incisions are made.  Patient Experiences     For years, I couldn't breathe through my nose, especially at night. After the procedure, I noticed improvement within a couple of weeks. Now I can actually sleep without a mouth guard and wake up without a dry throat. It wasn't a miracle cure--I still have some congestion during allergy season--but the difference in my daily quality of life has been substantial. -- Patient, 40, procedure performed 14 months ago       The procedure itself was quick and only mildly uncomfortable. Recovery was easier than I expected--just some stuffiness for a few days. The biggest change has been during exercise. I can finally breathe through my nose while running, which has made my workouts much more comfortable. -- Patient, 32, procedure performed 8 months ago  Timeline of Improvement First Week Initial healing phase. Some congestion and mild discomfort is normal. Patients may not notice significant breathing improvement yet.  2-3 Weeks Most patients begin to notice improved breathing as initial swelling subsides. This is when many first experience the benefits of the procedure.  1-2 Months Continued improvement as internal healing completes. The majority of patients experience their maximum benefit during this period.  Long-term Benefits typically last several years. Some patients may experience gradual return of symptoms over time, particularly if allergies or other underlying causes are not well controlled.  What to Expect After Referral Initial ENT Visit Evaluation Procedure (if appropriate) Follow-up   Discussion of symptoms  Medical history review  Physical examination  Nasal endoscopy   Assessment of turbinate size  Airflow  evaluation  Rule out other causes  Treatment options discussed   Local anesthesia  Brief in-office procedure  Minimal discomfort  Same-day return home   1-2 week checkup  Assessment of improvement  Additional care if needed  Long-term management plan   Recovery Process RECOVERY EXPECTATIONS: Most patients experience mild congestion for a few days after the procedure. Nasal saline sprays and avoiding strenuous activity for 1 week are typically recommended. Full benefits may take 2-4 weeks as healing completes.  When to Consider Specialist Referral Consider ENT Referral If:   Nasal obstruction persists despite 4-6 weeks of medical therapy  Patient requires daily nasal decongestants (risk of rebound congestion)  Symptoms significantly impact sleep, daily activities, or quality of life  Physical examination shows significant turbinate hypertrophy  Patient experiences recurrent sinusitis (3+ episodes per year)  Chronic mouth breathing is causing dental or oral issues   Insurance Information Most insurance plans cover this procedure when medically necessary. Prior authorization may be required. Our office staff can help coordinate referrals and insurance verification. Patient Questions & Concerns Is the procedure painful? Most patients report minimal discomfort during and after the procedure. Will I need to miss work? Most patients return to work within 1-2 days.[email or web address] How long do results last? Results typically last several years, though some patients may  need follow-up treatment. Will my insurance cover it? Most insurance plans cover this procedure when medically necessary. Important Warning Signs CONTACT YOUR ENT SPECIALIST IMMEDIATELY IF YOU EXPERIENCE: Heavy bleeding that doesn't stop with gentle pressure Severe pain not controlled by prescribed medication Signs of infection (fever, increasing pain, foul discharge) Difficulty breathing     REFERRAL  INFORMATION   Referral Process Our office will submit the referral electronically. Please allow 3-5 business days for the ENT office to contact you for scheduling.   What to [employer] of current medications  Previous imaging results (if any)  Allergy testing results (if completed)  Diary of failed efforts to improve nasal airflow.  [employer] of Otolaryngology: [email or web address]    Allergy & Asthma Network: www.allergyasthmanetwork.org    American Sleep Association: www.sleepassociation.org

## 2023-10-16 ENCOUNTER — Other Ambulatory Visit (HOSPITAL_BASED_OUTPATIENT_CLINIC_OR_DEPARTMENT_OTHER): Payer: Self-pay

## 2023-10-17 ENCOUNTER — Encounter: Payer: Self-pay | Admitting: Internal Medicine

## 2023-10-18 ENCOUNTER — Other Ambulatory Visit (HOSPITAL_BASED_OUTPATIENT_CLINIC_OR_DEPARTMENT_OTHER): Payer: Self-pay

## 2023-10-19 ENCOUNTER — Other Ambulatory Visit: Payer: Self-pay

## 2023-10-19 ENCOUNTER — Other Ambulatory Visit (HOSPITAL_BASED_OUTPATIENT_CLINIC_OR_DEPARTMENT_OTHER): Payer: Self-pay

## 2023-10-19 MED ORDER — AMOXICILLIN-POT CLAVULANATE 250-62.5 MG/5ML PO SUSR
500.0000 mg | Freq: Two times a day (BID) | ORAL | 0 refills | Status: AC
  Filled 2023-10-19: qty 200, 10d supply, fill #0

## 2023-10-20 ENCOUNTER — Other Ambulatory Visit (HOSPITAL_BASED_OUTPATIENT_CLINIC_OR_DEPARTMENT_OTHER): Payer: Self-pay

## 2023-10-20 ENCOUNTER — Other Ambulatory Visit: Payer: Self-pay

## 2023-10-22 ENCOUNTER — Other Ambulatory Visit: Payer: Self-pay

## 2023-10-25 NOTE — Patient Outreach (Signed)
 Complex Care Management   Visit Note  10/22/2023  Name:  [name] MRN: [id number] DOB: [birth year]  Situation: Referral received for Complex Care Management related to Chronic Pansinusitis with Recurrent Bacterial Infection and Eustachian Tube Dysfuncton AND Painful Growths on both ears, possibly Keloids. I obtained verbal consent from Patient.  Visit completed with Patient  on the phone  Background:   Past Medical History:  Diagnosis Date   Anemia    Asthma    Blood transfusion without reported diagnosis    Diverticulosis    Gestational diabetes    Postoperative anemia due to acute blood loss 10/09/2021   Lab Results      Component    Value    Date/Time           MCV    87.3    01/26/2023 10:16 AM           MCV    87.4    08/11/2022 02:15 PM           MCV    84.8    03/12/2022 12:08 PM           MCV    81.9    01/12/2022 10:48 AM           MCV    80.7    12/06/2021 07:49 PM           MCV    75.8 (L)    05/27/2015 02:41 PM             Lab Results      Component    Value    Date/Time           HGB      Type 2 diabetes mellitus (HCC)     Assessment: Patient Reported Symptoms:  Cognitive Cognitive Status: No symptoms reported, Normal speech and language skills, Insightful and able to interpret abstract concepts, Alert and oriented to person, place, and time Cognitive/Intellectual Conditions Management [RPT]: None reported or documented in medical history or problem list   Health Maintenance Behaviors: Annual physical exam, Stress management Healing Pattern: Unsure Health Facilitated by: Stress management  Neurological Neurological Review of Symptoms: No symptoms reported Neurological Self-Management Outcome: 4 (good)  HEENT HEENT Symptoms Reported: Ear pain, Nasal discharge, Other: (suffers from Chronic Pansinusitis and also has Painful Growths on both Ears, possibly keloids) HEENT Management Strategies: Medication therapy, Routine screening HEENT Self-Management Outcome: 3  (uncertain) HEENT Comment: suffers from Chronic Pansinusitis and also has Painful Growths on both Ears, possibly keloids    Cardiovascular Cardiovascular Symptoms Reported: No symptoms reported Does patient have uncontrolled Hypertension?: No    Respiratory Respiratory Symptoms Reported: No symptoms reported    Endocrine Endocrine Symptoms Reported: No symptoms reported    Gastrointestinal Gastrointestinal Symptoms Reported: No symptoms reported      Genitourinary Genitourinary Symptoms Reported: No symptoms reported    Integumentary Integumentary Symptoms Reported: Other Additional Integumentary Details: Painful growths exist on both ears, possibly Keloids Skin Management Strategies: Coping strategies, Routine screening Skin Self-Management Outcome: 3 (uncertain) Skin Comment: Painful growths exist on both ears, possibly Keloids  Musculoskeletal Musculoskelatal Symptoms Reviewed: No symptoms reported        Psychosocial Psychosocial Symptoms Reported: No symptoms reported          10/25/2023    PHQ2-9 Depression Screening   Little interest or pleasure in doing things    Feeling down, depressed, or hopeless    PHQ-2 - Total Score    Trouble  falling or staying asleep, or sleeping too much    Feeling tired or having little energy    Poor appetite or overeating     Feeling bad about yourself - or that you are a failure or have let yourself or your family down    Trouble concentrating on things, such as reading the newspaper or watching television    Moving or speaking so slowly that other people could have noticed.  Or the opposite - being so fidgety or restless that you have been moving around a lot more than usual    Thoughts that you would be better off dead, or hurting yourself in some way    PHQ2-9 Total Score    If you checked off any problems, how difficult have these problems made it for you to do your work, take care of things at home, or get along with other people     Depression Interventions/Treatment      There were no vitals filed for this visit.  Medications Reviewed Today   Medications were not reviewed in this encounter     Recommendation:   Scheduled 11/10/23 (1 month )PCP Follow-up  Follow Up Plan:   Telephone follow up appointment date/time:  11/05/23 at 11am with RN Care Manager Channing LITTIE Channing A. Gordy RN, BA, [facility], CRRN [place]  [facility] RN Care Manager Direct Dial: [phone]  Fax: [phone]

## 2023-10-25 NOTE — Patient Instructions (Signed)
 Visit Information  Thank you for taking time to visit with me today. Please don't hesitate to contact me if I can be of assistance to you before our next scheduled telephone appointment.  Our next appointment is by telephone on 11/05/23 at 11am  Following is a copy of your care plan:   Goals Addressed             This Visit's Progress    VBCI RN Care Plan       Problems:  Chronic Disease Management support and education needs related to Chronic Pansinusitis with Recurrent Bacterial Infection and Eustachian Tube Dysfunction AND Painful growths on both ears, possibly Keloids  Goal: Over the next 6 months the Patient will continue to work with RN Care Manager and/or Social Worker to address care management and care coordination needs related to Chronic Pansinusitis as evidenced by adherence to care management team scheduled appointments      Interventions:   Evaluation of current treatment plan related to Chronic Pansinusitis, Lacks knowledge of community resource: LCSW referral made, self-management and patient's adherence to plan as established by provider. Discussed plans with patient for ongoing care management follow up and provided patient with direct contact information for care management team Evaluation of current treatment plan related to Chronic Pansinusitis and patient's adherence to plan as established by provider Provided education to patient re: PCP's ambulatory referral to ENT Reviewed medications with patient and discussed current RXs for Augmentin  875-125 mg 2Xs/day, pseudoephedrine  120mg  12 hr tab 2Xs/day Reviewed scheduled/upcoming provider appointments including 1 month follow up scheduled for 11/10/23 from most recent appt with PCP Dr. Jesus which occurred on 10/15/23 Social Work referral for [employer] support Discussed plans with patient for ongoing care management follow up and provided patient with direct contact information for care management team Screening for signs  and symptoms of depression related to chronic disease state  Assessed social determinant of health barriers  Patient Self-Care Activities:  Attend all scheduled provider appointments Call pharmacy for medication refills 3-7 days in advance of running out of medications Call provider office for new concerns or questions  Perform all self care activities independently  Perform IADL's (shopping, preparing meals, housekeeping, managing finances) independently Take medications as prescribed   Work with the social worker to address care coordination needs and will continue to work with the clinical team to address health care and disease management related needs  Plan:  The patient has been provided with contact information for the care management team and has been advised to call with any health related questions or concerns.              Patient verbalizes understanding of instructions and care plan provided today and agrees to view in MyChart. Active MyChart status and patient understanding of how to access instructions and care plan via MyChart confirmed with patient.     The patient has been provided with contact information for the care management team and has been advised to call with any health related questions or concerns.   Please call the care guide team at [phone] if you need to cancel or reschedule your appointment.   Please call [phone]-TALK (toll free, 24 hour hotline) if you are experiencing a Mental Health or Behavioral Health Crisis or need someone to talk to.  SIGNATURE

## 2023-10-29 ENCOUNTER — Ambulatory Visit: Admitting: Family Medicine

## 2023-11-01 ENCOUNTER — Other Ambulatory Visit: Payer: Self-pay

## 2023-11-01 ENCOUNTER — Ambulatory Visit: Admitting: Family

## 2023-11-01 VITALS — BP 118/68 | HR 76 | Temp 97.7°F | Ht 64.0 in | Wt 148.6 lb

## 2023-11-01 DIAGNOSIS — S8010XA Contusion of unspecified lower leg, initial encounter: Secondary | ICD-10-CM | POA: Diagnosis not present

## 2023-11-01 DIAGNOSIS — H9203 Otalgia, bilateral: Secondary | ICD-10-CM | POA: Diagnosis not present

## 2023-11-01 DIAGNOSIS — D500 Iron deficiency anemia secondary to blood loss (chronic): Secondary | ICD-10-CM | POA: Diagnosis not present

## 2023-11-01 DIAGNOSIS — H538 Other visual disturbances: Secondary | ICD-10-CM

## 2023-11-01 DIAGNOSIS — E1165 Type 2 diabetes mellitus with hyperglycemia: Secondary | ICD-10-CM

## 2023-11-01 DIAGNOSIS — D72819 Decreased white blood cell count, unspecified: Secondary | ICD-10-CM | POA: Diagnosis not present

## 2023-11-01 NOTE — Patient Instructions (Addendum)
 It was very nice to see you today!   Call Dermatology regarding the discoloration on your lower legs, referral was sent in July. [facility]:  [phone]. Let us  know if they say they need a new referral.  Call the ENT office (ear infections/sinuses), as they left you a voicemail on 9/15. [phone].      PLEASE NOTE:  If you had any lab tests please let us  know if you have not heard back within a few days. You may see your results on MyChart before we have a chance to review them but we will give you a call once they are reviewed by us . If we ordered any referrals today, please let us  know if you have not heard from their office within the next week.

## 2023-11-01 NOTE — Progress Notes (Unsigned)
 Patient ID: [name], female    DOB: Nov 29, 1975, 48 y.o.   MRN: [id number]  Chief Complaint  Patient presents with   Ear Pain    Pt c/o ear pain and hx of ear frequent ear infections; has had them looked at recently; completed Amox Rx; also wanting to discuss nasal congestion and bruising on BIL LE  Discussed the use of AI scribe software for clinical note transcription with the patient, who gave verbal consent to proceed.  History of Present Illness   [name] is a 48 year old female who presents for follow-up after treatment with Augmentin  for sinus and ear issues.  She continues to experience sinus and ear issues with some improvement after Augmentin  treatment, though slight congestion persists. She uses Flonase  once daily, despite a recommendation for twice daily use, and utilizes Simply Saline and another over-the-counter saline spray for nasal congestion. She takes Sudafed in the morning due to its stimulant effects and is cautious about taking it alongside Adderall, which she takes once daily for focus and attention.  She experiences bruising on her lower legs, particularly around the ankles, without tenderness or recollection of trauma. She has previously worn compression socks for leg fatigue and swelling.  She also complains of forgetting things lately. She takes a low dose of Adderall, but inconsistently.     Assessment and Plan    Eustachian tube dysfunction with ear congestion and otalgia Improvement with Augmentin  and fluticasone  nasal spray, but persistent congestion remains. - Continue Augmentin  as prescribed. - Increase fluticasone  nasal spray to twice daily. - Use Sudafed for congestion, preferably in the morning to avoid sleep disturbances. - Monitor for any adverse effects from medication combination with Adderall.  Allergic rhinitis with congestion Persistent symptoms with nasal congestion and post-nasal drip despite current treatment. - Continue  fluticasone  nasal spray, increase to twice daily until sx are under control. - Use saline nasal spray multiple times daily. - Consider antihistamine nasal spray if symptoms persist. - Discuss alternative nasal sprays with ENT if current treatment is not tolerated.  Attention-deficit hyperactivity disorder (ADHD) Managed with low-dose Adderall, but inconsistent use due causing persistent symptoms. - Take Adderall consistently every morning. - Follow up with prescribing physician (PCP) for potential dose adjustment.  Chronic lower leg discoloration and petechiae Chronic discoloration and petechiae on lower legs, particularly around the ankles, with no tenderness or history of trauma. Previous vascular consultation did not yield a diagnosis. Differential includes petechiae due to low platelets, though recent labs were normal. - Follow up with dermatology for evaluation of leg discoloration and petechiae. - Consider use of mild compression socks to reduce leg fatigue and swelling.      Subjective:    Outpatient Medications Prior to Visit  Medication Sig Dispense Refill   Accu-Chek Softclix Lancets lancets Use as instructed 100 each 12   albuterol  (VENTOLIN  HFA) 108 (90 Base) MCG/ACT inhaler Inhale 2 puffs into the lungs every 6 (six) hours as needed for wheezing or shortness of breath. 36 g 99   amphetamine -dextroamphetamine  (ADDERALL XR) 5 MG 24 hr capsule Take 1 capsule (5 mg total) by mouth daily. 30 capsule 0   ciclopirox  (LOPROX ) 0.77 % cream Apply 1 Application topically 2 (two) times daily. 90 g 1   ciclopirox  (PENLAC ) 8 % solution Apply topically at bedtime. Apply over nail and surrounding skin. Apply daily over previous coat. After seven (7) days, may remove with alcohol and continue cycle. 6.6 mL 0   clindamycin  (  CLINDAGEL ) 1 % gel Apply topically 2 (two) times daily. 30 g 1   Cyanocobalamin  (B-12) 1000 MCG TBCR Take 1,000 mcg by mouth daily. When able to remember 100 tablet 3    escitalopram  (LEXAPRO ) 10 MG tablet Take 1 tablet (10 mg total) by mouth daily. Take half tablet for first 2 weeks to start. 30 tablet 1   famotidine  (PEPCID ) 20 MG tablet Take 1 tablet (20 mg total) by mouth 2 (two) times daily. 30 tablet 6   fluticasone  (FLONASE ) 50 MCG/ACT nasal spray Place 1 spray into both nostrils in the morning and at bedtime. 16 g 5   fluticasone  (FLONASE ) 50 MCG/ACT nasal spray Place 2 sprays into both nostrils daily. 16 g 6   Lancets (FREESTYLE) lancets Use as instructed (Patient taking differently: 1 each by Other route as needed. Use as instructed Using only PRN) 100 each 12   levocetirizine (XYZAL ) 5 MG tablet Take 1 tablet (5 mg total) by mouth every evening. 90 tablet 3   loratadine  (CLARITIN ) 10 MG tablet Take 1 tablet (10 mg total) by mouth daily. 30 tablet 11   nystatin -triamcinolone  ointment (MYCOLOG) Apply 1 Application topically 2 (two) times daily. 30 g 0   ondansetron  (ZOFRAN -ODT) 4 MG disintegrating tablet Dissolve 1 tablet under the tongue every 8 (eight) hours as needed for nausea or vomiting. 20 tablet 2   pseudoephedrine  (SUDAFED) 120 MG 12 hr tablet Take 1 tablet (120 mg total) by mouth 2 (two) times daily. 20 tablet 0   Saline (SIMPLY SALINE) 0.9 % AERS Place 1 spray into the nose daily at 6 (six) AM. 127 mL 11   Saline (SIMPLY SALINE) 0.9 % AERS Place 2 each into the nose as directed. Use nightly for sinus hygiene long-term.  Can also be used as many times daily as desired to assist with clearing congested sinuses. 127 mL 11   Semaglutide ,0.25 or 0.5MG /DOS, (OZEMPIC , 0.25 OR 0.5 MG/DOSE,) 2 MG/3ML SOPN Inject 0.5 mg into the skin once a week. 3 mL 2   tretinoin  (RETIN-A ) 0.025 % cream Apply topically at bedtime. 45 g 0   triamcinolone  cream (KENALOG ) 0.1 % Apply 1 Application topically 2 (two) times daily. 30 g 0   amphetamine -dextroamphetamine  (ADDERALL XR) 5 MG 24 hr capsule Take 1 capsule (5 mg total) by mouth daily. 30 capsule 0    amphetamine -dextroamphetamine  (ADDERALL XR) 5 MG 24 hr capsule Take 1 capsule (5 mg total) by mouth daily. 30 capsule 0   [START ON 11/06/2023] amphetamine -dextroamphetamine  (ADDERALL XR) 5 MG 24 hr capsule Take 1 capsule (5 mg total) by mouth daily. (Patient not taking: Reported on 11/01/2023) 30 capsule 0   [START ON 12/06/2023] amphetamine -dextroamphetamine  (ADDERALL XR) 5 MG 24 hr capsule Take 1 capsule (5 mg total) by mouth daily. (Patient not taking: Reported on 11/01/2023) 30 capsule 0   Blood Glucose Monitoring Suppl (ACCU-CHEK GUIDE) w/Device KIT Use to test blood sugar daily 1 kit 1   Calcium Carb-Cholecalciferol  (CALCIUM + D3 PO) Take 1 tablet by mouth daily. When able to remember (Patient not taking: Reported on 11/01/2023)     Cholecalciferol  (VITAMIN D3) 125 MCG (5000 UT) CAPS Take 1 capsule (5,000 Units total) by mouth daily. (Patient not taking: Reported on 11/01/2023) 90 capsule 3   dapagliflozin  propanediol (FARXIGA ) 5 MG TABS tablet Take 1 tablet (5 mg total) by mouth daily before breakfast. (Patient not taking: Reported on 11/01/2023) 90 tablet 3   glucose blood (ACCU-CHEK GUIDE TEST) test strip Use to test  blood sugar daily 100 each 12   glucose blood (ACCU-CHEK GUIDE) test strip daily 100 each 3   glucose blood test strip Use as instructed 100 each 12   ondansetron  (ZOFRAN -ODT) 4 MG disintegrating tablet Dissolve 1 tablet under the tongue every 6 (six) hours as needed for nausea. 20 tablet 0   terbinafine  (LAMISIL ) 250 MG tablet Take 1 tablet (250 mg total) by mouth daily. For toenail fungal infection (Patient not taking: Reported on 11/01/2023) 84 tablet 0   Vitamin D , Ergocalciferol , (DRISDOL ) 1.25 MG (50000 UNIT) CAPS capsule Take 1 capsule (50,000 Units total) by mouth every 7 (seven) days. (Patient not taking: Reported on 11/01/2023) 12 capsule 1   No facility-administered medications prior to visit.   Past Medical History:  Diagnosis Date   Anemia    Asthma    Blood transfusion  without reported diagnosis    Diverticulosis    Gestational diabetes    Postoperative anemia due to acute blood loss 10/09/2021   Lab Results      Component    Value    Date/Time           MCV    87.3    01/26/2023 10:16 AM           MCV    87.4    08/11/2022 02:15 PM           MCV    84.8    03/12/2022 12:08 PM           MCV    81.9    01/12/2022 10:48 AM           MCV    80.7    12/06/2021 07:49 PM           MCV    75.8 (L)    05/27/2015 02:41 PM             Lab Results      Component    Value    Date/Time           HGB      Type 2 diabetes mellitus (HCC)    Past Surgical History:  Procedure Laterality Date   arm surgery     CERVICAL CERCLAGE     CESAREAN SECTION     DILATION AND CURETTAGE OF UTERUS     HYSTERECTOMY ABDOMINAL WITH SALPINGECTOMY Bilateral 10/08/2021   Procedure: ABDOMINAL HYSTERECTOMY WITH BILATERAL  SALPINGECTOMY;  Surgeon: Cleatus Moccasin, MD;  Location: [facility] OR;  Service: Gynecology;  Laterality: Bilateral;   IR RADIOLOGIST EVAL & MGMT  09/04/2021   TOOTH EXTRACTION     Allergies  Allergen Reactions   Shellfish Allergy Itching and Swelling    Throat swells    Iodinated Contrast Media Itching   Other Itching and Swelling    Hazelnut. Swelling of throat    Latex Itching, Other (See Comments), Rash and Dermatitis    Burning  Other Reaction(s): Other (See Comments)    Burning      Objective:    Physical Exam Vitals and nursing note reviewed.  Constitutional:      Appearance: Normal appearance.  HENT:     Right Ear: Tympanic membrane and ear canal normal.     Left Ear: Tympanic membrane and ear canal normal.     Nose:     Right Turbinates: Swollen.     Left Turbinates: Swollen.     Right Sinus: Frontal sinus tenderness (pressure) present.     Left Sinus: Frontal sinus  tenderness (pressure) present.     Mouth/Throat:     Mouth: Mucous membranes are moist.     Pharynx: No pharyngeal swelling, oropharyngeal exudate, posterior oropharyngeal erythema or uvula  swelling.  Cardiovascular:     Rate and Rhythm: Normal rate and regular rhythm.  Pulmonary:     Effort: Pulmonary effort is normal.     Breath sounds: Normal breath sounds.  Musculoskeletal:        General: Normal range of motion.  Skin:    General: Skin is warm and dry.  Neurological:     Mental Status: She is alert.  Psychiatric:        Mood and Affect: Mood normal.        Behavior: Behavior normal.    BP 118/68 (BP Location: Left Arm, Patient Position: Sitting, Cuff Size: Small)   Pulse 76   Temp 97.7 F (36.5 C) (Temporal)   Ht 5' 4 (1.626 m)   Wt 148 lb 9.6 oz (67.4 kg)   LMP 08/13/2021 (Approximate)   SpO2 99%   BMI 25.51 kg/m  Wt Readings from Last 3 Encounters:  11/01/23 148 lb 9.6 oz (67.4 kg)  10/15/23 148 lb (67.1 kg)  10/07/23 147 lb 12.8 oz (67 kg)      Lucius Krabbe, NP

## 2023-11-02 ENCOUNTER — Other Ambulatory Visit: Payer: Self-pay | Admitting: Internal Medicine

## 2023-11-02 ENCOUNTER — Other Ambulatory Visit (HOSPITAL_BASED_OUTPATIENT_CLINIC_OR_DEPARTMENT_OTHER): Payer: Self-pay

## 2023-11-02 DIAGNOSIS — D72819 Decreased white blood cell count, unspecified: Secondary | ICD-10-CM

## 2023-11-02 DIAGNOSIS — E559 Vitamin D deficiency, unspecified: Secondary | ICD-10-CM

## 2023-11-02 LAB — CBC WITH DIFFERENTIAL/PLATELET
Basophils Absolute: 0.1 K/uL (ref 0.0–0.1)
Basophils Relative: 1.5 % (ref 0.0–3.0)
Eosinophils Absolute: 0.2 K/uL (ref 0.0–0.7)
Eosinophils Relative: 6.5 % — ABNORMAL HIGH (ref 0.0–5.0)
HCT: 39.7 % (ref 36.0–46.0)
Hemoglobin: 13.3 g/dL (ref 12.0–15.0)
Lymphocytes Relative: 47.3 % — ABNORMAL HIGH (ref 12.0–46.0)
Lymphs Abs: 1.8 K/uL (ref 0.7–4.0)
MCHC: 33.5 g/dL (ref 30.0–36.0)
MCV: 85.8 fl (ref 78.0–100.0)
Monocytes Absolute: 0.3 K/uL (ref 0.1–1.0)
Monocytes Relative: 7.8 % (ref 3.0–12.0)
Neutro Abs: 1.4 K/uL (ref 1.4–7.7)
Neutrophils Relative %: 36.9 % — ABNORMAL LOW (ref 43.0–77.0)
Platelets: 220 K/uL (ref 150.0–400.0)
RBC: 4.63 Mil/uL (ref 3.87–5.11)
RDW: 13.8 % (ref 11.5–15.5)
WBC: 3.7 K/uL — ABNORMAL LOW (ref 4.0–10.5)

## 2023-11-02 MED ORDER — VITAMIN D (ERGOCALCIFEROL) 1.25 MG (50000 UNIT) PO CAPS
50000.0000 [IU] | ORAL_CAPSULE | ORAL | 1 refills | Status: AC
  Filled 2023-11-02 – 2023-12-02 (×2): qty 12, 84d supply, fill #0
  Filled 2024-02-17: qty 12, 84d supply, fill #1

## 2023-11-02 NOTE — Progress Notes (Signed)
 Per patient request, placed hematology referral with extensive comments

## 2023-11-03 ENCOUNTER — Ambulatory Visit: Payer: Self-pay | Admitting: Internal Medicine

## 2023-11-03 ENCOUNTER — Other Ambulatory Visit (HOSPITAL_BASED_OUTPATIENT_CLINIC_OR_DEPARTMENT_OTHER): Payer: Self-pay

## 2023-11-04 ENCOUNTER — Other Ambulatory Visit: Admitting: Obstetrics and Gynecology

## 2023-11-04 ENCOUNTER — Other Ambulatory Visit

## 2023-11-05 ENCOUNTER — Other Ambulatory Visit (HOSPITAL_BASED_OUTPATIENT_CLINIC_OR_DEPARTMENT_OTHER): Payer: Self-pay

## 2023-11-05 ENCOUNTER — Other Ambulatory Visit: Payer: Self-pay

## 2023-11-05 ENCOUNTER — Ambulatory Visit: Admitting: Internal Medicine

## 2023-11-05 ENCOUNTER — Encounter: Payer: Self-pay | Admitting: Internal Medicine

## 2023-11-05 VITALS — BP 118/68 | HR 77 | Temp 97.7°F | Ht 64.0 in | Wt 148.2 lb

## 2023-11-05 DIAGNOSIS — D72819 Decreased white blood cell count, unspecified: Secondary | ICD-10-CM | POA: Diagnosis not present

## 2023-11-05 DIAGNOSIS — R41840 Attention and concentration deficit: Secondary | ICD-10-CM | POA: Diagnosis not present

## 2023-11-05 DIAGNOSIS — Z91013 Allergy to seafood: Secondary | ICD-10-CM

## 2023-11-05 DIAGNOSIS — E1165 Type 2 diabetes mellitus with hyperglycemia: Secondary | ICD-10-CM | POA: Diagnosis not present

## 2023-11-05 DIAGNOSIS — E611 Iron deficiency: Secondary | ICD-10-CM

## 2023-11-05 DIAGNOSIS — E559 Vitamin D deficiency, unspecified: Secondary | ICD-10-CM | POA: Diagnosis not present

## 2023-11-05 LAB — LIPID PANEL
Cholesterol: 135 mg/dL (ref 0–200)
HDL: 46.8 mg/dL (ref >39.00)
LDL Cholesterol: 78 mg/dL (ref 0–99)
NonHDL: 88.33
Total CHOL/HDL Ratio: 3
Triglycerides: 54 mg/dL (ref 0.0–149.0)
VLDL: 10.8 mg/dL (ref 0.0–40.0)

## 2023-11-05 LAB — CBC WITH DIFFERENTIAL/PLATELET
Basophils Absolute: 0.1 K/uL (ref 0.0–0.1)
Basophils Relative: 1.8 % (ref 0.0–3.0)
Eosinophils Absolute: 0.3 K/uL (ref 0.0–0.7)
Eosinophils Relative: 7.2 % — ABNORMAL HIGH (ref 0.0–5.0)
HCT: 37.7 % (ref 36.0–46.0)
Hemoglobin: 12.8 g/dL (ref 12.0–15.0)
Lymphocytes Relative: 50.8 % — ABNORMAL HIGH (ref 12.0–46.0)
Lymphs Abs: 1.8 K/uL (ref 0.7–4.0)
MCHC: 34 g/dL (ref 30.0–36.0)
MCV: 85.4 fl (ref 78.0–100.0)
Monocytes Absolute: 0.3 K/uL (ref 0.1–1.0)
Monocytes Relative: 7 % (ref 3.0–12.0)
Neutro Abs: 1.2 K/uL — ABNORMAL LOW (ref 1.4–7.7)
Neutrophils Relative %: 33.2 % — ABNORMAL LOW (ref 43.0–77.0)
Platelets: 211 K/uL (ref 150.0–400.0)
RBC: 4.42 Mil/uL (ref 3.87–5.11)
RDW: 13.8 % (ref 11.5–15.5)
WBC: 3.6 K/uL — ABNORMAL LOW (ref 4.0–10.5)

## 2023-11-05 LAB — VITAMIN D 25 HYDROXY (VIT D DEFICIENCY, FRACTURES): VITD: 42.58 ng/mL (ref 30.00–100.00)

## 2023-11-05 LAB — COMPREHENSIVE METABOLIC PANEL WITH GFR
ALT: 9 U/L (ref 0–35)
AST: 14 U/L (ref 0–37)
Albumin: 4 g/dL (ref 3.5–5.2)
Alkaline Phosphatase: 36 U/L — ABNORMAL LOW (ref 39–117)
BUN: 10 mg/dL (ref 6–23)
CO2: 33 meq/L — ABNORMAL HIGH (ref 19–32)
Calcium: 9.2 mg/dL (ref 8.4–10.5)
Chloride: 103 meq/L (ref 96–112)
Creatinine, Ser: 0.58 mg/dL (ref 0.40–1.20)
GFR: 105.8 mL/min (ref >60.00)
Glucose, Bld: 84 mg/dL (ref 70–99)
Potassium: 4.2 meq/L (ref 3.5–5.1)
Sodium: 140 meq/L (ref 135–145)
Total Bilirubin: 1.5 mg/dL — ABNORMAL HIGH (ref 0.2–1.2)
Total Protein: 6.4 g/dL (ref 6.0–8.3)

## 2023-11-05 LAB — HEMOGLOBIN A1C: Hgb A1c MFr Bld: 5.8 % (ref 4.6–6.5)

## 2023-11-05 MED ORDER — OZEMPIC (0.25 OR 0.5 MG/DOSE) 2 MG/3ML ~~LOC~~ SOPN
0.5000 mg | PEN_INJECTOR | SUBCUTANEOUS | 3 refills | Status: AC
  Filled 2023-11-05: qty 9, 84d supply, fill #0
  Filled 2024-02-17: qty 9, 84d supply, fill #1

## 2023-11-05 MED ORDER — VITAMIN K2-VITAMIN D3 90-125 MCG PO CAPS
1.0000 | ORAL_CAPSULE | Freq: Every day | ORAL | 4 refills | Status: AC
  Filled 2023-11-05: qty 90, fill #0

## 2023-11-05 MED ORDER — AMPHETAMINE-DEXTROAMPHET ER 5 MG PO CP24: 5.0000 mg | ORAL_CAPSULE | Freq: Every day | ORAL | 0 refills | Status: DC

## 2023-11-05 NOTE — Progress Notes (Signed)
 ==============================   [facility]: [phone]   -- Medical Office Visit --  Patient: [name]      Age: 48 y.o.       Sex:  female  Date:   11/05/2023 Today's Healthcare Provider: Bernardino KANDICE Cone, MD  ==============================   Chief Complaint: Diabetes  Discussed the use of AI scribe software for clinical note transcription with the patient, who gave verbal consent to proceed.  History of Present Illness  48 year old female with diabetes who presents for diabetes management.  She has experienced significant improvement in blood sugar levels since starting Ozempic , which she began between April and July of this year. She is currently taking 0.5 mg of Ozempic  weekly and has experienced no side effects. She is satisfied with her current blood sugar control and has decided to maintain her current dose.  She has a history of ADHD and is currently taking Adderall at a dose of 5 mg. She maintains a regular schedule with it and feels it helps her stay more focused. She is content with her current dose and does not wish to adjust it at this time.  She has experienced significant weight loss, dropping from the 190s to 148 pounds. She is mindful of maintaining her bone and muscle mass and ensures adequate protein intake, although she has a shellfish allergy that limits her fish consumption.  She has a history of low white blood cell count and has been referred to hematology for further evaluation. She has previously received iron  infusions and suspects her low iron  intake may contribute to her condition. She prefers to be referred to a new hematologist closer to home due to discomfort with her previous provider.  She has a keloid scar for which she has not yet seen dermatology, but has an upcoming appointment. She has not been using her prescribed acne cream regularly but reports her skin looks good.  Lab Results  Component Value Date    HGBA1C 5.8 11/05/2023   HGBA1C 6.5 (A) 08/11/2023   HGBA1C 11.4 (H) 05/10/2023    Wt Readings from Last 50 Encounters:  11/05/23 148 lb 3.2 oz (67.2 kg)  11/01/23 148 lb 9.6 oz (67.4 kg)  10/15/23 148 lb (67.1 kg)  10/07/23 147 lb 12.8 oz (67 kg)  09/24/23 146 lb 9.6 oz (66.5 kg)  08/09/23 151 lb 9.6 oz (68.8 kg)  06/09/23 155 lb 6 oz (70.5 kg)  05/10/23 158 lb 6.4 oz (71.8 kg)  04/22/23 167 lb 4 oz (75.9 kg)  03/17/23 171 lb 4 oz (77.7 kg)  02/01/23 168 lb (76.2 kg)  01/26/23 169 lb 8 oz (76.9 kg)  11/10/22 172 lb 6 oz (78.2 kg)  08/25/22 171 lb (77.6 kg)  08/11/22 177 lb (80.3 kg)  06/26/22 179 lb 8 oz (81.4 kg)  05/11/22 182 lb 8 oz (82.8 kg)  04/10/22 181 lb 6 oz (82.3 kg)  03/12/22 181 lb 8 oz (82.3 kg)  02/09/22 183 lb (83 kg)  01/12/22 182 lb 0.3 oz (82.6 kg)  12/22/21 183 lb 6 oz (83.2 kg)  12/08/21 184 lb (83.5 kg)  12/06/21 180 lb (81.6 kg)  12/05/21 179 lb 14.3 oz (81.6 kg)  12/05/21 180 lb (81.6 kg)  12/04/21 179 lb (81.2 kg)  11/13/21 192 lb (87.1 kg)  10/23/21 191 lb (86.6 kg)  10/08/21 198 lb (89.8 kg)  09/05/21 198 lb (89.8 kg)  08/21/21 194 lb (88 kg)  08/14/21 194 lb 12.8  oz (88.4 kg)  07/18/21 191 lb 8 oz (86.9 kg)  07/04/21 189 lb 2 oz (85.8 kg)  06/26/21 192 lb (87.1 kg)  06/16/21 193 lb (87.5 kg)  06/06/21 191 lb 6.4 oz (86.8 kg)  10/10/20 182 lb 15.7 oz (83 kg)  09/23/20 184 lb (83.5 kg)  08/19/20 183 lb (83 kg)  08/13/20 182 lb 4.8 oz (82.7 kg)  06/26/20 180 lb (81.6 kg)  06/12/19 162 lb 1.6 oz (73.5 kg)  11/05/18 167 lb 14.4 oz (76.2 kg)  09/14/18 162 lb (73.5 kg)  01/20/16 177 lb 6.4 oz (80.5 kg)  12/31/15 183 lb 4.8 oz (83.1 kg)  08/04/15 176 lb 3.2 oz (79.9 kg)  05/27/15 175 lb (79.4 kg)   BMI Readings from Last 50 Encounters:  11/05/23 25.44 kg/m  11/01/23 25.51 kg/m  10/15/23 25.40 kg/m  10/07/23 25.24 kg/m  09/24/23 25.03 kg/m  09/02/23 26.02 kg/m  08/09/23 26.02 kg/m  06/09/23 26.67 kg/m  05/10/23 27.19 kg/m   04/22/23 28.71 kg/m  03/17/23 29.39 kg/m  02/01/23 28.84 kg/m  01/26/23 29.09 kg/m  11/10/22 29.59 kg/m  08/25/22 29.35 kg/m  08/11/22 30.38 kg/m  06/26/22 30.81 kg/m  05/11/22 31.33 kg/m  04/10/22 31.13 kg/m  03/12/22 31.15 kg/m  02/09/22 31.41 kg/m  01/12/22 31.24 kg/m  12/22/21 31.48 kg/m  12/08/21 31.58 kg/m  12/06/21 30.90 kg/m  12/05/21 30.88 kg/m  12/05/21 30.90 kg/m  12/04/21 30.73 kg/m  12/02/21 35.12 kg/m  11/13/21 32.96 kg/m  10/23/21 32.79 kg/m  10/08/21 33.99 kg/m  09/05/21 36.21 kg/m  08/21/21 33.30 kg/m  08/14/21 35.63 kg/m  07/18/21 35.03 kg/m  07/04/21 34.59 kg/m  06/26/21 32.96 kg/m  06/16/21 33.13 kg/m  06/06/21 32.85 kg/m  10/10/20 31.41 kg/m  09/23/20 31.58 kg/m  08/19/20 31.41 kg/m  08/13/20 32.29 kg/m  06/26/20 31.89 kg/m  06/12/19 27.82 kg/m  11/05/18 28.82 kg/m  09/14/18 27.81 kg/m  01/20/16 30.45 kg/m  12/31/15 31.46 kg/m  Background Reviewed: Problem List: has Iron  deficiency anemia due to chronic blood loss; Vitamin D  deficiency; Mixed anxiety and depressive disorder; Allergic rhinitis; Type 2 diabetes mellitus with hyperglycemia, without long-term current use of insulin  (HCC); Mild intermittent asthma without complication; Attention or concentration deficit; Chronic pain after hysterectomy; PTSD (post-traumatic stress disorder); Central adiposity; Acne; Leukopenia; Postmenopausal estrogen deficiency; Urinary frequency; Vision impairment; 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 Past Medical History:  has a past medical history of Anemia, Asthma, Blood transfusion without reported diagnosis, Diverticulosis, Gestational diabetes, Postoperative anemia due to acute blood loss (10/09/2021), and Type 2 diabetes mellitus (HCC). Past  Surgical History:   has a past surgical history that includes arm surgery; Dilation and curettage of uterus; Cesarean section; Tooth extraction; Cervical cerclage; IR Radiologist Eval & Mgmt (09/04/2021); and Hysterectomy abdominal with salpingectomy (Bilateral, 10/08/2021). Social History:   reports that she has never smoked. She has never used smokeless tobacco. She reports that she does not drink alcohol and does not use drugs. Family History:  family history includes Breast cancer in her maternal aunt; Hypertension in her brother, maternal uncle, and mother. Allergies:  is allergic to shellfish allergy, iodinated contrast media, other, and latex.   Medication Reconciliation: Current Outpatient Medications on File Prior to Visit  Medication Sig   Accu-Chek Softclix Lancets lancets Use as instructed   albuterol  (VENTOLIN  HFA) 108 (90 Base) MCG/ACT inhaler Inhale 2  puffs into the lungs every 6 (six) hours as needed for wheezing or shortness of breath.   amphetamine -dextroamphetamine  (ADDERALL XR) 5 MG 24 hr capsule Take 1 capsule (5 mg total) by mouth daily.   amphetamine -dextroamphetamine  (ADDERALL XR) 5 MG 24 hr capsule Take 1 capsule (5 mg total) by mouth daily.   amphetamine -dextroamphetamine  (ADDERALL XR) 5 MG 24 hr capsule Take 1 capsule (5 mg total) by mouth daily. (Patient not taking: Reported on 11/01/2023)   [START ON 12/06/2023] amphetamine -dextroamphetamine  (ADDERALL XR) 5 MG 24 hr capsule Take 1 capsule (5 mg total) by mouth daily. (Patient not taking: Reported on 11/01/2023)   Blood Glucose Monitoring Suppl (ACCU-CHEK GUIDE) w/Device KIT Use to test blood sugar daily   Calcium Carb-Cholecalciferol  (CALCIUM + D3 PO) Take 1 tablet by mouth daily. When able to remember (Patient not taking: Reported on 11/01/2023)   Cholecalciferol  (VITAMIN D3) 125 MCG (5000 UT) CAPS Take 1 capsule (5,000 Units total) by mouth daily. (Patient not taking: Reported on 11/01/2023)   ciclopirox  (LOPROX ) 0.77 % cream  Apply 1 Application topically 2 (two) times daily.   ciclopirox  (PENLAC ) 8 % solution Apply topically at bedtime. Apply over nail and surrounding skin. Apply daily over previous coat. After seven (7) days, may remove with alcohol and continue cycle.   clindamycin  (CLINDAGEL ) 1 % gel Apply topically 2 (two) times daily.   Cyanocobalamin  (B-12) 1000 MCG TBCR Take 1,000 mcg by mouth daily. When able to remember   dapagliflozin  propanediol (FARXIGA ) 5 MG TABS tablet Take 1 tablet (5 mg total) by mouth daily before breakfast. (Patient not taking: Reported on 11/01/2023)   escitalopram  (LEXAPRO ) 10 MG tablet Take 1 tablet (10 mg total) by mouth daily. Take half tablet for first 2 weeks to start.   famotidine  (PEPCID ) 20 MG tablet Take 1 tablet (20 mg total) by mouth 2 (two) times daily.   fluticasone  (FLONASE ) 50 MCG/ACT nasal spray Place 1 spray into both nostrils in the morning and at bedtime. (Patient not taking: Reported on 11/01/2023)   fluticasone  (FLONASE ) 50 MCG/ACT nasal spray Place 2 sprays into both nostrils daily.   glucose blood (ACCU-CHEK GUIDE TEST) test strip Use to test blood sugar daily   glucose blood (ACCU-CHEK GUIDE) test strip daily   glucose blood test strip Use as instructed   Lancets (FREESTYLE) lancets Use as instructed (Patient taking differently: 1 each by Other route as needed. Use as instructed Using only PRN)   levocetirizine (XYZAL ) 5 MG tablet Take 1 tablet (5 mg total) by mouth every evening.   loratadine  (CLARITIN ) 10 MG tablet Take 1 tablet (10 mg total) by mouth daily.   Multiple Vitamins-Minerals (AZO HORMONAL HEALTH CYCLE CARE) TABS Take by mouth. (Patient not taking: Reported on 11/01/2023)   nystatin -triamcinolone  ointment (MYCOLOG) Apply 1 Application topically 2 (two) times daily.   ondansetron  (ZOFRAN -ODT) 4 MG disintegrating tablet Dissolve 1 tablet under the tongue every 8 (eight) hours as needed for nausea or vomiting.   pseudoephedrine  (SUDAFED) 120 MG 12 hr  tablet Take 1 tablet (120 mg total) by mouth 2 (two) times daily.   Saline (SIMPLY SALINE) 0.9 % AERS Place 1 spray into the nose daily at 6 (six) AM. (Patient not taking: Reported on 11/01/2023)   Saline (SIMPLY SALINE) 0.9 % AERS Place 2 each into the nose as directed. Use nightly for sinus hygiene long-term.  Can also be used as many times daily as desired to assist with clearing congested sinuses.   triamcinolone  cream (KENALOG ) 0.1 %  Apply 1 Application topically 2 (two) times daily.   Vitamin D , Ergocalciferol , (DRISDOL ) 1.25 MG (50000 UNIT) CAPS capsule Take 1 capsule (50,000 Units total) by mouth every 7 (seven) days.   No current facility-administered medications on file prior to visit.   Medications Discontinued During This Encounter  Medication Reason   tretinoin  (RETIN-A ) 0.025 % cream    amphetamine -dextroamphetamine  (ADDERALL XR) 5 MG 24 hr capsule Reorder   Semaglutide ,0.25 or 0.5MG /DOS, (OZEMPIC , 0.25 OR 0.5 MG/DOSE,) 2 MG/3ML SOPN Reorder     Physical Exam:    11/05/2023    1:05 PM 11/01/2023    2:44 PM 10/15/2023   11:32 AM  Vitals with BMI  Height 5' 4 5' 4 5' 4  Weight 148 lbs 3 oz 148 lbs 10 oz 148 lbs  BMI 25.43 25.49 25.39  Systolic 118 118 899  Diastolic 68 68 64  Pulse 77 76 82  Vital signs reviewed.  Nursing notes reviewed. Weight trend reviewed. Physical Activity: Not on file   General Appearance:  No acute distress appreciable.   Well-groomed, healthy-appearing female.  Well proportioned with no abnormal fat distribution.  Good muscle tone. Pulmonary:  Normal work of breathing at rest, no respiratory distress apparent. SpO2: 98 %  Musculoskeletal: All extremities are intact.  Neurological:  Awake, alert, oriented, and engaged.  No obvious focal neurological deficits or cognitive impairments.  Sensorium seems unclouded.   Speech is clear and coherent with logical content. Psychiatric:  Appropriate mood, pleasant and cooperative demeanor, thoughtful and  engaged during the exam   Verbalized to patient: Physical Exam MEASUREMENTS: Weight- 148.   Results:   Verbalized to patient: Results LABS Leukopenia (low WBC)     10/07/2023   10:31 AM 09/24/2023   10:46 AM 06/09/2023   12:06 PM 05/10/2023    1:47 PM  PHQ 2/9 Scores  PHQ - 2 Score 4 1 3 6   PHQ- 9 Score 21  18 24    F2 Office Visit on 11/05/2023  Component Date Value Ref Range Status   WBC 11/05/2023 3.6 (L)  4.0 - 10.5 K/uL Final   RBC 11/05/2023 4.42  3.87 - 5.11 Mil/uL Final   Hemoglobin 11/05/2023 12.8  12.0 - 15.0 g/dL Final   HCT 89/96/7974 37.7  36.0 - 46.0 % Final   MCV 11/05/2023 85.4  78.0 - 100.0 fl Final   MCHC 11/05/2023 34.0  30.0 - 36.0 g/dL Final   RDW 89/96/7974 13.8  11.5 - 15.5 % Final   Platelets 11/05/2023 211.0  150.0 - 400.0 K/uL Final   Neutrophils Relative % 11/05/2023 33.2 (L)  43.0 - 77.0 % Final   Lymphocytes Relative 11/05/2023 50.8 (H)  12.0 - 46.0 % Final   Monocytes Relative 11/05/2023 7.0  3.0 - 12.0 % Final   Eosinophils Relative 11/05/2023 7.2 (H)  0.0 - 5.0 % Final   Basophils Relative 11/05/2023 1.8  0.0 - 3.0 % Final   Neutro Abs 11/05/2023 1.2 (L)  1.4 - 7.7 K/uL Final   Lymphs Abs 11/05/2023 1.8  0.7 - 4.0 K/uL Final   Monocytes Absolute 11/05/2023 0.3  0.1 - 1.0 K/uL Final   Eosinophils Absolute 11/05/2023 0.3  0.0 - 0.7 K/uL Final   Basophils Absolute 11/05/2023 0.1  0.0 - 0.1 K/uL Final   Sodium 11/05/2023 140  135 - 145 mEq/L Final   Potassium 11/05/2023 4.2  3.5 - 5.1 mEq/L Final   Chloride 11/05/2023 103  96 - 112 mEq/L Final   CO2 11/05/2023 33 (  H)  19 - 32 mEq/L Final   Glucose, Bld 11/05/2023 84  70 - 99 mg/dL Final   BUN 89/96/7974 10  6 - 23 mg/dL Final   Creatinine, Ser 11/05/2023 0.58  0.40 - 1.20 mg/dL Final   Total Bilirubin 11/05/2023 1.5 (H)  0.2 - 1.2 mg/dL Final   Alkaline Phosphatase 11/05/2023 36 (L)  39 - 117 U/L Final   AST 11/05/2023 14  0 - 37 U/L Final   ALT 11/05/2023 9  0 - 35 U/L Final   Total  Protein 11/05/2023 6.4  6.0 - 8.3 g/dL Final   Albumin  11/05/2023 4.0  3.5 - 5.2 g/dL Final   GFR 89/96/7974 105.80  >60.00 mL/min Final   Calcium 11/05/2023 9.2  8.4 - 10.5 mg/dL Final   Cholesterol 89/96/7974 135  0 - 200 mg/dL Final   Triglycerides 89/96/7974 54.0  0.0 - 149.0 mg/dL Final   HDL 89/96/7974 46.80  >39.00 mg/dL Final   VLDL 89/96/7974 10.8  0.0 - 40.0 mg/dL Final   LDL Cholesterol 11/05/2023 78  0 - 99 mg/dL Final   Total CHOL/HDL Ratio 11/05/2023 3   Final   NonHDL 11/05/2023 88.33   Final   Hgb A1c MFr Bld 11/05/2023 5.8  4.6 - 6.5 % Final   VITD 11/05/2023 42.58  30.00 - 100.00 ng/mL Final  Office Visit on 11/01/2023  Component Date Value Ref Range Status   WBC 11/01/2023 3.7 (L)  4.0 - 10.5 K/uL Final   RBC 11/01/2023 4.63  3.87 - 5.11 Mil/uL Final   Hemoglobin 11/01/2023 13.3  12.0 - 15.0 g/dL Final   HCT 90/70/7974 39.7  36.0 - 46.0 % Final   MCV 11/01/2023 85.8  78.0 - 100.0 fl Final   MCHC 11/01/2023 33.5  30.0 - 36.0 g/dL Final   RDW 90/70/7974 13.8  11.5 - 15.5 % Final   Platelets 11/01/2023 220.0  150.0 - 400.0 K/uL Final   Neutrophils Relative % 11/01/2023 36.9 (L)  43.0 - 77.0 % Final   Lymphocytes Relative 11/01/2023 47.3 (H)  12.0 - 46.0 % Final   Monocytes Relative 11/01/2023 7.8  3.0 - 12.0 % Final   Eosinophils Relative 11/01/2023 6.5 (H)  0.0 - 5.0 % Final   Basophils Relative 11/01/2023 1.5  0.0 - 3.0 % Final   Neutro Abs 11/01/2023 1.4  1.4 - 7.7 K/uL Final   Lymphs Abs 11/01/2023 1.8  0.7 - 4.0 K/uL Final   Monocytes Absolute 11/01/2023 0.3  0.1 - 1.0 K/uL Final   Eosinophils Absolute 11/01/2023 0.2  0.0 - 0.7 K/uL Final   Basophils Absolute 11/01/2023 0.1  0.0 - 0.1 K/uL Final  Office Visit on 10/15/2023  Component Date Value Ref Range Status   SARS Coronavirus 2 Ag 10/15/2023 Negative  Negative Final  Office Visit on 09/24/2023  Component Date Value Ref Range Status   Color, Urine 09/24/2023 YELLOW  YELLOW Final   APPearance 09/24/2023  CLEAR  CLEAR Final   Specific Gravity, Urine 09/24/2023 1.025  1.001 - 1.035 Final   pH 09/24/2023 5.5  5.0 - 8.0 Final   Glucose, UA 09/24/2023 3+ (A)  NEGATIVE Final   Bilirubin Urine 09/24/2023 NEGATIVE  NEGATIVE Final   Ketones, ur 09/24/2023 NEGATIVE  NEGATIVE Final   Hgb urine dipstick 09/24/2023 NEGATIVE  NEGATIVE Final   Protein, ur 09/24/2023 NEGATIVE  NEGATIVE Final   Nitrite 09/24/2023 NEGATIVE  NEGATIVE Final   Leukocytes,Ua 09/24/2023 NEGATIVE  NEGATIVE Final   MICRO NUMBER: 09/24/2023 [id number]  Final   SPECIMEN QUALITY: 09/24/2023 Adequate   Final   Sample Source 09/24/2023 URINE   Final   STATUS: 09/24/2023 FINAL   Final   Result: 09/24/2023 No Growth   Final  Lab on 08/24/2023  Component Date Value Ref Range Status   Cholesterol 08/24/2023 159  0 - 200 mg/dL Final   Triglycerides 92/77/7974 62.0  0.0 - 149.0 mg/dL Final   HDL 92/77/7974 46.10  >39.00 mg/dL Final   VLDL 92/77/7974 12.4  0.0 - 40.0 mg/dL Final   LDL Cholesterol 08/24/2023 100 (H)  0 - 99 mg/dL Final   Total CHOL/HDL Ratio 08/24/2023 3   Final   NonHDL 08/24/2023 112.55   Final   Sodium 08/24/2023 138  135 - 145 mEq/L Final   Potassium 08/24/2023 3.7  3.5 - 5.1 mEq/L Final   Chloride 08/24/2023 103  96 - 112 mEq/L Final   CO2 08/24/2023 29  19 - 32 mEq/L Final   Glucose, Bld 08/24/2023 83  70 - 99 mg/dL Final   BUN 92/77/7974 14  6 - 23 mg/dL Final   Creatinine, Ser 08/24/2023 0.69  0.40 - 1.20 mg/dL Final   Total Bilirubin 08/24/2023 1.6 (H)  0.2 - 1.2 mg/dL Final   Alkaline Phosphatase 08/24/2023 40  39 - 117 U/L Final   AST 08/24/2023 15  0 - 37 U/L Final   ALT 08/24/2023 8  0 - 35 U/L Final   Total Protein 08/24/2023 7.3  6.0 - 8.3 g/dL Final   Albumin  08/24/2023 4.4  3.5 - 5.2 g/dL Final   GFR 92/77/7974 101.60  >60.00 mL/min Final   Calcium 08/24/2023 9.2  8.4 - 10.5 mg/dL Final   Microalb, Ur 92/77/7974 0.7  0.0 - 1.9 mg/dL Final   Creatinine,U 92/77/7974 142.9  mg/dL Final   Microalb  Creat Ratio 08/24/2023 4.9  0.0 - 30.0 mg/g Final   WBC 08/24/2023 4.2  4.0 - 10.5 K/uL Final   RBC 08/24/2023 4.90  3.87 - 5.11 Mil/uL Final   Hemoglobin 08/24/2023 14.2  12.0 - 15.0 g/dL Final   HCT 92/77/7974 42.1  36.0 - 46.0 % Final   MCV 08/24/2023 85.8  78.0 - 100.0 fl Final   MCHC 08/24/2023 33.7  30.0 - 36.0 g/dL Final   RDW 92/77/7974 13.5  11.5 - 15.5 % Final   Platelets 08/24/2023 208.0  150.0 - 400.0 K/uL Final   Neutrophils Relative % 08/24/2023 37.3 (L)  43.0 - 77.0 % Final   Lymphocytes Relative 08/24/2023 45.9  12.0 - 46.0 % Final   Monocytes Relative 08/24/2023 8.0  3.0 - 12.0 % Final   Eosinophils Relative 08/24/2023 6.7 (H)  0.0 - 5.0 % Final   Basophils Relative 08/24/2023 2.1  0.0 - 3.0 % Final   Neutro Abs 08/24/2023 1.6  1.4 - 7.7 K/uL Final   Lymphs Abs 08/24/2023 1.9  0.7 - 4.0 K/uL Final   Monocytes Absolute 08/24/2023 0.3  0.1 - 1.0 K/uL Final   Eosinophils Absolute 08/24/2023 0.3  0.0 - 0.7 K/uL Final   Basophils Absolute 08/24/2023 0.1  0.0 - 0.1 K/uL Final  Orders Only on 08/11/2023  Component Date Value Ref Range Status   Hemoglobin A1C 08/11/2023 6.5 (A)  4.0 - 5.6 % Final  Office Visit on 06/09/2023  Component Date Value Ref Range Status   Color, UA 06/09/2023 yellow   Final   Clarity, UA 06/09/2023 clear   Final   Glucose, UA 06/09/2023 Positive (A)  Negative Final  Bilirubin, UA 06/09/2023 negative   Final   Ketones, UA 06/09/2023 +-5   Final   Spec Grav, UA 06/09/2023 1.025  1.010 - 1.025 Final   Blood, UA 06/09/2023 Negative   Final   pH, UA 06/09/2023 5.0  5.0 - 8.0 Final   Protein, UA 06/09/2023 Negative  Negative Final   Urobilinogen, UA 06/09/2023 0.2  0.2 or 1.0 E.U./dL Final   Nitrite, UA 94/92/7974 negative   Final   Leukocytes, UA 06/09/2023 Negative  Negative Final   Odor 06/09/2023 negative   Final  Office Visit on 05/10/2023  Component Date Value Ref Range Status   WBC 05/10/2023 4.4  4.0 - 10.5 K/uL Final   RBC 05/10/2023 5.12  (H)  3.87 - 5.11 Mil/uL Final   Hemoglobin 05/10/2023 15.1 (H)  12.0 - 15.0 g/dL Final   HCT 95/92/7974 44.2  36.0 - 46.0 % Final   MCV 05/10/2023 86.3  78.0 - 100.0 fl Final   MCHC 05/10/2023 34.2  30.0 - 36.0 g/dL Final   RDW 95/92/7974 13.6  11.5 - 15.5 % Final   Platelets 05/10/2023 205.0  150.0 - 400.0 K/uL Final   Neutrophils Relative % 05/10/2023 38.6 (L)  43.0 - 77.0 % Final   Lymphocytes Relative 05/10/2023 44.6  12.0 - 46.0 % Final   Monocytes Relative 05/10/2023 7.3  3.0 - 12.0 % Final   Eosinophils Relative 05/10/2023 8.0 (H)  0.0 - 5.0 % Final   Basophils Relative 05/10/2023 1.5  0.0 - 3.0 % Final   Neutro Abs 05/10/2023 1.7  1.4 - 7.7 K/uL Final   Lymphs Abs 05/10/2023 2.0  0.7 - 4.0 K/uL Final   Monocytes Absolute 05/10/2023 0.3  0.1 - 1.0 K/uL Final   Eosinophils Absolute 05/10/2023 0.3  0.0 - 0.7 K/uL Final   Basophils Absolute 05/10/2023 0.1  0.0 - 0.1 K/uL Final   Sodium 05/10/2023 138  135 - 145 mEq/L Final   Potassium 05/10/2023 4.3  3.5 - 5.1 mEq/L Final   Chloride 05/10/2023 100  96 - 112 mEq/L Final   CO2 05/10/2023 29  19 - 32 mEq/L Final   Glucose, Bld 05/10/2023 347 (H)  70 - 99 mg/dL Final   BUN 95/92/7974 15  6 - 23 mg/dL Final   Creatinine, Ser 05/10/2023 0.77  0.40 - 1.20 mg/dL Final   Total Bilirubin 05/10/2023 1.3 (H)  0.2 - 1.2 mg/dL Final   Alkaline Phosphatase 05/10/2023 61  39 - 117 U/L Final   AST 05/10/2023 12  0 - 37 U/L Final   ALT 05/10/2023 11  0 - 35 U/L Final   Total Protein 05/10/2023 7.5  6.0 - 8.3 g/dL Final   Albumin  05/10/2023 4.5  3.5 - 5.2 g/dL Final   GFR 95/92/7974 90.49  >60.00 mL/min Final   Calcium 05/10/2023 9.7  8.4 - 10.5 mg/dL Final   Cholesterol 95/92/7974 157  0 - 200 mg/dL Final   Triglycerides 95/92/7974 85.0  0.0 - 149.0 mg/dL Final   HDL 95/92/7974 47.60  >39.00 mg/dL Final   VLDL 95/92/7974 17.0  0.0 - 40.0 mg/dL Final   LDL Cholesterol 05/10/2023 93  0 - 99 mg/dL Final   Total CHOL/HDL Ratio 05/10/2023 3   Final    NonHDL 05/10/2023 109.51   Final   Hgb A1c MFr Bld 05/10/2023 11.4 (H)  4.6 - 6.5 % Final   VITD 05/10/2023 29.53 (L)  30.00 - 100.00 ng/mL Final   Vitamin B-12 05/10/2023 >1537 (H)  211 -  911 pg/mL Final   Folate 05/10/2023 >25.2  >5.9 ng/mL Final   Microalb, Ur 05/10/2023 1.0  0.0 - 1.9 mg/dL Final   Creatinine,U 95/92/7974 111.8  mg/dL Final   Microalb Creat Ratio 05/10/2023 9.1  0.0 - 30.0 mg/g Final   Color, Urine 05/10/2023 YELLOW  YELLOW Final   APPearance 05/10/2023 CLEAR  CLEAR Final   Specific Gravity, Urine 05/10/2023 1.039 (H)  1.001 - 1.035 Final   pH 05/10/2023 < OR = 5.0  5.0 - 8.0 Final   Glucose, UA 05/10/2023 3+ (A)  NEGATIVE Final   Bilirubin Urine 05/10/2023 NEGATIVE  NEGATIVE Final   Ketones, ur 05/10/2023 3+ (A)  NEGATIVE Final   Hgb urine dipstick 05/10/2023 NEGATIVE  NEGATIVE Final   Protein, ur 05/10/2023 NEGATIVE  NEGATIVE Final   Nitrites, Initial 05/10/2023 NEGATIVE  NEGATIVE Final   Leukocyte Esterase 05/10/2023 NEGATIVE  NEGATIVE Final   WBC, UA 05/10/2023 0-5  0 - 5 /HPF Final   RBC / HPF 05/10/2023 NONE SEEN  0 - 2 /HPF Final   Squamous Epithelial / HPF 05/10/2023 0-5  < OR = 5 /HPF Final   Bacteria, UA 05/10/2023 NONE SEEN  NONE SEEN /HPF Final   Hyaline Cast 05/10/2023 NONE SEEN  NONE SEEN /LPF Final   Note 05/10/2023    Final   Reflexve Urine Culture 05/10/2023    Final  Office Visit on 03/17/2023  Component Date Value Ref Range Status   Microalb, Ur 03/17/2023 <0.7  0.0 - 1.9 mg/dL Final   Creatinine,U 97/87/7974 97.5  mg/dL Final   Microalb Creat Ratio 03/17/2023 7.2  0.0 - 30.0 mg/g Final  Office Visit on 02/01/2023  Component Date Value Ref Range Status   SARS Coronavirus 2 Ag 02/01/2023 Negative  Negative Final   Rapid Strep A Screen 02/01/2023 Positive (A)  Negative Final   Influenza A, POC 02/01/2023 Positive (A)  Negative Final   Influenza B, POC 02/01/2023 Negative  Negative Final  There may be more visits with results that are not  included.  No image results found. No results found.       ASSESSMENT & PLAN   Assessment & Plan Type 2 diabetes mellitus with hyperglycemia, without long-term current use of insulin  (HCC) Her type 2 diabetes is well-controlled with Ozempic  0.5 mg weekly, showing significant improvement in blood glucose levels since April and reducing the risk of complications like retinopathy, nephropathy, and cardiovascular events. Her weight has decreased from 190 lbs to 148 lbs, indicating effective weight management. Although an increase to 1 mg weekly was discussed for further weight loss, she chose to remain on the current dose to avoid potential side effects such as nausea, vomiting, diarrhea, and constipation. Continue Ozempic  0.5 mg weekly with a year's supply and three refills. Perform comprehensive blood work, including a diabetes panel, and prescribe vitamin D3 with K2 to support bone health. Attention or concentration deficit ADHD is managed with Adderall 5 mg, which improves focus and functionality. She is on a regular schedule with the medication and reports improved concentration. Continue Adderall 5 mg, extend the prescription to 90 days, and schedule a follow-up in 2-3 months for ADHD management. Vitamin D  deficiency  Leukopenia, unspecified type Leukopenia is present with fluctuating white blood cell counts, possibly due to dietary insufficiency in red meat leading to low iron  and B12 levels, affecting bone marrow function. She prefers a new hematologist due to discomfort with the previous provider. Refer to a new hematologist for evaluation and encourage  increased intake of lean red meat for iron  and B12. Iron  deficiency History of Iron  deficiency requiring  infusions, likely due to insufficient dietary intake of red meat, with no active bleeding reported. Discussed the importance of dietary changes to improve iron  levels. Ensure the hematology referral includes evaluation for iron  infusions  and encourage increased intake of lean red meat. Lab Results  Component Value Date   IRON  98 03/12/2022   IRON  73 01/12/2022   IRON  16 (L) 11/13/2021   TIBC 261.8 03/12/2022   TIBC 246 (L) 01/12/2022   TIBC 354 11/13/2021   FERRITIN 75.9 03/12/2022   FERRITIN 78 01/12/2022   FERRITIN 8 (L) 11/13/2021   IRONPCTSAT 37.4 03/12/2022   IRONPCTSAT 30 01/12/2022   IRONPCTSAT 5 (L) 11/13/2021   MCV 85.4 11/05/2023   MCV 85.8 11/01/2023   MCV 85.8 08/24/2023   HGB 12.8 11/05/2023   HGB 13.3 11/01/2023   HGB 14.2 08/24/2023    Shellfish allergy She has a shellfish allergy with significant reactions, including facial swelling and eye symptoms upon exposure. Avoid shellfish and environments with potential shellfish contamination.  ORDER ASSOCIATIONS  #   DIAGNOSIS / CONDITION ICD-10 ENCOUNTER ORDER     ICD-10-CM   1. Type 2 diabetes mellitus with hyperglycemia, without long-term current use of insulin  (HCC)  E11.65 Semaglutide ,0.25 or 0.5MG /DOS, (OZEMPIC , 0.25 OR 0.5 MG/DOSE,) 2 MG/3ML SOPN    CBC with Differential/Platelet    Comprehensive metabolic panel with GFR    Lipid panel    Hemoglobin A1c    Microalbumin / creatinine urine ratio    2. Attention or concentration deficit  R41.840 amphetamine -dextroamphetamine  (ADDERALL XR) 5 MG 24 hr capsule    3. Vitamin D  deficiency  E55.9 Vitamin D  (25 hydroxy)    Vitamin D -Vitamin K (VITAMIN K2-VITAMIN D3) 90-125 MCG CAPS    4. Leukopenia, unspecified type  D72.819     5. Iron  deficiency  E61.1     6. Shellfish allergy  Z91.013            Orders Placed in Encounter:   Lab Orders         CBC with Differential/Platelet         Comprehensive metabolic panel with GFR         Lipid panel         Hemoglobin A1c         Microalbumin / creatinine urine ratio         Vitamin D  (25 hydroxy)     Meds ordered this encounter  Medications   Semaglutide ,0.25 or 0.5MG /DOS, (OZEMPIC , 0.25 OR 0.5 MG/DOSE,) 2 MG/3ML SOPN    Sig: Inject 0.5  mg into the skin once a week.    Dispense:  9 mL    Refill:  3   amphetamine -dextroamphetamine  (ADDERALL XR) 5 MG 24 hr capsule    Sig: Take 1 capsule (5 mg total) by mouth daily.    Dispense:  30 capsule    Refill:  0   Vitamin D -Vitamin K (VITAMIN K2-VITAMIN D3) 90-125 MCG CAPS    Sig: Take 1 tablet by mouth daily at 6 (six) AM.    Dispense:  90 capsule    Refill:  4    Orders Placed This Encounter  Procedures   CBC with Differential/Platelet   Comprehensive metabolic panel with GFR    [place]    Has the patient fasted?:   No   Lipid panel    [place]    Has the patient  fasted?:   No   Hemoglobin A1c    [place]   Microalbumin / creatinine urine ratio   Vitamin D  (25 hydroxy)       This document was synthesized by artificial intelligence ([employer]) using HIPAA-compliant recording of the clinical interaction;   We discussed the use of AI scribe software for clinical note transcription with the patient, who gave verbal consent to proceed. additional Info: This encounter employed state-of-the-art, real-time, collaborative documentation. The patient actively reviewed and assisted in updating their electronic medical record on a shared screen, ensuring transparency and facilitating joint problem-solving for the problem list, overview, and plan. This approach promotes accurate, informed care. The treatment plan was discussed and reviewed in detail, including medication safety, potential side effects, and all patient questions. We confirmed understanding and comfort with the plan. Follow-up instructions were established, including contacting the office for any concerns, returning if symptoms worsen, persist, or new symptoms develop, and precautions for potential emergency department visits.

## 2023-11-05 NOTE — Patient Instructions (Addendum)
 Lab Results  Component Value Date   HGBA1C 6.5 (A) 08/11/2023   HGBA1C 11.4 (H) 05/10/2023   HGBA1C 7.0 (H) 01/26/2023    Wt Readings from Last 50 Encounters:  11/05/23 148 lb 3.2 oz (67.2 kg)  11/01/23 148 lb 9.6 oz (67.4 kg)  10/15/23 148 lb (67.1 kg)  10/07/23 147 lb 12.8 oz (67 kg)  09/24/23 146 lb 9.6 oz (66.5 kg)  08/09/23 151 lb 9.6 oz (68.8 kg)  06/09/23 155 lb 6 oz (70.5 kg)  05/10/23 158 lb 6.4 oz (71.8 kg)  04/22/23 167 lb 4 oz (75.9 kg)  03/17/23 171 lb 4 oz (77.7 kg)  02/01/23 168 lb (76.2 kg)  01/26/23 169 lb 8 oz (76.9 kg)  11/10/22 172 lb 6 oz (78.2 kg)  08/25/22 171 lb (77.6 kg)  08/11/22 177 lb (80.3 kg)  06/26/22 179 lb 8 oz (81.4 kg)  05/11/22 182 lb 8 oz (82.8 kg)  04/10/22 181 lb 6 oz (82.3 kg)  03/12/22 181 lb 8 oz (82.3 kg)  02/09/22 183 lb (83 kg)  01/12/22 182 lb 0.3 oz (82.6 kg)  12/22/21 183 lb 6 oz (83.2 kg)  12/08/21 184 lb (83.5 kg)  12/06/21 180 lb (81.6 kg)  12/05/21 179 lb 14.3 oz (81.6 kg)  12/05/21 180 lb (81.6 kg)  12/04/21 179 lb (81.2 kg)  11/13/21 192 lb (87.1 kg)  10/23/21 191 lb (86.6 kg)  10/08/21 198 lb (89.8 kg)  09/05/21 198 lb (89.8 kg)  08/21/21 194 lb (88 kg)  08/14/21 194 lb 12.8 oz (88.4 kg)  07/18/21 191 lb 8 oz (86.9 kg)  07/04/21 189 lb 2 oz (85.8 kg)  06/26/21 192 lb (87.1 kg)  06/16/21 193 lb (87.5 kg)  06/06/21 191 lb 6.4 oz (86.8 kg)  10/10/20 182 lb 15.7 oz (83 kg)  09/23/20 184 lb (83.5 kg)  08/19/20 183 lb (83 kg)  08/13/20 182 lb 4.8 oz (82.7 kg)  06/26/20 180 lb (81.6 kg)  06/12/19 162 lb 1.6 oz (73.5 kg)  11/05/18 167 lb 14.4 oz (76.2 kg)  09/14/18 162 lb (73.5 kg)  01/20/16 177 lb 6.4 oz (80.5 kg)  12/31/15 183 lb 4.8 oz (83.1 kg)  08/04/15 176 lb 3.2 oz (79.9 kg)  05/27/15 175 lb (79.4 kg)   BMI Readings from Last 50 Encounters:  11/05/23 25.44 kg/m  11/01/23 25.51 kg/m  10/15/23 25.40 kg/m  10/07/23 25.24 kg/m  09/24/23 25.03 kg/m  09/02/23 26.02 kg/m  08/09/23 26.02 kg/m   06/09/23 26.67 kg/m  05/10/23 27.19 kg/m  04/22/23 28.71 kg/m  03/17/23 29.39 kg/m  02/01/23 28.84 kg/m  01/26/23 29.09 kg/m  11/10/22 29.59 kg/m  08/25/22 29.35 kg/m  08/11/22 30.38 kg/m  06/26/22 30.81 kg/m  05/11/22 31.33 kg/m  04/10/22 31.13 kg/m  03/12/22 31.15 kg/m  02/09/22 31.41 kg/m  01/12/22 31.24 kg/m  12/22/21 31.48 kg/m  12/08/21 31.58 kg/m  12/06/21 30.90 kg/m  12/05/21 30.88 kg/m  12/05/21 30.90 kg/m  12/04/21 30.73 kg/m  12/02/21 35.12 kg/m  11/13/21 32.96 kg/m  10/23/21 32.79 kg/m  10/08/21 33.99 kg/m  09/05/21 36.21 kg/m  08/21/21 33.30 kg/m  08/14/21 35.63 kg/m  07/18/21 35.03 kg/m  07/04/21 34.59 kg/m  06/26/21 32.96 kg/m  06/16/21 33.13 kg/m  06/06/21 32.85 kg/m  10/10/20 31.41 kg/m  09/23/20 31.58 kg/m  08/19/20 31.41 kg/m  08/13/20 32.29 kg/m  06/26/20 31.89 kg/m  06/12/19 27.82 kg/m  11/05/18 28.82 kg/m  09/14/18 27.81 kg/m  01/20/16 30.45 kg/m  12/31/15 31.46 kg/m  It was a pleasure seeing you today! Your health and satisfaction are our top priorities.  Bernardino Cone, MD  VISIT SUMMARY: Today, we discussed your diabetes management, ADHD, weight loss, low white blood cell count, iron  deficiency, and shellfish allergy. You have shown significant improvement in your blood sugar levels and weight management. We also addressed your concerns about your current hematologist and made plans for a new referral.  YOUR PLAN: -TYPE 2 DIABETES MELLITUS: Type 2 diabetes is a condition where your body does not use insulin  properly, leading to high blood sugar levels. Your diabetes is well-controlled with Ozempic  0.5 mg weekly, and you have experienced significant improvement in your blood sugar levels and weight. We will continue with your current dose and perform comprehensive blood work, including a diabetes panel. Additionally, we will prescribe vitamin D3 with K2 to support your bone health.  -LEUKOPENIA:  Leukopenia is a condition where you have a low white blood cell count, which can make you more susceptible to infections. This may be due to low iron  and B12 levels from insufficient dietary intake of red meat. We will refer you to a new hematologist for further evaluation and encourage you to increase your intake of lean red meat to improve your iron  and B12 levels.  -IRON  DEFICIENCY REQUIRING INFUSIONS: Iron  deficiency means you have low levels of iron  in your body, which can cause fatigue and other symptoms. This is likely due to insufficient dietary intake of red meat. We discussed the importance of dietary changes to improve your iron  levels and will ensure the hematology referral includes evaluation for iron  infusions. Please increase your intake of lean red meat.  -ATTENTION-DEFICIT HYPERACTIVITY DISORDER (ADHD): ADHD is a condition that affects your ability to focus and stay organized. You are currently managing it well with Adderall 5 mg, which helps improve your concentration. We will continue with your current dose and extend your prescription to 90 days. Please schedule a follow-up in 2-3 months for ADHD management.  -SHELLFISH ALLERGY: A shellfish allergy is an immune system reaction to proteins found in shellfish, which can cause symptoms like facial swelling and eye irritation. You should continue to avoid shellfish and environments where you might be exposed to shellfish contamination.  INSTRUCTIONS: Please follow up with the new hematologist for your low white blood cell count and iron  deficiency. Continue with your current medications and dietary recommendations. Schedule a follow-up appointment in 2-3 months for ADHD management.  Your Providers PCP: Cone Bernardino MATSU, MD,  [phone]) Referring Provider: Cone Bernardino MATSU, MD,  [phone]) Care Team Provider: Gordy Channing LABOR, RN  NEXT STEPS: [x]  Early Intervention: Schedule sooner appointment, call our on-call services, or  go to emergency room if there is any significant Increase in pain or discomfort New or worsening symptoms Sudden or severe changes in your health [x]  Flexible Follow-Up: We recommend a Return in about 3 months (around 02/05/2024). for optimal routine care. This allows for progress monitoring and treatment adjustments. [x]  Preventive Care: Schedule your annual preventive care visit! It's typically covered by insurance and helps identify potential health issues early. [x]  Lab & X-ray Appointments: Incomplete tests scheduled today, or call to schedule. X-rays: [facility] (M-F, 8:30am-noon or 1pm-5pm). [x]  Medical Information Release: Sign a release form at front desk to obtain relevant medical information we don't have.  MAKING THE MOST OF OUR FOCUSED 20 MINUTE APPOINTMENTS: [x]   Clearly state your top concerns at the beginning of the visit to focus our discussion [  x]  If you anticipate you will need more time, please inform the front desk during scheduling - we can book multiple appointments in the same week. [x]   If you have transportation problems- use our convenient video appointments or ask about transportation support. [x]   We can get down to business faster if you use MyChart to update information before the visit and submit non-urgent questions before your visit. Thank you for taking the time to provide details through MyChart.  Let our nurse know and she can import this information into your encounter documents.  Arrival and Wait Times: [x]   Arriving on time ensures that everyone receives prompt attention. [x]   Early morning (8a) and afternoon (1p) appointments tend to have shortest wait times. [x]   Unfortunately, we cannot delay appointments for late arrivals or hold slots during phone calls.  Getting Answers and Following Up [x]   Simple Questions & Concerns: For quick questions or basic follow-up after your visit, reach us  at [phone] or MyChart messaging. [x]    Complex Concerns: If your concern is more complex, scheduling an appointment might be best. Discuss this with the staff to find the most suitable option. [x]   Lab & Imaging Results: We'll contact you directly if results are abnormal or you don't use MyChart. Most normal results will be on MyChart within 2-3 business days, with a review message from Dr. Jesus. Haven't heard back in 2 weeks? Need results sooner? Contact us  at [phone]. [x]   Referrals: Our referral coordinator will manage specialist referrals. The specialist's office should contact you within 2 weeks to schedule an appointment. Call us  if you haven't heard from them after 2 weeks.  Staying Connected [x]   MyChart: Activate your MyChart for the fastest way to access results and message us . See the last page of this paperwork for instructions on how to activate.  Bring to Your Next Appointment [x]   Medications: Please bring all your medication bottles to your next appointment to ensure we have an accurate record of your prescriptions. [x]   Health Diaries: If you're monitoring any health conditions at home, keeping a diary of your readings can be very helpful for discussions at your next appointment.  Billing [x]   X-ray & Lab Orders: These are billed by separate companies. Contact the invoicing company directly for questions or concerns. [x]   Visit Charges: Discuss any billing inquiries with our administrative services team.  Your Satisfaction Matters [x]   Share Your Experience: We strive for your satisfaction! If you have any complaints, or preferably compliments, please let Dr. Jesus know directly or contact our Practice Administrators, Manuelita Rubin or [employer], by asking at the front desk.   Reviewing Your Records [x]   Review this early draft of your clinical encounter notes below and the final encounter summary tomorrow on MyChart after its been completed.  All orders placed so far are visible here: Type 2  diabetes mellitus with hyperglycemia, without long-term current use of insulin  (HCC) -     Ozempic  (0.25 or 0.5 MG/DOSE); Inject 0.5 mg into the skin once a week.  Dispense: 9 mL; Refill: 3 -     CBC with Differential/Platelet -     Comprehensive metabolic panel with GFR -     Lipid panel -     Hemoglobin A1c -     Microalbumin / creatinine urine ratio  Attention or concentration deficit -     Amphetamine -Dextroamphet ER; Take 1 capsule (5 mg total) by mouth daily.  Dispense: 30 capsule; Refill: 0  Vitamin D  deficiency -     VITAMIN D  25 Hydroxy (Vit-D Deficiency, Fractures) -     Vitamin K2-Vitamin D3; Take 1 tablet by mouth daily at 6 (six) AM.  Dispense: 90 capsule; Refill: 4  Leukopenia, unspecified type  Iron  deficiency  Shellfish allergy

## 2023-11-06 DIAGNOSIS — E611 Iron deficiency: Secondary | ICD-10-CM | POA: Insufficient documentation

## 2023-11-06 DIAGNOSIS — Z91013 Allergy to seafood: Secondary | ICD-10-CM | POA: Insufficient documentation

## 2023-11-06 NOTE — Assessment & Plan Note (Signed)
 Her type 2 diabetes is well-controlled with Ozempic  0.5 mg weekly, showing significant improvement in blood glucose levels since April and reducing the risk of complications like retinopathy, nephropathy, and cardiovascular events. Her weight has decreased from 190 lbs to 148 lbs, indicating effective weight management. Although an increase to 1 mg weekly was discussed for further weight loss, she chose to remain on the current dose to avoid potential side effects such as nausea, vomiting, diarrhea, and constipation. Continue Ozempic  0.5 mg weekly with a year's supply and three refills. Perform comprehensive blood work, including a diabetes panel, and prescribe vitamin D3 with K2 to support bone health.

## 2023-11-06 NOTE — Assessment & Plan Note (Signed)
 She has a shellfish allergy with significant reactions, including facial swelling and eye symptoms upon exposure. Avoid shellfish and environments with potential shellfish contamination.

## 2023-11-06 NOTE — Assessment & Plan Note (Signed)
 ADHD is managed with Adderall 5 mg, which improves focus and functionality. She is on a regular schedule with the medication and reports improved concentration. Continue Adderall 5 mg, extend the prescription to 90 days, and schedule a follow-up in 2-3 months for ADHD management.

## 2023-11-06 NOTE — Assessment & Plan Note (Signed)
 Leukopenia is present with fluctuating white blood cell counts, possibly due to dietary insufficiency in red meat leading to low iron  and B12 levels, affecting bone marrow function. She prefers a new hematologist due to discomfort with the previous provider. Refer to a new hematologist for evaluation and encourage increased intake of lean red meat for iron  and B12.

## 2023-11-06 NOTE — Assessment & Plan Note (Signed)
 History of Iron  deficiency requiring  infusions, likely due to insufficient dietary intake of red meat, with no active bleeding reported. Discussed the importance of dietary changes to improve iron  levels. Ensure the hematology referral includes evaluation for iron  infusions and encourage increased intake of lean red meat. Lab Results  Component Value Date   IRON  98 03/12/2022   IRON  73 01/12/2022   IRON  16 (L) 11/13/2021   TIBC 261.8 03/12/2022   TIBC 246 (L) 01/12/2022   TIBC 354 11/13/2021   FERRITIN 75.9 03/12/2022   FERRITIN 78 01/12/2022   FERRITIN 8 (L) 11/13/2021   IRONPCTSAT 37.4 03/12/2022   IRONPCTSAT 30 01/12/2022   IRONPCTSAT 5 (L) 11/13/2021   MCV 85.4 11/05/2023   MCV 85.8 11/01/2023   MCV 85.8 08/24/2023   HGB 12.8 11/05/2023   HGB 13.3 11/01/2023   HGB 14.2 08/24/2023

## 2023-11-07 ENCOUNTER — Ambulatory Visit: Payer: Self-pay | Admitting: Internal Medicine

## 2023-11-07 NOTE — Progress Notes (Signed)
 Here is your data.  Overall these labs show stability and mild abnormals of no clinical significance:   Results for orders placed or performed in visit on 11/05/23  CBC with Differential/Platelet   Collection Time: 11/05/23  1:39 PM  Result Value Ref Range   WBC 3.6 (L) 4.0 - 10.5 K/uL   RBC 4.42 3.87 - 5.11 Mil/uL   Hemoglobin 12.8 12.0 - 15.0 g/dL   HCT 62.2 63.9 - 53.9 %   MCV 85.4 78.0 - 100.0 fl   MCHC 34.0 30.0 - 36.0 g/dL   RDW 86.1 88.4 - 84.4 %   Platelets 211.0 150.0 - 400.0 K/uL   Neutrophils Relative % 33.2 (L) 43.0 - 77.0 %   Lymphocytes Relative 50.8 (H) 12.0 - 46.0 %   Monocytes Relative 7.0 3.0 - 12.0 %   Eosinophils Relative 7.2 (H) 0.0 - 5.0 %   Basophils Relative 1.8 0.0 - 3.0 %   Neutro Abs 1.2 (L) 1.4 - 7.7 K/uL   Lymphs Abs 1.8 0.7 - 4.0 K/uL   Monocytes Absolute 0.3 0.1 - 1.0 K/uL   Eosinophils Absolute 0.3 0.0 - 0.7 K/uL   Basophils Absolute 0.1 0.0 - 0.1 K/uL  Comprehensive metabolic panel with GFR   Collection Time: 11/05/23  1:39 PM  Result Value Ref Range   Sodium 140 135 - 145 mEq/L   Potassium 4.2 3.5 - 5.1 mEq/L   Chloride 103 96 - 112 mEq/L   CO2 33 (H) 19 - 32 mEq/L   Glucose, Bld 84 70 - 99 mg/dL   BUN 10 6 - 23 mg/dL   Creatinine, Ser 9.41 0.40 - 1.20 mg/dL   Total Bilirubin 1.5 (H) 0.2 - 1.2 mg/dL   Alkaline Phosphatase 36 (L) 39 - 117 U/L   AST 14 0 - 37 U/L   ALT 9 0 - 35 U/L   Total Protein 6.4 6.0 - 8.3 g/dL   Albumin  4.0 3.5 - 5.2 g/dL   GFR 894.19 >39.99 mL/min   Calcium 9.2 8.4 - 10.5 mg/dL  Lipid panel   Collection Time: 11/05/23  1:39 PM  Result Value Ref Range   Cholesterol 135 0 - 200 mg/dL   Triglycerides 45.9 0.0 - 149.0 mg/dL   HDL 53.19 >60.99 mg/dL   VLDL 89.1 0.0 - 59.9 mg/dL   LDL Cholesterol 78 0 - 99 mg/dL   Total CHOL/HDL Ratio 3    NonHDL 88.33   Hemoglobin A1c   Collection Time: 11/05/23  1:39 PM  Result Value Ref Range   Hgb A1c MFr Bld 5.8 4.6 - 6.5 %  Vitamin D  (25 hydroxy)   Collection Time:  11/05/23  1:39 PM  Result Value Ref Range   VITD 42.58 30.00 - 100.00 ng/mL   Lab Results  Component Value Date   HGBA1C 5.8 11/05/2023   HGBA1C 6.5 (A) 08/11/2023   HGBA1C 11.4 (H) 05/10/2023   HGBA1C 7.0 (H) 01/26/2023   HGBA1C 6.5 08/11/2022   HGBA1C 6.8 (H) 03/12/2022   HGBA1C 11.0 (A) 12/05/2021   HGBA1C 10.3 Repeated and verified X2. (H) 12/05/2021     Lab Results  Component Value Date   BILITOT 1.5 (H) 11/05/2023   BILITOT 1.6 (H) 08/24/2023   BILITOT 1.3 (H) 05/10/2023   BILITOT 1.2 01/26/2023   BILITOT 1.5 (H) 08/11/2022   BILITOT 1.7 (H) 03/12/2022   BILITOT 1.2 01/12/2022   BILITOT 1.0 06/16/2021   BILITOT 0.9 10/10/2020   BILITOT 1.5 (H) 12/31/2015   BILITOT 1.0  12/30/2015   BILITOT 0.5 08/04/2015   BILITOT 0.76 05/27/2015   BILITOT 1.0 04/22/2012   Lab Results  Component Value Date   ALKPHOS 36 (L) 11/05/2023   ALKPHOS 40 08/24/2023   ALKPHOS 61 05/10/2023   ALKPHOS 46 01/26/2023   ALKPHOS 49 08/11/2022   ALKPHOS 50 03/12/2022   ALKPHOS 48 01/12/2022   ALKPHOS 56 06/16/2021   ALKPHOS 44 10/10/2020   ALKPHOS 30 (L) 12/31/2015    Lab Results  Component Value Date   WBC 3.6 (L) 11/05/2023   WBC 3.7 (L) 11/01/2023   WBC 4.2 08/24/2023   WBC 4.4 05/10/2023   WBC 3.7 (L) 01/26/2023   WBC 3.7 (L) 08/11/2022   WBC 3.6 (L) 03/12/2022   WBC 3.3 (L) 01/12/2022   WBC 4.1 12/06/2021   WBC 4.3 12/05/2021   WBC 3.5 (L) 11/13/2021   WBC 6.4 10/09/2021   WBC 6.8 10/09/2021   WBC 6.3 10/08/2021   WBC 4.4 10/08/2021   WBC 4.0 08/14/2021   WBC 3.9 (L) 06/16/2021   WBC 4.0 06/06/2021   WBC 4.6 10/10/2020   WBC 4.0 08/19/2020   WBC 2.5 (L) 06/26/2020   WBC 8.6 06/13/2019   WBC 6.1 06/12/2019   WBC 8.1 01/01/2016   WBC 10.8 (H) 12/31/2015   WBC 11.7 (H) 12/30/2015   WBC 11.9 (H) 12/30/2015   WBC 4.4 08/04/2015   WBC 3.5 (L) 05/27/2015   WBC 11.3 (H) 03/10/2014   WBC 3.9 (L) 04/22/2012   WBC 9.7 12/26/2006   WBC 5.1 12/25/2006   WBC 8.9  10/01/2006   WBC 6.4 07/13/2006

## 2023-11-09 ENCOUNTER — Ambulatory Visit: Admitting: Internal Medicine

## 2023-11-10 ENCOUNTER — Ambulatory Visit: Admitting: Internal Medicine

## 2023-11-10 ENCOUNTER — Other Ambulatory Visit: Payer: Self-pay

## 2023-11-12 ENCOUNTER — Other Ambulatory Visit: Payer: Self-pay

## 2023-11-12 ENCOUNTER — Other Ambulatory Visit (HOSPITAL_BASED_OUTPATIENT_CLINIC_OR_DEPARTMENT_OTHER): Payer: Self-pay

## 2023-11-16 ENCOUNTER — Other Ambulatory Visit: Payer: Self-pay

## 2023-11-23 ENCOUNTER — Telehealth: Payer: Self-pay

## 2023-11-23 NOTE — Telephone Encounter (Signed)
 Tried to call [facility] due to referral being cancelled could not get anyone left message for them to call back here or pt due to pt has done tried to call them couple times and no call back.

## 2023-11-25 ENCOUNTER — Ambulatory Visit

## 2023-11-25 ENCOUNTER — Other Ambulatory Visit

## 2023-11-25 ENCOUNTER — Other Ambulatory Visit: Admitting: Obstetrics and Gynecology

## 2023-11-25 DIAGNOSIS — R102 Pelvic and perineal pain unspecified side: Secondary | ICD-10-CM

## 2023-12-02 ENCOUNTER — Other Ambulatory Visit (HOSPITAL_BASED_OUTPATIENT_CLINIC_OR_DEPARTMENT_OTHER): Payer: Self-pay

## 2023-12-03 ENCOUNTER — Institutional Professional Consult (permissible substitution) (INDEPENDENT_AMBULATORY_CARE_PROVIDER_SITE_OTHER): Admitting: Otolaryngology

## 2023-12-08 ENCOUNTER — Ambulatory Visit (INDEPENDENT_AMBULATORY_CARE_PROVIDER_SITE_OTHER): Admitting: Otolaryngology

## 2023-12-08 ENCOUNTER — Other Ambulatory Visit (HOSPITAL_BASED_OUTPATIENT_CLINIC_OR_DEPARTMENT_OTHER): Payer: Self-pay

## 2023-12-08 ENCOUNTER — Encounter (INDEPENDENT_AMBULATORY_CARE_PROVIDER_SITE_OTHER): Payer: Self-pay | Admitting: Otolaryngology

## 2023-12-08 VITALS — BP 118/78 | HR 77 | Temp 98.3°F | Ht 64.0 in | Wt 102.0 lb

## 2023-12-08 DIAGNOSIS — J329 Chronic sinusitis, unspecified: Secondary | ICD-10-CM | POA: Diagnosis not present

## 2023-12-08 DIAGNOSIS — J3089 Other allergic rhinitis: Secondary | ICD-10-CM

## 2023-12-08 DIAGNOSIS — J324 Chronic pansinusitis: Secondary | ICD-10-CM

## 2023-12-08 MED ORDER — AMOXICILLIN-POT CLAVULANATE 875-125 MG PO TABS
1.0000 | ORAL_TABLET | Freq: Two times a day (BID) | ORAL | 0 refills | Status: AC
  Filled 2023-12-08: qty 28, 14d supply, fill #0

## 2023-12-08 NOTE — Progress Notes (Signed)
 Reason for Consult: Chronic sinusitis Referring Physician: dr jesus Roseline [name] is an 48 y.o. female.  HPI: History of chronic nasal congestion and obstruction for many years.  She has been treated with multiple antihistamines and nasal steroid sprays.  She has tried saline irrigation.  She has purulent drainage at times.  She has some pressure and headaches occasionally.  She states that headaches can are isolated into the facial sinus area.  There is been no swelling or facial erythema.  No eye changes.  She has been on amoxicillin  several times this last 6 months.  Past Medical History:  Diagnosis Date   Anemia    Asthma    Blood transfusion without reported diagnosis    Diverticulosis    Gestational diabetes    Postoperative anemia due to acute blood loss 10/09/2021   Lab Results      Component    Value    Date/Time           MCV    87.3    01/26/2023 10:16 AM           MCV    87.4    08/11/2022 02:15 PM           MCV    84.8    03/12/2022 12:08 PM           MCV    81.9    01/12/2022 10:48 AM           MCV    80.7    12/06/2021 07:49 PM           MCV    75.8 (L)    05/27/2015 02:41 PM             Lab Results      Component    Value    Date/Time           HGB      Type 2 diabetes mellitus (HCC)     Past Surgical History:  Procedure Laterality Date   arm surgery     CERVICAL CERCLAGE     CESAREAN SECTION     DILATION AND CURETTAGE OF UTERUS     HYSTERECTOMY ABDOMINAL WITH SALPINGECTOMY Bilateral 10/08/2021   Procedure: ABDOMINAL HYSTERECTOMY WITH BILATERAL  SALPINGECTOMY;  Surgeon: Cleatus Moccasin, MD;  Location: [facility] OR;  Service: Gynecology;  Laterality: Bilateral;   IR RADIOLOGIST EVAL & MGMT  09/04/2021   TOOTH EXTRACTION      Family History  Problem Relation Age of Onset   Hypertension Mother    Hypertension Brother    Breast cancer Maternal Aunt    Hypertension Maternal Uncle     Social History:  reports that she has never smoked. She has never used smokeless tobacco.  She reports that she does not drink alcohol and does not use drugs.  Allergies:  Allergies  Allergen Reactions   Shellfish Allergy Itching and Swelling    Throat swells    Iodinated Contrast Media Itching   Other Itching and Swelling    Hazelnut. Swelling of throat    Latex Itching, Other (See Comments), Rash and Dermatitis    Burning  Other Reaction(s): Other (See Comments)    Burning    Medications: I have reviewed the patient's current medications.  No results found for this or any previous visit (from the past 48 hours).  No results found.  ROS Blood pressure 118/78, pulse 77, temperature 98.3 F (36.8 C), height 5' 4 (1.626 m), weight  102 lb (46.3 kg), last menstrual period 08/13/2021, SpO2 98%. Physical Exam Constitutional:      Appearance: Normal appearance.  HENT:     Head: Normocephalic and atraumatic.     Right Ear: Tympanic membrane is without lesions and middle ear aerated, ear canal and external ear normal.     Left Ear: Tympanic membrane is without lesions and middle ear aerated, ear canal and external ear normal.     Nose: Nose normal. Turbinates with mild hypertrophy, No significant swelling or masses.     Oral cavity/oropharynx: Mucous membranes are moist. No lesions or masses    Larynx: normal voice. Mirror attempted without success    Eyes:     Extraocular Movements: Extraocular movements intact.     Conjunctiva/sclera: Conjunctivae normal.     Pupils: Pupils are equal, round, and reactive to light.  Cardiovascular:     Rate and Rhythm: Normal rate.  Pulmonary:     Effort: Pulmonary effort is normal.  Musculoskeletal:     Cervical back: Normal range of motion and neck supple. No rigidity.  Lymphadenopathy:     Cervical: No cervical adenopathy or masses.salivary glands without lesions. .  Neurological:     Mental Status: He is alert. CN 2-12 intact. No nystagmus      Assessment/Plan: Chronic sinusitis-she definitely has longstanding issues  with purulent constant nasal obstruction.  She has tried amoxicillin  once this year and she uses nasal steroid sprays and saline.  I think we should use Augmentin  for 14 days.  She will continue the nasal steroid spray.  In about 3 weeks we will go ahead and proceed with a CT scan since she has already had treatment and see if she does indeed have chronic sinus disease.  She is in agreement with this plan and will call sooner if she is any worse.  [name] Notice 12/08/2023, 11:09 AM

## 2023-12-24 NOTE — Progress Notes (Unsigned)
 [facility] CONSULT NOTE  Patient Care Team: Jesus Bernardino MATSU, MD as PCP - General (Internal Medicine) Gordy Channing LABOR, RN as [facility] Care Management   ASSESSMENT & PLAN 48 y.o.female with history of type 2 diabetes, chronic nasal congestion, iron  deficiency, depression referred for decreased white blood cell counts.  Labs show borderline leukopenia and neutropenia.  Previous history showed iron  deficiency.  Patient attributes a fibroid which has been removed.  MCV is mid normal range.  Hemoglobin was normal.  We discussed differential diagnosis today.  She has a recent sinusitis like infection and completed a course of amoxicillin .  Patient reports improvement of symptoms in the interim.  Pending additional workup by ENT.  Reported chronic sinusitis.  We discussed infectious process may cause abnormal blood counts.  No signs of rheumatoid arthritis, rash, unexpected red flag symptoms.  Will repeat labs today.  If no concerning findings, patient may follow-up as needed.  Otherwise, with stable chronic borderline leukopenia without any concerning symptoms, consider follow-up every 6 months.  Of note, reports she has not had a colonoscopy.  There is a GI referral.  Recommend follow-up with GI given her age of 67 need for colonoscopy. Assessment & Plan Leukopenia, unspecified type Repeat labs today If stable ok to see PCP every 6 months. Return if new concerns. History of iron  deficiency anemia Repeat ferritin and iron  panel  Orders Placed This Encounter  Procedures   CBC with Differential (Cancer Center Only)    Standing Status:   Future    Number of Occurrences:   1    Expiration Date:   12/26/2024   Iron  and Iron  Binding Capacity ([facility] only)    Standing Status:   Future    Number of Occurrences:   1    Expiration Date:   12/26/2024   Ferritin    Standing Status:   Future    Number of Occurrences:   1    Expiration Date:   12/26/2024   Vitamin B12    Standing  Status:   Future    Number of Occurrences:   1    Expiration Date:   12/26/2024   Folate    Standing Status:   Future    Number of Occurrences:   1    Expiration Date:   12/26/2024   All questions were answered. The patient knows to call the clinic with any problems, questions or concerns.  [name], MD 12/27/2023 2:10 PM   CHIEF COMPLAINTS/PURPOSE OF CONSULTATION:  Leukopenia   HISTORY OF PRESENTING ILLNESS:  [name] 48 y.o. female is here because of decreased WBC. Records show chronic borderline leukopenia, borderline neutropenia, microcytosis.  No deficiency in B12 and folate.  Creatinine and LFTs were normal.  Records show history of iron  deficiency anemia.  Last received IV Venofer  in 2023.  From 11/05/2023 creatinine was 0.58 AST 14 ALT 9 alkaline phosphatase 36.  Total bilirubin 1.5.  Total protein 6.4.  WBC 3.6 MCV 85 hemoglobin 13.  Platelet 211.  ANC 1.2 ALC 1.8.  Absolute eosinophil 0.3.  Last iron  study from home health was in February 2024.  Transferrin was decreased.  Ferritin was normal at 76.  Folate from April 2025 was normal.  B12 was elevated.  Report she has chronic nasal drainage, stuffy nose, running nose.  She has seen ENT and trial of amoxicillin  and symptoms improved. Report she always has allergy. No fever, drenching night sweats, loss of appetite, unexpected weight loss, mass  or mass.  Report of asthma and allergy growing up.   No family history of immunodeficiency or bone marrow failure syndrome.  Report not consume fruit and vegetable daily. She was taking multivitamin. No history of autoimmune disorders, such as lupus or RA.  No risk factors for HIV or hepatitis. No heavy alcohol use in the past. No chest pain, difficulty breath No new drugs other than amoxicillin . No other drugs.  Report history of fibroid heavy bleeding and not having bleeding anymore.  MEDICAL HISTORY:  Past Medical History:  Diagnosis Date   Anemia    Asthma     Blood transfusion without reported diagnosis    Diverticulosis    Gestational diabetes    Postoperative anemia due to acute blood loss 10/09/2021   Lab Results      Component    Value    Date/Time           MCV    87.3    01/26/2023 10:16 AM           MCV    87.4    08/11/2022 02:15 PM           MCV    84.8    03/12/2022 12:08 PM           MCV    81.9    01/12/2022 10:48 AM           MCV    80.7    12/06/2021 07:49 PM           MCV    75.8 (L)    05/27/2015 02:41 PM             Lab Results      Component    Value    Date/Time           HGB      Type 2 diabetes mellitus (HCC)     SURGICAL HISTORY: Past Surgical History:  Procedure Laterality Date   arm surgery     CERVICAL CERCLAGE     CESAREAN SECTION     DILATION AND CURETTAGE OF UTERUS     HYSTERECTOMY ABDOMINAL WITH SALPINGECTOMY Bilateral 10/08/2021   Procedure: ABDOMINAL HYSTERECTOMY WITH BILATERAL  SALPINGECTOMY;  Surgeon: Cleatus Moccasin, MD;  Location: [facility] OR;  Service: Gynecology;  Laterality: Bilateral;   IR RADIOLOGIST EVAL & MGMT  09/04/2021   TOOTH EXTRACTION      SOCIAL HISTORY: Social History   Socioeconomic History   Marital status: Single    Spouse name: Not on file   Number of children: 2   Years of education: Not on file   Highest education level: Not on file  Occupational History   Not on file  Tobacco Use   Smoking status: Never   Smokeless tobacco: Never  Vaping Use   Vaping status: Never Used  Substance and Sexual Activity   Alcohol use: No   Drug use: No   Sexual activity: Not Currently    Birth control/protection: Condom  Other Topics Concern   Not on file  Social History Narrative   Not on file   Social Drivers of Health   Financial Resource Strain: Not on file  Food Insecurity: Food Insecurity Present (12/27/2023)   Hunger Vital Sign    Worried About Running Out of Food in the Last Year: Often true    Ran Out of Food in the Last Year: Often true  Transportation Needs: Unmet Transportation  Needs (12/27/2023)   PRAPARE - Transportation  Lack of Transportation (Medical): Yes    Lack of Transportation (Non-Medical): Yes  Physical Activity: Not on file  Stress: Not on file  Social Connections: Not on file  Intimate Partner Violence: Not At Risk (12/27/2023)   Humiliation, Afraid, Rape, and Kick questionnaire    Fear of Current or Ex-Partner: No    Emotionally Abused: No    Physically Abused: No    Sexually Abused: No    FAMILY HISTORY: Family History  Problem Relation Age of Onset   Hypertension Mother    Hypertension Brother    Breast cancer Maternal Aunt    Hypertension Maternal Uncle     ALLERGIES:  is allergic to shellfish allergy, iodinated contrast media, other, and latex.  MEDICATIONS:  Current Outpatient Medications  Medication Sig Dispense Refill   Accu-Chek Softclix Lancets lancets Use as instructed 100 each 12   albuterol  (VENTOLIN  HFA) 108 (90 Base) MCG/ACT inhaler Inhale 2 puffs into the lungs every 6 (six) hours as needed for wheezing or shortness of breath. 36 g 99   amphetamine -dextroamphetamine  (ADDERALL XR) 5 MG 24 hr capsule Take 1 capsule (5 mg total) by mouth daily. 30 capsule 0   amphetamine -dextroamphetamine  (ADDERALL XR) 5 MG 24 hr capsule Take 1 capsule (5 mg total) by mouth daily. (Patient not taking: Reported on 11/01/2023) 30 capsule 0   Blood Glucose Monitoring Suppl (ACCU-CHEK GUIDE) w/Device KIT Use to test blood sugar daily 1 kit 1   Calcium Carb-Cholecalciferol  (CALCIUM + D3 PO) Take 1 tablet by mouth daily. When able to remember (Patient not taking: Reported on 11/01/2023)     Cholecalciferol  (VITAMIN D3) 125 MCG (5000 UT) CAPS Take 1 capsule (5,000 Units total) by mouth daily. (Patient not taking: Reported on 11/01/2023) 90 capsule 3   ciclopirox  (LOPROX ) 0.77 % cream Apply 1 Application topically 2 (two) times daily. 90 g 1   ciclopirox  (PENLAC ) 8 % solution Apply topically at bedtime. Apply over nail and surrounding skin. Apply daily  over previous coat. After seven (7) days, may remove with alcohol and continue cycle. 6.6 mL 0   clindamycin  (CLINDAGEL ) 1 % gel Apply topically 2 (two) times daily. 30 g 1   Cyanocobalamin  (B-12) 1000 MCG TBCR Take 1,000 mcg by mouth daily. When able to remember 100 tablet 3   dapagliflozin  propanediol (FARXIGA ) 5 MG TABS tablet Take 1 tablet (5 mg total) by mouth daily before breakfast. (Patient not taking: Reported on 11/01/2023) 90 tablet 3   escitalopram  (LEXAPRO ) 10 MG tablet Take 1 tablet (10 mg total) by mouth daily. Take half tablet for first 2 weeks to start. 30 tablet 1   famotidine  (PEPCID ) 20 MG tablet Take 1 tablet (20 mg total) by mouth 2 (two) times daily. 30 tablet 6   fluticasone  (FLONASE ) 50 MCG/ACT nasal spray Place 1 spray into both nostrils in the morning and at bedtime. (Patient not taking: Reported on 11/01/2023) 16 g 5   fluticasone  (FLONASE ) 50 MCG/ACT nasal spray Place 2 sprays into both nostrils daily. 16 g 6   glucose blood (ACCU-CHEK GUIDE TEST) test strip Use to test blood sugar daily 100 each 12   glucose blood (ACCU-CHEK GUIDE) test strip daily 100 each 3   glucose blood test strip Use as instructed 100 each 12   Lancets (FREESTYLE) lancets Use as instructed (Patient taking differently: 1 each by Other route as needed. Use as instructed Using only PRN) 100 each 12   levocetirizine (XYZAL ) 5 MG tablet Take 1 tablet (5 mg  total) by mouth every evening. 90 tablet 3   loratadine  (CLARITIN ) 10 MG tablet Take 1 tablet (10 mg total) by mouth daily. 30 tablet 11   Multiple Vitamins-Minerals (AZO HORMONAL HEALTH CYCLE CARE) TABS Take by mouth. (Patient not taking: Reported on 11/01/2023)     nystatin -triamcinolone  ointment (MYCOLOG) Apply 1 Application topically 2 (two) times daily. 30 g 0   ondansetron  (ZOFRAN -ODT) 4 MG disintegrating tablet Dissolve 1 tablet under the tongue every 8 (eight) hours as needed for nausea or vomiting. 20 tablet 2   pseudoephedrine  (SUDAFED) 120 MG  12 hr tablet Take 1 tablet (120 mg total) by mouth 2 (two) times daily. 20 tablet 0   Saline (SIMPLY SALINE) 0.9 % AERS Place 1 spray into the nose daily at 6 (six) AM. (Patient not taking: Reported on 11/01/2023) 127 mL 11   Saline (SIMPLY SALINE) 0.9 % AERS Place 2 each into the nose as directed. Use nightly for sinus hygiene long-term.  Can also be used as many times daily as desired to assist with clearing congested sinuses. 127 mL 11   Semaglutide ,0.25 or 0.5MG /DOS, (OZEMPIC , 0.25 OR 0.5 MG/DOSE,) 2 MG/3ML SOPN Inject 0.5 mg into the skin once a week. 9 mL 3   triamcinolone  cream (KENALOG ) 0.1 % Apply 1 Application topically 2 (two) times daily. 30 g 0   Vitamin D , Ergocalciferol , (DRISDOL ) 1.25 MG (50000 UNIT) CAPS capsule Take 1 capsule (50,000 Units total) by mouth every 7 (seven) days. 12 capsule 1   Vitamin D -Vitamin K (VITAMIN K2 -VITAMIN D3) 90-125 MCG CAPS Take 1 tablet by mouth daily at 6 (six) AM. 90 capsule 4   No current facility-administered medications for this visit.    REVIEW OF SYSTEMS:   All relevant systems were reviewed with the patient and are negative.  PHYSICAL EXAMINATION:  Vitals:   12/27/23 1206  BP: 133/69  Pulse: 76  Resp: 17  Temp: 98.6 F (37 C)  SpO2: 100%   Filed Weights   12/27/23 1206  Weight: 148 lb 11.2 oz (67.4 kg)    GENERAL: alert, no distress and comfortable LYMPH:  no palpable cervical, axillary lymphadenopathy  LUNGS: clear to auscultation and percussion with normal breathing effort HEART: regular rate & rhythm and no murmurs  ABDOMEN: abdomen soft, non-tender and nondistended. Musculoskeletal: no edema.  No swelling in joints.   LABORATORY DATA:  I have reviewed the data as listed Recent Results (from the past 2160 hours)  POC COVID-19 BinaxNow     Status: Normal   Collection Time: 10/15/23  9:54 PM  Result Value Ref Range   SARS Coronavirus 2 Ag Negative Negative  CBC with Differential/Platelet     Status: Abnormal    Collection Time: 11/01/23  3:32 PM  Result Value Ref Range   WBC 3.7 (L) 4.0 - 10.5 K/uL   RBC 4.63 3.87 - 5.11 Mil/uL   Hemoglobin 13.3 12.0 - 15.0 g/dL   HCT 60.2 63.9 - 53.9 %   MCV 85.8 78.0 - 100.0 fl   MCHC 33.5 30.0 - 36.0 g/dL   RDW 86.1 88.4 - 84.4 %   Platelets 220.0 150.0 - 400.0 K/uL   Neutrophils Relative % 36.9 (L) 43.0 - 77.0 %   Lymphocytes Relative 47.3 (H) 12.0 - 46.0 %   Monocytes Relative 7.8 3.0 - 12.0 %   Eosinophils Relative 6.5 (H) 0.0 - 5.0 %   Basophils Relative 1.5 0.0 - 3.0 %   Neutro Abs 1.4 1.4 - 7.7 K/uL  Lymphs Abs 1.8 0.7 - 4.0 K/uL   Monocytes Absolute 0.3 0.1 - 1.0 K/uL   Eosinophils Absolute 0.2 0.0 - 0.7 K/uL   Basophils Absolute 0.1 0.0 - 0.1 K/uL  CBC with Differential/Platelet     Status: Abnormal   Collection Time: 11/05/23  1:39 PM  Result Value Ref Range   WBC 3.6 (L) 4.0 - 10.5 K/uL   RBC 4.42 3.87 - 5.11 Mil/uL   Hemoglobin 12.8 12.0 - 15.0 g/dL   HCT 62.2 63.9 - 53.9 %   MCV 85.4 78.0 - 100.0 fl   MCHC 34.0 30.0 - 36.0 g/dL   RDW 86.1 88.4 - 84.4 %   Platelets 211.0 150.0 - 400.0 K/uL   Neutrophils Relative % 33.2 (L) 43.0 - 77.0 %   Lymphocytes Relative 50.8 (H) 12.0 - 46.0 %   Monocytes Relative 7.0 3.0 - 12.0 %   Eosinophils Relative 7.2 (H) 0.0 - 5.0 %   Basophils Relative 1.8 0.0 - 3.0 %   Neutro Abs 1.2 (L) 1.4 - 7.7 K/uL   Lymphs Abs 1.8 0.7 - 4.0 K/uL   Monocytes Absolute 0.3 0.1 - 1.0 K/uL   Eosinophils Absolute 0.3 0.0 - 0.7 K/uL   Basophils Absolute 0.1 0.0 - 0.1 K/uL  Comprehensive metabolic panel with GFR     Status: Abnormal   Collection Time: 11/05/23  1:39 PM  Result Value Ref Range   Sodium 140 135 - 145 mEq/L   Potassium 4.2 3.5 - 5.1 mEq/L   Chloride 103 96 - 112 mEq/L   CO2 33 (H) 19 - 32 mEq/L   Glucose, Bld 84 70 - 99 mg/dL   BUN 10 6 - 23 mg/dL   Creatinine, Ser 9.41 0.40 - 1.20 mg/dL   Total Bilirubin 1.5 (H) 0.2 - 1.2 mg/dL   Alkaline Phosphatase 36 (L) 39 - 117 U/L   AST 14 0 - 37 U/L   ALT  9 0 - 35 U/L   Total Protein 6.4 6.0 - 8.3 g/dL   Albumin  4.0 3.5 - 5.2 g/dL   GFR 894.19 >39.99 mL/min    Comment: Calculated using the CKD-EPI Creatinine Equation (2021)   Calcium 9.2 8.4 - 10.5 mg/dL  Lipid panel     Status: None   Collection Time: 11/05/23  1:39 PM  Result Value Ref Range   Cholesterol 135 0 - 200 mg/dL    Comment: ATP III Classification       Desirable:  < 200 mg/dL               Borderline High:  200 - 239 mg/dL          High:  > = 759 mg/dL   Triglycerides 45.9 0.0 - 149.0 mg/dL    Comment: Normal:  <849 mg/dLBorderline High:  150 - 199 mg/dL   HDL 53.19 >60.99 mg/dL   VLDL 89.1 0.0 - 59.9 mg/dL   LDL Cholesterol 78 0 - 99 mg/dL   Total CHOL/HDL Ratio 3     Comment:                Men          Women1/2 Average Risk     3.4          3.3Average Risk          5.0          4.42X Average Risk          9.6  7.13X Average Risk          15.0          11.0                       NonHDL 88.33     Comment: NOTE:  Non-HDL goal should be 30 mg/dL higher than patient's LDL goal (i.e. LDL goal of < 70 mg/dL, would have non-HDL goal of < 100 mg/dL)  Hemoglobin J8r     Status: None   Collection Time: 11/05/23  1:39 PM  Result Value Ref Range   Hgb A1c MFr Bld 5.8 4.6 - 6.5 %    Comment: Glycemic Control Guidelines for People with Diabetes:Non Diabetic:  <6%Goal of Therapy: <7%Additional Action Suggested:  >8%   Vitamin D  (25 hydroxy)     Status: None   Collection Time: 11/05/23  1:39 PM  Result Value Ref Range   VITD 42.58 30.00 - 100.00 ng/mL  Iron  and Iron  Binding Capacity ([facility] only)     Status: None   Collection Time: 12/27/23  1:07 PM  Result Value Ref Range   Iron  57 28 - 170 ug/dL   TIBC 733 749 - 549 ug/dL   Saturation Ratios 21 10.4 - 31.8 %   UIBC 209 ug/dL    Comment: Performed at [facility], [address] [place], [place] [address]  CBC with Differential (Cancer Center Only)     Status: Abnormal   Collection Time:  12/27/23  1:07 PM  Result Value Ref Range   WBC Count 4.1 4.0 - 10.5 K/uL   RBC 4.66 3.87 - 5.11 MIL/uL   Hemoglobin 13.5 12.0 - 15.0 g/dL   HCT 59.9 63.9 - 53.9 %   MCV 85.8 80.0 - 100.0 fL   MCH 29.0 26.0 - 34.0 pg   MCHC 33.8 30.0 - 36.0 g/dL   RDW 86.8 88.4 - 84.4 %   Platelet Count 198 150 - 400 K/uL   nRBC 0.0 0.0 - 0.2 %   Neutrophils Relative % 40 %   Neutro Abs 1.6 (L) 1.7 - 7.7 K/uL   Lymphocytes Relative 47 %   Lymphs Abs 1.9 0.7 - 4.0 K/uL   Monocytes Relative 7 %   Monocytes Absolute 0.3 0.1 - 1.0 K/uL   Eosinophils Relative 5 %   Eosinophils Absolute 0.2 0.0 - 0.5 K/uL   Basophils Relative 1 %   Basophils Absolute 0.1 0.0 - 0.1 K/uL   Immature Granulocytes 0 %   Abs Immature Granulocytes 0.00 0.00 - 0.07 K/uL    Comment: Performed at [facility], [address], [place], [place] [address]

## 2023-12-27 ENCOUNTER — Inpatient Hospital Stay

## 2023-12-27 VITALS — BP 133/69 | HR 76 | Temp 98.6°F | Resp 17 | Wt 148.7 lb

## 2023-12-27 DIAGNOSIS — Z9079 Acquired absence of other genital organ(s): Secondary | ICD-10-CM | POA: Diagnosis not present

## 2023-12-27 DIAGNOSIS — Z90710 Acquired absence of both cervix and uterus: Secondary | ICD-10-CM | POA: Insufficient documentation

## 2023-12-27 DIAGNOSIS — D72819 Decreased white blood cell count, unspecified: Secondary | ICD-10-CM

## 2023-12-27 DIAGNOSIS — E119 Type 2 diabetes mellitus without complications: Secondary | ICD-10-CM | POA: Insufficient documentation

## 2023-12-27 DIAGNOSIS — Z5941 Food insecurity: Secondary | ICD-10-CM | POA: Diagnosis not present

## 2023-12-27 DIAGNOSIS — Z803 Family history of malignant neoplasm of breast: Secondary | ICD-10-CM | POA: Diagnosis not present

## 2023-12-27 DIAGNOSIS — Z7985 Long-term (current) use of injectable non-insulin antidiabetic drugs: Secondary | ICD-10-CM | POA: Diagnosis not present

## 2023-12-27 DIAGNOSIS — Z79899 Other long term (current) drug therapy: Secondary | ICD-10-CM | POA: Insufficient documentation

## 2023-12-27 DIAGNOSIS — Z5982 Transportation insecurity: Secondary | ICD-10-CM | POA: Diagnosis not present

## 2023-12-27 DIAGNOSIS — D709 Neutropenia, unspecified: Secondary | ICD-10-CM | POA: Insufficient documentation

## 2023-12-27 DIAGNOSIS — F32A Depression, unspecified: Secondary | ICD-10-CM | POA: Diagnosis not present

## 2023-12-27 DIAGNOSIS — J45909 Unspecified asthma, uncomplicated: Secondary | ICD-10-CM | POA: Diagnosis not present

## 2023-12-27 DIAGNOSIS — Z8249 Family history of ischemic heart disease and other diseases of the circulatory system: Secondary | ICD-10-CM | POA: Diagnosis not present

## 2023-12-27 DIAGNOSIS — Z91040 Latex allergy status: Secondary | ICD-10-CM | POA: Diagnosis not present

## 2023-12-27 DIAGNOSIS — Z91041 Radiographic dye allergy status: Secondary | ICD-10-CM | POA: Diagnosis not present

## 2023-12-27 DIAGNOSIS — Z862 Personal history of diseases of the blood and blood-forming organs and certain disorders involving the immune mechanism: Secondary | ICD-10-CM

## 2023-12-27 DIAGNOSIS — J329 Chronic sinusitis, unspecified: Secondary | ICD-10-CM | POA: Diagnosis not present

## 2023-12-27 DIAGNOSIS — R0981 Nasal congestion: Secondary | ICD-10-CM | POA: Diagnosis not present

## 2023-12-27 DIAGNOSIS — Z8632 Personal history of gestational diabetes: Secondary | ICD-10-CM | POA: Diagnosis not present

## 2023-12-27 LAB — CBC WITH DIFFERENTIAL (CANCER CENTER ONLY)
Abs Immature Granulocytes: 0 K/uL (ref 0.00–0.07)
Basophils Absolute: 0.1 K/uL (ref 0.0–0.1)
Basophils Relative: 1 %
Eosinophils Absolute: 0.2 K/uL (ref 0.0–0.5)
Eosinophils Relative: 5 %
HCT: 40 % (ref 36.0–46.0)
Hemoglobin: 13.5 g/dL (ref 12.0–15.0)
Immature Granulocytes: 0 %
Lymphocytes Relative: 47 %
Lymphs Abs: 1.9 K/uL (ref 0.7–4.0)
MCH: 29 pg (ref 26.0–34.0)
MCHC: 33.8 g/dL (ref 30.0–36.0)
MCV: 85.8 fL (ref 80.0–100.0)
Monocytes Absolute: 0.3 K/uL (ref 0.1–1.0)
Monocytes Relative: 7 %
Neutro Abs: 1.6 K/uL — ABNORMAL LOW (ref 1.7–7.7)
Neutrophils Relative %: 40 %
Platelet Count: 198 K/uL (ref 150–400)
RBC: 4.66 MIL/uL (ref 3.87–5.11)
RDW: 13.1 % (ref 11.5–15.5)
WBC Count: 4.1 K/uL (ref 4.0–10.5)
nRBC: 0 % (ref 0.0–0.2)

## 2023-12-27 LAB — FOLATE: Folate: 12.8 ng/mL (ref >5.9)

## 2023-12-27 LAB — IRON AND IRON BINDING CAPACITY (CC-WL,HP ONLY)
Iron: 57 ug/dL (ref 28–170)
Saturation Ratios: 21 % (ref 10.4–31.8)
TIBC: 266 ug/dL (ref 250–450)
UIBC: 209 ug/dL

## 2023-12-27 LAB — VITAMIN B12: Vitamin B-12: 1680 pg/mL — ABNORMAL HIGH (ref 180–914)

## 2023-12-27 LAB — FERRITIN: Ferritin: 174 ng/mL (ref 11–307)

## 2023-12-27 NOTE — Assessment & Plan Note (Addendum)
 Repeat labs today If stable ok to see PCP every 6 months. Return if new concerns.

## 2023-12-28 ENCOUNTER — Other Ambulatory Visit (HOSPITAL_BASED_OUTPATIENT_CLINIC_OR_DEPARTMENT_OTHER): Payer: Self-pay

## 2023-12-28 ENCOUNTER — Ambulatory Visit: Payer: Self-pay

## 2023-12-28 NOTE — Telephone Encounter (Signed)
-----   Message from [name] sent at 12/28/2023 10:14 AM EST ----- Samba Cumba please let her know blood counts were fine. No concerning changes or findings. She can follow up with PCP and check about every 6 - 12 months. Follow up with us  as needed. Thank you ----- Message ----- From: Rebecka, [facility] [place] Sent: 12/27/2023   1:18 PM EST To: [name], MD

## 2023-12-28 NOTE — Telephone Encounter (Signed)
 LM to call Dr Nelta Numbers nurse

## 2023-12-28 NOTE — Telephone Encounter (Signed)
 Notified of message below

## 2024-01-14 ENCOUNTER — Other Ambulatory Visit (HOSPITAL_BASED_OUTPATIENT_CLINIC_OR_DEPARTMENT_OTHER): Payer: Self-pay

## 2024-02-08 ENCOUNTER — Ambulatory Visit: Admitting: Internal Medicine

## 2024-02-11 ENCOUNTER — Telehealth: Payer: Self-pay | Admitting: Internal Medicine

## 2024-02-11 ENCOUNTER — Ambulatory Visit: Admitting: Internal Medicine

## 2024-02-11 ENCOUNTER — Encounter: Payer: Self-pay | Admitting: Internal Medicine

## 2024-02-11 VITALS — Ht 64.0 in

## 2024-02-11 DIAGNOSIS — Z029 Encounter for administrative examinations, unspecified: Secondary | ICD-10-CM

## 2024-02-11 NOTE — Telephone Encounter (Signed)
 Left patient vm to return call regarding late arrival this morning to office.  Can transfer patient through to office.

## 2024-02-12 NOTE — Progress Notes (Signed)
 Appointment cancelled. No provider services rendered.

## 2024-02-17 ENCOUNTER — Other Ambulatory Visit (HOSPITAL_BASED_OUTPATIENT_CLINIC_OR_DEPARTMENT_OTHER): Payer: Self-pay

## 2024-02-18 ENCOUNTER — Other Ambulatory Visit (HOSPITAL_BASED_OUTPATIENT_CLINIC_OR_DEPARTMENT_OTHER): Payer: Self-pay

## 2024-02-23 ENCOUNTER — Encounter: Payer: Self-pay | Admitting: Internal Medicine

## 2024-02-23 ENCOUNTER — Other Ambulatory Visit (HOSPITAL_BASED_OUTPATIENT_CLINIC_OR_DEPARTMENT_OTHER): Payer: Self-pay

## 2024-02-23 ENCOUNTER — Encounter: Payer: Self-pay | Admitting: Family

## 2024-02-23 ENCOUNTER — Ambulatory Visit: Admitting: Internal Medicine

## 2024-02-23 VITALS — BP 100/60 | HR 77 | Temp 98.0°F | Ht 64.0 in | Wt 145.4 lb

## 2024-02-23 DIAGNOSIS — J320 Chronic maxillary sinusitis: Secondary | ICD-10-CM | POA: Insufficient documentation

## 2024-02-23 DIAGNOSIS — R430 Anosmia: Secondary | ICD-10-CM

## 2024-02-23 DIAGNOSIS — F4310 Post-traumatic stress disorder, unspecified: Secondary | ICD-10-CM | POA: Diagnosis not present

## 2024-02-23 DIAGNOSIS — J324 Chronic pansinusitis: Secondary | ICD-10-CM

## 2024-02-23 DIAGNOSIS — B3731 Acute candidiasis of vulva and vagina: Secondary | ICD-10-CM

## 2024-02-23 DIAGNOSIS — F418 Other specified anxiety disorders: Secondary | ICD-10-CM | POA: Diagnosis not present

## 2024-02-23 MED ORDER — FLUCONAZOLE 150 MG PO TABS
150.0000 mg | ORAL_TABLET | ORAL | 0 refills | Status: AC | PRN
  Filled 2024-02-23: qty 2, 6d supply, fill #0

## 2024-02-23 MED ORDER — PSEUDOEPHEDRINE HCL 15 MG/5ML PO LIQD
15.0000 mg | Freq: Four times a day (QID) | ORAL | 0 refills | Status: AC | PRN
  Filled 2024-02-23: qty 118, 6d supply, fill #0

## 2024-02-23 MED ORDER — LEVOCETIRIZINE DIHYDROCHLORIDE 5 MG PO TABS
5.0000 mg | ORAL_TABLET | Freq: Every evening | ORAL | 3 refills | Status: AC
  Filled 2024-02-23: qty 90, 90d supply, fill #0

## 2024-02-23 MED ORDER — AMOXICILLIN-POT CLAVULANATE 600-42.9 MG/5ML PO SUSR
8.3000 mL | Freq: Two times a day (BID) | ORAL | 0 refills | Status: AC
  Filled 2024-02-23: qty 225, 10d supply, fill #0

## 2024-02-23 MED ORDER — ESCITALOPRAM OXALATE 10 MG PO TABS
10.0000 mg | ORAL_TABLET | Freq: Every day | ORAL | 4 refills | Status: AC
  Filled 2024-02-23: qty 90, 90d supply, fill #0

## 2024-02-23 NOTE — Assessment & Plan Note (Signed)
 Currently managed with Lexapro  10 mg daily, with no significant side effects reported. Although a dose adjustment was discussed, the decision is to maintain the current dose. The importance of not abruptly discontinuing the medication to avoid withdrawal symptoms is emphasized. A 90-day supply of Lexapro  is provided to ensure continuity of treatment.

## 2024-02-23 NOTE — Progress Notes (Signed)
 ==============================  [facility]: [phone]   -- Medical Office Visit --  Patient: [name]      Age: 49 y.o.       Sex:  female  Date:   02/23/2024 Today's Healthcare Provider: Bernardino KANDICE Cone, MD  ==============================   Chief Complaint: Diabetes Discussed the use of AI scribe software for clinical note transcription with the patient, who gave verbal consent to proceed. History of Present Illness 49 year old female with chronic sinusitis who presents with persistent nasal congestion.  She experiences persistent nasal congestion despite completing a course of antibiotics. She reports persistent nasal congestion and difficulty breathing. She uses various sinus rinses and sprays, including a spray bottle and a product called Ocean, to manage her symptoms.  She has been unable to schedule a CT scan due to communication issues with the imaging center, despite multiple attempts to contact them.  Her past medical history includes chronic sinusitis with poor sinus drainage, attributed to narrow sinus drainage ducts and ear canals. She recalls having similar issues since childhood, possibly related to ear tube surgeries when she was younger.  Current medications include Lexapro  10 mg daily and Claritin . She has been prescribed Augmentin  in the past. She is frustrated with the ongoing congestion, noting that it impacts her ability to breathe and affects her voice, making her sound grumpy or sarcastic to others.  No recent improvement in symptoms following antibiotic treatment.  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 Past Medical History:  has a past medical history of Anemia, Asthma, Blood transfusion without reported diagnosis, Diverticulosis, Gestational diabetes, Postoperative anemia due to acute blood loss (10/09/2021), and Type 2 diabetes mellitus (HCC). Past Surgical History:   has a past surgical history that includes arm surgery; Dilation and curettage of uterus; Cesarean section; Tooth extraction; Cervical cerclage; IR Radiologist Eval & Mgmt (09/04/2021); and Hysterectomy abdominal with salpingectomy (Bilateral, 10/08/2021). Social History:   reports that she has never smoked. She has never used smokeless tobacco. She reports that she does not drink alcohol and does not use drugs. Family History:  family history includes Breast cancer in her maternal aunt; Hypertension in her brother, maternal uncle, and mother. Allergies:  is allergic to shellfish allergy, iodinated contrast media, other, and latex.   Medication Reconciliation: Current Outpatient Medications on File Prior to Visit  Medication Sig   Accu-Chek Softclix Lancets lancets Use as instructed   albuterol  (VENTOLIN  HFA) 108 (90 Base) MCG/ACT inhaler Inhale 2 puffs into the lungs every 6 (six) hours as needed for wheezing or shortness of breath.   amphetamine -dextroamphetamine  (ADDERALL XR) 5 MG 24 hr capsule Take 1 capsule (5 mg total) by mouth daily.   amphetamine -dextroamphetamine  (ADDERALL XR) 5 MG 24 hr capsule Take 1 capsule (5 mg total) by mouth daily. (Patient not taking: Reported on 11/01/2023)   Blood Glucose Monitoring Suppl  (ACCU-CHEK GUIDE) w/Device KIT Use to test blood sugar daily  Calcium Carb-Cholecalciferol  (CALCIUM + D3 PO) Take 1 tablet by mouth daily. When able to remember (Patient not taking: Reported on 11/01/2023)   Cholecalciferol  (VITAMIN D3) 125 MCG (5000 UT) CAPS Take 1 capsule (5,000 Units total) by mouth daily. (Patient not taking: Reported on 11/01/2023)   ciclopirox  (LOPROX ) 0.77 % cream Apply 1 Application topically 2 (two) times daily.   ciclopirox  (PENLAC ) 8 % solution Apply topically at bedtime. Apply over nail and surrounding skin. Apply daily over previous coat. After seven (7) days, may remove with alcohol and continue cycle.   clindamycin  (CLINDAGEL ) 1 % gel Apply topically 2 (two) times daily.   Cyanocobalamin  (B-12) 1000 MCG TBCR Take 1,000 mcg by mouth daily. When able to remember   dapagliflozin  propanediol (FARXIGA ) 5 MG TABS tablet Take 1 tablet (5 mg total) by mouth daily before breakfast. (Patient not taking: Reported on 11/01/2023)   famotidine  (PEPCID ) 20 MG tablet Take 1 tablet (20 mg total) by mouth 2 (two) times daily.   fluticasone  (FLONASE ) 50 MCG/ACT nasal spray Place 1 spray into both nostrils in the morning and at bedtime. (Patient not taking: Reported on 11/01/2023)   fluticasone  (FLONASE ) 50 MCG/ACT nasal spray Place 2 sprays into both nostrils daily.   glucose blood (ACCU-CHEK GUIDE TEST) test strip Use to test blood sugar daily   glucose blood (ACCU-CHEK GUIDE) test strip daily   glucose blood test strip Use as instructed   Lancets (FREESTYLE) lancets Use as instructed (Patient taking differently: 1 each by Other route as needed. Use as instructed Using only PRN)   levocetirizine (XYZAL ) 5 MG tablet Take 1 tablet (5 mg total) by mouth every evening.   loratadine  (CLARITIN ) 10 MG tablet Take 1 tablet (10 mg total) by mouth daily.   Multiple Vitamins-Minerals (AZO HORMONAL HEALTH CYCLE CARE) TABS Take by mouth. (Patient not taking: Reported on 11/01/2023)    nystatin -triamcinolone  ointment (MYCOLOG) Apply 1 Application topically 2 (two) times daily.   ondansetron  (ZOFRAN -ODT) 4 MG disintegrating tablet Dissolve 1 tablet under the tongue every 8 (eight) hours as needed for nausea or vomiting.   pseudoephedrine  (SUDAFED) 120 MG 12 hr tablet Take 1 tablet (120 mg total) by mouth 2 (two) times daily.   Saline (SIMPLY SALINE) 0.9 % AERS Place 1 spray into the nose daily at 6 (six) AM. (Patient not taking: Reported on 11/01/2023)   Saline (SIMPLY SALINE) 0.9 % AERS Place 2 each into the nose as directed. Use nightly for sinus hygiene long-term.  Can also be used as many times daily as desired to assist with clearing congested sinuses.   Semaglutide ,0.25 or 0.5MG /DOS, (OZEMPIC , 0.25 OR 0.5 MG/DOSE,) 2 MG/3ML SOPN Inject 0.5 mg into the skin once a week.   triamcinolone  cream (KENALOG ) 0.1 % Apply 1 Application topically 2 (two) times daily.   Vitamin D , Ergocalciferol , (DRISDOL ) 1.25 MG (50000 UNIT) CAPS capsule Take 1 capsule (50,000 Units total) by mouth every 7 (seven) days.   Vitamin D -Vitamin K (VITAMIN K2 -VITAMIN D3) 90-125 MCG CAPS Take 1 tablet by mouth daily at 6 (six) AM.   No current facility-administered medications on file prior to visit.   Medications Discontinued During This Encounter  Medication Reason   escitalopram  (LEXAPRO ) 10 MG tablet Reorder     Physical Exam:    02/23/2024    3:43 PM 02/11/2024    8:02 AM 12/27/2023   12:06 PM  Vitals with BMI  Height 5' 4 5' 4   Weight 145 lbs 6 oz  148 lbs  11 oz  BMI 24.95  25.51  Systolic 100  133  Diastolic 60  69  Pulse 77  76  Vital signs reviewed.  Nursing notes reviewed. Weight trend reviewed. Physical Activity: Not on file   General Appearance:  No acute distress appreciable.   Well-groomed, healthy-appearing female.  Well proportioned with no abnormal fat distribution.  Good muscle tone. Pulmonary:  Normal work of breathing at rest, no respiratory distress apparent. SpO2: 98 %   Musculoskeletal: All extremities are intact.  Neurological:  Awake, alert, oriented, and engaged.  No obvious focal neurological deficits or cognitive impairments.  Sensorium seems unclouded.   Speech is clear and coherent with logical content. Psychiatric:  Appropriate mood, pleasant and cooperative demeanor, thoughtful and engaged during the exam Verbalized to patient: Physical Exam HEENT: Nose straight, no deformity. Nasal voice, possible nasal obstruction.     02/23/2024    4:13 PM 12/27/2023   12:17 PM 10/07/2023   10:31 AM 09/24/2023   10:46 AM  PHQ 2/9 Scores  PHQ - 2 Score 5 6 4 1   PHQ- 9 Score 15  21       Data saved with a previous flowsheet row definition   Appointment on 12/27/2023  Component Date Value Ref Range Status   Folate 12/27/2023 12.8  >5.9 ng/mL Final   Vitamin B-12 12/27/2023 1,680 (H)  180 - 914 pg/mL Final   Ferritin 12/27/2023 174  11 - 307 ng/mL Final   Iron  12/27/2023 57  28 - 170 ug/dL Final   TIBC 88/75/7974 266  250 - 450 ug/dL Final   Saturation Ratios 12/27/2023 21  10.4 - 31.8 % Final   UIBC 12/27/2023 209  ug/dL Final   WBC Count 88/75/7974 4.1  4.0 - 10.5 K/uL Final   RBC 12/27/2023 4.66  3.87 - 5.11 MIL/uL Final   Hemoglobin 12/27/2023 13.5  12.0 - 15.0 g/dL Final   HCT 88/75/7974 40.0  36.0 - 46.0 % Final   MCV 12/27/2023 85.8  80.0 - 100.0 fL Final   MCH 12/27/2023 29.0  26.0 - 34.0 pg Final   MCHC 12/27/2023 33.8  30.0 - 36.0 g/dL Final   RDW 88/75/7974 13.1  11.5 - 15.5 % Final   Platelet Count 12/27/2023 198  150 - 400 K/uL Final   nRBC 12/27/2023 0.0  0.0 - 0.2 % Final   Neutrophils Relative % 12/27/2023 40  % Final   Neutro Abs 12/27/2023 1.6 (L)  1.7 - 7.7 K/uL Final   Lymphocytes Relative 12/27/2023 47  % Final   Lymphs Abs 12/27/2023 1.9  0.7 - 4.0 K/uL Final   Monocytes Relative 12/27/2023 7  % Final   Monocytes Absolute 12/27/2023 0.3  0.1 - 1.0 K/uL Final   Eosinophils Relative 12/27/2023 5  % Final   Eosinophils Absolute  12/27/2023 0.2  0.0 - 0.5 K/uL Final   Basophils Relative 12/27/2023 1  % Final   Basophils Absolute 12/27/2023 0.1  0.0 - 0.1 K/uL Final   Immature Granulocytes 12/27/2023 0  % Final   Abs Immature Granulocytes 12/27/2023 0.00  0.00 - 0.07 K/uL Final  Office Visit on 11/05/2023  Component Date Value Ref Range Status   WBC 11/05/2023 3.6 (L)  4.0 - 10.5 K/uL Final   RBC 11/05/2023 4.42  3.87 - 5.11 Mil/uL Final   Hemoglobin 11/05/2023 12.8  12.0 - 15.0 g/dL Final   HCT 89/96/7974 37.7  36.0 - 46.0 % Final   MCV 11/05/2023 85.4  78.0 - 100.0  fl Final   MCHC 11/05/2023 34.0  30.0 - 36.0 g/dL Final   RDW 89/96/7974 13.8  11.5 - 15.5 % Final   Platelets 11/05/2023 211.0  150.0 - 400.0 K/uL Final   Neutrophils Relative % 11/05/2023 33.2 (L)  43.0 - 77.0 % Final   Lymphocytes Relative 11/05/2023 50.8 (H)  12.0 - 46.0 % Final   Monocytes Relative 11/05/2023 7.0  3.0 - 12.0 % Final   Eosinophils Relative 11/05/2023 7.2 (H)  0.0 - 5.0 % Final   Basophils Relative 11/05/2023 1.8  0.0 - 3.0 % Final   Neutro Abs 11/05/2023 1.2 (L)  1.4 - 7.7 K/uL Final   Lymphs Abs 11/05/2023 1.8  0.7 - 4.0 K/uL Final   Monocytes Absolute 11/05/2023 0.3  0.1 - 1.0 K/uL Final   Eosinophils Absolute 11/05/2023 0.3  0.0 - 0.7 K/uL Final   Basophils Absolute 11/05/2023 0.1  0.0 - 0.1 K/uL Final   Sodium 11/05/2023 140  135 - 145 mEq/L Final   Potassium 11/05/2023 4.2  3.5 - 5.1 mEq/L Final   Chloride 11/05/2023 103  96 - 112 mEq/L Final   CO2 11/05/2023 33 (H)  19 - 32 mEq/L Final   Glucose, Bld 11/05/2023 84  70 - 99 mg/dL Final   BUN 89/96/7974 10  6 - 23 mg/dL Final   Creatinine, Ser 11/05/2023 0.58  0.40 - 1.20 mg/dL Final   Total Bilirubin 11/05/2023 1.5 (H)  0.2 - 1.2 mg/dL Final   Alkaline Phosphatase 11/05/2023 36 (L)  39 - 117 U/L Final   AST 11/05/2023 14  0 - 37 U/L Final   ALT 11/05/2023 9  0 - 35 U/L Final   Total Protein 11/05/2023 6.4  6.0 - 8.3 g/dL Final   Albumin  11/05/2023 4.0  3.5 - 5.2 g/dL  Final   GFR 89/96/7974 105.80  >60.00 mL/min Final   Calcium 11/05/2023 9.2  8.4 - 10.5 mg/dL Final   Cholesterol 89/96/7974 135  0 - 200 mg/dL Final   Triglycerides 89/96/7974 54.0  0.0 - 149.0 mg/dL Final   HDL 89/96/7974 46.80  >39.00 mg/dL Final   VLDL 89/96/7974 10.8  0.0 - 40.0 mg/dL Final   LDL Cholesterol 11/05/2023 78  0 - 99 mg/dL Final   Total CHOL/HDL Ratio 11/05/2023 3   Final   NonHDL 11/05/2023 88.33   Final   Hgb A1c MFr Bld 11/05/2023 5.8  4.6 - 6.5 % Final   VITD 11/05/2023 42.58  30.00 - 100.00 ng/mL Final  Office Visit on 11/01/2023  Component Date Value Ref Range Status   WBC 11/01/2023 3.7 (L)  4.0 - 10.5 K/uL Final   RBC 11/01/2023 4.63  3.87 - 5.11 Mil/uL Final   Hemoglobin 11/01/2023 13.3  12.0 - 15.0 g/dL Final   HCT 90/70/7974 39.7  36.0 - 46.0 % Final   MCV 11/01/2023 85.8  78.0 - 100.0 fl Final   MCHC 11/01/2023 33.5  30.0 - 36.0 g/dL Final   RDW 90/70/7974 13.8  11.5 - 15.5 % Final   Platelets 11/01/2023 220.0  150.0 - 400.0 K/uL Final   Neutrophils Relative % 11/01/2023 36.9 (L)  43.0 - 77.0 % Final   Lymphocytes Relative 11/01/2023 47.3 (H)  12.0 - 46.0 % Final   Monocytes Relative 11/01/2023 7.8  3.0 - 12.0 % Final   Eosinophils Relative 11/01/2023 6.5 (H)  0.0 - 5.0 % Final   Basophils Relative 11/01/2023 1.5  0.0 - 3.0 % Final   Neutro Abs 11/01/2023 1.4  1.4 - 7.7 K/uL Final   Lymphs Abs 11/01/2023 1.8  0.7 - 4.0 K/uL Final   Monocytes Absolute 11/01/2023 0.3  0.1 - 1.0 K/uL Final   Eosinophils Absolute 11/01/2023 0.2  0.0 - 0.7 K/uL Final   Basophils Absolute 11/01/2023 0.1  0.0 - 0.1 K/uL Final  Office Visit on 10/15/2023  Component Date Value Ref Range Status   SARS Coronavirus 2 Ag 10/15/2023 Negative  Negative Final  Office Visit on 09/24/2023  Component Date Value Ref Range Status   Color, Urine 09/24/2023 YELLOW  YELLOW Final   APPearance 09/24/2023 CLEAR  CLEAR Final   Specific Gravity, Urine 09/24/2023 1.025  1.001 - 1.035 Final    pH 09/24/2023 5.5  5.0 - 8.0 Final   Glucose, UA 09/24/2023 3+ (A)  NEGATIVE Final   Bilirubin Urine 09/24/2023 NEGATIVE  NEGATIVE Final   Ketones, ur 09/24/2023 NEGATIVE  NEGATIVE Final   Hgb urine dipstick 09/24/2023 NEGATIVE  NEGATIVE Final   Protein, ur 09/24/2023 NEGATIVE  NEGATIVE Final   Nitrite 09/24/2023 NEGATIVE  NEGATIVE Final   Leukocytes,Ua 09/24/2023 NEGATIVE  NEGATIVE Final   MICRO NUMBER: 09/24/2023 [id number]   Final   SPECIMEN QUALITY: 09/24/2023 Adequate   Final   Sample Source 09/24/2023 URINE   Final   STATUS: 09/24/2023 FINAL   Final   Result: 09/24/2023 No Growth   Final  Lab on 08/24/2023  Component Date Value Ref Range Status   Cholesterol 08/24/2023 159  0 - 200 mg/dL Final   Triglycerides 92/77/7974 62.0  0.0 - 149.0 mg/dL Final   HDL 92/77/7974 46.10  >39.00 mg/dL Final   VLDL 92/77/7974 12.4  0.0 - 40.0 mg/dL Final   LDL Cholesterol 08/24/2023 100 (H)  0 - 99 mg/dL Final   Total CHOL/HDL Ratio 08/24/2023 3   Final   NonHDL 08/24/2023 112.55   Final   Sodium 08/24/2023 138  135 - 145 mEq/L Final   Potassium 08/24/2023 3.7  3.5 - 5.1 mEq/L Final   Chloride 08/24/2023 103  96 - 112 mEq/L Final   CO2 08/24/2023 29  19 - 32 mEq/L Final   Glucose, Bld 08/24/2023 83  70 - 99 mg/dL Final   BUN 92/77/7974 14  6 - 23 mg/dL Final   Creatinine, Ser 08/24/2023 0.69  0.40 - 1.20 mg/dL Final   Total Bilirubin 08/24/2023 1.6 (H)  0.2 - 1.2 mg/dL Final   Alkaline Phosphatase 08/24/2023 40  39 - 117 U/L Final   AST 08/24/2023 15  0 - 37 U/L Final   ALT 08/24/2023 8  0 - 35 U/L Final   Total Protein 08/24/2023 7.3  6.0 - 8.3 g/dL Final   Albumin  08/24/2023 4.4  3.5 - 5.2 g/dL Final   GFR 92/77/7974 101.60  >60.00 mL/min Final   Calcium 08/24/2023 9.2  8.4 - 10.5 mg/dL Final   Microalb, Ur 92/77/7974 0.7  0.0 - 1.9 mg/dL Final   Creatinine,U 92/77/7974 142.9  mg/dL Final   Microalb Creat Ratio 08/24/2023 4.9  0.0 - 30.0 mg/g Final   WBC 08/24/2023 4.2  4.0 - 10.5 K/uL  Final   RBC 08/24/2023 4.90  3.87 - 5.11 Mil/uL Final   Hemoglobin 08/24/2023 14.2  12.0 - 15.0 g/dL Final   HCT 92/77/7974 42.1  36.0 - 46.0 % Final   MCV 08/24/2023 85.8  78.0 - 100.0 fl Final   MCHC 08/24/2023 33.7  30.0 - 36.0 g/dL Final   RDW 92/77/7974 13.5  11.5 - 15.5 % Final  Platelets 08/24/2023 208.0  150.0 - 400.0 K/uL Final   Neutrophils Relative % 08/24/2023 37.3 (L)  43.0 - 77.0 % Final   Lymphocytes Relative 08/24/2023 45.9  12.0 - 46.0 % Final   Monocytes Relative 08/24/2023 8.0  3.0 - 12.0 % Final   Eosinophils Relative 08/24/2023 6.7 (H)  0.0 - 5.0 % Final   Basophils Relative 08/24/2023 2.1  0.0 - 3.0 % Final   Neutro Abs 08/24/2023 1.6  1.4 - 7.7 K/uL Final   Lymphs Abs 08/24/2023 1.9  0.7 - 4.0 K/uL Final   Monocytes Absolute 08/24/2023 0.3  0.1 - 1.0 K/uL Final   Eosinophils Absolute 08/24/2023 0.3  0.0 - 0.7 K/uL Final   Basophils Absolute 08/24/2023 0.1  0.0 - 0.1 K/uL Final  Orders Only on 08/11/2023  Component Date Value Ref Range Status   Hemoglobin A1C 08/11/2023 6.5 (A)  4.0 - 5.6 % Final  Office Visit on 06/09/2023  Component Date Value Ref Range Status   Color, UA 06/09/2023 yellow   Final   Clarity, UA 06/09/2023 clear   Final   Glucose, UA 06/09/2023 Positive (A)  Negative Final   Bilirubin, UA 06/09/2023 negative   Final   Ketones, UA 06/09/2023 +-5   Final   Spec Grav, UA 06/09/2023 1.025  1.010 - 1.025 Final   Blood, UA 06/09/2023 Negative   Final   pH, UA 06/09/2023 5.0  5.0 - 8.0 Final   Protein, UA 06/09/2023 Negative  Negative Final   Urobilinogen, UA 06/09/2023 0.2  0.2 or 1.0 E.U./dL Final   Nitrite, UA 94/92/7974 negative   Final   Leukocytes, UA 06/09/2023 Negative  Negative Final   Odor 06/09/2023 negative   Final  Office Visit on 05/10/2023  Component Date Value Ref Range Status   WBC 05/10/2023 4.4  4.0 - 10.5 K/uL Final   RBC 05/10/2023 5.12 (H)  3.87 - 5.11 Mil/uL Final   Hemoglobin 05/10/2023 15.1 (H)  12.0 - 15.0 g/dL Final    HCT 95/92/7974 44.2  36.0 - 46.0 % Final   MCV 05/10/2023 86.3  78.0 - 100.0 fl Final   MCHC 05/10/2023 34.2  30.0 - 36.0 g/dL Final   RDW 95/92/7974 13.6  11.5 - 15.5 % Final   Platelets 05/10/2023 205.0  150.0 - 400.0 K/uL Final   Neutrophils Relative % 05/10/2023 38.6 (L)  43.0 - 77.0 % Final   Lymphocytes Relative 05/10/2023 44.6  12.0 - 46.0 % Final   Monocytes Relative 05/10/2023 7.3  3.0 - 12.0 % Final   Eosinophils Relative 05/10/2023 8.0 (H)  0.0 - 5.0 % Final   Basophils Relative 05/10/2023 1.5  0.0 - 3.0 % Final   Neutro Abs 05/10/2023 1.7  1.4 - 7.7 K/uL Final   Lymphs Abs 05/10/2023 2.0  0.7 - 4.0 K/uL Final   Monocytes Absolute 05/10/2023 0.3  0.1 - 1.0 K/uL Final   Eosinophils Absolute 05/10/2023 0.3  0.0 - 0.7 K/uL Final   Basophils Absolute 05/10/2023 0.1  0.0 - 0.1 K/uL Final   Sodium 05/10/2023 138  135 - 145 mEq/L Final   Potassium 05/10/2023 4.3  3.5 - 5.1 mEq/L Final   Chloride 05/10/2023 100  96 - 112 mEq/L Final   CO2 05/10/2023 29  19 - 32 mEq/L Final   Glucose, Bld 05/10/2023 347 (H)  70 - 99 mg/dL Final   BUN 95/92/7974 15  6 - 23 mg/dL Final   Creatinine, Ser 05/10/2023 0.77  0.40 - 1.20 mg/dL Final  Total Bilirubin 05/10/2023 1.3 (H)  0.2 - 1.2 mg/dL Final   Alkaline Phosphatase 05/10/2023 61  39 - 117 U/L Final   AST 05/10/2023 12  0 - 37 U/L Final   ALT 05/10/2023 11  0 - 35 U/L Final   Total Protein 05/10/2023 7.5  6.0 - 8.3 g/dL Final   Albumin  05/10/2023 4.5  3.5 - 5.2 g/dL Final   GFR 95/92/7974 90.49  >60.00 mL/min Final   Calcium 05/10/2023 9.7  8.4 - 10.5 mg/dL Final   Cholesterol 95/92/7974 157  0 - 200 mg/dL Final   Triglycerides 95/92/7974 85.0  0.0 - 149.0 mg/dL Final   HDL 95/92/7974 47.60  >39.00 mg/dL Final   VLDL 95/92/7974 17.0  0.0 - 40.0 mg/dL Final   LDL Cholesterol 05/10/2023 93  0 - 99 mg/dL Final   Total CHOL/HDL Ratio 05/10/2023 3   Final   NonHDL 05/10/2023 109.51   Final   Hgb A1c MFr Bld 05/10/2023 11.4 (H)  4.6 - 6.5 %  Final   VITD 05/10/2023 29.53 (L)  30.00 - 100.00 ng/mL Final   Vitamin B-12 05/10/2023 >1537 (H)  211 - 911 pg/mL Final   Folate 05/10/2023 >25.2  >5.9 ng/mL Final   Microalb, Ur 05/10/2023 1.0  0.0 - 1.9 mg/dL Final   Creatinine,U 95/92/7974 111.8  mg/dL Final   Microalb Creat Ratio 05/10/2023 9.1  0.0 - 30.0 mg/g Final   Color, Urine 05/10/2023 YELLOW  YELLOW Final   APPearance 05/10/2023 CLEAR  CLEAR Final   Specific Gravity, Urine 05/10/2023 1.039 (H)  1.001 - 1.035 Final   pH 05/10/2023 < OR = 5.0  5.0 - 8.0 Final   Glucose, UA 05/10/2023 3+ (A)  NEGATIVE Final   Bilirubin Urine 05/10/2023 NEGATIVE  NEGATIVE Final   Ketones, ur 05/10/2023 3+ (A)  NEGATIVE Final   Hgb urine dipstick 05/10/2023 NEGATIVE  NEGATIVE Final   Protein, ur 05/10/2023 NEGATIVE  NEGATIVE Final   Nitrites, Initial 05/10/2023 NEGATIVE  NEGATIVE Final   Leukocyte Esterase 05/10/2023 NEGATIVE  NEGATIVE Final   WBC, UA 05/10/2023 0-5  0 - 5 /HPF Final   RBC / HPF 05/10/2023 NONE SEEN  0 - 2 /HPF Final   Squamous Epithelial / HPF 05/10/2023 0-5  < OR = 5 /HPF Final   Bacteria, UA 05/10/2023 NONE SEEN  NONE SEEN /HPF Final   Hyaline Cast 05/10/2023 NONE SEEN  NONE SEEN /LPF Final   Note 05/10/2023    Final   Reflexve Urine Culture 05/10/2023    Final  Office Visit on 03/17/2023  Component Date Value Ref Range Status   Microalb, Ur 03/17/2023 <0.7  0.0 - 1.9 mg/dL Final   Creatinine,U 97/87/7974 97.5  mg/dL Final   Microalb Creat Ratio 03/17/2023 7.2  0.0 - 30.0 mg/g Final  There may be more visits with results that are not included.      ASSESSMENT & PLAN   Assessment & Plan Chronic maxillary sinusitis Anosmia Chronic pansinusitis Chronic pansinusitis with anosmia   Persistent nasal congestion and anosmia continue despite previous antibiotic treatment, likely due to narrow sinus drainage ducts and possible chronic sinusitis. Differential diagnosis includes sinus polyps or other obstructions. A CT scan is  ordered to evaluate the need for surgical intervention. Non-surgical management involves sinus rinses and nasal maneuvers to improve drainage. Augmentin  is prescribed for 10 days to address potential bacterial infection, with Diflucan  to prevent a yeast infection from the antibiotic. Instructions on sinus rinse techniques and nasal maneuvers are  provided. A trial of Sudafed is recommended to enhance nasal drainage. Allergy control is adjusted by switching from Claritin  to Xyzal . If the CT scan indicates significant obstruction, septoplasty may be necessary. It is explained that antibiotics may not reach the infection site due to blockage, and sinus rinses and maneuvers may help avoid surgery.  Dr. Roark ordered CT but per patient request will order again due to imaging facility hasn't returned scheduling call- she will also call Dr. Roark. PTSD (post-traumatic stress disorder) Mixed anxiety and depressive disorder Currently managed with Lexapro  10 mg daily, with no significant side effects reported. Although a dose adjustment was discussed, the decision is to maintain the current dose. The importance of not abruptly discontinuing the medication to avoid withdrawal symptoms is emphasized. A 90-day supply of Lexapro  is provided to ensure continuity of treatment. Vaginal candidiasis After antibiotic(s).  ORDER ASSOCIATIONS  #   DIAGNOSIS / CONDITION ICD-10 ENCOUNTER ORDER     ICD-10-CM   1. Chronic maxillary sinusitis  J32.0 CT MAXILLOFACIAL W CONTRAST    amoxicillin -clavulanate (AUGMENTIN ) 600-42.9 MG/5ML suspension    levocetirizine (XYZAL ) 5 MG tablet    2. Chronic pansinusitis  J32.4     3. PTSD (post-traumatic stress disorder)  F43.10 escitalopram  (LEXAPRO ) 10 MG tablet    pseudoephedrine  (SUDAFED) 15 MG/5ML liquid    4. Mixed anxiety and depressive disorder  F41.8 escitalopram  (LEXAPRO ) 10 MG tablet    5. Anosmia  R43.0 CT MAXILLOFACIAL W CONTRAST    6. Vaginal candidiasis  B37.31  fluconazole  (DIFLUCAN ) 150 MG tablet         Orders Placed in Encounter:   Imaging Orders         CT MAXILLOFACIAL W CONTRAST    Meds ordered this encounter  Medications   escitalopram  (LEXAPRO ) 10 MG tablet    Sig: Take 1 tablet (10 mg total) by mouth daily. Take one-half tablet for first 2 weeks to start.    Dispense:  90 tablet    Refill:  4   amoxicillin -clavulanate (AUGMENTIN ) 600-42.9 MG/5ML suspension    Sig: Take 8.3 mLs by mouth 2 (two) times daily for 10 days. Discard remainder.    Dispense:  225 mL    Refill:  0    Spoke to provider on [facility] - he wants patient to receive 1000 mg per DOSE. Changing to 8.3 ML BID.   levocetirizine (XYZAL ) 5 MG tablet    Sig: Take 1 tablet (5 mg total) by mouth every evening.    Dispense:  90 tablet    Refill:  3   pseudoephedrine  (SUDAFED) 15 MG/5ML liquid    Sig: Take 5 mLs (15 mg total) by mouth every 6 (six) hours as needed for congestion.    Dispense:  118 mL    Refill:  0   fluconazole  (DIFLUCAN ) 150 MG tablet    Sig: Take 1 tablet (150 mg total) by mouth every three (3) days as needed.    Dispense:  2 tablet    Refill:  0   Orders Placed This Encounter  Procedures   CT MAXILLOFACIAL W CONTRAST    Standing Status:   Future    Expiration Date:   02/22/2025    Scheduling Instructions:     Share results to [facility].    If indicated for the ordered procedure, I authorize the administration of contrast media per Radiology protocol:   Yes    Is patient pregnant?:   No    Preferred imaging location?:  [facility]   Medical Decision Making: 2 or more stable chronic illnesses Prescription drug management      This document was synthesized by artificial intelligence ([employer]bridge) using HIPAA-compliant recording of the clinical interaction;   We discussed the use of AI scribe software for clinical note transcription with the patient, who gave verbal consent to proceed. additional Info: This encounter employed state-of-the-art,  real-time, collaborative documentation. The patient actively reviewed and assisted in updating their electronic medical record on a shared screen, ensuring transparency and facilitating joint problem-solving for the problem list, overview, and plan. This approach promotes accurate, informed care. The treatment plan was discussed and reviewed in detail, including medication safety, potential side effects, and all patient questions. We confirmed understanding and comfort with the plan. Follow-up instructions were established, including contacting the office for any concerns, returning if symptoms worsen, persist, or new symptoms develop, and precautions for potential emergency department visits.

## 2024-02-23 NOTE — Assessment & Plan Note (Addendum)
 Chronic pansinusitis with anosmia   Persistent nasal congestion and anosmia continue despite previous antibiotic treatment, likely due to narrow sinus drainage ducts and possible chronic sinusitis. Differential diagnosis includes sinus polyps or other obstructions. A CT scan is ordered to evaluate the need for surgical intervention. Non-surgical management involves sinus rinses and nasal maneuvers to improve drainage. Augmentin  is prescribed for 10 days to address potential bacterial infection, with Diflucan  to prevent a yeast infection from the antibiotic. Instructions on sinus rinse techniques and nasal maneuvers are provided. A trial of Sudafed is recommended to enhance nasal drainage. Allergy control is adjusted by switching from Claritin  to Xyzal . If the CT scan indicates significant obstruction, septoplasty may be necessary. It is explained that antibiotics may not reach the infection site due to blockage, and sinus rinses and maneuvers may help avoid surgery.  Dr. Roark ordered CT but per patient request will order again due to imaging facility hasn't returned scheduling call- she will also call Dr. Roark.

## 2024-02-23 NOTE — Patient Instructions (Addendum)
 It was a pleasure seeing you today! Your health and satisfaction are our top priorities.  Bernardino Cone, MD  VISIT SUMMARY: During your visit, we discussed your persistent nasal congestion and difficulty breathing despite completing a course of antibiotics. We also reviewed your chronic sinusitis and its impact on your daily life, including your voice and overall comfort. Additionally, we addressed your mixed anxiety and depressive disorder and your current medication regimen.  YOUR PLAN: -CHRONIC PANSINUSITIS WITH ANOSMIA: Chronic pansinusitis is a long-term inflammation of all the sinus cavities, and anosmia is the loss of the sense of smell. Your persistent nasal congestion and anosmia are likely due to narrow sinus drainage ducts and possible chronic sinusitis. We have ordered a CT scan to evaluate the need for surgical intervention. In the meantime, continue using sinus rinses and nasal maneuvers to improve drainage. We have prescribed Augmentin  for 10 days to address potential bacterial infection and Diflucan  to prevent a yeast infection from the antibiotic. We also recommend trying Sudafed to enhance nasal drainage and switching from Claritin  to Xyzal  for better allergy control. If the CT scan indicates significant obstruction, septoplasty may be necessary.  -MIXED ANXIETY AND DEPRESSIVE DISORDER: Mixed anxiety and depressive disorder involves symptoms of both anxiety and depression. You are currently managing this condition with Lexapro  10 mg daily, and we have decided to maintain the current dose as you are not experiencing significant side effects. It is important not to abruptly discontinue the medication to avoid withdrawal symptoms. We have provided a 90-day supply of Lexapro  to ensure continuity of treatment.  INSTRUCTIONS: Please schedule a CT scan as soon as possible to evaluate your sinus condition. Continue using sinus rinses and nasal maneuvers as instructed. Take Augmentin  for 10 days  and Diflucan  as prescribed. Try Sudafed to help with nasal drainage and switch from Claritin  to Xyzal  for allergy control. Follow up with us  after the CT scan results are available.  Your Providers PCP: Cone Bernardino MATSU, MD,  [phone]) Referring Provider: Cone Bernardino MATSU, MD,  [phone])  NEXT STEPS: [x]  Early Intervention: Schedule sooner appointment, call our on-call services, or go to emergency room if there is any significant Increase in pain or discomfort New or worsening symptoms Sudden or severe changes in your health [x]  Flexible Follow-Up: We recommend a No follow-ups on file. for optimal routine care. This allows for progress monitoring and treatment adjustments. [x]  Preventive Care: Schedule your annual preventive care visit! It's typically covered by insurance and helps identify potential health issues early. [x]  Lab & X-ray Appointments: Incomplete tests scheduled today, or call to schedule. X-rays: [facility] (M-F, 8:30am-noon or 1pm-5pm). [x]  Medical Information Release: Sign a release form at front desk to obtain relevant medical information we don't have.  MAKING THE MOST OF OUR FOCUSED 20 MINUTE APPOINTMENTS: [x]   Clearly state your top concerns at the beginning of the visit to focus our discussion [x]   If you anticipate you will need more time, please inform the front desk during scheduling - we can book multiple appointments in the same week. [x]   If you have transportation problems- use our convenient video appointments or ask about transportation support. [x]   We can get down to business faster if you use MyChart to update information before the visit and submit non-urgent questions before your visit. Thank you for taking the time to provide details through MyChart.  Let our nurse know and she can import this information into your encounter documents.  Arrival and Wait  Times: [x]   Arriving on time ensures that everyone receives prompt  attention. [x]   Early morning (8a) and afternoon (1p) appointments tend to have shortest wait times. [x]   Unfortunately, we cannot delay appointments for late arrivals or hold slots during phone calls.  Getting Answers and Following Up [x]   Simple Questions & Concerns: For quick questions or basic follow-up after your visit, reach us  at [phone] or MyChart messaging. [x]   Complex Concerns: If your concern is more complex, scheduling an appointment might be best. Discuss this with the staff to find the most suitable option. [x]   Lab & Imaging Results: We'll contact you directly if results are abnormal or you don't use MyChart. Most normal results will be on MyChart within 2-3 business days, with a review message from Dr. Jesus. Haven't heard back in 2 weeks? Need results sooner? Contact us  at [phone]. [x]   Referrals: Our referral coordinator will manage specialist referrals. The specialist's office should contact you within 2 weeks to schedule an appointment. Call us  if you haven't heard from them after 2 weeks.  Staying Connected [x]   MyChart: Activate your MyChart for the fastest way to access results and message us . See the last page of this paperwork for instructions on how to activate.  Bring to Your Next Appointment [x]   Medications: Please bring all your medication bottles to your next appointment to ensure we have an accurate record of your prescriptions. [x]   Health Diaries: If you're monitoring any health conditions at home, keeping a diary of your readings can be very helpful for discussions at your next appointment.  Billing [x]   X-ray & Lab Orders: These are billed by separate companies. Contact the invoicing company directly for questions or concerns. [x]   Visit Charges: Discuss any billing inquiries with our administrative services team.  Your Satisfaction Matters [x]   Share Your Experience: We strive for your satisfaction! If you have any complaints, or preferably  compliments, please let Dr. Jesus know directly or contact our Practice Administrators, Manuelita Rubin or [employer], by asking at the front desk.   Reviewing Your Records [x]   Review this early draft of your clinical encounter notes below and the final encounter summary tomorrow on MyChart after its been completed.  All orders placed so far are visible here: Chronic maxillary sinusitis -     CT MAXILLOFACIAL W CONTRAST; Future -     Amoxicillin -Pot Clavulanate; Take 8.3 mLs by mouth 2 (two) times daily for 10 days. Discard remainder.  Dispense: 225 mL; Refill: 0 -     Levocetirizine Dihydrochloride ; Take 1 tablet (5 mg total) by mouth every evening.  Dispense: 90 tablet; Refill: 3 -     Pseudoephedrine  HCl; Take 5 mLs (15 mg total) by mouth every 6 (six) hours as needed for congestion.  Dispense: 118 mL; Refill: 0  Chronic pansinusitis -     CT MAXILLOFACIAL W CONTRAST; Future -     Amoxicillin -Pot Clavulanate; Take 8.3 mLs by mouth 2 (two) times daily for 10 days. Discard remainder.  Dispense: 225 mL; Refill: 0 -     Levocetirizine Dihydrochloride ; Take 1 tablet (5 mg total) by mouth every evening.  Dispense: 90 tablet; Refill: 3 -     Pseudoephedrine  HCl; Take 5 mLs (15 mg total) by mouth every 6 (six) hours as needed for congestion.  Dispense: 118 mL; Refill: 0  PTSD (post-traumatic stress disorder) -     Escitalopram  Oxalate; Take 1 tablet (10 mg total) by mouth daily. Take one-half  tablet for first 2 weeks to start.  Dispense: 90 tablet; Refill: 4  Mixed anxiety and depressive disorder -     Escitalopram  Oxalate; Take 1 tablet (10 mg total) by mouth daily. Take one-half tablet for first 2 weeks to start.  Dispense: 90 tablet; Refill: 4  Anosmia -     CT MAXILLOFACIAL W CONTRAST; Future  Vaginal candidiasis -     Fluconazole ; Take 1 tablet (150 mg total) by mouth every three (3) days as needed.  Dispense: 2 tablet; Refill: 0   ALLERGY MANAGEMENT PLAN  This plan is designed to  help manage your allergic rhinitis (nasal allergies) effectively. Follow these steps daily for best results.  Sinus saline sprays- use nightly, and after sneezing episodes or exposure to allergen.  Insert deeply and spray mist into nose while leaning over sink at 45 degrees,  while gently breathing. Also blow out onto tissue while leaning forward 45 degrees. Once daily, after a sinus rinse, use sensimist.  Just before bedtime is best. This only needed if allergies acting up.  If this is inadequate add-on once daily for levocetirizine / xyzal  5 mg for nondrowsy antihistamine Take benadryl  25 mg at bedtime also if allergic mucus is persisting  When allergies cause chronic swelling in sinuses, it leads to sinus infections:    DAILY TREATMENT ROUTINE   Time of Day Treatment Steps  Morning 1. Saline Nasal Spray - Use to cleanse nasal passages 2. Xyzal  (levocetirizine) - Take one tablet daily   Throughout Day Saline Nasal Spray - Use 2 additional times (mid-day and afternoon)   Evening/Bedtime 1. Saline Nasal Rinse - Thoroughly clean nasal passages 2. Flonase  Sensimist - Apply after nasal rinse 3. Benadryl  (diphenhydramine ) - Take 25mg  if experiencing persistent congestion    PROPER TECHNIQUE GUIDE       Saline Nasal Spray/Rinse Technique: Lean forward over sink at a 45-degree angle Turn head slightly to one side Insert spray tip into upper nostril Spray gently while breathing lightly through your nose Repeat on other side Gently blow nose to clear excess solution Use saline spray 3 times daily to keep nasal passages moist and clear allergens.       Flonase  Sensimist Technique: Shake bottle gently before each use Prime the bottle if it's new or hasn't been used for a week Tilt your head forward slightly Insert tip into nostril, pointing away from the center of your nose Spray while inhaling gently Repeat in other nostril Use Flonase  Sensimist once daily, preferably at bedtime after  using saline rinse. It may take several days of regular use to feel maximum benefit.   WHY FLONASE  SENSIMIST?   Benefits of Flonase  Sensimist:  Alcohol-free and scent-free formula - gentler on sensitive nasal passages Fine mist application - more comfortable with less dripping down throat Effectively relieves nasal congestion, sneezing, runny nose, and even eye symptoms 24-hour relief with once-daily dosing Uses a more potent form of fluticasone  that works at a lower dose Less liquid per spray means less discomfort  UNDERSTANDING YOUR MEDICATIONS   Medication How It Works Important Notes  Flonase  Sensimist (fluticasone  furoate) Reduces inflammation in nasal passages, addressing the underlying cause of allergy symptoms - Takes several days for full effect - Use daily for best results - Safe for long-term use   Xyzal  (levocetirizine) Blocks histamine to reduce allergy symptoms like sneezing and itching - Take at the same time each day - May cause drowsiness in some people - Once-daily dosing   Benadryl  (  diphenhydramine ) Antihistamine that provides additional relief for breakthrough symptoms - Causes drowsiness - Use only at bedtime - For occasional use when needed   Saline Spray/Rinse Physically removes allergens and moistens nasal passages - Safe to use frequently - Improves effectiveness of other treatments - Reduces nasal irritation    CONTACT YOUR PROVIDER IF: Your symptoms do not improve after 1-2 weeks of following this plan You develop sinus pain with fever or green/yellow discharge You experience frequent nosebleeds You develop new or worsening symptoms You have questions about your treatment plan     ADDITIONAL ALLERGY MANAGEMENT TIPS   HELPFUL STRATEGIES: ?? Keep windows closed during high pollen seasons ??? Use allergen-proof covers for pillows and mattresses ?? Vacuum regularly with a HEPA filter vacuum ?? Shower and change clothes after spending time  outdoors ?? Check local pollen counts and limit outdoor time when counts are high ?? Stay well-hydrated to help keep mucous membranes moist

## 2024-02-24 ENCOUNTER — Other Ambulatory Visit (HOSPITAL_BASED_OUTPATIENT_CLINIC_OR_DEPARTMENT_OTHER): Payer: Self-pay

## 2024-02-26 ENCOUNTER — Other Ambulatory Visit (HOSPITAL_BASED_OUTPATIENT_CLINIC_OR_DEPARTMENT_OTHER): Payer: Self-pay

## 2024-02-28 ENCOUNTER — Encounter: Payer: Self-pay | Admitting: Internal Medicine

## 2024-02-28 ENCOUNTER — Other Ambulatory Visit: Payer: Self-pay | Admitting: Internal Medicine

## 2024-02-28 ENCOUNTER — Ambulatory Visit (HOSPITAL_BASED_OUTPATIENT_CLINIC_OR_DEPARTMENT_OTHER): Admission: RE | Admit: 2024-02-28

## 2024-02-28 ENCOUNTER — Encounter: Payer: Self-pay | Admitting: Family

## 2024-02-28 ENCOUNTER — Other Ambulatory Visit (HOSPITAL_BASED_OUTPATIENT_CLINIC_OR_DEPARTMENT_OTHER): Payer: Self-pay

## 2024-02-28 DIAGNOSIS — Z91041 Radiographic dye allergy status: Secondary | ICD-10-CM

## 2024-02-28 MED ORDER — DIPHENHYDRAMINE HCL 25 MG PO TABS
50.0000 mg | ORAL_TABLET | Freq: Once | ORAL | 0 refills | Status: AC
  Filled 2024-02-28: qty 2, 1d supply, fill #0

## 2024-02-28 MED ORDER — PREDNISONE 50 MG PO TABS
50.0000 mg | ORAL_TABLET | Freq: Four times a day (QID) | ORAL | 0 refills | Status: AC
  Filled 2024-02-28: qty 3, 1d supply, fill #0

## 2024-03-10 ENCOUNTER — Other Ambulatory Visit (HOSPITAL_BASED_OUTPATIENT_CLINIC_OR_DEPARTMENT_OTHER): Payer: Self-pay

## 2024-03-20 ENCOUNTER — Ambulatory Visit: Admitting: Physician Assistant

## 2024-03-31 ENCOUNTER — Ambulatory Visit: Admitting: Internal Medicine
